# Patient Record
Sex: Female | Born: 1955 | Race: White | Hispanic: No | Marital: Married | Smoking: Former smoker
Health system: Southern US, Community
[De-identification: ages and names within clinical notes are randomized; demographics above are authoritative.]

## PROBLEM LIST (undated history)

## (undated) DIAGNOSIS — C801 Malignant (primary) neoplasm, unspecified: Secondary | ICD-10-CM

## (undated) DIAGNOSIS — Z923 Personal history of irradiation: Secondary | ICD-10-CM

## (undated) DIAGNOSIS — R002 Palpitations: Secondary | ICD-10-CM

## (undated) DIAGNOSIS — R21 Rash and other nonspecific skin eruption: Secondary | ICD-10-CM

## (undated) DIAGNOSIS — Z860100 Personal history of colon polyps, unspecified: Secondary | ICD-10-CM

## (undated) DIAGNOSIS — Z98811 Dental restoration status: Secondary | ICD-10-CM

## (undated) DIAGNOSIS — Z803 Family history of malignant neoplasm of breast: Secondary | ICD-10-CM

## (undated) DIAGNOSIS — E785 Hyperlipidemia, unspecified: Secondary | ICD-10-CM

## (undated) DIAGNOSIS — K7460 Unspecified cirrhosis of liver: Secondary | ICD-10-CM

## (undated) DIAGNOSIS — Z9109 Other allergy status, other than to drugs and biological substances: Secondary | ICD-10-CM

## (undated) DIAGNOSIS — R079 Chest pain, unspecified: Secondary | ICD-10-CM

## (undated) DIAGNOSIS — C50919 Malignant neoplasm of unspecified site of unspecified female breast: Secondary | ICD-10-CM

## (undated) DIAGNOSIS — K219 Gastro-esophageal reflux disease without esophagitis: Secondary | ICD-10-CM

## (undated) DIAGNOSIS — G8929 Other chronic pain: Secondary | ICD-10-CM

## (undated) DIAGNOSIS — K449 Diaphragmatic hernia without obstruction or gangrene: Secondary | ICD-10-CM

## (undated) DIAGNOSIS — R51 Headache: Secondary | ICD-10-CM

## (undated) DIAGNOSIS — M8580 Other specified disorders of bone density and structure, unspecified site: Secondary | ICD-10-CM

## (undated) DIAGNOSIS — M7989 Other specified soft tissue disorders: Secondary | ICD-10-CM

## (undated) DIAGNOSIS — E039 Hypothyroidism, unspecified: Secondary | ICD-10-CM

## (undated) DIAGNOSIS — F32A Depression, unspecified: Secondary | ICD-10-CM

## (undated) DIAGNOSIS — F329 Major depressive disorder, single episode, unspecified: Secondary | ICD-10-CM

## (undated) DIAGNOSIS — D693 Immune thrombocytopenic purpura: Secondary | ICD-10-CM

## (undated) DIAGNOSIS — T7840XA Allergy, unspecified, initial encounter: Secondary | ICD-10-CM

## (undated) DIAGNOSIS — K760 Fatty (change of) liver, not elsewhere classified: Secondary | ICD-10-CM

## (undated) DIAGNOSIS — Z808 Family history of malignant neoplasm of other organs or systems: Secondary | ICD-10-CM

## (undated) DIAGNOSIS — J309 Allergic rhinitis, unspecified: Secondary | ICD-10-CM

## (undated) DIAGNOSIS — Z8669 Personal history of other diseases of the nervous system and sense organs: Secondary | ICD-10-CM

## (undated) DIAGNOSIS — Z8719 Personal history of other diseases of the digestive system: Secondary | ICD-10-CM

## (undated) DIAGNOSIS — F419 Anxiety disorder, unspecified: Secondary | ICD-10-CM

## (undated) DIAGNOSIS — Z91018 Allergy to other foods: Secondary | ICD-10-CM

## (undated) DIAGNOSIS — E079 Disorder of thyroid, unspecified: Secondary | ICD-10-CM

## (undated) DIAGNOSIS — R519 Headache, unspecified: Secondary | ICD-10-CM

## (undated) DIAGNOSIS — R011 Cardiac murmur, unspecified: Secondary | ICD-10-CM

## (undated) DIAGNOSIS — M810 Age-related osteoporosis without current pathological fracture: Secondary | ICD-10-CM

## (undated) DIAGNOSIS — C50911 Malignant neoplasm of unspecified site of right female breast: Secondary | ICD-10-CM

## (undated) HISTORY — DX: Other chronic pain: G89.29

## (undated) HISTORY — DX: Palpitations: R00.2

## (undated) HISTORY — DX: Hypothyroidism, unspecified: E03.9

## (undated) HISTORY — DX: Diaphragmatic hernia without obstruction or gangrene: K44.9

## (undated) HISTORY — DX: Age-related osteoporosis without current pathological fracture: M81.0

## (undated) HISTORY — DX: Gastro-esophageal reflux disease without esophagitis: K21.9

## (undated) HISTORY — DX: Other specified soft tissue disorders: M79.89

## (undated) HISTORY — DX: Major depressive disorder, single episode, unspecified: F32.9

## (undated) HISTORY — PX: ESOPHAGOGASTRODUODENOSCOPY: SHX1529

## (undated) HISTORY — PX: COLONOSCOPY: SHX174

## (undated) HISTORY — DX: Allergy, unspecified, initial encounter: T78.40XA

## (undated) HISTORY — DX: Hyperlipidemia, unspecified: E78.5

## (undated) HISTORY — DX: Depression, unspecified: F32.A

## (undated) HISTORY — DX: Family history of malignant neoplasm of other organs or systems: Z80.8

## (undated) HISTORY — DX: Malignant (primary) neoplasm, unspecified: C80.1

## (undated) HISTORY — DX: Anxiety disorder, unspecified: F41.9

## (undated) HISTORY — DX: Disorder of thyroid, unspecified: E07.9

## (undated) HISTORY — DX: Immune thrombocytopenic purpura: D69.3

## (undated) HISTORY — DX: Headache: R51

## (undated) HISTORY — DX: Chest pain, unspecified: R07.9

## (undated) HISTORY — DX: Family history of malignant neoplasm of breast: Z80.3

## (undated) HISTORY — DX: Personal history of irradiation: Z92.3

## (undated) HISTORY — PX: COLONOSCOPY W/ POLYPECTOMY: SHX1380

## (undated) HISTORY — DX: Unspecified cirrhosis of liver: K74.60

## (undated) HISTORY — DX: Cardiac murmur, unspecified: R01.1

## (undated) HISTORY — DX: Other specified disorders of bone density and structure, unspecified site: M85.80

## (undated) HISTORY — DX: Allergy to other foods: Z91.018

## (undated) HISTORY — DX: Fatty (change of) liver, not elsewhere classified: K76.0

## (undated) HISTORY — DX: Allergic rhinitis, unspecified: J30.9

## (undated) HISTORY — DX: Headache, unspecified: R51.9

---

## 1997-08-29 ENCOUNTER — Other Ambulatory Visit: Admission: RE | Admit: 1997-08-29 | Discharge: 1997-08-29 | Payer: Self-pay | Admitting: Obstetrics and Gynecology

## 1998-12-19 ENCOUNTER — Other Ambulatory Visit: Admission: RE | Admit: 1998-12-19 | Discharge: 1998-12-19 | Payer: Self-pay | Admitting: Nurse Practitioner

## 1998-12-26 ENCOUNTER — Encounter: Admission: RE | Admit: 1998-12-26 | Discharge: 1998-12-26 | Payer: Self-pay | Admitting: Internal Medicine

## 2000-06-09 ENCOUNTER — Other Ambulatory Visit: Admission: RE | Admit: 2000-06-09 | Discharge: 2000-06-09 | Payer: Self-pay | Admitting: Obstetrics and Gynecology

## 2000-07-01 ENCOUNTER — Encounter (INDEPENDENT_AMBULATORY_CARE_PROVIDER_SITE_OTHER): Payer: Self-pay

## 2000-07-01 ENCOUNTER — Other Ambulatory Visit: Admission: RE | Admit: 2000-07-01 | Discharge: 2000-07-01 | Payer: Self-pay | Admitting: *Deleted

## 2000-12-08 ENCOUNTER — Encounter: Payer: Self-pay | Admitting: *Deleted

## 2000-12-08 ENCOUNTER — Encounter: Admission: RE | Admit: 2000-12-08 | Discharge: 2000-12-08 | Payer: Self-pay | Admitting: *Deleted

## 2000-12-23 ENCOUNTER — Encounter: Admission: RE | Admit: 2000-12-23 | Discharge: 2000-12-23 | Payer: Self-pay | Admitting: *Deleted

## 2000-12-23 ENCOUNTER — Encounter: Payer: Self-pay | Admitting: *Deleted

## 2002-07-24 ENCOUNTER — Other Ambulatory Visit: Admission: RE | Admit: 2002-07-24 | Discharge: 2002-07-24 | Payer: Self-pay | Admitting: *Deleted

## 2002-07-26 ENCOUNTER — Encounter: Admission: RE | Admit: 2002-07-26 | Discharge: 2002-07-26 | Payer: Self-pay | Admitting: *Deleted

## 2002-07-26 ENCOUNTER — Encounter: Payer: Self-pay | Admitting: *Deleted

## 2003-10-08 ENCOUNTER — Encounter: Admission: RE | Admit: 2003-10-08 | Discharge: 2003-10-08 | Payer: Self-pay | Admitting: Obstetrics and Gynecology

## 2004-07-29 ENCOUNTER — Ambulatory Visit: Payer: Self-pay | Admitting: Gastroenterology

## 2004-07-29 ENCOUNTER — Encounter: Payer: Self-pay | Admitting: Gastroenterology

## 2004-08-05 ENCOUNTER — Ambulatory Visit: Payer: Self-pay | Admitting: Gastroenterology

## 2004-08-05 ENCOUNTER — Encounter (INDEPENDENT_AMBULATORY_CARE_PROVIDER_SITE_OTHER): Payer: Self-pay | Admitting: Specialist

## 2004-11-19 ENCOUNTER — Encounter: Admission: RE | Admit: 2004-11-19 | Discharge: 2004-11-19 | Payer: Self-pay | Admitting: Obstetrics and Gynecology

## 2005-03-12 ENCOUNTER — Emergency Department (HOSPITAL_COMMUNITY): Admission: EM | Admit: 2005-03-12 | Discharge: 2005-03-12 | Payer: Self-pay | Admitting: Emergency Medicine

## 2006-02-11 ENCOUNTER — Emergency Department (HOSPITAL_COMMUNITY): Admission: EM | Admit: 2006-02-11 | Discharge: 2006-02-11 | Payer: Self-pay | Admitting: Emergency Medicine

## 2006-10-14 ENCOUNTER — Encounter: Admission: RE | Admit: 2006-10-14 | Discharge: 2006-10-14 | Payer: Self-pay | Admitting: Obstetrics and Gynecology

## 2007-01-02 ENCOUNTER — Ambulatory Visit: Payer: Self-pay | Admitting: Oncology

## 2007-04-05 ENCOUNTER — Ambulatory Visit: Payer: Self-pay | Admitting: Oncology

## 2008-01-12 ENCOUNTER — Encounter: Admission: RE | Admit: 2008-01-12 | Discharge: 2008-01-12 | Payer: Self-pay | Admitting: Obstetrics and Gynecology

## 2008-03-21 ENCOUNTER — Encounter: Admission: RE | Admit: 2008-03-21 | Discharge: 2008-03-21 | Payer: Self-pay | Admitting: Occupational Medicine

## 2008-08-27 ENCOUNTER — Telehealth: Payer: Self-pay | Admitting: Gastroenterology

## 2008-08-30 LAB — BASIC METABOLIC PANEL
BUN: 12 mg/dL (ref 4–21)
Glucose: 99 mg/dL
Potassium: 4.1 mmol/L (ref 3.4–5.3)
Sodium: 138 mmol/L (ref 137–147)

## 2008-09-09 ENCOUNTER — Encounter: Admission: RE | Admit: 2008-09-09 | Discharge: 2008-09-09 | Payer: Self-pay | Admitting: Family Medicine

## 2008-10-02 ENCOUNTER — Ambulatory Visit: Payer: Self-pay | Admitting: Gastroenterology

## 2008-10-02 DIAGNOSIS — K219 Gastro-esophageal reflux disease without esophagitis: Secondary | ICD-10-CM | POA: Insufficient documentation

## 2008-10-02 DIAGNOSIS — R1319 Other dysphagia: Secondary | ICD-10-CM | POA: Insufficient documentation

## 2009-02-20 ENCOUNTER — Observation Stay (HOSPITAL_COMMUNITY): Admission: EM | Admit: 2009-02-20 | Discharge: 2009-02-21 | Payer: Self-pay | Admitting: Emergency Medicine

## 2009-03-03 ENCOUNTER — Encounter (INDEPENDENT_AMBULATORY_CARE_PROVIDER_SITE_OTHER): Payer: Self-pay | Admitting: Cardiovascular Disease

## 2009-03-03 ENCOUNTER — Encounter: Admission: RE | Admit: 2009-03-03 | Discharge: 2009-03-03 | Payer: Self-pay | Admitting: Obstetrics and Gynecology

## 2010-04-04 ENCOUNTER — Encounter: Payer: Self-pay | Admitting: Obstetrics and Gynecology

## 2010-04-06 ENCOUNTER — Encounter: Payer: Self-pay | Admitting: Family Medicine

## 2010-05-15 ENCOUNTER — Other Ambulatory Visit: Payer: Self-pay | Admitting: Obstetrics

## 2010-05-15 DIAGNOSIS — Z1231 Encounter for screening mammogram for malignant neoplasm of breast: Secondary | ICD-10-CM

## 2010-05-20 ENCOUNTER — Ambulatory Visit
Admission: RE | Admit: 2010-05-20 | Discharge: 2010-05-20 | Disposition: A | Discharge status: Home / Self Care | Payer: BC Managed Care – PPO | Source: Ambulatory Visit | Attending: Obstetrics | Admitting: Obstetrics

## 2010-05-20 DIAGNOSIS — Z1231 Encounter for screening mammogram for malignant neoplasm of breast: Secondary | ICD-10-CM

## 2010-06-02 ENCOUNTER — Encounter: Payer: Self-pay | Admitting: Family Medicine

## 2010-06-16 LAB — PROTIME-INR: Prothrombin Time: 13.7 seconds (ref 11.6–15.2)

## 2010-06-16 LAB — URINALYSIS, ROUTINE W REFLEX MICROSCOPIC
Hgb urine dipstick: NEGATIVE
Ketones, ur: NEGATIVE mg/dL
Nitrite: NEGATIVE
Specific Gravity, Urine: 1.01 (ref 1.005–1.030)
Urobilinogen, UA: 0.2 mg/dL (ref 0.0–1.0)
pH: 6.5 (ref 5.0–8.0)

## 2010-06-16 LAB — CARDIAC PANEL(CRET KIN+CKTOT+MB+TROPI)
CK, MB: 1.3 ng/mL (ref 0.3–4.0)
Relative Index: INVALID (ref 0.0–2.5)
Relative Index: INVALID (ref 0.0–2.5)
Relative Index: INVALID (ref 0.0–2.5)
Total CK: 46 U/L (ref 7–177)
Total CK: 46 U/L (ref 7–177)
Total CK: 66 U/L (ref 7–177)
Troponin I: 0.01 ng/mL (ref 0.00–0.06)
Troponin I: 0.02 ng/mL (ref 0.00–0.06)

## 2010-06-16 LAB — BASIC METABOLIC PANEL
CO2: 24 mEq/L (ref 19–32)
Calcium: 9 mg/dL (ref 8.4–10.5)
Creatinine, Ser: 0.93 mg/dL (ref 0.4–1.2)
GFR calc Af Amer: >60 mL/min (ref >60)
GFR calc non Af Amer: >60 mL/min (ref >60)
Glucose, Bld: 107 mg/dL — ABNORMAL HIGH (ref 70–99)
Sodium: 139 mEq/L (ref 135–145)

## 2010-06-16 LAB — CBC
Hemoglobin: 13.3 g/dL (ref 12.0–15.0)
Hemoglobin: 14.8 g/dL (ref 12.0–15.0)
MCHC: 34.6 g/dL (ref 30.0–36.0)
MCHC: 34.8 g/dL (ref 30.0–36.0)
MCV: 94.4 fL (ref 78.0–100.0)
MCV: 94.7 fL (ref 78.0–100.0)
RBC: 4.04 MIL/uL (ref 3.87–5.11)
RBC: 4.53 MIL/uL (ref 3.87–5.11)
RDW: 13.8 % (ref 11.5–15.5)
WBC: 13.3 10*3/uL — ABNORMAL HIGH (ref 4.0–10.5)

## 2010-06-16 LAB — COMPREHENSIVE METABOLIC PANEL
ALT: 52 U/L — ABNORMAL HIGH (ref 0–35)
Alkaline Phosphatase: 106 U/L (ref 39–117)
BUN: 7 mg/dL (ref 6–23)
CO2: 23 mEq/L (ref 19–32)
Calcium: 10.1 mg/dL (ref 8.4–10.5)
Chloride: 104 mEq/L (ref 96–112)
GFR calc Af Amer: >60 mL/min (ref >60)
Total Bilirubin: 0.8 mg/dL (ref 0.3–1.2)

## 2010-06-16 LAB — CK TOTAL AND CKMB (NOT AT ARMC)
CK, MB: 2.8 ng/mL (ref 0.3–4.0)
Total CK: 74 U/L (ref 7–177)

## 2010-06-16 LAB — DIFFERENTIAL
Monocytes Absolute: 0.9 10*3/uL (ref 0.1–1.0)
Monocytes Relative: 6 % (ref 3–12)
Neutrophils Relative %: 76 % (ref 43–77)

## 2010-06-16 LAB — LIPID PANEL
Total CHOL/HDL Ratio: 4.5 RATIO
VLDL: 22 mg/dL (ref 0–40)

## 2010-06-16 LAB — TROPONIN I: Troponin I: 0.01 ng/mL (ref 0.00–0.06)

## 2010-07-03 ENCOUNTER — Encounter: Payer: Self-pay | Admitting: Cardiology

## 2010-07-03 ENCOUNTER — Encounter: Payer: Self-pay | Admitting: *Deleted

## 2010-07-06 ENCOUNTER — Ambulatory Visit (INDEPENDENT_AMBULATORY_CARE_PROVIDER_SITE_OTHER): Payer: BC Managed Care – PPO | Admitting: Cardiology

## 2010-07-06 ENCOUNTER — Encounter: Payer: Self-pay | Admitting: Cardiology

## 2010-07-06 ENCOUNTER — Other Ambulatory Visit: Payer: Self-pay | Admitting: Obstetrics

## 2010-07-06 DIAGNOSIS — R002 Palpitations: Secondary | ICD-10-CM | POA: Insufficient documentation

## 2010-07-06 DIAGNOSIS — R079 Chest pain, unspecified: Secondary | ICD-10-CM | POA: Insufficient documentation

## 2010-07-06 NOTE — Assessment & Plan Note (Signed)
The symptoms are not particularly bothersome. We will consider a CardioNet in the future if her symptoms worsen.

## 2010-07-06 NOTE — Assessment & Plan Note (Signed)
Symptoms atypical. They may be secondary to reflux. Electrocardiogram normal. Stress Myoview in December of 2010 normal. I do not think further ischemia evaluation is necessary at this point. If they persist we could consider a stress echocardiogram in the future.

## 2010-07-06 NOTE — Progress Notes (Signed)
HPI: 55 year old female with evaluation of chest pain. Previous Myoview in December of 2010 showed ejection fraction of 65% and no ischemia. Patient states that approximately one month ago she was having episodes of chest pain. It was described as a heaviness. It did not radiate. There were no associated symptoms. It was not clinic, positional but there was a question of whether it was related to food. It lasts intermittently for a week at a time. It improved with activities. She has had no symptoms in the past 3-4 weeks. She also feels an occasional "skip" but no sustained palpitations and she has not had syncope. She has mild dyspnea on exertion but no PND. Occasional mild pedal edema. Because of the above we were asked to further evaluate.  Current Outpatient Prescriptions  Medication Sig Dispense Refill  . Calcium-Vitamin D (CALTRATE 600 PLUS-VIT D PO) Take by mouth.        . Cholecalciferol (VITAMIN D-3 PO) Take 5,000 Units by mouth.        . pantoprazole (PROTONIX) 40 MG tablet Take 40 mg by mouth 2 (two) times daily.        Marland Kitchen DISCONTD: glucosamine-chondroitin 500-400 MG tablet Take 1 tablet by mouth daily.        Marland Kitchen DISCONTD: Multiple Vitamin (MULTIVITAMIN) capsule Take 1 capsule by mouth daily.        Marland Kitchen DISCONTD: Omega-3 Fatty Acids (FISH OIL CONCENTRATE PO) 1 tab po qd         Allergies  Allergen Reactions  . Penicillins   . Tramadol     Past Medical History  Diagnosis Date  . GERD (gastroesophageal reflux disease)   . Depression   . Rhinitis, allergic   . Esophageal stricture   . Hiatal hernia     No past surgical history on file.  History   Social History  . Marital Status: Married    Spouse Name: N/A    Number of Children: 4  . Years of Education: N/A   Occupational History  .      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 Smoking status: Former Games developer  . Smokeless tobacco: Not on file   Comment: quit 2004  . Alcohol Use: No  . Drug Use:  No  . Sexually Active: Not on file   Other Topics Concern  . Not on file   Social History Narrative  . No narrative on file    Family History  Problem Relation Age of Onset  . Breast cancer    . Liver disease      ROS: no fevers or chills, productive cough, hemoptysis, dysphasia, odynophagia, melena, hematochezia, dysuria, hematuria, rash, seizure activity, orthopnea, PND, pedal edema, claudication. Remaining systems are negative.  Physical Exam: General:  Well developed/obese in NAD Skin warm/dry Patient not depressed No peripheral clubbing Back-normal HEENT-normal/normal eyelids Neck supple/normal carotid upstroke bilaterally; no bruits; no JVD; no thyromegaly chest - CTA/ normal expansion CV - RRR/normal S1 and S2; no murmurs, rubs or gallops;  PMI nondisplaced Abdomen -NT/ND, no HSM, no mass, + bowel sounds, no bruit 2+ femoral pulses, no bruits Ext-no edema, chords, 2+ DP Neuro-grossly nonfocal  ECG 05/29/10 NSR with no ST changes

## 2010-07-09 ENCOUNTER — Other Ambulatory Visit: Payer: Self-pay | Admitting: Obstetrics

## 2010-07-09 DIAGNOSIS — N951 Menopausal and female climacteric states: Secondary | ICD-10-CM

## 2010-07-30 ENCOUNTER — Encounter: Payer: BC Managed Care – PPO | Attending: Obstetrics | Admitting: *Deleted

## 2010-07-30 DIAGNOSIS — E669 Obesity, unspecified: Secondary | ICD-10-CM | POA: Insufficient documentation

## 2010-07-30 DIAGNOSIS — Z713 Dietary counseling and surveillance: Secondary | ICD-10-CM | POA: Insufficient documentation

## 2010-08-17 ENCOUNTER — Ambulatory Visit
Admission: RE | Admit: 2010-08-17 | Discharge: 2010-08-17 | Disposition: A | Discharge status: Home / Self Care | Payer: BC Managed Care – PPO | Source: Ambulatory Visit | Attending: Obstetrics | Admitting: Obstetrics

## 2010-08-17 DIAGNOSIS — N951 Menopausal and female climacteric states: Secondary | ICD-10-CM

## 2010-08-24 ENCOUNTER — Encounter: Payer: Self-pay | Admitting: Cardiology

## 2011-03-16 DIAGNOSIS — Z923 Personal history of irradiation: Secondary | ICD-10-CM

## 2011-03-16 HISTORY — PX: MASTECTOMY: SHX3

## 2011-03-16 HISTORY — PX: BREAST LUMPECTOMY: SHX2

## 2011-03-16 HISTORY — DX: Personal history of irradiation: Z92.3

## 2011-08-31 ENCOUNTER — Other Ambulatory Visit: Payer: Self-pay | Admitting: Family Medicine

## 2011-08-31 DIAGNOSIS — R7989 Other specified abnormal findings of blood chemistry: Secondary | ICD-10-CM

## 2011-09-03 ENCOUNTER — Other Ambulatory Visit: Payer: BC Managed Care – PPO

## 2011-09-03 ENCOUNTER — Ambulatory Visit
Admission: RE | Admit: 2011-09-03 | Discharge: 2011-09-03 | Disposition: A | Discharge status: Home / Self Care | Payer: BC Managed Care – PPO | Source: Ambulatory Visit | Attending: Family Medicine | Admitting: Family Medicine

## 2011-09-03 DIAGNOSIS — R7989 Other specified abnormal findings of blood chemistry: Secondary | ICD-10-CM

## 2011-09-23 ENCOUNTER — Encounter: Payer: Self-pay | Admitting: Gastroenterology

## 2011-10-22 ENCOUNTER — Ambulatory Visit (INDEPENDENT_AMBULATORY_CARE_PROVIDER_SITE_OTHER): Payer: BC Managed Care – PPO | Admitting: Gastroenterology

## 2011-10-22 ENCOUNTER — Encounter: Payer: Self-pay | Admitting: Gastroenterology

## 2011-10-22 VITALS — BP 112/78 | HR 76 | Ht 64.5 in | Wt 231.0 lb

## 2011-10-22 DIAGNOSIS — Z1211 Encounter for screening for malignant neoplasm of colon: Secondary | ICD-10-CM

## 2011-10-22 DIAGNOSIS — K219 Gastro-esophageal reflux disease without esophagitis: Secondary | ICD-10-CM

## 2011-10-22 MED ORDER — MOVIPREP 100 G PO SOLR: 1.0000 | Freq: Once | ORAL | Status: DC

## 2011-10-22 NOTE — Patient Instructions (Addendum)
You have been scheduled for an endoscopy and colonoscopy with propofol. Please follow the written instructions given to you at your visit today. Please pick up your prep at the pharmacy within the next 1-3 days. If you use inhalers (even only as needed), please bring them with you on the day of your procedure. cc: Lynnea Ferrier, MD

## 2011-10-22 NOTE — Progress Notes (Signed)
History of Present Illness: This is a 56 year old female who has chronic GERD. Her symptoms are generally well controlled on pantoprazole 40 mg twice daily. When tries to decrease to pantoprazole once daily her symptoms flare. She previously underwent endoscopy in 2006 with an esophageal stricture dilated and hiatal hernia noted. Her father was diagnosed with esophageal cancer and Barrett's esophagus and passed away earlier this year. She is very concerned about Barrett's esophagus and adequate management of GERD. Denies weight loss, abdominal pain, constipation, diarrhea, change in stool caliber, melena, hematochezia, nausea, vomiting, dysphagia, chest pain.  Review of Systems: Pertinent positive and negative review of systems were noted in the above HPI section. All other review of systems were otherwise negative.  Current Medications, Allergies, Past Medical History, Past Surgical History, Family History and Social History were reviewed in [facility] electronic medical record.  Physical Exam: General: Well developed , well nourished, no acute distress Head: Normocephalic and atraumatic Eyes:  sclerae anicteric, EOMI Ears: Normal auditory acuity Mouth: No deformity or lesions Neck: Supple, no masses or thyromegaly Lungs: Clear throughout to auscultation Heart: Regular rate and rhythm; no murmurs, rubs or bruits Abdomen: Soft, non tender and non distended. No masses, hepatosplenomegaly or hernias noted. Normal Bowel sounds Rectal: Deferred to colonoscopy Musculoskeletal: Symmetrical with no gross deformities  Skin: No lesions on visible extremities Pulses:  Normal pulses noted Extremities: No clubbing, cyanosis, edema or deformities noted Neurological: Alert oriented x 4, grossly nonfocal Cervical Nodes:  No significant cervical adenopathy Inguinal Nodes: No significant inguinal adenopathy Psychological:  Alert and cooperative. Anxious  Assessment and Recommendations:  1. Chronic  GERD with a history of a peptic stricture. Continue standard antireflux measures and pantoprazole 40 mg twice daily. We discussed potential long-term risks of PPI therapy including possible osteopenia or osteoporosis and she will maintain followup with bone screenings her primary physician. Continue pantoprazole 40 mg twice daily. She would like to proceed with another endoscopy to evaluate for any acid-related damage or signs of Barrett's. The risks, benefits, and alternatives to endoscopy with possible biopsy and possible dilation were discussed with the patient and they consent to proceed.   2. Colorectal cancer screening, average risk. The risks, benefits, and alternatives to colonoscopy with possible biopsy and possible polypectomy were discussed with the patient and they consent to proceed.

## 2011-10-25 ENCOUNTER — Encounter: Payer: Self-pay | Admitting: Gastroenterology

## 2011-11-18 ENCOUNTER — Other Ambulatory Visit: Payer: Self-pay | Admitting: Obstetrics

## 2011-11-18 DIAGNOSIS — Z1231 Encounter for screening mammogram for malignant neoplasm of breast: Secondary | ICD-10-CM

## 2011-12-02 ENCOUNTER — Ambulatory Visit
Admission: RE | Admit: 2011-12-02 | Discharge: 2011-12-02 | Disposition: A | Discharge status: Home / Self Care | Payer: BC Managed Care – PPO | Source: Ambulatory Visit | Attending: Obstetrics | Admitting: Obstetrics

## 2011-12-02 DIAGNOSIS — Z1231 Encounter for screening mammogram for malignant neoplasm of breast: Secondary | ICD-10-CM

## 2011-12-06 ENCOUNTER — Other Ambulatory Visit: Payer: Self-pay | Admitting: Obstetrics

## 2011-12-06 DIAGNOSIS — R928 Other abnormal and inconclusive findings on diagnostic imaging of breast: Secondary | ICD-10-CM

## 2011-12-10 ENCOUNTER — Ambulatory Visit
Admission: RE | Admit: 2011-12-10 | Discharge: 2011-12-10 | Disposition: A | Discharge status: Home / Self Care | Payer: BC Managed Care – PPO | Source: Ambulatory Visit | Attending: Obstetrics | Admitting: Obstetrics

## 2011-12-10 ENCOUNTER — Other Ambulatory Visit: Payer: Self-pay | Admitting: Obstetrics

## 2011-12-10 DIAGNOSIS — R928 Other abnormal and inconclusive findings on diagnostic imaging of breast: Secondary | ICD-10-CM

## 2011-12-14 ENCOUNTER — Encounter: Payer: Self-pay | Admitting: Gastroenterology

## 2011-12-14 ENCOUNTER — Ambulatory Visit (AMBULATORY_SURGERY_CENTER): Payer: BC Managed Care – PPO | Admitting: Gastroenterology

## 2011-12-14 VITALS — HR 83 | Temp 97.5°F | Resp 16 | Ht 64.5 in | Wt 231.0 lb

## 2011-12-14 DIAGNOSIS — D126 Benign neoplasm of colon, unspecified: Secondary | ICD-10-CM

## 2011-12-14 DIAGNOSIS — Z1211 Encounter for screening for malignant neoplasm of colon: Secondary | ICD-10-CM

## 2011-12-14 DIAGNOSIS — K219 Gastro-esophageal reflux disease without esophagitis: Secondary | ICD-10-CM

## 2011-12-14 MED ORDER — SODIUM CHLORIDE 0.9 % IV SOLN: 500.0000 mL | INTRAVENOUS | Status: DC

## 2011-12-14 NOTE — Progress Notes (Signed)
Patient did not have preoperative order for IV antibiotic SSI prophylaxis. ([id number])   

## 2011-12-14 NOTE — Op Note (Signed)
[facility] [address] [place], [address]   COLONOSCOPY PROCEDURE REPORT  PATIENT: Amanda Hoover, Amanda Hoover  MR#: [id number] BIRTHDATE: 09-22-1955 , 55  yrs. old GENDER: Female ENDOSCOPIST: Meryl Dare, MD, [facility]  PROCEDURE DATE:  12/14/2011 PROCEDURE:   Colonoscopy with snare polypectomy and Colonoscopy with biopsy ASA CLASS:   Class II INDICATIONS:average risk screening. MEDICATIONS: MAC sedation, administered by CRNA and propofol (Diprivan) 250mg  IV DESCRIPTION OF PROCEDURE:   After the risks benefits and alternatives of the procedure were thoroughly explained, informed consent was obtained.  A digital rectal exam revealed no abnormalities of the rectum.   The LB CF-H180AL [id number]  endoscope was introduced through the anus and advanced to the cecum, which was identified by both the appendix and ileocecal valve. No adverse events experienced.   The quality of the prep was excellent, using MoviPrep  The instrument was then slowly withdrawn as the colon was fully examined.   COLON FINDINGS: Two sessile polyps measuring 6 mm in size were found in the transverse colon.  A polypectomy was performed with a cold snare.  The resection was complete and the polyp tissue was partially retrieved.   A sessile polyp measuring 4 mm in size was found in the descending colon.  A polypectomy was performed with cold forceps.  The resection was complete and the polyp tissue was completely retrieved.   The colon was otherwise normal.  There was no diverticulosis, inflammation, polyps or cancers unless previously stated.  Retroflexed views revealed no abnormalities. The time to cecum=2 minutes 31 seconds.  Withdrawal time=9 minutes 12 seconds.  The scope was withdrawn and the procedure completed. COMPLICATIONS: There were no complications.  ENDOSCOPIC IMPRESSION: 1.   Two sessile polyps in the transverse colon; polypectomy was performed 2.   Sessile polyp in the descending  colon; polypectomy was performed  RECOMMENDATIONS: 1.  await pathology results 2.  Repeat colonoscopy in 5 years if polyp adenomatous; otherwise 10 years   eSigned:  Meryl Dare, MD, [facility] 12/14/2011 9:19 AM   cc: Lynnea Ferrier, MD

## 2011-12-14 NOTE — Op Note (Signed)
[facility] [address] [place], [address]   ENDOSCOPY PROCEDURE REPORT  PATIENT: Amanda Hoover, Amanda Hoover  MR#: [id number] BIRTHDATE: [birth year] , 55  yrs. old GENDER: Female ENDOSCOPIST: Meryl Dare, MD, [facility]  PROCEDURE DATE:  12/14/2011 PROCEDURE:  EGD w/ biopsy ASA CLASS:     Class II INDICATIONS:  history of esophageal reflux. MEDICATIONS: MAC sedation, administered by CRNA, propofol (Diprivan) 200mg  IV, and there was residual sedation effect present from prior procedure. TOPICAL ANESTHETIC: none DESCRIPTION OF PROCEDURE: After the risks benefits and alternatives of the procedure were thoroughly explained, informed consent was obtained.  The [facility] GIF-H180 [id number] endoscope was introduced through the mouth and advanced to the second portion of the duodenum. Without limitations.  The instrument was slowly withdrawn as the mucosa was fully examined.   ESOPHAGUS: An irregular Z-line was observed.  Multiple biopsies were performed.   The esophagus was otherwise normal. STOMACH: The mucosa of the stomach appeared normal.  The gastric folds appeared normal. DUODENUM: The duodenal mucosa showed no abnormalities in the bulb and second portion of the duodenum.  Retroflexed views revealed a small hiatal hernia.     The scope was then withdrawn from the patient and the procedure completed.  COMPLICATIONS: There were no complications.  ENDOSCOPIC IMPRESSION: 1.   Irregular Z-line was observed; multiple biopsies 2.   Small hiatal hernia   RECOMMENDATIONS: 1.  await pathology results 2.  anti-reflux regimen 3.  continue PPI    eSigned:  Meryl Dare, MD, [facility] 12/14/2011 9:30 AM   XB:JYNWGN Tanya Nones, MD

## 2011-12-14 NOTE — Patient Instructions (Addendum)

## 2011-12-15 ENCOUNTER — Telehealth: Payer: Self-pay | Admitting: *Deleted

## 2011-12-15 NOTE — Telephone Encounter (Signed)
No answer, message left for the patient. 

## 2011-12-17 ENCOUNTER — Other Ambulatory Visit: Payer: Self-pay | Admitting: Obstetrics

## 2011-12-17 ENCOUNTER — Ambulatory Visit
Admission: RE | Admit: 2011-12-17 | Discharge: 2011-12-17 | Disposition: A | Discharge status: Home / Self Care | Payer: BC Managed Care – PPO | Source: Ambulatory Visit | Attending: Obstetrics | Admitting: Obstetrics

## 2011-12-17 DIAGNOSIS — R928 Other abnormal and inconclusive findings on diagnostic imaging of breast: Secondary | ICD-10-CM

## 2011-12-17 DIAGNOSIS — C50919 Malignant neoplasm of unspecified site of unspecified female breast: Secondary | ICD-10-CM

## 2011-12-17 HISTORY — DX: Malignant neoplasm of unspecified site of unspecified female breast: C50.919

## 2011-12-20 ENCOUNTER — Other Ambulatory Visit: Payer: Self-pay | Admitting: Obstetrics

## 2011-12-20 ENCOUNTER — Ambulatory Visit
Admission: RE | Admit: 2011-12-20 | Discharge: 2011-12-20 | Disposition: A | Discharge status: Home / Self Care | Payer: BC Managed Care – PPO | Source: Ambulatory Visit | Attending: Obstetrics | Admitting: Obstetrics

## 2011-12-20 ENCOUNTER — Encounter: Payer: Self-pay | Admitting: Gastroenterology

## 2011-12-20 DIAGNOSIS — C50911 Malignant neoplasm of unspecified site of right female breast: Secondary | ICD-10-CM

## 2011-12-20 DIAGNOSIS — R928 Other abnormal and inconclusive findings on diagnostic imaging of breast: Secondary | ICD-10-CM

## 2011-12-23 ENCOUNTER — Telehealth: Payer: Self-pay | Admitting: *Deleted

## 2011-12-23 DIAGNOSIS — C50311 Malignant neoplasm of lower-inner quadrant of right female breast: Secondary | ICD-10-CM | POA: Insufficient documentation

## 2011-12-23 DIAGNOSIS — C50319 Malignant neoplasm of lower-inner quadrant of unspecified female breast: Secondary | ICD-10-CM

## 2011-12-23 NOTE — Telephone Encounter (Signed)
Confirmed [facility] for 12/29/11 at 1200 .  Instructions and contact information given.  

## 2011-12-24 ENCOUNTER — Ambulatory Visit
Admission: RE | Admit: 2011-12-24 | Discharge: 2011-12-24 | Disposition: A | Discharge status: Home / Self Care | Payer: BC Managed Care – PPO | Source: Ambulatory Visit | Attending: Obstetrics | Admitting: Obstetrics

## 2011-12-24 DIAGNOSIS — C50911 Malignant neoplasm of unspecified site of right female breast: Secondary | ICD-10-CM

## 2011-12-24 MED ORDER — GADOBENATE DIMEGLUMINE 529 MG/ML IV SOLN
20.0000 mL | Freq: Once | INTRAVENOUS | Status: AC | PRN
  Administered 2011-12-24: 20 mL via INTRAVENOUS

## 2011-12-29 ENCOUNTER — Encounter: Payer: Self-pay | Admitting: Radiation Oncology

## 2011-12-29 ENCOUNTER — Ambulatory Visit
Admission: RE | Admit: 2011-12-29 | Discharge: 2011-12-29 | Disposition: A | Discharge status: Home / Self Care | Payer: BC Managed Care – PPO | Source: Ambulatory Visit | Attending: Radiation Oncology | Admitting: Radiation Oncology

## 2011-12-29 ENCOUNTER — Encounter: Payer: Self-pay | Admitting: Oncology

## 2011-12-29 ENCOUNTER — Ambulatory Visit: Payer: BC Managed Care – PPO

## 2011-12-29 ENCOUNTER — Ambulatory Visit: Payer: BC Managed Care – PPO | Attending: General Surgery | Admitting: Physical Therapy

## 2011-12-29 ENCOUNTER — Other Ambulatory Visit (HOSPITAL_BASED_OUTPATIENT_CLINIC_OR_DEPARTMENT_OTHER): Payer: BC Managed Care – PPO

## 2011-12-29 ENCOUNTER — Ambulatory Visit (HOSPITAL_BASED_OUTPATIENT_CLINIC_OR_DEPARTMENT_OTHER): Payer: BC Managed Care – PPO | Admitting: General Surgery

## 2011-12-29 ENCOUNTER — Ambulatory Visit (HOSPITAL_BASED_OUTPATIENT_CLINIC_OR_DEPARTMENT_OTHER): Payer: BC Managed Care – PPO | Admitting: Oncology

## 2011-12-29 ENCOUNTER — Encounter (INDEPENDENT_AMBULATORY_CARE_PROVIDER_SITE_OTHER): Payer: Self-pay | Admitting: General Surgery

## 2011-12-29 ENCOUNTER — Telehealth: Payer: Self-pay | Admitting: Oncology

## 2011-12-29 VITALS — BP 128/84 | HR 67 | Temp 98.3°F | Resp 20 | Ht 64.5 in | Wt 230.0 lb

## 2011-12-29 DIAGNOSIS — M25619 Stiffness of unspecified shoulder, not elsewhere classified: Secondary | ICD-10-CM | POA: Insufficient documentation

## 2011-12-29 DIAGNOSIS — C50919 Malignant neoplasm of unspecified site of unspecified female breast: Secondary | ICD-10-CM | POA: Insufficient documentation

## 2011-12-29 DIAGNOSIS — C50319 Malignant neoplasm of lower-inner quadrant of unspecified female breast: Secondary | ICD-10-CM

## 2011-12-29 DIAGNOSIS — D0590 Unspecified type of carcinoma in situ of unspecified breast: Secondary | ICD-10-CM

## 2011-12-29 DIAGNOSIS — R293 Abnormal posture: Secondary | ICD-10-CM | POA: Insufficient documentation

## 2011-12-29 DIAGNOSIS — IMO0001 Reserved for inherently not codable concepts without codable children: Secondary | ICD-10-CM | POA: Insufficient documentation

## 2011-12-29 LAB — COMPREHENSIVE METABOLIC PANEL (CC13)
ALT: 46 U/L (ref 0–55)
AST: 41 U/L — ABNORMAL HIGH (ref 5–34)
BUN: 11 mg/dL (ref 7.0–26.0)
Creatinine: 0.9 mg/dL (ref 0.6–1.1)
Total Bilirubin: 0.7 mg/dL (ref 0.20–1.20)

## 2011-12-29 LAB — CBC WITH DIFFERENTIAL/PLATELET
BASO%: 0.7 % (ref 0.0–2.0)
Basophils Absolute: 0 10*3/uL (ref 0.0–0.1)
EOS%: 2.8 % (ref 0.0–7.0)
HCT: 41.5 % (ref 34.8–46.6)
LYMPH%: 34.6 % (ref 14.0–49.7)
MCH: 32.2 pg (ref 25.1–34.0)
MCHC: 34.1 g/dL (ref 31.5–36.0)
MCV: 94.5 fL (ref 79.5–101.0)
NEUT%: 53.1 % (ref 38.4–76.8)
Platelets: 154 10*3/uL (ref 145–400)

## 2011-12-29 NOTE — Telephone Encounter (Signed)
gve the pt her dec 2013 appt calendar °

## 2011-12-29 NOTE — Progress Notes (Signed)
Radiation Oncology         [phone] ________________________________  Initial outpatient Consultation  Name: Amanda Hoover MRN: [id number]  Date: 12/29/2011  DOB: 08/03/1955  HY:QMVHQIO,NGEXBM TOM, MD  Ernestene Mention, MD   REFERRING PHYSICIAN: Ernestene Mention, MD  DIAGNOSIS: The encounter diagnosis was Cancer of lower-inner quadrant of female breast.  HISTORY OF PRESENT ILLNESS::Amanda Hoover is a 56 y.o. female who is seen as part of the multidisciplinary breast clinic. Earlier this year on routine screening mammography the patient was noted to have suspicious changes in the lower inner aspect of the right breast.  There was an area of pleomorphic microcalcifications in the lower inner quadrant which was suspicious for possible DCIS. This measured roughly 2 cm in diameter. 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  An MRI was performed which revealed a clumped nodular enhancement extending over 3.7 x 2.5 x 2.4 cm.   PREVIOUS RADIATION THERAPY: No  PAST MEDICAL HISTORY:  has a past medical history of GERD (gastroesophageal reflux disease); Depression; Rhinitis, allergic; Esophageal stricture; Hiatal hernia; Chronic headaches; and Osteopenia.    PAST SURGICAL HISTORY: Past Surgical History  Procedure Date  . Upper gastrointestinal endoscopy     FAMILY HISTORY: family history includes Breast cancer in her paternal aunt, paternal grandmother, and unspecified family member; Cancer in her father, paternal aunt, and paternal grandmother; Crohn's disease in her mother; Diabetes in her maternal grandmother and paternal grandmother; Esophageal cancer in her father; Irritable bowel syndrome in her mother; and Liver disease in an unspecified family member.  SOCIAL HISTORY:  reports that she quit smoking about 11 years ago. She has never used smokeless tobacco. She reports that she does  not drink alcohol or use illicit drugs.  ALLERGIES: Penicillins and Tramadol  MEDICATIONS:  Current Outpatient Prescriptions  Medication Sig Dispense Refill  . Calcium-Vitamin D (CALTRATE 600 PLUS-VIT D PO) Take by mouth.        . Cholecalciferol (VITAMIN D-3 PO) Take 5,000 Units by mouth.        . cyclobenzaprine (FLEXERIL) 10 MG tablet Take 10 mg by mouth as needed.      . DiphenhydrAMINE HCl (BENADRYL PO) Take by mouth as needed.      . pantoprazole (PROTONIX) 40 MG tablet Take 40 mg by mouth 2 (two) times daily.          REVIEW OF SYSTEMS:  A 15 point review of systems is documented in the electronic medical record. This was obtained by the nursing staff. However, I reviewed this with the patient to discuss relevant findings and make appropriate changes.  She denied any pain in the breast area,  nipple discharge or bleeding prior to diagnosis.   PHYSICAL EXAM: In general this is a very pleasant 56 year old female in no acute distress.  She is accompanied by her husband on evaluation today. There is no palpable cervical supraclavicular or axillary adenopathy. The lungs are clear to auscultation. The heart has a regular rhythm and rate. The abdomen is soft and nontender with normal bowel sounds.  Examination left breast reveals no mass or nipple discharge. Examination of the right breast area reveals some bruising in the lower aspect of the breast area. There is no dominant mass appreciated in the breast,  nipple discharge or bleeding.   LABORATORY DATA:  Lab Results  Component Value Date   WBC 5.3 12/29/2011   HGB 14.2 12/29/2011  HCT 41.5 12/29/2011   MCV 94.5 12/29/2011   PLT 154 12/29/2011   Lab Results  Component Value Date   NA 139 02/21/2009   K 3.7 02/21/2009   CL 109 02/21/2009   CO2 24 02/21/2009   Lab Results  Component Value Date   ALT 52* 02/20/2009   AST 39* 02/20/2009   ALKPHOS 106 02/20/2009   BILITOT 0.8 02/20/2009     RADIOGRAPHY: Mr Breast Bilateral W Wo  Contrast  12/24/2011  *RADIOLOGY REPORT*  Clinical Data: Newly-diagnosed right lower inner quadrant DCIS with foci of possible invasion manifesting as mammographically detected calcifications.  BILATERAL BREAST MRI WITH AND WITHOUT CONTRAST  Technique: Multiplanar, multisequence MR images of both breasts were obtained prior to and following the intravenous administration of 20ml of Multihance.  Three dimensional images were evaluated at the independent DynaCad workstation.  Comparison:  Prior mammograms  Findings: Right lower inner quadrant post biopsy changes are noted with clip artifact.  No other abnormal T2-weighted hyperintensity is identified in either breast.  No lymphadenopathy.  Background enhancement pattern is minimal.  In the right lower inner quadrant, surrounding post biopsy change and clip artifact, is an area of segmental clumped nodular enhancement demonstrating predominately plateau type kinetics measuring overall 3.7 x 2.5 x 2.4 cm.  This corresponds to the area of biopsy-proven DCIS.  No other area of abnormal enhancement is seen in either breast.  IMPRESSION: Segmental clumped nodular enhancement measuring 3.7 cm in maximal anteroposterior dimension in the right breast lower inner quadrant at the site of biopsy-proven DCIS.  No other area of abnormal enhancement in either breast to suggest malignancy elsewhere.  RECOMMENDATION: Treatment plan  THREE-DIMENSIONAL MR IMAGE RENDERING ON INDEPENDENT WORKSTATION:  Three-dimensional MR images were rendered by post-processing of the original MR data on an independent workstation.  The three- dimensional MR images were interpreted, and findings were reported in the accompanying complete MRI report for this study.  BI-RADS CATEGORY 6:  Known biopsy-proven malignancy - appropriate action should be taken.   Original Report Authenticated By: Harrel Lemon, M.D.    Mm Breast Stereo Biopsy Right  12/17/2011  *RADIOLOGY REPORT*  Clinical Data:   Suspicious coarse heterogeneous right lower inner quadrant calcifications  STEREOTACTIC-GUIDED VACUUM ASSISTED BIOPSY OF THE RIGHT BREAST AND SPECIMEN RADIOGRAPH  Comparison: Previous exams.  I met with the patient and we discussed the procedure of stereotactic-guided biopsy, including benefits and alternatives. We discussed the high likelihood of a successful procedure. We discussed the risks of the procedure, including infection, bleeding, tissue injury, clip migration, and inadequate sampling. Informed, written consent was given.  Using sterile technique, 2% lidocaine, stereotactic guidance, and a 9 gauge vacuum assisted device, biopsy was performed of right lower inner quadrant calcifications using a inferior to superior approach.  Specimen radiograph was performed, showing calcifications in the biopsy samples.  Specimens with calcifications are identified for pathology.  At the conclusion of the procedure, a T shaped tissue marker clip was deployed into the biopsy cavity.  Follow-up 2-view mammogram confirmed clip placement at the biopsy site.  IMPRESSION:  Stereotactic-guided biopsy of right lower inner quadrant calcifications, with clip placement.  Pathology is pending.  No apparent complications.   Original Report Authenticated By: Harrel Lemon, M.D.    Mm Breast Surgical Specimen  12/17/2011  *RADIOLOGY REPORT*  Clinical Data:  Suspicious coarse heterogeneous right lower inner quadrant calcifications  STEREOTACTIC-GUIDED VACUUM ASSISTED BIOPSY OF THE RIGHT BREAST AND SPECIMEN RADIOGRAPH  Comparison: Previous exams.  I met  with the patient and we discussed the procedure of stereotactic-guided biopsy, including benefits and alternatives. We discussed the high likelihood of a successful procedure. We discussed the risks of the procedure, including infection, bleeding, tissue injury, clip migration, and inadequate sampling. Informed, written consent was given.  Using sterile technique, 2% lidocaine,  stereotactic guidance, and a 9 gauge vacuum assisted device, biopsy was performed of right lower inner quadrant calcifications using a inferior to superior approach.  Specimen radiograph was performed, showing calcifications in the biopsy samples.  Specimens with calcifications are identified for pathology.  At the conclusion of the procedure, a T shaped tissue marker clip was deployed into the biopsy cavity.  Follow-up 2-view mammogram confirmed clip placement at the biopsy site.  IMPRESSION:  Stereotactic-guided biopsy of right lower inner quadrant calcifications, with clip placement.  Pathology is pending.  No apparent complications.   Original Report Authenticated By: Harrel Lemon, M.D.    Mm Digital Diag Ltd R  12/10/2011  *RADIOLOGY REPORT*  Clinical Data:  Recall from screening 3-D mammography.  DIGITAL DIAGNOSTIC RIGHT BREAST MAMMOGRAM  Comparison:  12/02/2011, 05/21/2010, 03/03/2009, 01/12/2008, 10/14/2006.  Findings:  There is a group of pleomorphic microcalcifications located within the lower inner quadrant of the right breast which are suspicious for possible DCIS.  These measure maximally 2.0 cm in diameter.  Tissue sampling is recommended.  I have discussed stereotactic core biopsy of the calcifications with the patient. This will be scheduled per patient preference.  IMPRESSION: A group of suspicious calcifications located within the lower inner quadrant of the right breast as discussed above.  Tissue sampling is recommended and stereotactic core biopsy will be scheduled.  RECOMMENDATION: Right breast stereotactic core biopsy.  BI-RADS CATEGORY 4:  Suspicious abnormality - biopsy should be considered.   Original Report Authenticated By: Rolla Plate, M.D.    Mm Digital Screening  12/03/2011  *RADIOLOGY REPORT*  Clinical Data: Screening.  DIGITAL BILATERAL SCREENING MAMMOGRAM WITH CAD  DIGITAL BREAST TOMOSYNTHESIS  Digital breast tomosynthesis images are acquired in two projections.   These images are reviewed in combination with the digital mammogram, confirming the findings below.  Comparison:  Previous exams.  Findings: Two views of each breast demonstrate heterogeneously dense tissue.  In the right breast, calcifications warrant further evaluation with magnified views.  In the left breast, no mass or malignant type calcifications are identified.  Images were processed with CAD.  IMPRESSION: Further evaluation is suggested for calcifications in the right breast.  RECOMMENDATION: Diagnostic mammogram of the right breast. (Code:FI-R-15M)  BI-RADS CATEGORY 0:  Incomplete.  Need additional imaging evaluation and/or prior mammograms for comparison.   Original Report Authenticated By: Otilio Carpen, M.D.    Mm Radiologist Eval And Mgmt  12/20/2011  *RADIOLOGY REPORT*  ESTABLISHED PATIENT OFFICE VISIT - LEVEL II [phone])  Chief Complaint:   The patient returns for right breast stereotactic biopsy results and biopsy wound check.  History:  56 year old female with suspicious calcification of the right breast.  Stereotactic biopsy performed on 12/17/2011.  Exam:  Ecchymosis overlying the biopsy site is noted.  The biopsy site is soft and dry.  No signs of infection noted.  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 Histology correlates with imaging findings. This finding was discussed with the patient and her questions answered.  She was given further post biopsy care instructions.  Assessment and Plan:  Recommend surgery/oncology consultation.  An appointment at the Multidisciplinary Clinic has been scheduled for 12/29/2011 and the patient  informed. Recommend bilateral breast MRI, which has been scheduled for 12/24/2011.   Original Report Authenticated By: Rosendo Gros, M.D.       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 There is strong suspicion for invasion. Patient would appear to be a good candidate for breast conservation therapy with  lumpectomy and radiation therapy.  Patient does wish to proceed with breast conservation therapy.  PLAN: She will he scheduled for partial mastectomy and sentinel node procedure under the direction of Dr. Derrell Lolling in the near future. Final management details will depend on results of her surgery.     ------------------------------------------------   Billie Lade, PhD, MD

## 2011-12-29 NOTE — Patient Instructions (Signed)
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  We have discussed different options for surgical intervention. You have expressed a desire for breast conservation, if possible.  You'll be scheduled for a left partial mastectomy with needle localization, and left axillary sentinel lymph node biopsy.  Dr. Jacinto Halim office will call you tomorrow to schedule the surgery.     Lumpectomy, Breast Conserving Surgery A lumpectomy is breast surgery that removes only part of the breast. Another name used may be partial mastectomy. The amount removed varies. Make sure you understand how much of your breast will be removed. Reasons for a lumpectomy:  Any solid breast mass.  Grouped significant nodularity that may be confused with a solitary breast mass. Lumpectomy is the most common form of breast cancer surgery today. The surgeon removes the portion of your breast which contains the tumor (cancer). This is the lump. Some normal tissue around the lump is also removed to be sure that all the tumor has been removed.  If cancer cells are found in the margins where the breast tissue was removed, your surgeon will do more surgery to remove the remaining cancer tissue. This is called re-excision surgery. Radiation and/or chemotherapy treatments are often given following a lumpectomy to kill any cancer cells that could possibly remain.  REASONS YOU MAY NOT BE ABLE TO HAVE BREAST CONSERVING SURGERY:  The tumor is located in more than one place.  Your breast is small and the tumor is large so the breast would be disfigured.  The entire tumor removal is not successful with a lumpectomy.  You cannot commit to a full course of chemotherapy, radiation therapy or are pregnant and cannot have radiation.  You have previously had radiation to the breast to treat cancer. HOW A LUMPECTOMY IS PERFORMED If overnight nursing is not required  following a biopsy, a lumpectomy can be performed as a same-day surgery. This can be done in a hospital, clinic, or surgical center. The anesthesia used will depend on your surgeon. They will discuss this with you. A general anesthetic keeps you sleeping through the procedure. LET YOUR CAREGIVERS KNOW ABOUT THE FOLLOWING:  Allergies  Medications taken including herbs, eye drops, over the counter medications, and creams.  Use of steroids (by mouth or creams)  Previous problems with anesthetics or Novocaine.  Possibility of pregnancy, if this applies  History of blood clots (thrombophlebitis)  History of bleeding or blood problems.  Previous surgery  Other health problems BEFORE THE PROCEDURE You should be present one hour prior to your procedure unless directed otherwise.  AFTER THE PROCEDURE  After surgery, you will be taken to the recovery area where a nurse will watch and check your progress. Once you're awake, stable, and taking fluids well, barring other problems you will be allowed to go home.  Ice packs applied to your operative site may help with discomfort and keep the swelling down.  A small rubber drain may be placed in the breast for a couple of days to prevent a hematoma from developing in the breast.  A pressure dressing may be applied for 24 to 48 hours to prevent bleeding.  Keep the wound dry.  You may resume a normal diet and activities as directed. Avoid strenuous activities affecting the arm on the side of the biopsy site such as tennis, swimming, heavy lifting (more than 10 pounds) or pulling.  Bruising in the breast is normal following this procedure.  Wearing a bra - even to bed - may be more comfortable and also help keep the dressing on.  Change dressings as directed.  Only take over-the-counter or prescription medicines for pain, discomfort, or fever as directed by your caregiver. Call for your results as instructed by your surgeon. Remember it is  your responsibility to get the results of your lumpectomy if your surgeon asked you to follow-up. Do not assume everything is fine if you have not heard from your caregiver. SEEK MEDICAL CARE IF:   There is increased bleeding (more than a small spot) from the wound.  You notice redness, swelling, or increasing pain in the wound.  Pus is coming from wound.  An unexplained oral temperature above 102 F (38.9 C) develops.  You notice a foul smell coming from the wound or dressing. SEEK IMMEDIATE MEDICAL CARE IF:   You develop a rash.  You have difficulty breathing.  You have any allergic problems. Document Released: 04/12/2006 Document Revised: 05/24/2011 Document Reviewed: 07/14/2006 [facility] Patient Information 2013 [place], [place].

## 2011-12-29 NOTE — Progress Notes (Signed)
Patient ID: MAYERLY NUNAMAKER, female   DOB: April 14, 1955, 56 y.o.   MRN: [id number]  No chief complaint on file.   HPI RYLIEE MEHALL is a 56 y.o. female.  She was referred by Dr. 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 Her primary care physician is Dr. Lynnea Ferrier.  The patient has not had any prior breast problems. Recent screening mammograms show a 2 cm diameter area of calcifications in the right breast, lower inner quadrant. Image guided biopsy shows ductal carcinoma in situ with suspicion for microinvasion. Receptors are negative. Clinical stage Tis, N0.  Subsequent MRI shows a slightly larger area, 3.7 cm of inhancement in the right breast, lower inner quadrant. Otherwise negative. A solitary finding.  The patient is interested in breast conservation if possible.  Family history is positive for breast cancer in a paternal aunt and a paternal grandmother died age 41 of breast cancer. Father had esophageal cancer.  The patient is here with her husband today. She is being seen in the [facility]  by me, Dr. Darnelle Catalan, and Dr. Antony Blackbird.  Comorbidities include mild obesity, depression, hiatal hernia with stricture requiring dilatation and GERD. She is married, works for [employer], has 4 children. HPI    Past Medical History  Diagnosis Date  . GERD (gastroesophageal reflux disease)   . Depression   . Rhinitis, allergic   . Esophageal stricture   . Hiatal hernia   . Chronic headaches   . Osteopenia     Past Surgical History  Procedure Date  . Upper gastrointestinal endoscopy     Family History  Problem Relation Age of Onset  . Breast cancer    . Liver disease    . Crohn's disease Mother   . Irritable bowel syndrome Mother   . Esophageal cancer Father   . Cancer Father     esophagus cancer  . Breast cancer Paternal Aunt   . Cancer Paternal Aunt      breast cancer  . Diabetes Maternal Grandmother   . Breast cancer Paternal Grandmother   . Diabetes Paternal Grandmother   . Cancer Paternal Grandmother     breast     Social History History  Substance Use Topics  . Smoking status: Former Smoker    Quit date: 03/15/2000  . Smokeless tobacco: Never Used   Comment: quit 2004  . Alcohol Use: No    Allergies  Allergen Reactions  . Penicillins   . Tramadol     Current Outpatient Prescriptions  Medication Sig Dispense Refill  . Calcium-Vitamin D (CALTRATE 600 PLUS-VIT D PO) Take by mouth.        . Cholecalciferol (VITAMIN D-3 PO) Take 5,000 Units by mouth.        . cyclobenzaprine (FLEXERIL) 10 MG tablet Take 10 mg by mouth as needed.      . DiphenhydrAMINE HCl (BENADRYL PO) Take by mouth as needed.      . pantoprazole (PROTONIX) 40 MG tablet Take 40 mg by mouth 2 (two) times daily.          Review of Systems Review of Systems  Constitutional: Negative for fever, chills and unexpected weight change.  HENT: Negative for hearing loss, congestion, sore throat, trouble swallowing and voice change.   Eyes: Negative for visual disturbance.  Respiratory: Negative for cough and wheezing.   Cardiovascular: Negative for chest pain, palpitations and leg swelling.  Gastrointestinal: Negative for nausea, vomiting, abdominal pain, diarrhea, constipation, blood in stool, abdominal distention and anal bleeding.  Genitourinary: Negative for hematuria, vaginal bleeding and difficulty urinating.  Musculoskeletal: Negative for arthralgias.  Skin: Negative for rash and wound.  Neurological: Negative for seizures, syncope and headaches.  Hematological: Negative for adenopathy. Does not bruise/bleed easily.  Psychiatric/Behavioral: Negative for confusion.    There were no vitals taken for this visit.  Physical Exam Physical Exam  Constitutional: She is oriented to person, place, and time. She appears well-developed and well-nourished. No  distress.  HENT:  Head: Normocephalic and atraumatic.  Nose: Nose normal.  Mouth/Throat: No oropharyngeal exudate.  Eyes: Conjunctivae normal and EOM are normal. Pupils are equal, round, and reactive to light. Left eye exhibits no discharge. No scleral icterus.  Neck: Neck supple. No JVD present. No tracheal deviation present. No thyromegaly present.  Cardiovascular: Normal rate, regular rhythm, normal heart sounds and intact distal pulses.   No murmur heard. Pulmonary/Chest: Effort normal and breath sounds normal. No respiratory distress. She has no wheezes. She has no rales. She exhibits no tenderness.       There is a bruise at the biopsy site in the right breast, lower inner quadrant. There is no palpable mass in either breast. No other skin changes. No axillary adenopathy.  Abdominal: Soft. Bowel sounds are normal. She exhibits no distension and no mass. There is no tenderness. There is no rebound and no guarding.  Musculoskeletal: She exhibits no edema and no tenderness.  Lymphadenopathy:    She has no cervical adenopathy.  Neurological: She is alert and oriented to person, place, and time. She exhibits normal muscle tone. Coordination normal.  Skin: Skin is warm. No rash noted. She is not diaphoretic. No erythema. No pallor.  Psychiatric: She has a normal mood and affect. Her behavior is normal. Judgment and thought content normal.    Data Reviewed I have reviewed her imaging studies, pathology report, pathology slides, breast diagnostic protocol. I have discussed her case in breast conference this morning. I have coordinated her care plan with Dr. Darnelle Catalan and Dr. Antony Blackbird.  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 Receptor negative. This is somewhere between 2 and 3.5 cm in diameter radiographically but is nonpalpable.  Depression  Hiatal hernia with esophageal stricture    Plan    We talked for  a long time about options for surgical intervention. We talked about lumpectomy and mastectomy, sentinel node biopsy and axillary lymph node dissection and radiation therapy, and about the uncertainty as to whether this was invasive disease or not.  She expressed a desire for breast conservation surgery if possible.  She will be scheduled for right partial mastectomy with needle localization and right axillary sentinel lymph node biopsy. This is superficial and I may need to take a skin ellipse to get a negative anterior margin.  We talked about the indications, details, techniques, and numerous risks of the surgery. She knows she may need a second operation if she has positive margins or positive nodes. We talked about cosmetic issues. She understands these issues. Her questions were answered. She agrees with this plan.  She will followup with Dr. Darnelle Catalan and Dr. Antony Blackbird after her surgery.       Angelia Mould. Derrell Lolling, M.D., [facility] General and Minimally invasive Surgery Breast and Colorectal Surgery Office:   [phone] Pager:   [phone]  12/29/2011, 4:48 PM

## 2011-12-30 ENCOUNTER — Other Ambulatory Visit (INDEPENDENT_AMBULATORY_CARE_PROVIDER_SITE_OTHER): Payer: Self-pay | Admitting: General Surgery

## 2011-12-30 ENCOUNTER — Encounter: Payer: Self-pay | Admitting: Oncology

## 2011-12-30 ENCOUNTER — Encounter: Payer: Self-pay | Admitting: Specialist

## 2011-12-30 DIAGNOSIS — C50319 Malignant neoplasm of lower-inner quadrant of unspecified female breast: Secondary | ICD-10-CM

## 2011-12-30 NOTE — Progress Notes (Signed)
ID: Amanda Hoover   DOB: 11-Aug-1955  MR#: [id number]  WUJ#:[id number]  PCP: [name], MD GYN: Melbourne Abts MD SU: Claud Kelp MD OTHER MD: Antony Blackbird, Claudette Head   HISTORY OF PRESENT ILLNESS: Amanda Hoover had routine bilateral screening mammography 12/02/2011 showing heterogeneously dense breasts. New calcifications were noted in the right breast, and were further evaluated with right diagnostic mammography 12/10/2011. This confirmed a group of pleomorphic microcalcifications in the lower inner quadrant of the right breast. 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 On 12/24/2011 the patient underwent bilateral breast MRI. This showed post biopsy changes in the right lower inner quadrant, associated with an area of clumped nodular enhancement measuring up to 3.7 cm.  The patient's subsequent history is as detailed below.  INTERVAL HISTORY: Amanda Hoover was seen together with her husband Amanda Hoover at the multidisciplinary breast cancer clinic 12/29/2011.  REVIEW OF SYSTEMS: She had no new symptoms leading to her mammogram, and even after the procedure was not able to palpate a mass in her right breast. She has some chronic sinus problems, and sleeps on several pillows at night. She feels 8T at times, and has not been able to exercise regularly because of low back pain issues. She wonders if she has psoriasis. She has occasional headaches, and she admits to anxiety and depression problems. Otherwise a detailed review of systems today was noncontributory.  PAST MEDICAL HISTORY: Past Medical History  Diagnosis Date  . GERD (gastroesophageal reflux disease)   . Depression   . Rhinitis, allergic   . Esophageal stricture   . Hiatal hernia   . Chronic headaches   . Osteopenia   . Osteoarthritis   . Palpitations      PAST SURGICAL HISTORY: Past Surgical History  Procedure Date  . Colonoscopy w/ polypectomy     FAMILY HISTORY Family History  Problem Relation Age of Onset  . Breast cancer    . Liver disease    . Crohn's disease Mother   . Irritable bowel syndrome Mother   . Esophageal cancer Father   . Cancer Father     esophagus cancer  . Breast cancer Paternal Aunt   . Cancer Paternal Aunt     breast cancer  . Diabetes Maternal Grandmother   . Breast cancer Paternal Grandmother   . Diabetes Paternal Grandmother   . Cancer Paternal Grandmother     breast    the patient's father died of cancer of the esophagus at age 58. He had been diagnosed with this shortly before. [name] is currently 54 years old (as of October 2013). [name] was diagnosed with breast cancer at the age of 46. [name] was also diagnosed with breast cancer, at the age of 62. [name] (her paternal grandmothers only sister) was diagnosed with breast cancer in her 37s. The patient had one brother who died from a viral illness at the age of 56. There is no history of ovarian cancer in the family.  GYNECOLOGIC HISTORY: Menarche age 56, last menstrual period at age 56. The patient took hormone replacement for approximately one year. She is GX P4, first live birth age 56  SOCIAL HISTORY: (updated October 2013) Amanda Hoover works as a Teacher, adult education. Her husband Amanda Hoover works in the [employer]. Son Amanda Hoover is a former and [employer] Amanda Hoover he is an Economist. Son Amanda Hoover lives in [place] where he works as a Land. Daughter Amanda Hoover [employer] as a Naval architect in [employer]. The patient has 3 grandchildren and one on the way. She attends a [employer].   ADVANCED DIRECTIVES: in place  HEALTH MAINTENANCE: History  Substance Use Topics  . Smoking status: Former Smoker    Quit  date: 03/15/2000  . Smokeless tobacco: Never Used   Comment: quit 2004  . Alcohol Use: No     Colonoscopy: 2013  PAP: 2012  Bone density: 08/17/2010/ T - 2.1  Lipid panel:  Allergies  Allergen Reactions  . Penicillins   . Tramadol     Current Outpatient Prescriptions  Medication Sig Dispense Refill  . Calcium-Vitamin D (CALTRATE 600 PLUS-VIT D PO) Take by mouth.        . Cholecalciferol (VITAMIN D-3 PO) Take 5,000 Units by mouth.        . pantoprazole (PROTONIX) 40 MG tablet Take 40 mg by mouth 2 (two) times daily.        . cyclobenzaprine (FLEXERIL) 10 MG tablet Take 10 mg by mouth as needed.      . DiphenhydrAMINE HCl (BENADRYL PO) Take by mouth as needed.        OBJECTIVE: Middle-aged white woman who appears well Filed Vitals:   12/29/11 1321  BP: 128/84  Pulse: 67  Temp: 98.3 F (36.8 C)  Resp: 20     Body mass index is 38.87 kg/(m^2).    ECOG FS: 0 Sclerae unicteric Oropharynx clear No cervical or supraclavicular adenopathy Lungs no rales or rhonchi Heart regular rate and rhythm Abd benign MSK no focal spinal tenderness, no peripheral edema Neuro: nonfocal Breasts: The right breast is status post recent biopsy. Aside from ecchymoses, there are no suspicious skin findings, no nipple retraction, and the right axilla is benign. The left breast is unremarkable.   LAB RESULTS: Lab Results  Component Value Date   WBC 5.3 12/29/2011   NEUTROABS 2.8 12/29/2011   HGB 14.2 12/29/2011   HCT 41.5 12/29/2011   MCV 94.5 12/29/2011   PLT 154 12/29/2011      Chemistry      Component Value Date/Time   NA 139 12/29/2011 1304   NA 139 02/21/2009 0605   NA 138 08/30/2008   K 3.5 12/29/2011 1304   K 3.7 02/21/2009 0605   CL 106 12/29/2011 1304   CL 109 02/21/2009 0605   CO2 23 12/29/2011 1304   CO2 24 02/21/2009 0605   BUN 11.0 12/29/2011 1304   BUN 8 02/21/2009 0605   BUN 12 08/30/2008   CREATININE 0.9 12/29/2011 1304   CREATININE 0.93 02/21/2009 0605    CREATININE 1.0 08/30/2008   GLU 99 08/30/2008      Component Value Date/Time   CALCIUM 9.9 12/29/2011 1304   CALCIUM 9.0 02/21/2009 0605   ALKPHOS 125 12/29/2011 1304   ALKPHOS 106 02/20/2009 1210   AST 41* 12/29/2011 1304   AST 39* 02/20/2009 1210   ALT 46 12/29/2011 1304   ALT 52* 02/20/2009 1210   BILITOT 0.70 12/29/2011 1304   BILITOT 0.8 02/20/2009 1210       No results found for this basename: LABCA2    No components found with this basename: LABCA125    No results found for this basename: INR:1;PROTIME:1 in the last 168 hours  Urinalysis    Component Value Date/Time  COLORURINE YELLOW 02/20/2009 1131   APPEARANCEUR CLEAR 02/20/2009 1131   LABSPEC 1.010 02/20/2009 1131   PHURINE 6.5 02/20/2009 1131   GLUCOSEU NEGATIVE 02/20/2009 1131   HGBUR NEGATIVE 02/20/2009 1131   BILIRUBINUR NEGATIVE 02/20/2009 1131   KETONESUR NEGATIVE 02/20/2009 1131   PROTEINUR NEGATIVE 02/20/2009 1131   UROBILINOGEN 0.2 02/20/2009 1131   NITRITE NEGATIVE 02/20/2009 1131   LEUKOCYTESUR NEGATIVE MICROSCOPIC NOT DONE ON URINES WITH NEGATIVE PROTEIN, BLOOD, LEUKOCYTES, NITRITE, OR GLUCOSE <1000 mg/dL. 02/20/2009 1131    STUDIES: Mr Breast Bilateral W Wo Contrast  12/24/2011  *RADIOLOGY REPORT*  Clinical Data: Newly-diagnosed right lower inner quadrant DCIS with foci of possible invasion manifesting as mammographically detected calcifications.  BILATERAL BREAST MRI WITH AND WITHOUT CONTRAST  Technique: Multiplanar, multisequence MR images of both breasts were obtained prior to and following the intravenous administration of 20ml of Multihance.  Three dimensional images were evaluated at the independent DynaCad workstation.  Comparison:  Prior mammograms  Findings: Right lower inner quadrant post biopsy changes are noted with clip artifact.  No other abnormal T2-weighted hyperintensity is identified in either breast.  No lymphadenopathy.  Background enhancement pattern is minimal.  In the right lower inner  quadrant, surrounding post biopsy change and clip artifact, is an area of segmental clumped nodular enhancement demonstrating predominately plateau type kinetics measuring overall 3.7 x 2.5 x 2.4 cm.  This corresponds to the area of biopsy-proven DCIS.  No other area of abnormal enhancement is seen in either breast.  IMPRESSION: Segmental clumped nodular enhancement measuring 3.7 cm in maximal anteroposterior dimension in the right breast lower inner quadrant at the site of biopsy-proven DCIS.  No other area of abnormal enhancement in either breast to suggest malignancy elsewhere.  RECOMMENDATION: Treatment plan  THREE-DIMENSIONAL MR IMAGE RENDERING ON INDEPENDENT WORKSTATION:  Three-dimensional MR images were rendered by post-processing of the original MR data on an independent workstation.  The three- dimensional MR images were interpreted, and findings were reported in the accompanying complete MRI report for this study.  BI-RADS CATEGORY 6:  Known biopsy-proven malignancy - appropriate action should be taken.   Original Report Authenticated By: Harrel Lemon, M.D.    Mm Breast Stereo Biopsy Right  12/17/2011  *RADIOLOGY REPORT*  Clinical Data:  Suspicious coarse heterogeneous right lower inner quadrant calcifications  STEREOTACTIC-GUIDED VACUUM ASSISTED BIOPSY OF THE RIGHT BREAST AND SPECIMEN RADIOGRAPH  Comparison: Previous exams.  I met with the patient and we discussed the procedure of stereotactic-guided biopsy, including benefits and alternatives. We discussed the high likelihood of a successful procedure. We discussed the risks of the procedure, including infection, bleeding, tissue injury, clip migration, and inadequate sampling. Informed, written consent was given.  Using sterile technique, 2% lidocaine, stereotactic guidance, and a 9 gauge vacuum assisted device, biopsy was performed of right lower inner quadrant calcifications using a inferior to superior approach.  Specimen radiograph was  performed, showing calcifications in the biopsy samples.  Specimens with calcifications are identified for pathology.  At the conclusion of the procedure, a T shaped tissue marker clip was deployed into the biopsy cavity.  Follow-up 2-view mammogram confirmed clip placement at the biopsy site.  IMPRESSION:  Stereotactic-guided biopsy of right lower inner quadrant calcifications, with clip placement.  Pathology is pending.  No apparent complications.   Original Report Authenticated By: Harrel Lemon, M.D.    Mm Breast Surgical Specimen  12/17/2011  *RADIOLOGY REPORT*  Clinical Data:  Suspicious coarse heterogeneous right lower inner quadrant calcifications  STEREOTACTIC-GUIDED VACUUM ASSISTED  BIOPSY OF THE RIGHT BREAST AND SPECIMEN RADIOGRAPH  Comparison: Previous exams.  I met with the patient and we discussed the procedure of stereotactic-guided biopsy, including benefits and alternatives. We discussed the high likelihood of a successful procedure. We discussed the risks of the procedure, including infection, bleeding, tissue injury, clip migration, and inadequate sampling. Informed, written consent was given.  Using sterile technique, 2% lidocaine, stereotactic guidance, and a 9 gauge vacuum assisted device, biopsy was performed of right lower inner quadrant calcifications using a inferior to superior approach.  Specimen radiograph was performed, showing calcifications in the biopsy samples.  Specimens with calcifications are identified for pathology.  At the conclusion of the procedure, a T shaped tissue marker clip was deployed into the biopsy cavity.  Follow-up 2-view mammogram confirmed clip placement at the biopsy site.  IMPRESSION:  Stereotactic-guided biopsy of right lower inner quadrant calcifications, with clip placement.  Pathology is pending.  No apparent complications.   Original Report Authenticated By: Harrel Lemon, M.D.    Mm Digital Diag Ltd R  12/10/2011  *RADIOLOGY REPORT*   Clinical Data:  Recall from screening 3-D mammography.  DIGITAL DIAGNOSTIC RIGHT BREAST MAMMOGRAM  Comparison:  12/02/2011, 05/21/2010, 03/03/2009, 01/12/2008, 10/14/2006.  Findings:  There is a group of pleomorphic microcalcifications located within the lower inner quadrant of the right breast which are suspicious for possible DCIS.  These measure maximally 2.0 cm in diameter.  Tissue sampling is recommended.  I have discussed stereotactic core biopsy of the calcifications with the patient. This will be scheduled per patient preference.  IMPRESSION: A group of suspicious calcifications located within the lower inner quadrant of the right breast as discussed above.  Tissue sampling is recommended and stereotactic core biopsy will be scheduled.  RECOMMENDATION: Right breast stereotactic core biopsy.  BI-RADS CATEGORY 4:  Suspicious abnormality - biopsy should be considered.   Original Report Authenticated By: Rolla Plate, M.D.    Mm Digital Screening  12/03/2011  *RADIOLOGY REPORT*  Clinical Data: Screening.  DIGITAL BILATERAL SCREENING MAMMOGRAM WITH CAD  DIGITAL BREAST TOMOSYNTHESIS  Digital breast tomosynthesis images are acquired in two projections.  These images are reviewed in combination with the digital mammogram, confirming the findings below.  Comparison:  Previous exams.  Findings: Two views of each breast demonstrate heterogeneously dense tissue.  In the right breast, calcifications warrant further evaluation with magnified views.  In the left breast, no mass or malignant type calcifications are identified.  Images were processed with CAD.  IMPRESSION: Further evaluation is suggested for calcifications in the right breast.  RECOMMENDATION: Diagnostic mammogram of the right breast. (Code:[id number]-31M)  BI-RADS CATEGORY 0:  Incomplete.  Need additional imaging evaluation and/or prior mammograms for comparison.   Original Report Authenticated By: Otilio Carpen, M.D.    Mm Radiologist Eval And  Mgmt  12/20/2011  *RADIOLOGY REPORT*  ESTABLISHED PATIENT OFFICE VISIT - LEVEL II [phone])  Chief Complaint:   The patient returns for right breast stereotactic biopsy results and biopsy wound check.  History:  56 year old female with suspicious calcification of the right breast.  Stereotactic biopsy performed on 12/17/2011.  Exam:  Ecchymosis overlying the biopsy site is noted.  The biopsy site is soft and dry.  No signs of infection noted.  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 Histology correlates with imaging findings. This finding was discussed with the patient and her questions answered.  She was given further post biopsy care instructions.  Assessment and Plan:  Recommend surgery/oncology consultation.  An appointment at the Multidisciplinary Clinic has been scheduled for 12/29/2011 and the patient informed. Recommend bilateral breast MRI, which has been scheduled for 12/24/2011.   Original Report Authenticated By: Rosendo Gros, M.D.     ASSESSMENT: 56 y.o. [place] woman status post right breast biopsy 12/17/2011 showing high-grade ductal carcinoma in situ, estrogen and progesterone receptor positive, with some areas suspicious for microinvasion.  PLAN: Berenize is a good candidate for breast conservation. She understands that in noninvasive breast cancer is not immediately life-threatening. Because her tumor is estrogen and progesterone receptor negative she is unlikely to derive any benefit from antiestrogen such other than prophylaxis against the development of another breast cancer in either breast. However she may have invasive disease in her somewhat large area of abnormality on the MRI, and for that reason she will have a sentinel lymph node at the time of her surgery. She understands she will need postoperative radiation therapy. She is going to see me again on 02/22/2012. By then we should have all the necessary information to make a definitive decision  regarding adjuvant systemic treatment, if any. She knows to call for any problems that may develop before the next visit.   Godwin Tedesco C    12/30/2011

## 2011-12-30 NOTE — Progress Notes (Signed)
I met with the patient and her husband at the clinic on Wednesday and reviewed the distress screening with her.  I also gave her information about support services and [facility].

## 2011-12-31 ENCOUNTER — Encounter (INDEPENDENT_AMBULATORY_CARE_PROVIDER_SITE_OTHER): Payer: Self-pay | Admitting: General Surgery

## 2011-12-31 NOTE — Progress Notes (Signed)
Faxed signed physician plan check list by Dr. Ingram to the attention of Marti C. Smith, PT at [facility] fax # [phone]. Confirmation received. Sent to medical records to be scanned.  

## 2012-01-05 ENCOUNTER — Telehealth (INDEPENDENT_AMBULATORY_CARE_PROVIDER_SITE_OTHER): Payer: Self-pay | Admitting: General Surgery

## 2012-01-05 NOTE — Telephone Encounter (Signed)
Patient calling with multiple questions about surgery next week. She has an ongoing issue where she breaks out around both of her elbows. She has had this area looked at by her PCP who did not know what this was. She was concerned that since she is having lymph nodes removed she may develop an infection if she has this ongoing issue. I made her aware since she is only having a couple lymph nodes removed this should not be an issue. I did encourage her to see her dermatologist about the breakouts on her elbows. She had multiple questions about surgery, pain after biopsy, and recovery that were answered. She will call back with any additional questions.

## 2012-01-06 ENCOUNTER — Encounter (HOSPITAL_BASED_OUTPATIENT_CLINIC_OR_DEPARTMENT_OTHER): Payer: Self-pay | Admitting: *Deleted

## 2012-01-06 ENCOUNTER — Ambulatory Visit
Admission: RE | Admit: 2012-01-06 | Discharge: 2012-01-06 | Disposition: A | Discharge status: Home / Self Care | Payer: BC Managed Care – PPO | Source: Ambulatory Visit | Attending: General Surgery | Admitting: General Surgery

## 2012-01-06 DIAGNOSIS — R21 Rash and other nonspecific skin eruption: Secondary | ICD-10-CM

## 2012-01-06 HISTORY — DX: Rash and other nonspecific skin eruption: R21

## 2012-01-06 NOTE — Pre-Procedure Instructions (Signed)
To go to [facility] for CXR

## 2012-01-10 ENCOUNTER — Telehealth: Payer: Self-pay | Admitting: *Deleted

## 2012-01-10 NOTE — Telephone Encounter (Signed)
Spoke to pt concerning [facility]C from 12/29/11.  Pt denies questions or concerns regarding dx or treatment care plan.  Encourage pt to call with needs.  Received verbal understanding.  Discussed SLN bx and possible need of ALND if invasive cells found.  Received verbal understanding.  Contact information given.

## 2012-01-11 NOTE — H&P (Signed)
Amanda Hoover    MRN: [id number]   Description: 56 year old female  Provider: Ernestene Mention, MD  Department: [facility]       History and Physical     Ernestene Mention, MD   Patient ID: Amanda Hoover, female   DOB: Jun 17, 1955, 56 y.o.   MRN: [id number]           HPI Amanda Hoover is a 56 y.o. female.  She was referred by Dr. 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 Her primary care physician is Dr. Lynnea Ferrier.   The patient has not had any prior breast problems. Recent screening mammograms show a 2 cm diameter area of calcifications in the right breast, lower inner quadrant. Image guided biopsy shows ductal carcinoma in situ with suspicion for microinvasion. Receptors are negative. Clinical stage Tis, N0.   Subsequent MRI shows a slightly larger area, 3.7 cm of inhancement in the right breast, lower inner quadrant. Otherwise negative. A solitary finding.   The patient is interested in breast conservation if possible.   Family history is positive for breast cancer in a paternal aunt and a paternal grandmother died age 58 of breast cancer. Father had esophageal cancer.   The patient is here with her husband today. She is being seen in the [facility]  by me, Dr. Darnelle Catalan, and Dr. Antony Blackbird.   Comorbidities include mild obesity, depression, hiatal hernia with stricture requiring dilatation and GERD. She is married, works for [employer], has 4 children.         Past Medical History   Diagnosis  Date   .  GERD (gastroesophageal reflux disease)     .  Depression     .  Rhinitis, allergic     .  Esophageal stricture     .  Hiatal hernia     .  Chronic headaches     .  Osteopenia         Past Surgical History   Procedure  Date   .  Upper gastrointestinal endoscopy         Family History   Problem  Relation  Age  of Onset   .  Breast cancer       .  Liver disease       .  Crohn's disease  Mother     .  Irritable bowel syndrome  Mother     .  Esophageal cancer  Father     .  Cancer  Father         esophagus cancer   .  Breast cancer  Paternal Aunt     .  Cancer  Paternal Aunt         breast cancer   .  Diabetes  Maternal Grandmother     .  Breast cancer  Paternal Grandmother     .  Diabetes  Paternal Grandmother     .  Cancer  Paternal Grandmother         breast       Social History History   Substance Use Topics   .  Smoking status:  Former Smoker       Quit date:  03/15/2000   .  Smokeless tobacco:  Never Used     Comment: quit 2004   .  Alcohol Use:  No  Allergies   Allergen  Reactions   .  Penicillins     .  Tramadol         Current Outpatient Prescriptions   Medication  Sig  Dispense  Refill   .  Calcium-Vitamin D (CALTRATE 600 PLUS-VIT D PO)  Take by mouth.           .  Cholecalciferol (VITAMIN D-3 PO)  Take 5,000 Units by mouth.           .  cyclobenzaprine (FLEXERIL) 10 MG tablet  Take 10 mg by mouth as needed.         .  DiphenhydrAMINE HCl (BENADRYL PO)  Take by mouth as needed.         .  pantoprazole (PROTONIX) 40 MG tablet  Take 40 mg by mouth 2 (two) times daily.              Review of Systems   Constitutional: Negative for fever, chills and unexpected weight change.  HENT: Negative for hearing loss, congestion, sore throat, trouble swallowing and voice change.   Eyes: Negative for visual disturbance.  Respiratory: Negative for cough and wheezing.   Cardiovascular: Negative for chest pain, palpitations and leg swelling.  Gastrointestinal: Negative for nausea, vomiting, abdominal pain, diarrhea, constipation, blood in stool, abdominal distention and anal bleeding.  Genitourinary: Negative for hematuria, vaginal bleeding and difficulty urinating.  Musculoskeletal: Negative for arthralgias.  Skin: Negative for rash and wound.  Neurological: Negative for  seizures, syncope and headaches.  Hematological: Negative for adenopathy. Does not bruise/bleed easily.  Psychiatric/Behavioral: Negative for confusion.    There were no vitals taken for this visit.   Physical Exam   Constitutional: She is oriented to person, place, and time. She appears well-developed and well-nourished. No distress.  HENT:   Head: Normocephalic and atraumatic.   Nose: Nose normal.   Mouth/Throat: No oropharyngeal exudate.  Eyes: Conjunctivae normal and EOM are normal. Pupils are equal, round, and reactive to light. Left eye exhibits no discharge. No scleral icterus.  Neck: Neck supple. No JVD present. No tracheal deviation present. No thyromegaly present.  Cardiovascular: Normal rate, regular rhythm, normal heart sounds and intact distal pulses.    No murmur heard. Pulmonary/Chest: Effort normal and breath sounds normal. No respiratory distress. She has no wheezes. She has no rales. She exhibits no tenderness.       There is a bruise at the biopsy site in the right breast, lower inner quadrant. There is no palpable mass in either breast. No other skin changes. No axillary adenopathy.  Abdominal: Soft. Bowel sounds are normal. She exhibits no distension and no mass. There is no tenderness. There is no rebound and no guarding.  Musculoskeletal: She exhibits no edema and no tenderness.  Lymphadenopathy:    She has no cervical adenopathy.  Neurological: She is alert and oriented to person, place, and time. She exhibits normal muscle tone. Coordination normal.  Skin: Skin is warm. No rash noted. She is not diaphoretic. No erythema. No pallor.  Psychiatric: She has a normal mood and affect. Her behavior is normal. Judgment and thought content normal.    Data Reviewed I have reviewed her imaging studies, pathology report, pathology slides, breast diagnostic protocol. I have discussed her case in breast conference this morning. I have coordinated her care plan with Dr.  Darnelle Catalan and Dr. Antony Blackbird.   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 Receptor negative. This is  somewhere between 2 and 3.5 cm in diameter radiographically but is nonpalpable.   Depression   Hiatal hernia with esophageal stricture   Plan We talked for a long time about options for surgical intervention. We talked about lumpectomy and mastectomy, sentinel node biopsy and axillary lymph node dissection and radiation therapy, and about the uncertainty as to whether this was invasive disease or not.   She expressed a desire for breast conservation surgery if possible.   She will be scheduled for right partial mastectomy with needle localization and right axillary sentinel lymph node biopsy. This is superficial and I may need to take a skin ellipse to get a negative anterior margin.   We talked about the indications, details, techniques, and numerous risks of the surgery. She knows she may need a second operation if she has positive margins or positive nodes. We talked about cosmetic issues. She understands these issues. Her questions were answered. She agrees with this plan.   She will followup with Dr. Darnelle Catalan and Dr. Antony Blackbird after her surgery.       Angelia Mould. Derrell Lolling, M.D., [facility] General and Minimally invasive Surgery Breast and Colorectal Surgery Office:   [phone] Pager:   [phone]

## 2012-01-12 ENCOUNTER — Encounter (HOSPITAL_BASED_OUTPATIENT_CLINIC_OR_DEPARTMENT_OTHER): Payer: Self-pay | Admitting: *Deleted

## 2012-01-12 ENCOUNTER — Encounter (HOSPITAL_COMMUNITY)
Admission: RE | Admit: 2012-01-12 | Discharge: 2012-01-12 | Disposition: A | Discharge status: Home / Self Care | Payer: BC Managed Care – PPO | Source: Ambulatory Visit | Attending: General Surgery | Admitting: General Surgery

## 2012-01-12 ENCOUNTER — Encounter (HOSPITAL_BASED_OUTPATIENT_CLINIC_OR_DEPARTMENT_OTHER): Payer: Self-pay | Admitting: Anesthesiology

## 2012-01-12 ENCOUNTER — Ambulatory Visit (HOSPITAL_BASED_OUTPATIENT_CLINIC_OR_DEPARTMENT_OTHER): Payer: BC Managed Care – PPO | Admitting: Anesthesiology

## 2012-01-12 ENCOUNTER — Ambulatory Visit (HOSPITAL_BASED_OUTPATIENT_CLINIC_OR_DEPARTMENT_OTHER)
Admission: RE | Admit: 2012-01-12 | Discharge: 2012-01-12 | Disposition: A | Discharge status: Home / Self Care | Payer: BC Managed Care – PPO | Source: Ambulatory Visit | Attending: General Surgery | Admitting: General Surgery

## 2012-01-12 ENCOUNTER — Ambulatory Visit
Admission: RE | Admit: 2012-01-12 | Discharge: 2012-01-12 | Disposition: A | Discharge status: Home / Self Care | Payer: BC Managed Care – PPO | Source: Ambulatory Visit | Attending: General Surgery | Admitting: General Surgery

## 2012-01-12 ENCOUNTER — Encounter (HOSPITAL_BASED_OUTPATIENT_CLINIC_OR_DEPARTMENT_OTHER)
Admission: RE | Disposition: A | Discharge status: Home / Self Care | Payer: Self-pay | Source: Ambulatory Visit | Attending: General Surgery

## 2012-01-12 DIAGNOSIS — C50319 Malignant neoplasm of lower-inner quadrant of unspecified female breast: Secondary | ICD-10-CM

## 2012-01-12 DIAGNOSIS — C50311 Malignant neoplasm of lower-inner quadrant of right female breast: Secondary | ICD-10-CM | POA: Diagnosis present

## 2012-01-12 DIAGNOSIS — D0590 Unspecified type of carcinoma in situ of unspecified breast: Secondary | ICD-10-CM

## 2012-01-12 DIAGNOSIS — C50919 Malignant neoplasm of unspecified site of unspecified female breast: Secondary | ICD-10-CM | POA: Insufficient documentation

## 2012-01-12 HISTORY — DX: Personal history of colon polyps, unspecified: Z86.0100

## 2012-01-12 HISTORY — DX: Dental restoration status: Z98.811

## 2012-01-12 HISTORY — DX: Personal history of other diseases of the digestive system: Z87.19

## 2012-01-12 HISTORY — DX: Other allergy status, other than to drugs and biological substances: Z91.09

## 2012-01-12 HISTORY — DX: Personal history of colonic polyps: Z86.010

## 2012-01-12 HISTORY — DX: Rash and other nonspecific skin eruption: R21

## 2012-01-12 HISTORY — DX: Personal history of other diseases of the nervous system and sense organs: Z86.69

## 2012-01-12 HISTORY — DX: Malignant neoplasm of unspecified site of unspecified female breast: C50.919

## 2012-01-12 HISTORY — PX: PARTIAL MASTECTOMY WITH NEEDLE LOCALIZATION AND AXILLARY SENTINEL LYMPH NODE BX: SHX6009

## 2012-01-12 HISTORY — DX: Headache: R51

## 2012-01-12 SURGERY — PARTIAL MASTECTOMY WITH NEEDLE LOCALIZATION AND AXILLARY SENTINEL LYMPH NODE BX
Anesthesia: General | Site: Breast | Laterality: Right | Wound class: Clean

## 2012-01-12 MED ORDER — VANCOMYCIN HCL IN DEXTROSE 1-5 GM/200ML-% IV SOLN
1000.0000 mg | Freq: Once | INTRAVENOUS | Status: AC
  Administered 2012-01-12 (×2): 1000 mg via INTRAVENOUS

## 2012-01-12 MED ORDER — SUCCINYLCHOLINE CHLORIDE 20 MG/ML IJ SOLN
INTRAMUSCULAR | Status: DC | PRN
  Administered 2012-01-12: 100 mg via INTRAVENOUS

## 2012-01-12 MED ORDER — SODIUM CHLORIDE 0.9 % IJ SOLN: 3.0000 mL | INTRAMUSCULAR | Status: DC | PRN

## 2012-01-12 MED ORDER — OXYCODONE HCL 5 MG PO TABS
5.0000 mg | ORAL_TABLET | Freq: Once | ORAL | Status: AC | PRN
  Administered 2012-01-12: 5 mg via ORAL

## 2012-01-12 MED ORDER — SODIUM CHLORIDE 0.9 % IJ SOLN: 3.0000 mL | Freq: Two times a day (BID) | INTRAMUSCULAR | Status: DC

## 2012-01-12 MED ORDER — LIDOCAINE HCL (CARDIAC) 20 MG/ML IV SOLN
INTRAVENOUS | Status: DC | PRN
  Administered 2012-01-12: 75 mg via INTRAVENOUS

## 2012-01-12 MED ORDER — BUPIVACAINE-EPINEPHRINE 0.5% -1:200000 IJ SOLN
INTRAMUSCULAR | Status: DC | PRN
  Administered 2012-01-12: 30 mL

## 2012-01-12 MED ORDER — HYDROCODONE-ACETAMINOPHEN 5-325 MG PO TABS: 1.0000 | ORAL_TABLET | ORAL | Status: DC | PRN

## 2012-01-12 MED ORDER — PROPOFOL 10 MG/ML IV BOLUS
INTRAVENOUS | Status: DC | PRN
  Administered 2012-01-12: 150 mg via INTRAVENOUS

## 2012-01-12 MED ORDER — SODIUM CHLORIDE 0.9 % IV SOLN: 250.0000 mL | INTRAVENOUS | Status: DC | PRN

## 2012-01-12 MED ORDER — ACETAMINOPHEN 650 MG RE SUPP: 650.0000 mg | RECTAL | Status: DC | PRN

## 2012-01-12 MED ORDER — TECHNETIUM TC 99M SULFUR COLLOID FILTERED
1.0000 | Freq: Once | INTRAVENOUS | Status: AC | PRN
  Administered 2012-01-12: 1 via INTRADERMAL

## 2012-01-12 MED ORDER — ACETAMINOPHEN 325 MG PO TABS: 650.0000 mg | ORAL_TABLET | ORAL | Status: DC | PRN

## 2012-01-12 MED ORDER — SCOPOLAMINE 1 MG/3DAYS TD PT72
1.0000 | MEDICATED_PATCH | TRANSDERMAL | Status: DC
  Administered 2012-01-12: 1.5 mg via TRANSDERMAL

## 2012-01-12 MED ORDER — ACETAMINOPHEN 10 MG/ML IV SOLN
1000.0000 mg | Freq: Once | INTRAVENOUS | Status: AC
  Administered 2012-01-12: 1000 mg via INTRAVENOUS

## 2012-01-12 MED ORDER — PROMETHAZINE HCL 25 MG/ML IJ SOLN
6.2500 mg | Freq: Once | INTRAMUSCULAR | Status: AC
  Administered 2012-01-12: 6.25 mg via INTRAVENOUS

## 2012-01-12 MED ORDER — MIDAZOLAM HCL 2 MG/2ML IJ SOLN
0.5000 mg | INTRAMUSCULAR | Status: DC | PRN
  Administered 2012-01-12: 2 mg via INTRAVENOUS

## 2012-01-12 MED ORDER — HYDROMORPHONE HCL PF 1 MG/ML IJ SOLN
0.2500 mg | INTRAMUSCULAR | Status: DC | PRN
  Administered 2012-01-12 (×4): 0.5 mg via INTRAVENOUS

## 2012-01-12 MED ORDER — MORPHINE SULFATE 2 MG/ML IJ SOLN: 2.0000 mg | INTRAMUSCULAR | Status: DC | PRN

## 2012-01-12 MED ORDER — OXYCODONE HCL 5 MG/5ML PO SOLN: 5.0000 mg | Freq: Once | ORAL | Status: AC | PRN

## 2012-01-12 MED ORDER — SODIUM CHLORIDE 0.9 % IJ SOLN
INTRAMUSCULAR | Status: DC | PRN
  Administered 2012-01-12: 12:00:00 via INTRAMUSCULAR

## 2012-01-12 MED ORDER — ONDANSETRON HCL 4 MG/2ML IJ SOLN: 4.0000 mg | Freq: Four times a day (QID) | INTRAMUSCULAR | Status: DC | PRN

## 2012-01-12 MED ORDER — FENTANYL CITRATE 0.05 MG/ML IJ SOLN
50.0000 ug | INTRAMUSCULAR | Status: DC | PRN
  Administered 2012-01-12: 100 ug via INTRAVENOUS

## 2012-01-12 MED ORDER — ONDANSETRON HCL 4 MG/2ML IJ SOLN
INTRAMUSCULAR | Status: DC | PRN
  Administered 2012-01-12: 4 mg via INTRAVENOUS

## 2012-01-12 MED ORDER — DEXAMETHASONE SODIUM PHOSPHATE 4 MG/ML IJ SOLN
INTRAMUSCULAR | Status: DC | PRN
  Administered 2012-01-12: 10 mg via INTRAVENOUS

## 2012-01-12 MED ORDER — CHLORHEXIDINE GLUCONATE 4 % EX LIQD: 1.0000 "application " | Freq: Once | CUTANEOUS | Status: DC

## 2012-01-12 MED ORDER — SODIUM CHLORIDE 0.9 % IV SOLN: INTRAVENOUS | Status: DC

## 2012-01-12 MED ORDER — HEPARIN SODIUM (PORCINE) 5000 UNIT/ML IJ SOLN
5000.0000 [IU] | Freq: Once | INTRAMUSCULAR | Status: AC
  Administered 2012-01-12: 5000 [IU] via SUBCUTANEOUS

## 2012-01-12 MED ORDER — MIDAZOLAM HCL 5 MG/5ML IJ SOLN
INTRAMUSCULAR | Status: DC | PRN
  Administered 2012-01-12: 2 mg via INTRAVENOUS

## 2012-01-12 MED ORDER — FENTANYL CITRATE 0.05 MG/ML IJ SOLN
INTRAMUSCULAR | Status: DC | PRN
  Administered 2012-01-12: 100 ug via INTRAVENOUS

## 2012-01-12 MED ORDER — CEFAZOLIN SODIUM-DEXTROSE 2-3 GM-% IV SOLR: 2.0000 g | INTRAVENOUS | Status: DC

## 2012-01-12 MED ORDER — LACTATED RINGERS IV SOLN
INTRAVENOUS | Status: DC
  Administered 2012-01-12 (×2): via INTRAVENOUS

## 2012-01-12 MED ORDER — OXYCODONE HCL 5 MG PO TABS: 5.0000 mg | ORAL_TABLET | ORAL | Status: DC | PRN

## 2012-01-12 SURGICAL SUPPLY — 62 items
APPLIER CLIP 11 MED OPEN (CLIP) ×2
BANDAGE ELASTIC 6 VELCRO ST LF (GAUZE/BANDAGES/DRESSINGS) IMPLANT
BENZOIN TINCTURE PRP APPL 2/3 (GAUZE/BANDAGES/DRESSINGS) IMPLANT
BLADE HEX COATED 2.75 (ELECTRODE) ×2 IMPLANT
BLADE SURG 10 STRL SS (BLADE) IMPLANT
BLADE SURG 15 STRL LF DISP TIS (BLADE) ×2 IMPLANT
BLADE SURG 15 STRL SS (BLADE) ×2
CANISTER SUCTION 1200CC (MISCELLANEOUS) ×2 IMPLANT
CHLORAPREP W/TINT 26ML (MISCELLANEOUS) ×2 IMPLANT
CLIP APPLIE 11 MED OPEN (CLIP) ×1 IMPLANT
CLOTH BEACON ORANGE TIMEOUT ST (SAFETY) ×2 IMPLANT
COVER MAYO STAND STRL (DRAPES) ×2 IMPLANT
COVER PROBE W GEL 5X96 (DRAPES) ×2 IMPLANT
COVER TABLE BACK 60X90 (DRAPES) ×2 IMPLANT
DECANTER SPIKE VIAL GLASS SM (MISCELLANEOUS) IMPLANT
DERMABOND ADVANCED (GAUZE/BANDAGES/DRESSINGS) ×1
DERMABOND ADVANCED .7 DNX12 (GAUZE/BANDAGES/DRESSINGS) ×1 IMPLANT
DEVICE DUBIN W/COMP PLATE 8390 (MISCELLANEOUS) ×2 IMPLANT
DRAIN CHANNEL 19F RND (DRAIN) IMPLANT
DRAIN HEMOVAC 1/8 X 5 (WOUND CARE) IMPLANT
DRAPE LAPAROSCOPIC ABDOMINAL (DRAPES) ×2 IMPLANT
DRAPE UTILITY XL STRL (DRAPES) ×2 IMPLANT
DRSG PAD ABDOMINAL 8X10 ST (GAUZE/BANDAGES/DRESSINGS) IMPLANT
ELECT REM PT RETURN 9FT ADLT (ELECTROSURGICAL) ×2
ELECTRODE REM PT RTRN 9FT ADLT (ELECTROSURGICAL) ×1 IMPLANT
EVACUATOR SILICONE 100CC (DRAIN) IMPLANT
GAUZE SPONGE 4X4 12PLY STRL LF (GAUZE/BANDAGES/DRESSINGS) IMPLANT
GAUZE SPONGE 4X4 16PLY XRAY LF (GAUZE/BANDAGES/DRESSINGS) IMPLANT
GLOVE BIO SURGEON STRL SZ 6.5 (GLOVE) ×2 IMPLANT
GLOVE BIOGEL PI IND STRL 6.5 (GLOVE) ×1 IMPLANT
GLOVE BIOGEL PI INDICATOR 6.5 (GLOVE) ×1
GLOVE EUDERMIC 7 POWDERFREE (GLOVE) ×2 IMPLANT
GOWN PREVENTION PLUS XLARGE (GOWN DISPOSABLE) ×2 IMPLANT
GOWN PREVENTION PLUS XXLARGE (GOWN DISPOSABLE) ×2 IMPLANT
KIT MARKER MARGIN INK (KITS) ×2 IMPLANT
NDL SAFETY ECLIPSE 18X1.5 (NEEDLE) ×1 IMPLANT
NEEDLE HYPO 18GX1.5 SHARP (NEEDLE) ×1
NEEDLE HYPO 25X1 1.5 SAFETY (NEEDLE) ×4 IMPLANT
NS IRRIG 1000ML POUR BTL (IV SOLUTION) ×2 IMPLANT
PACK BASIN DAY SURGERY FS (CUSTOM PROCEDURE TRAY) ×2 IMPLANT
PAD ALCOHOL SWAB (MISCELLANEOUS) ×2 IMPLANT
PENCIL BUTTON HOLSTER BLD 10FT (ELECTRODE) ×2 IMPLANT
PIN SAFETY STERILE (MISCELLANEOUS) IMPLANT
SLEEVE SCD COMPRESS KNEE MED (MISCELLANEOUS) IMPLANT
SPONGE GAUZE 4X4 12PLY (GAUZE/BANDAGES/DRESSINGS) ×2 IMPLANT
SPONGE LAP 18X18 X RAY DECT (DISPOSABLE) IMPLANT
SPONGE LAP 4X18 X RAY DECT (DISPOSABLE) ×2 IMPLANT
STRIP CLOSURE SKIN 1/2X4 (GAUZE/BANDAGES/DRESSINGS) IMPLANT
SUT ETHILON 3 0 FSL (SUTURE) IMPLANT
SUT MNCRL AB 4-0 PS2 18 (SUTURE) ×4 IMPLANT
SUT SILK 2 0 SH (SUTURE) ×2 IMPLANT
SUT VIC AB 2-0 CT1 27 (SUTURE)
SUT VIC AB 2-0 CT1 TAPERPNT 27 (SUTURE) IMPLANT
SUT VIC AB 3-0 SH 27 (SUTURE)
SUT VIC AB 3-0 SH 27X BRD (SUTURE) IMPLANT
SUT VICRYL 3-0 CR8 SH (SUTURE) ×4 IMPLANT
SYR CONTROL 10ML LL (SYRINGE) ×4 IMPLANT
TOWEL OR 17X24 6PK STRL BLUE (TOWEL DISPOSABLE) ×4 IMPLANT
TOWEL OR NON WOVEN STRL DISP B (DISPOSABLE) ×2 IMPLANT
TUBE CONNECTING 20X1/4 (TUBING) ×2 IMPLANT
WATER STERILE IRR 1000ML POUR (IV SOLUTION) ×2 IMPLANT
YANKAUER SUCT BULB TIP NO VENT (SUCTIONS) ×2 IMPLANT

## 2012-01-12 NOTE — Progress Notes (Signed)
Dr. Sampson Goon at bedside and notified of patients status.

## 2012-01-12 NOTE — Anesthesia Preprocedure Evaluation (Signed)
Anesthesia Evaluation  Patient identified by MRN, date of birth, ID band Patient awake    Reviewed: Allergy & Precautions, H&P , NPO status , Patient's Chart, lab work & pertinent test results  Airway Mallampati: II TM Distance: >3 FB Neck ROM: Full    Dental No notable dental hx. (+) Teeth Intact and Dental Advisory Given   Pulmonary neg pulmonary ROS,  breath sounds clear to auscultation  Pulmonary exam normal       Cardiovascular negative cardio ROS  Rhythm:Regular Rate:Normal     Neuro/Psych  Headaches, PSYCHIATRIC DISORDERS  Neuromuscular disease    GI/Hepatic Neg liver ROS, hiatal hernia, GERD-  Medicated and Poorly Controlled,  Endo/Other  Morbid obesity  Renal/GU negative Renal ROS     Musculoskeletal   Abdominal   Peds  Hematology negative hematology ROS (+)   Anesthesia Other Findings   Reproductive/Obstetrics negative OB ROS                           Anesthesia Physical Anesthesia Plan  ASA: III  Anesthesia Plan: General   Post-op Pain Management:    Induction: Intravenous  Airway Management Planned: Oral ETT  Additional Equipment:   Intra-op Plan:   Post-operative Plan: Extubation in OR  Informed Consent: I have reviewed the patients History and Physical, chart, labs and discussed the procedure including the risks, benefits and alternatives for the proposed anesthesia with the patient or authorized representative who has indicated his/her understanding and acceptance.   Dental advisory given  Plan Discussed with: CRNA  Anesthesia Plan Comments:         Anesthesia Quick Evaluation

## 2012-01-12 NOTE — Transfer of Care (Signed)
Immediate Anesthesia Transfer of Care Note  Patient: Amanda Hoover  Procedure(s) Performed: Procedure(s) (LRB) with comments: PARTIAL MASTECTOMY WITH NEEDLE LOCALIZATION AND AXILLARY SENTINEL LYMPH NODE BX (Right)  Patient Location: PACU  Anesthesia Type:General  Level of Consciousness: awake  Airway & Oxygen Therapy: Patient Spontanous Breathing and Patient connected to face mask oxygen  Post-op Assessment: Report given to PACU RN and Post -op Vital signs reviewed and stable  Post vital signs: Reviewed and stable  Complications: No apparent anesthesia complications

## 2012-01-12 NOTE — Interval H&P Note (Signed)
History and Physical Interval Note:  01/12/2012 10:46 AM  Amanda Hoover  has presented today for surgery, with the diagnosis of right breast cancer  The goals and the various methods of treatment have been discussed with the patient and family. After consideration of risks, benefits and other options for treatment, the patient has consented to  Procedure(s) (LRB) with comments: PARTIAL MASTECTOMY WITH NEEDLE LOCALIZATION AND AXILLARY SENTINEL LYMPH NODE BX (Right) as a surgical intervention .  The patient's history has been reviewed, patient examined today , no change in status, stable for surgery.  I have reviewed the patient's chart and labs.  Questions were answered to the patient's satisfaction.     Ernestene Mention

## 2012-01-12 NOTE — Op Note (Signed)
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 Derrell Lolling, M.D., FACS  Assistant:                      none   Operative Indications:   Amanda Hoover is a 56 y.o. female. She was referred by Dr. 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 Her primary care physician is Dr. Lynnea Ferrier.  The patient has not had any prior breast problems. Recent screening mammograms show a 2 cm diameter area of calcifications in the right breast, lower inner quadrant. Image guided biopsy shows ductal carcinoma in situ with suspicion for microinvasion. Receptors are negative. Clinical stage Tis, N0.  Subsequent MRI shows a slightly larger area, 3.7 cm of inhancement in the right breast, lower inner quadrant. Otherwise negative. A solitary finding.  The patient is interested in breast conservation if possible.  Family history is positive for breast cancer in a paternal aunt and a paternal grandmother died age 43 of breast cancer. Father had esophageal cancer.  She was  seen in the [facility] by me, Dr. Darnelle Catalan, and Dr. Antony Blackbird.  She is brought to the operating room electively.  Operative Findings:       The patient has a small cancer which was well localized by the marker clip and the wire. She has scattered benign appearing calcifications in a larger area. The specimen mammogram demonstrated the  marker clip and the wire within the specimen. I found 2 sentinel lymph nodes  Procedure in Detail:          The patient underwent  wire localization by Dr. Anselmo Pickler at the breast center [facility] and the wire placement was good. The wire entered medially and was directed posterior laterally. She was brought to the holding area [facility] where she underwent injection of radionuclide by the nuclear medicine technician. She was taken to the operating room. General endotracheal anesthesia was induced. Intravenous antibiotics were given. Surgical time out was performed. Following alcohol prep I injected 5 cc of blue dye in the right breast, subareolar area. 2 cc of methylene blue mixed with 3 cc of saline. The breast was massaged for 5 minutes.  The right breast and axilla were then prepped and draped in a sterile fashion. 0.5% Marcaine with epinephrine was used for local infiltration anesthetic. I made a transverse elliptical skin incision around the wire at about the 3:30 position encompassing the wire insertion site. Dissection was carried down widely into the breast tissue and deeply and also medially. The specimen was removed and marked with a 6 color ink kit to orient the pathologist. Specimen mammogram showed the wire and the  marker clip within the specimen and the margins looked reasonable.  Because she has  benign appearing calcifications nearby in the area is hard to know if we removed all the calcifications or not. Hemostasis was excellent and achieved electrocautery. After irrigating the wound I marked the lumpectomy cavity with metal clips and closed the breast tissue in layers with interrupted 3-0 Vicryl sutures and the skin with a running subcuticular suture of 4-0 Monocryl and Dermabond.  I then made a transverse incision in the right axilla at the hairline. Dissection was carried down through the subcutaneous tissue, incising the clavipectoral fascia. Using the neoprobe I isolated 2 sentinel lymph nodes. Both had high radioactivity and blue dye. Once I removed these 2 sentinel lymph nodes there was  minimal radioactivity in the axilla. There was no bleeding. The wound was irrigated and the deeper tissues closed with 3-0 Vicryl sutures and the skin closed with a running subcuticular suture of 4-0 Monocryl and Dermabond. The patient was taken to recovery stable condition. Counts correct. EBL 20 cc. Complications none.     Angelia Mould. Derrell Lolling, M.D., FACS General and Minimally Invasive Surgery Breast and Colorectal Surgery  01/12/2012 12:49 PM

## 2012-01-12 NOTE — Anesthesia Postprocedure Evaluation (Signed)
  Anesthesia Post-op Note  Patient: Amanda Hoover  Procedure(s) Performed: Procedure(s) (LRB) with comments: PARTIAL MASTECTOMY WITH NEEDLE LOCALIZATION AND AXILLARY SENTINEL LYMPH NODE BX (Right)  Patient Location: PACU  Anesthesia Type:General  Level of Consciousness: awake  Airway and Oxygen Therapy: Patient Spontanous Breathing and Patient connected to face mask oxygen  Post-op Pain: moderate  Post-op Assessment: Post-op Vital signs reviewed, Patient's Cardiovascular Status Stable, Respiratory Function Stable, Patent Airway and No signs of Nausea or vomiting  Post-op Vital Signs: Reviewed and stable  Complications: No apparent anesthesia complications

## 2012-01-12 NOTE — Anesthesia Procedure Notes (Signed)
Procedure Name: Intubation Date/Time: 01/12/2012 11:39 AM Performed by: Zenia Resides D Pre-anesthesia Checklist: Patient identified, Emergency Drugs available, Suction available, Patient being monitored and Timeout performed Patient Re-evaluated:Patient Re-evaluated prior to inductionOxygen Delivery Method: Circle System Utilized Preoxygenation: Pre-oxygenation with 100% oxygen Intubation Type: IV induction, Rapid sequence and Cricoid Pressure applied Ventilation: Mask ventilation without difficulty Laryngoscope Size: Mac and 3 Grade View: Grade II Tube type: Oral Tube size: 7.0 mm Number of attempts: 1 Airway Equipment and Method: stylet and oral airway Placement Confirmation: ETT inserted through vocal cords under direct vision,  positive ETCO2 and breath sounds checked- equal and bilateral Secured at: 22 cm Tube secured with: Tape Dental Injury: Teeth and Oropharynx as per pre-operative assessment

## 2012-01-13 ENCOUNTER — Encounter (HOSPITAL_BASED_OUTPATIENT_CLINIC_OR_DEPARTMENT_OTHER): Payer: Self-pay | Admitting: General Surgery

## 2012-01-14 ENCOUNTER — Telehealth (INDEPENDENT_AMBULATORY_CARE_PROVIDER_SITE_OTHER): Payer: Self-pay | Admitting: General Surgery

## 2012-01-14 NOTE — Telephone Encounter (Signed)
Pt called to report allergic reaction to Tramadol---began with flushing, then began itching and lips became swollen.  She took Benadryl and has not taken any more.  Advised pt to continue taking Benadryl 25-50 mg Q6H until all symptoms are resolved.  Paged and updated Dr. Derrell Lolling.  Ordered Percocet 5/325 mg, # 30 , 1-2 po Q 4-6 H prn pain , no refill written and signed by Dr. Donell Beers (who is in the office today.)  Pt understands to take the Benadryl and will sent daughter to pick up the Rx at the front desk.  Chart marked for the new allergy.

## 2012-01-14 NOTE — Telephone Encounter (Signed)
Error in  Previous note. The pt called regarding allergic reaction to VICODIN----not Tramadol as noted.

## 2012-01-17 ENCOUNTER — Telehealth: Payer: Self-pay | Admitting: Oncology

## 2012-01-17 ENCOUNTER — Telehealth (INDEPENDENT_AMBULATORY_CARE_PROVIDER_SITE_OTHER): Payer: Self-pay | Admitting: General Surgery

## 2012-01-17 ENCOUNTER — Encounter: Payer: Self-pay | Admitting: *Deleted

## 2012-01-17 NOTE — Telephone Encounter (Signed)
The patient called after speaking with Dr. Derrell Lolling.   He has scheduled a follow up with her on  Thursday to discuss her focally close margin post op lumpectomy.   She is feeling very anxious about this.  I reviewed her pathology report and sent her an email to refer her to [email or web address] that has great explanations and pictures to help explain her diagnosis, after I verbally explained what this means.   Dr. Derrell Lolling will communicate with Dr. Roselind Messier and they will decide if she needs a re-excision.   I explained to the patient that several years ago, doctors were more conservative and required a large margin however the surgeries were more disfiguring.   Today, the risks and benefits are considered and options explained to the patient.    She will review the website and expressed feeling much better about the situation.  She has my number should she have further questions.

## 2012-01-17 NOTE — Progress Notes (Signed)
Mailed after appt letter to pt. 

## 2012-01-17 NOTE — Telephone Encounter (Signed)
Pathology report of her right breast partial mastectomy shows microinvasive ductal carcinoma and ductal carcinoma in situ, high-grade. The DCIS is focally less than 0.1 cm from the posterior margin. 2 sentinel lymph nodes were evaluated and they are both negative.  I called the patient to discuss things with her. She's having some swelling and may have a little bit of fluid in the lumpectomy site but is otherwise doing okay. I reviewed the pathology report with her. I told her we might need to re excise that posterior margin.  She is going to see me in the office this Thursday and I'll let Dr. Roselind Messier know and  ask him to review the pathology report as well to get his opinion about re-excision. She has an appointment to see Dr. Roselind Messier  on November 11.   Angelia Mould. Derrell Lolling, M.D., [facility] General and Minimally invasive Surgery Breast and Colorectal Surgery Office:   [phone] Pager:   [phone]

## 2012-01-17 NOTE — Telephone Encounter (Signed)
Pt called to ask for postop appt and for path results.

## 2012-01-19 ENCOUNTER — Telehealth: Payer: Self-pay | Admitting: Oncology

## 2012-01-19 NOTE — Telephone Encounter (Signed)
I called to follow up with the patient to see if she had any more questions about her margin.   She is going to review the website online today and will call me with questions.  She is looking forward to her appt with Dr. Derrell Lolling in the am.

## 2012-01-20 ENCOUNTER — Ambulatory Visit (INDEPENDENT_AMBULATORY_CARE_PROVIDER_SITE_OTHER): Payer: BC Managed Care – PPO | Admitting: General Surgery

## 2012-01-20 ENCOUNTER — Encounter (INDEPENDENT_AMBULATORY_CARE_PROVIDER_SITE_OTHER): Payer: Self-pay | Admitting: General Surgery

## 2012-01-20 VITALS — BP 128/70 | HR 72 | Temp 97.6°F | Resp 16 | Ht 64.5 in | Wt 227.4 lb

## 2012-01-20 DIAGNOSIS — C50319 Malignant neoplasm of lower-inner quadrant of unspecified female breast: Secondary | ICD-10-CM

## 2012-01-20 NOTE — Patient Instructions (Signed)
Your pathology report from your recent right lumpectomy and sentinel node biopsy shows microinvasive cancer and noninvasive cancer, high-grade, receptor negative, nodes are negative.   The noninvasive cancer is focally at less than 1 mm close to the posterior margin, but the margin is not positive.  You expressed a strong desire to go ahead and have this posterior margin reexcised and so we will schedule that surgery.  Keep yourappt. With Dr. Roselind Messier on November 11. Call me if anything changes.  We will plan to get a baseline mammogram after your reexcision and before you radiation therapy.

## 2012-01-20 NOTE — Progress Notes (Signed)
Patient ID: Amanda Hoover, female   DOB: 19-Jul-1955, 56 y.o.   MRN: [id number] History: This patient returns for discussion of management of her right breast cancer. She recently underwent a right partial mastectomy with needle localization and right axillary sentinel node biopsy. Final pathology report shows microinvasive ductal carcinoma and ductal carcinoma in situ, high-grade, receptor negative. The in situ cancer is focally less than 1 mm to the posterior margin. The nodes are negative. Pathologic stage TImic., N0. I have discussed this with the patient and have sent a message to Dr. Roselind Messier as to whether we need to re excise this area or simply radiate it.  She has an appointment with Dr. Roselind Messier on November 11. The patient states that she is strongly motivated to have this area reexcised because of the breast cancer in her grandmother and aunt. She's had some swelling of the breast but is otherwise doing okay.   Exam: Patient is in no distress. A friend is with her throughout the encounter. Right breast reveals incision in the right axilla and in the medial right breast to be healing without complication. There is some resolving ecchymoses but no fluid collections, no hematoma, and no sign of infection. Symmetry and nipple projection all are good. Lungs: Clear to auscultation bilaterally Heart: Regular rate and rhythm. No ectopy. No murmur.   Assessment: Microinvasive ductal carcinoma and noninvasive cancer right breast, medial aspect, high-grade, receptor negative, node-negative. Stage TI mic., N0. Focally close margin posteriorly, no ink on  tumor Positive family history for breast cancer in second line relatives. Patient strongly desires reexcision of posterior margin   Plan: We talked for a long time about her pathology, and options for management. She is aware that Dr. Roselind Messier might be willing to simply radiate this area without further reexcision, but that is not acceptable to her, she  wants this area reexcised. She does not seem interested in a mastectomy, however.  She will be tentatively scheduled for right partial mastectomy with reexcision of posterior margin in the near future.  We will plan baseline mammogram after the reexcision but before radiation therapy Because of scattered benign calcifications noted previously.  She will see Dr. Roselind Messier November 11. She will call me if her treatment plan changes.    Angelia Mould. Derrell Lolling, M.D., [facility] General and Minimally invasive Surgery Breast and Colorectal Surgery Office:   [phone] Pager:   [phone]

## 2012-01-21 ENCOUNTER — Encounter: Payer: Self-pay | Admitting: *Deleted

## 2012-01-21 DIAGNOSIS — G8929 Other chronic pain: Secondary | ICD-10-CM | POA: Insufficient documentation

## 2012-01-24 ENCOUNTER — Ambulatory Visit
Admission: RE | Admit: 2012-01-24 | Discharge: 2012-01-24 | Disposition: A | Discharge status: Home / Self Care | Payer: BC Managed Care – PPO | Source: Ambulatory Visit | Attending: Radiation Oncology | Admitting: Radiation Oncology

## 2012-01-24 ENCOUNTER — Encounter: Payer: Self-pay | Admitting: Radiation Oncology

## 2012-01-24 VITALS — BP 114/59 | HR 63 | Temp 97.9°F | Wt 231.8 lb

## 2012-01-24 DIAGNOSIS — Z9010 Acquired absence of unspecified breast and nipple: Secondary | ICD-10-CM | POA: Insufficient documentation

## 2012-01-24 DIAGNOSIS — Z91012 Allergy to eggs: Secondary | ICD-10-CM | POA: Insufficient documentation

## 2012-01-24 DIAGNOSIS — Z880 Allergy status to penicillin: Secondary | ICD-10-CM | POA: Insufficient documentation

## 2012-01-24 DIAGNOSIS — Z91010 Allergy to peanuts: Secondary | ICD-10-CM | POA: Insufficient documentation

## 2012-01-24 DIAGNOSIS — C50319 Malignant neoplasm of lower-inner quadrant of unspecified female breast: Secondary | ICD-10-CM | POA: Insufficient documentation

## 2012-01-24 HISTORY — DX: Malignant neoplasm of unspecified site of right female breast: C50.911

## 2012-01-24 NOTE — Progress Notes (Signed)
Please see the Nurse Progress Note in the MD Initial Consult Encounter for this patient. 

## 2012-01-24 NOTE — Progress Notes (Signed)
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   she was initially seen in the multidisciplinary breast clinic along with Dr. Derrell Lolling and Dr. Darnelle Catalan.   Since that time the patient has undergone her definitive surgery she underwent a partial mastectomy and sentinel node procedure on 01/12/2012. The patient was found to have microinvasive ductal carcinoma within the right breast specimen. In addition ductal carcinoma in situ with calcifications, high-grade was noted within the lumpectomy specimen. The ductal carcinoma in situ was focally less than 0.1 cm from the posterior margin. I did review the pathology with the doctor Colonel Bald and the 1 mm of focal margin: He was felt to be accurate with no tumor cut thru.  2 sentinel lymph nodes were recovered both of which were benign.  She is been recovering well from her surgery                         ALLERGIES:  is allergic to macadamia nut oil; other; peanut-containing drug products; tomato; adhesive; flour; penicillins; vicodin; eggs or egg-derived products; soap; and tramadol.  Meds: Current Outpatient Prescriptions  Medication Sig Dispense Refill  . Calcium-Vitamin D (CALTRATE 600 PLUS-VIT D PO) Take by mouth.        . Cholecalciferol (VITAMIN D-3 PO) Take 5,000 Units by mouth.        . cyclobenzaprine (FLEXERIL) 10 MG tablet Take 10 mg by mouth as needed.      . Multiple Vitamin (MULTIVITAMIN) tablet Take 1 tablet by mouth daily.      . pantoprazole (PROTONIX) 40 MG tablet Take 40 mg by mouth 2 (two) times daily.       . DiphenhydrAMINE HCl (BENADRYL PO) Take by mouth as needed.        Physical Findings: The patient is in no acute distress.  Patient is alert and oriented.  weight is 231 lb 12.8 oz (105.144 kg). Her temperature is 97.9 F (36.6 C). Her blood pressure is 114/59 and her pulse is 63. Marland Kitchen  No palpable cervical supraclavicular or axillary adenopathy. The lungs are clear to auscultation. The heart has a regular rhythm and rate. Examination of the left breast reveals no mass or nipple discharge. Examination of the right breast reveals a scar in the lower inner quadrant which is healing well.  This is located in the proximal the 3:30 position of the right breast.  She also has a separate scar in the axillary region from her sentinel procedure.  This is also healing well without signs of drainage or infection.  Lab Findings: Lab Results  Component Value Date   WBC 5.3 12/29/2011   HGB 13.3 01/12/2012   HCT 41.5 12/29/2011   MCV 94.5 12/29/2011   PLT 154 12/29/2011    @LASTCHEM @  Radiographic Findings: Chest 2 View  01/06/2012  *RADIOLOGY REPORT*  Clinical Data: Preop for right breast surgery  CHEST - 2 VIEW  Comparison: Chest x-ray of 02/20/2009 and CT angio chest of 02/21/2009  Findings: No active infiltrate or effusion is seen.  Mediastinal contours are normal.  The heart is within normal limits in size. No bony abnormality is seen.  IMPRESSION: No active  lung disease.   Original Report Authenticated By: Juline Patch, M.D.    Nm Sentinel Node Inj-no Rpt (breast)  01/12/2012  CLINICAL DATA: Cancer right breast   Sulfur colloid was injected intradermally by the nuclear medicine  technologist for breast cancer sentinel node localization.     Mm Breast Surgical Specimen  01/12/2012  *RADIOLOGY REPORT*  Clinical Data:  Preoperative needle localization right breast carcinoma.  NEEDLE LOCALIZATION WITH MAMMOGRAPHIC GUIDANCE AND SPECIMEN RADIOGRAPH  Comparison:  Previous exams.  Patient presents for needle localization prior to surgical excision of the right breast carcinoma.  I met with the patient and we discussed the  procedure of needle localization including benefits and alternatives. We discussed the high likelihood of a successful procedure. We discussed the risks of the procedure, including infection, bleeding, tissue injury, and further surgery. Informed, written consent was given.  Using mammographic guidance, sterile technique, 2% lidocaine and a 7 cm modified Kopans needle, the clip with residual calcifications was localized using a medial approach.  The films are marked for Dr. Derrell Lolling.  Specimen radiograph was performed at surgery, and confirms the clip, intact wire, and residual calcifications to be present in the tissue sample.  The specimen is marked for pathology.  IMPRESSION: Needle localization of the right breast.  No apparent complications.   Original Report Authenticated By: Rolla Plate, M.D.    Mm Breast Wire Localization Right  01/12/2012  *RADIOLOGY REPORT*  Clinical Data:  Preoperative needle localization right breast carcinoma.  NEEDLE LOCALIZATION WITH MAMMOGRAPHIC GUIDANCE AND SPECIMEN RADIOGRAPH  Comparison:  Previous exams.  Patient presents for needle localization prior to surgical excision of the right breast carcinoma.  I met with the patient and we discussed the procedure of needle localization including benefits and alternatives. We discussed the high likelihood of a successful procedure. We discussed the risks of the procedure, including infection, bleeding, tissue injury, and further surgery. Informed, written consent was given.  Using mammographic guidance, sterile technique, 2% lidocaine and a 7 cm modified Kopans needle, the clip with residual calcifications was localized using a medial approach.  The films are marked for Dr. Derrell Lolling.  Specimen radiograph was performed at surgery, and confirms the clip, intact wire, and residual calcifications to be present in the tissue sample.  The specimen is marked for pathology.  IMPRESSION: Needle localization of the right breast.  No apparent  complications.   Original Report Authenticated By: Rolla Plate, M.D.     Impression:  Microinvasive ductal carcinoma the right breast. Patient will be excellent candidate for breast conserving therapy with radiation therapy directed at the right breast area.  I discussed 2 Gen. options for management of her close posterior margin. We discussed reexcision. We also discussed  giving an additional dose of radiation therapy to this region as a boost.  Given the patient's strong family history she would rather proceed with reexcision which is already scheduled through Dr. Jacinto Halim office.  Patient will proceed with this surgery in early December.  She will also undergo a post surgical mammogram to service as a  baseline for future imaging studies.  She will begin her radiation therapy approximate 5-6 weeks postop.    _____________________________________    Billie Lade, PhD, MD

## 2012-01-24 NOTE — Progress Notes (Signed)
Patient, son and friend here for follow up new consult of right breast cancer post right breast lumpectomy high grade dcis receptor negative.Awaiting decision on whether to have reexcision after pathology reviewed by Dr.Kinard.

## 2012-01-26 ENCOUNTER — Telehealth (INDEPENDENT_AMBULATORY_CARE_PROVIDER_SITE_OTHER): Payer: Self-pay | Admitting: General Surgery

## 2012-01-26 ENCOUNTER — Inpatient Hospital Stay: Admission: RE | Admit: 2012-01-26 | Payer: BC Managed Care – PPO | Source: Ambulatory Visit

## 2012-01-26 ENCOUNTER — Other Ambulatory Visit (INDEPENDENT_AMBULATORY_CARE_PROVIDER_SITE_OTHER): Payer: Self-pay | Admitting: General Surgery

## 2012-01-26 DIAGNOSIS — C50319 Malignant neoplasm of lower-inner quadrant of unspecified female breast: Secondary | ICD-10-CM

## 2012-01-26 NOTE — Telephone Encounter (Signed)
Called and left message for patient to return call. Patient set up for digital screening per Dr. Jacinto Halim request. The breast center has a spot open today 01/26/12 for patient to arrive at 4:30 for a 4:45 appointment. Asked patient to please call back as soon as message has been received.

## 2012-01-27 ENCOUNTER — Telehealth (INDEPENDENT_AMBULATORY_CARE_PROVIDER_SITE_OTHER): Payer: Self-pay | Admitting: General Surgery

## 2012-01-27 NOTE — Telephone Encounter (Signed)
Patient returned call based on message left yesterday regarding mammogram needed prior to surgery on 02/15/12. Patient stated she has been taking care of her mother who has been ill and did not get the message about the test until today. Patient given the number to the breast center to call directly to schedule the appointment prior to surgery based on her schedule since she is the only one taking care of her mother at this time.   Patient stated her father passed away earlier this year from cancer, and she does not have any other family to help her at this time. I suggested she contact social services or department of health to see if help can be provided based on her mother's income and help take some the stress off of her. Patient agreed to check into this option.  Patient advised this test was needed in order for Dr. Derrell Lolling to have the information he needed for the surgery. Patient understood.

## 2012-01-31 ENCOUNTER — Telehealth (INDEPENDENT_AMBULATORY_CARE_PROVIDER_SITE_OTHER): Payer: Self-pay

## 2012-01-31 NOTE — Addendum Note (Signed)
Encounter addended by: Gerlene Burdock on: 01/31/2012  2:16 PM<BR>     Documentation filed: Charges VN

## 2012-01-31 NOTE — Telephone Encounter (Signed)
The pt called reporting she has a sore throat and post nasal drainage.  Her surgery is 12/3.  She wanted to know if she can take a decongestant and an antihistamine.  I told her she can but make sure to avoid Aspirin and any Antiinflammatories in what she takes.  I told her if it gets worse or if she develops fever or cough she needs to get checked by her medical md.  She will call them if it does and get a throat culture.  She had a bad head ache yesterday and her dr had prescribed a muscle relaxer to use as needed.  She asked if that's ok.  I told her it should be fine but not to use one close to surgery.  She will call back if it worsens.

## 2012-02-01 ENCOUNTER — Encounter: Payer: Self-pay | Admitting: Dietician

## 2012-02-01 NOTE — Progress Notes (Signed)
Breast Cancer Nutrition Class Attendance Note  Date: 02/01/2012  Pt attended  [facility]'s Breast Cancer Nutrition Class, "Food For Your Fight". Pt was educated on basic cancer nutrition principles, including plant based diet and principles from  ([employer]) about latest nutrition findings and recommendations. Questions answered. Handouts and recipes provided.   Lillyonna Armstead A. Kayan, RD, LDN 

## 2012-02-03 ENCOUNTER — Ambulatory Visit
Admission: RE | Admit: 2012-02-03 | Discharge: 2012-02-03 | Disposition: A | Discharge status: Home / Self Care | Payer: BC Managed Care – PPO | Source: Ambulatory Visit | Attending: General Surgery | Admitting: General Surgery

## 2012-02-03 ENCOUNTER — Other Ambulatory Visit (INDEPENDENT_AMBULATORY_CARE_PROVIDER_SITE_OTHER): Payer: Self-pay | Admitting: General Surgery

## 2012-02-03 DIAGNOSIS — C50319 Malignant neoplasm of lower-inner quadrant of unspecified female breast: Secondary | ICD-10-CM

## 2012-02-08 NOTE — Progress Notes (Signed)
Pt had lumpectomy 01/13/12-need re-exc

## 2012-02-11 NOTE — H&P (Signed)
Amanda Hoover  Description:  56 year old female  01/20/2012 8:00 AM Office Visit Provider:  Ernestene Mention, MD  MRN: [id number] Department:  [facility]            Diagnoses  Reason for Visit    Cancer of lower-inner quadrant of female breast - Primary  Routine Post Op   174.3  Partial mastectomy 01/12/12           Vitals - Last Recorded       BP  Pulse  Temp  Resp  Ht  Wt    128/70  72  97.6 F (36.4 C) (Temporal)  16  5' 4.5" (1.638 m)  227 lb 6 oz (103.137 kg)          BMI               38.43 kg/m2                          History and Physical     Ernestene Mention, MD   Patient ID: Amanda Hoover, female DOB: 03-06-1956, 56 y.o. MRN: [id number]  History: This patient returns for discussion of management of her right breast cancer.  She recently underwent a right partial mastectomy with needle localization and right axillary sentinel node biopsy. Final pathology report shows microinvasive ductal carcinoma and ductal carcinoma in situ, high-grade, receptor negative. The in situ cancer is focally less than 1 mm to the posterior margin. The nodes are negative. Pathologic stage TImic., N0.  I have discussed this with the patient and have sent a message to Dr. Roselind Messier as to whether we need to re excise this area or simply radiate it. She has an appointment with Dr. Roselind Messier on November 11. The patient states that she is strongly motivated to have this area reexcised because of the breast cancer in her grandmother and aunt.  She's had some swelling of the breast but is otherwise doing okay.  Exam: Patient is in no distress. A friend is with her throughout the encounter.  Right breast reveals incision in the right axilla and in the medial right breast to be healing without complication. There is some resolving ecchymoses but no fluid collections, no hematoma, and no sign of infection. Symmetry and nipple projection all are good.  Lungs: Clear to  auscultation bilaterally  Heart: Regular rate and rhythm. No ectopy. No murmur.  Assessment: Microinvasive ductal carcinoma and noninvasive cancer right breast, medial aspect, high-grade, receptor negative, node-negative. Stage TI mic., N0.  Focally close margin posteriorly, no ink on tumor  Positive family history for breast cancer in second line relatives.  Patient strongly desires reexcision of posterior margin  Plan: We talked for a long time about her pathology, and options for management. She is aware that Dr. Roselind Messier might be willing to simply radiate this area without further reexcision, but that is not acceptable to her, she wants this area reexcised. She does not seem interested in a mastectomy, however.  She will be tentatively scheduled for right partial mastectomy with reexcision of posterior margin in the near future.  We will plan baseline mammogram after the reexcision but before radiation therapy Because of scattered benign calcifications noted previously.  She will see Dr. Roselind Messier November 11. She will call me if her treatment plan changes    Yumi Insalaco M. Derrell Lolling, M.D., [facility]  General  and Minimally invasive Surgery  Breast and Colorectal Surgery  Office: [phone]  Pager: [phone]

## 2012-02-15 ENCOUNTER — Ambulatory Visit (HOSPITAL_BASED_OUTPATIENT_CLINIC_OR_DEPARTMENT_OTHER): Payer: BC Managed Care – PPO | Admitting: Anesthesiology

## 2012-02-15 ENCOUNTER — Encounter (HOSPITAL_BASED_OUTPATIENT_CLINIC_OR_DEPARTMENT_OTHER): Payer: Self-pay | Admitting: Anesthesiology

## 2012-02-15 ENCOUNTER — Ambulatory Visit (HOSPITAL_BASED_OUTPATIENT_CLINIC_OR_DEPARTMENT_OTHER)
Admission: RE | Admit: 2012-02-15 | Discharge: 2012-02-15 | Disposition: A | Discharge status: Home / Self Care | Payer: BC Managed Care – PPO | Source: Ambulatory Visit | Attending: General Surgery | Admitting: General Surgery

## 2012-02-15 ENCOUNTER — Encounter (HOSPITAL_BASED_OUTPATIENT_CLINIC_OR_DEPARTMENT_OTHER)
Admission: RE | Disposition: A | Discharge status: Home / Self Care | Payer: Self-pay | Source: Ambulatory Visit | Attending: General Surgery

## 2012-02-15 ENCOUNTER — Encounter (HOSPITAL_BASED_OUTPATIENT_CLINIC_OR_DEPARTMENT_OTHER): Payer: Self-pay | Admitting: *Deleted

## 2012-02-15 DIAGNOSIS — C50311 Malignant neoplasm of lower-inner quadrant of right female breast: Secondary | ICD-10-CM | POA: Diagnosis present

## 2012-02-15 DIAGNOSIS — C50919 Malignant neoplasm of unspecified site of unspecified female breast: Secondary | ICD-10-CM

## 2012-02-15 DIAGNOSIS — C50319 Malignant neoplasm of lower-inner quadrant of unspecified female breast: Secondary | ICD-10-CM | POA: Insufficient documentation

## 2012-02-15 HISTORY — PX: RE-EXCISION OF BREAST CANCER,SUPERIOR MARGINS: SHX6047

## 2012-02-15 SURGERY — RE-EXCISION OF BREAST CANCER,SUPERIOR MARGINS
Anesthesia: General | Site: Breast | Laterality: Right | Wound class: Clean

## 2012-02-15 MED ORDER — DEXAMETHASONE SODIUM PHOSPHATE 4 MG/ML IJ SOLN
INTRAMUSCULAR | Status: DC | PRN
  Administered 2012-02-15: 8 mg via INTRAVENOUS

## 2012-02-15 MED ORDER — PROMETHAZINE HCL 25 MG/ML IJ SOLN
6.2500 mg | INTRAMUSCULAR | Status: DC | PRN
  Administered 2012-02-15: 6.25 mg via INTRAVENOUS

## 2012-02-15 MED ORDER — ONDANSETRON HCL 4 MG/2ML IJ SOLN
INTRAMUSCULAR | Status: DC | PRN
  Administered 2012-02-15: 4 mg via INTRAVENOUS

## 2012-02-15 MED ORDER — OXYCODONE HCL 5 MG PO TABS: 5.0000 mg | ORAL_TABLET | Freq: Once | ORAL | Status: DC | PRN

## 2012-02-15 MED ORDER — CHLORHEXIDINE GLUCONATE 4 % EX LIQD: 1.0000 "application " | Freq: Once | CUTANEOUS | Status: DC

## 2012-02-15 MED ORDER — LACTATED RINGERS IV SOLN
INTRAVENOUS | Status: DC
  Administered 2012-02-15 (×3): via INTRAVENOUS
  Administered 2012-02-15: 20 mL/h via INTRAVENOUS

## 2012-02-15 MED ORDER — HEPARIN SODIUM (PORCINE) 5000 UNIT/ML IJ SOLN
5000.0000 [IU] | Freq: Once | INTRAMUSCULAR | Status: AC
  Administered 2012-02-15: 5000 [IU] via SUBCUTANEOUS

## 2012-02-15 MED ORDER — BUPIVACAINE-EPINEPHRINE 0.5% -1:200000 IJ SOLN
INTRAMUSCULAR | Status: DC | PRN
  Administered 2012-02-15: 14 mL

## 2012-02-15 MED ORDER — EPHEDRINE SULFATE 50 MG/ML IJ SOLN
INTRAMUSCULAR | Status: DC | PRN
  Administered 2012-02-15: 10 mg via INTRAVENOUS
  Administered 2012-02-15: 15 mg via INTRAVENOUS

## 2012-02-15 MED ORDER — VANCOMYCIN HCL 500 MG IV SOLR: 500.0000 mg | Freq: Once | INTRAVENOUS | Status: DC

## 2012-02-15 MED ORDER — FENTANYL CITRATE 0.05 MG/ML IJ SOLN
INTRAMUSCULAR | Status: DC | PRN
  Administered 2012-02-15: 100 ug via INTRAVENOUS
  Administered 2012-02-15 (×4): 50 ug via INTRAVENOUS

## 2012-02-15 MED ORDER — PROPOFOL 10 MG/ML IV BOLUS
INTRAVENOUS | Status: DC | PRN
  Administered 2012-02-15 (×2): 40 mg via INTRAVENOUS
  Administered 2012-02-15: 200 mg via INTRAVENOUS

## 2012-02-15 MED ORDER — VANCOMYCIN HCL 1000 MG IV SOLR
1000.0000 mg | INTRAVENOUS | Status: DC | PRN
  Administered 2012-02-15: 1500 mg via INTRAVENOUS

## 2012-02-15 MED ORDER — MEPERIDINE HCL 25 MG/ML IJ SOLN: 6.2500 mg | INTRAMUSCULAR | Status: DC | PRN

## 2012-02-15 MED ORDER — OXYCODONE-ACETAMINOPHEN 5-325 MG PO TABS: 1.0000 | ORAL_TABLET | ORAL | Status: DC | PRN

## 2012-02-15 MED ORDER — MIDAZOLAM HCL 2 MG/ML PO SYRP: 0.5000 mg/kg | ORAL_SOLUTION | Freq: Once | ORAL | Status: DC | PRN

## 2012-02-15 MED ORDER — MIDAZOLAM HCL 5 MG/5ML IJ SOLN
INTRAMUSCULAR | Status: DC | PRN
  Administered 2012-02-15 (×2): 1 mg via INTRAVENOUS

## 2012-02-15 MED ORDER — DEXTROSE 5 % IV SOLN: 3.0000 g | INTRAVENOUS | Status: DC

## 2012-02-15 MED ORDER — FENTANYL CITRATE 0.05 MG/ML IJ SOLN: 50.0000 ug | INTRAMUSCULAR | Status: DC | PRN

## 2012-02-15 MED ORDER — OXYCODONE HCL 5 MG/5ML PO SOLN: 5.0000 mg | Freq: Once | ORAL | Status: DC | PRN

## 2012-02-15 MED ORDER — LIDOCAINE HCL (CARDIAC) 20 MG/ML IV SOLN
INTRAVENOUS | Status: DC | PRN
  Administered 2012-02-15: 75 mg via INTRAVENOUS

## 2012-02-15 MED ORDER — MIDAZOLAM HCL 2 MG/2ML IJ SOLN: 1.0000 mg | INTRAMUSCULAR | Status: DC | PRN

## 2012-02-15 MED ORDER — HYDROMORPHONE HCL PF 1 MG/ML IJ SOLN
0.2500 mg | INTRAMUSCULAR | Status: DC | PRN
  Administered 2012-02-15 (×4): 0.5 mg via INTRAVENOUS

## 2012-02-15 MED ORDER — MIDAZOLAM HCL 2 MG/2ML IJ SOLN: 0.5000 mg | Freq: Once | INTRAMUSCULAR | Status: DC | PRN

## 2012-02-15 SURGICAL SUPPLY — 53 items
APPLIER CLIP 9.375 MED OPEN (MISCELLANEOUS) ×2
BANDAGE ELASTIC 6 VELCRO ST LF (GAUZE/BANDAGES/DRESSINGS) IMPLANT
BENZOIN TINCTURE PRP APPL 2/3 (GAUZE/BANDAGES/DRESSINGS) IMPLANT
BLADE HEX COATED 2.75 (ELECTRODE) ×2 IMPLANT
BLADE SURG 15 STRL LF DISP TIS (BLADE) ×1 IMPLANT
BLADE SURG 15 STRL SS (BLADE) ×1
CANISTER SUCTION 1200CC (MISCELLANEOUS) ×2 IMPLANT
CHLORAPREP W/TINT 26ML (MISCELLANEOUS) ×2 IMPLANT
CLIP APPLIE 9.375 MED OPEN (MISCELLANEOUS) ×1 IMPLANT
CLOTH BEACON ORANGE TIMEOUT ST (SAFETY) ×2 IMPLANT
COVER MAYO STAND STRL (DRAPES) ×2 IMPLANT
COVER TABLE BACK 60X90 (DRAPES) ×2 IMPLANT
DECANTER SPIKE VIAL GLASS SM (MISCELLANEOUS) ×2 IMPLANT
DERMABOND ADVANCED (GAUZE/BANDAGES/DRESSINGS) ×1
DERMABOND ADVANCED .7 DNX12 (GAUZE/BANDAGES/DRESSINGS) ×1 IMPLANT
DRAPE LAPAROSCOPIC ABDOMINAL (DRAPES) IMPLANT
DRAPE LAPAROTOMY TRNSV 102X78 (DRAPE) IMPLANT
DRAPE PED LAPAROTOMY (DRAPES) ×2 IMPLANT
DRAPE UTILITY XL STRL (DRAPES) ×2 IMPLANT
ELECT REM PT RETURN 9FT ADLT (ELECTROSURGICAL) ×2
ELECTRODE REM PT RTRN 9FT ADLT (ELECTROSURGICAL) ×1 IMPLANT
GAUZE SPONGE 4X4 12PLY STRL LF (GAUZE/BANDAGES/DRESSINGS) IMPLANT
GAUZE SPONGE 4X4 16PLY XRAY LF (GAUZE/BANDAGES/DRESSINGS) IMPLANT
GLOVE EUDERMIC 7 POWDERFREE (GLOVE) ×2 IMPLANT
GOWN PREVENTION PLUS XLARGE (GOWN DISPOSABLE) ×2 IMPLANT
GOWN PREVENTION PLUS XXLARGE (GOWN DISPOSABLE) ×2 IMPLANT
KIT MARKER MARGIN INK (KITS) ×2 IMPLANT
NEEDLE HYPO 22GX1.5 SAFETY (NEEDLE) IMPLANT
NEEDLE HYPO 25X1 1.5 SAFETY (NEEDLE) ×2 IMPLANT
NS IRRIG 1000ML POUR BTL (IV SOLUTION) ×2 IMPLANT
PACK BASIN DAY SURGERY FS (CUSTOM PROCEDURE TRAY) ×2 IMPLANT
PENCIL BUTTON HOLSTER BLD 10FT (ELECTRODE) ×2 IMPLANT
SLEEVE SCD COMPRESS KNEE MED (MISCELLANEOUS) ×2 IMPLANT
SPONGE LAP 4X18 X RAY DECT (DISPOSABLE) ×4 IMPLANT
STAPLER VISISTAT 35W (STAPLE) IMPLANT
STRIP CLOSURE SKIN 1/2X4 (GAUZE/BANDAGES/DRESSINGS) IMPLANT
SUT ETHILON 4 0 PS 2 18 (SUTURE) IMPLANT
SUT MON AB 4-0 PC3 18 (SUTURE) ×2 IMPLANT
SUT SILK 2 0 SH (SUTURE) ×2 IMPLANT
SUT VIC AB 2-0 SH 27 (SUTURE)
SUT VIC AB 2-0 SH 27XBRD (SUTURE) IMPLANT
SUT VIC AB 3-0 FS2 27 (SUTURE) IMPLANT
SUT VIC AB 4-0 P-3 18XBRD (SUTURE) IMPLANT
SUT VIC AB 4-0 P3 18 (SUTURE)
SUT VICRYL 3-0 CR8 SH (SUTURE) ×2 IMPLANT
SUT VICRYL 4-0 PS2 18IN ABS (SUTURE) IMPLANT
SYR BULB 3OZ (MISCELLANEOUS) ×2 IMPLANT
SYR CONTROL 10ML LL (SYRINGE) ×2 IMPLANT
TAPE HYPAFIX 4 X10 (GAUZE/BANDAGES/DRESSINGS) IMPLANT
TOWEL OR NON WOVEN STRL DISP B (DISPOSABLE) ×2 IMPLANT
TUBE CONNECTING 20X1/4 (TUBING) ×2 IMPLANT
WATER STERILE IRR 1000ML POUR (IV SOLUTION) IMPLANT
YANKAUER SUCT BULB TIP NO VENT (SUCTIONS) ×2 IMPLANT

## 2012-02-15 NOTE — Anesthesia Postprocedure Evaluation (Signed)
  Anesthesia Post-op Note  Patient: Amanda Hoover  Procedure(s) Performed: Procedure(s) (LRB) with comments: RE-EXCISION OF BREAST CANCER,SUPERIOR MARGINS (Right) - Re-excision of right lumpectomy, close posterior margin.  Patient Location: PACU  Anesthesia Type:General  Level of Consciousness: awake, alert , oriented and patient cooperative  Airway and Oxygen Therapy: Patient Spontanous Breathing  Post-op Pain: none  Post-op Assessment: Post-op Vital signs reviewed, Patient's Cardiovascular Status Stable, Respiratory Function Stable, Patent Airway, No signs of Nausea or vomiting and Pain level controlled  Post-op Vital Signs: Reviewed and stable  Complications: No apparent anesthesia complications

## 2012-02-15 NOTE — Transfer of Care (Signed)
Immediate Anesthesia Transfer of Care Note  Patient: Amanda Hoover  Procedure(s) Performed: Procedure(s) (LRB): RE-EXCISION OF BREAST CANCER,SUPERIOR MARGINS (Right)  Patient Location: Patient transported to PACU with oxygen via face mask at 4 Liters / Min  Anesthesia Type: General  Level of Consciousness: awake and alert   Airway & Oxygen Therapy: Patient Spontanous Breathing and Patient connected to face mask oxygen  Post-op Assessment: Report given to PACU RN and Post -op Vital signs reviewed and stable  Post vital signs: Reviewed and stable  Dentition: Teeth and oropharynx remain in pre-op condition  Complications: No apparent anesthesia complications

## 2012-02-15 NOTE — Anesthesia Preprocedure Evaluation (Addendum)
Anesthesia Evaluation  Patient identified by MRN, date of birth, ID band Patient awake    Reviewed: Allergy & Precautions, H&P , NPO status , Patient's Chart, lab work & pertinent test results  History of Anesthesia Complications Negative for: history of anesthetic complications  Airway Mallampati: II TM Distance: >3 FB Neck ROM: Full    Dental No notable dental hx. (+) Teeth Intact and Dental Advisory Given   Pulmonary former smoker,  breath sounds clear to auscultation  Pulmonary exam normal       Cardiovascular negative cardio ROS  Rhythm:Regular Rate:Normal  '10 stress test: no ischemia, EF 65%   Neuro/Psych  Headaches, negative psych ROS   GI/Hepatic Neg liver ROS, hiatal hernia, GERD-  Medicated and Poorly Controlled,  Endo/Other  Morbid obesity  Renal/GU negative Renal ROS     Musculoskeletal   Abdominal (+) + obese,   Peds  Hematology   Anesthesia Other Findings Breast cancer  Reproductive/Obstetrics                          Anesthesia Physical Anesthesia Plan  ASA: II  Anesthesia Plan: General   Post-op Pain Management:    Induction: Intravenous  Airway Management Planned: Oral ETT  Additional Equipment:   Intra-op Plan:   Post-operative Plan: Extubation in OR  Informed Consent: I have reviewed the patients History and Physical, chart, labs and discussed the procedure including the risks, benefits and alternatives for the proposed anesthesia with the patient or authorized representative who has indicated his/her understanding and acceptance.   Dental advisory given  Plan Discussed with: CRNA and Surgeon  Anesthesia Plan Comments: (Plan routine monitors, GETA Patient c/o reflux last night)        Anesthesia Quick Evaluation

## 2012-02-15 NOTE — Interval H&P Note (Signed)
History and Physical Interval Note:  02/15/2012 11:47 AM  Amanda Hoover  has presented today for surgery, with the diagnosis of Breast Cancer  The goals and the various methods of treatment have been discussed with the patient and family. After consideration of risks, benefits and other options for treatment, the patient has consented to  Procedure(s) (LRB) with comments: RE-EXCISION OF BREAST CANCER,SUPERIOR MARGINS (Right) - right partial mastectomy re-excision posterior margin as a surgical intervention .  The patient's history has been reviewed, patient examined today , no change in status, stable for surgery.  I have reviewed the patient's chart and labs.  Questions were answered to the patient's satisfaction.     Ernestene Mention

## 2012-02-15 NOTE — Anesthesia Procedure Notes (Signed)
Procedure Name: Intubation Date/Time: 02/15/2012 12:06 PM Performed by: Fran Lowes Pre-anesthesia Checklist: Patient identified, Emergency Drugs available, Suction available and Patient being monitored Patient Re-evaluated:Patient Re-evaluated prior to inductionOxygen Delivery Method: Circle System Utilized Preoxygenation: Pre-oxygenation with 100% oxygen Intubation Type: IV induction Ventilation: Mask ventilation without difficulty Laryngoscope Size: Mac and 3 Grade View: Grade I Tube type: Oral Tube size: 7.0 mm Number of attempts: 2 Airway Equipment and Method: stylet and oral airway Placement Confirmation: ETT inserted through vocal cords under direct vision,  positive ETCO2 and breath sounds checked- equal and bilateral Secured at: 22 cm Tube secured with: Tape Dental Injury: Teeth and Oropharynx as per pre-operative assessment  Comments: 1st attempt cords closed, 2nd by dr. Jean Rosenthal successful x1.

## 2012-02-15 NOTE — Op Note (Signed)
Patient Name:           Amanda Hoover   Date of Surgery:        02/15/2012  Pre op Diagnosis:      Microinvasive ductal carcinoma and noninvasive ductal carcinoma right breast, medial aspect, high-grade, receptor negative, node-negative. Pathologic stage T55mic, N0. Focally close posterior margin  Post op Diagnosis:    Same  Procedure:                 Right partial mastectomy with reexcision of posterior margin  Surgeon:                     Angelia Mould. Derrell Lolling, M.D., FACS  Assistant:                      None  Operative Indications: She recently underwent a right partial mastectomy with needle localization and right axillary sentinel node biopsy. Final pathology report shows microinvasive ductal carcinoma and ductal carcinoma in situ, high-grade, receptor negative. The in situ cancer is focally less than 1 mm to the posterior margin. The nodes are negative. Pathologic stage TImic., N0.  I have discussed this with the patient and have discussed with Dr. Roselind Messier as to whether we need to re excise this area or simply radiate it. She had an appointment with Dr. Roselind Messier on November 11. The patient states that she is strongly motivated to have this area reexcised because of the breast cancer in her grandmother and aunt.     Operative Findings:       There was no gross evidence of cancer. The lumpectomy cavity was easily identified. I resected a 1 cm margin posterior to the lumpectomy cavity and marked the lumpectomy cavity with metal clips.  Procedure in Detail:          Following the induction of general endotracheal anesthesia the patient's right breast was prepped and draped in a sterile fashion. Intravenous antibiotics were given. Surgical time out was performed. 0.5% Marcaine with epinephrine was used as local infiltration anesthetic. A transverse elliptical incision was made, excising the old scar. Dissection was carried down into the breast tissue conservatively superiorly inferiorly medially and  laterally around the lumpectomy cavity. As I get more posterior I made a wider margin with electrocautery. The specimen was removed. The anterior margin was marked by the skin. Posterior margin, medial margin, and lateral margin were marked with ink and sent for routine histology. Hemostasis was excellent and achieved with electrocautery. The wound was irrigated with saline. The cavity was marked with a metal marking clips in the usual fashion. The deeper breast tissues were closed with interrupted sutures of 3-0 Vicryl. The superficial breast tissues were closed with interrupted sutures of 3-0 Vicryl. The skin was closed with a running subcuticular suture of 4-0 Monocryl and Dermabond. The patient tolerated the procedure well and was taken to recovery in stable condition. EBL 15 CCP counts correct. Complications none.     Angelia Mould. Derrell Lolling, M.D., FACS General and Minimally Invasive Surgery Breast and Colorectal Surgery  02/15/2012 12:49 PM

## 2012-02-16 ENCOUNTER — Encounter (HOSPITAL_BASED_OUTPATIENT_CLINIC_OR_DEPARTMENT_OTHER): Payer: Self-pay | Admitting: General Surgery

## 2012-02-16 ENCOUNTER — Telehealth: Payer: Self-pay

## 2012-02-16 NOTE — Telephone Encounter (Signed)
Patient called and informed of appointment with Dr.Kinard on 02/28/12.To arrive at 9:30 am for nurse evaluation and doctor afterwards.

## 2012-02-17 ENCOUNTER — Telehealth (INDEPENDENT_AMBULATORY_CARE_PROVIDER_SITE_OTHER): Payer: Self-pay | Admitting: General Surgery

## 2012-02-17 NOTE — Telephone Encounter (Signed)
Called patient back to advise her message was received regarding post op appointment to be scheduled. I advised the patient that as soon as I obtain reply from Dr. Derrell Lolling regarding confirmation of date, I will contact her to advise. Patient stated she was told in the hospital 4 weeks post op and she wanted to confirm.

## 2012-02-18 ENCOUNTER — Telehealth (INDEPENDENT_AMBULATORY_CARE_PROVIDER_SITE_OTHER): Payer: Self-pay | Admitting: General Surgery

## 2012-02-18 NOTE — Progress Notes (Signed)
Quick Note:  Inform patient of Pathology report,. Tell her there was no residual cancer, which is excellent news. Tell her she will not need any further surgery. In terms of followup with me it does not have to be 2 weeks from now, it could be 3 or 4 weeks from now. ______

## 2012-02-18 NOTE — Telephone Encounter (Signed)
Patient called in regard to post op appointment to be scheduled (which was already discussed with the patient yesterday). Advised patient she will see Dr. Derrell Lolling on 03/13/12 at 8:15. Advised patient of pathology report as well per Dr. Jacinto Halim request "tell her there was no residual cancer, which is excellent news. Tell her she will not need any further surgery".

## 2012-02-22 ENCOUNTER — Ambulatory Visit (HOSPITAL_BASED_OUTPATIENT_CLINIC_OR_DEPARTMENT_OTHER): Payer: BC Managed Care – PPO | Admitting: Oncology

## 2012-02-22 ENCOUNTER — Telehealth: Payer: Self-pay | Admitting: *Deleted

## 2012-02-22 VITALS — BP 123/83 | HR 70 | Temp 97.6°F | Resp 20 | Ht 64.0 in | Wt 236.3 lb

## 2012-02-22 DIAGNOSIS — C50319 Malignant neoplasm of lower-inner quadrant of unspecified female breast: Secondary | ICD-10-CM

## 2012-02-22 NOTE — Progress Notes (Signed)
ID: [name]   DOB: 1955/10/30  MR#: [id number]  WUJ#:[id number]  PCP: [name], MD GYN: Melbourne Abts MD SU: Claud Kelp MD OTHER MD: Antony Blackbird, Claudette Head   HISTORY OF PRESENT ILLNESS: Amanda Hoover had routine bilateral screening mammography 12/02/2011 showing heterogeneously dense breasts. New calcifications were noted in the right breast, and were further evaluated with right diagnostic mammography 12/10/2011. This confirmed a group of pleomorphic microcalcifications in the lower inner quadrant of the right breast. 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 On 12/24/2011 the patient underwent bilateral breast MRI. This showed post biopsy changes in the right lower inner quadrant, associated with an area of clumped nodular enhancement measuring up to 3.7 cm.  The patient's subsequent history is as detailed below.  INTERVAL HISTORY: Amanda Hoover returns today with her son Amanda Hoover for followup of her breast cancer. Since her last visit here she has had her definitive surgery. She had some pain of from it, with a significant reaction to both hydrocodone and oxycodone, (facial flushing and mouth blistering). That has resolved.  REVIEW OF SYSTEMS: She had no unusual bleeding or fever from the additional surgery earlier this month. She has had some shooting pains in her right breast, which are very brief, and not associated with erythema or swelling. A detailed review of systems today was otherwise entirely negative.  PAST MEDICAL HISTORY: Past Medical History  Diagnosis Date  . GERD (gastroesophageal reflux disease)   . Hiatal hernia   . Osteopenia   . History of migraine   . Headache     stress, sinus headaches  . Multiple environmental allergies   . History of colon polyps   . History of esophageal  stricture     has had esophageal dilation x 1  . Palpitations     occasional; worse with ingestion of caffeine; no cardiologist  . Depression     no current meds.  . Dental crowns present   . Rash 01/06/2012    right elbow  . Rhinitis, allergic     rhinitis  . Hiatal hernia   . Chronic headaches   . Osteopenia   . 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  . Breast cancer, right     ER 0 PR0 LN neg.    PAST SURGICAL HISTORY: Past Surgical History  Procedure Date  . Colonoscopy w/ polypectomy   . Partial mastectomy with needle localization and axillary sentinel lymph node bx 01/12/2012    Procedure: PARTIAL MASTECTOMY WITH NEEDLE LOCALIZATION AND AXILLARY SENTINEL LYMPH NODE BX;  Surgeon: Ernestene Mention, MD;  Location: [facility];  Service: General;  Laterality: Right;  . Esophagogastroduodenoscopy   . Re-excision of breast cancer,superior margins 02/15/2012    Procedure: RE-EXCISION OF BREAST CANCER,SUPERIOR MARGINS;  Surgeon: Ernestene Mention, MD;  Location: [facility];  Service: General;  Laterality: Right;  Re-excision of right lumpectomy, close posterior margin.    FAMILY HISTORY Family History  Problem Relation Age of Onset  . Breast cancer    . Liver disease    . Crohn's disease Mother   . Irritable bowel syndrome Mother   . Esophageal cancer Father   . Cancer Father     esophageal  . Breast cancer Paternal Aunt   . Cancer Paternal Aunt     breast cancer  .  Diabetes Maternal Grandmother   . Breast cancer Paternal Grandmother   . Diabetes Paternal Grandmother   . Cancer Paternal Grandmother     breast    the patient's father died of cancer of the esophagus at age 52. He had been diagnosed with this shortly before. [name] is currently 59 years old (as of October 2013). [name] was diagnosed with breast cancer at the age of 69. [name] was  also diagnosed with breast cancer, at the age of 35. [name] (her paternal grandmothers only sister) was diagnosed with breast cancer in her 47s. The patient had one brother who died from a viral illness at the age of 63. There is no history of ovarian cancer in the family.  GYNECOLOGIC HISTORY: Menarche age 65, last menstrual period at age 9. The patient took hormone replacement for approximately one year. She is GX P4, first live birth age 61  SOCIAL HISTORY: (updated October 2013) Sole works as a Teacher, adult education. Her husband Amanda Hoover works in the [employer]. Son Amanda Hoover is a former and [employer] rated [employer] he is an Economist. Son Amanda Hoover lives in [place] where he works as a Land. Daughter Amanda Hoover [employer] as a Naval architect in [employer]. The patient has 3 grandchildren and one on the way. She attends a [employer].   ADVANCED DIRECTIVES: in place  HEALTH MAINTENANCE: History  Substance Use Topics  . Smoking status: Former Smoker -- 0.5 packs/day for 5 years    Types: Cigarettes    Quit date: 03/15/2000  . Smokeless tobacco: Never Used  . Alcohol Use: No     Colonoscopy: 2013  PAP: 2012  Bone density: 08/17/2010/ T - 2.1  Lipid panel:  Allergies  Allergen Reactions  . Macadamia Nut Oil Anaphylaxis  . Other Swelling    POPCORN:  SWELLING THROAT  . Peanut-Containing Drug Products Swelling and Other (See Comments)    THROAT TIGHTNESS; ALSO WALNUTS  . Tomato Swelling    SWELLING UVULA  . Adhesive (Tape) Other (See Comments)    BLISTERS  . Flour Other (See Comments)    SNEEZING  . Penicillins Hives  . Hydrocodone Hives  . Vicodin (Hydrocodone-Acetaminophen) Swelling    Pt had flushing, then itching and swelling of lips  . Eggs Or Egg-Derived Products Other (See Comments)    POSITIVE ON ALLERGY TEST, BUT EATS EGGS WITHOUT PROBLEM  . Soap Other (See Comments)    FRAGRANCES  (SOAP/LOTION/PERFUME):  HEADACHE, RUNNY NOSE  . Tramadol Nausea And Vomiting    Current Outpatient Prescriptions  Medication Sig Dispense Refill  . Calcium-Vitamin D (CALTRATE 600 PLUS-VIT D PO) Take by mouth.        . Cholecalciferol (VITAMIN D-3 PO) Take 5,000 Units by mouth.        . cyclobenzaprine (FLEXERIL) 10 MG tablet Take 10 mg by mouth as needed.      . DiphenhydrAMINE HCl (BENADRYL PO) Take by mouth as needed.      . Multiple Vitamin (MULTIVITAMIN) tablet Take 1 tablet by mouth daily.      Marland Kitchen oxyCODONE-acetaminophen (ROXICET) 5-325 MG per tablet Take 1 tablet by mouth every 4 (four) hours as needed for pain.  30 tablet  0  . pantoprazole (PROTONIX) 40 MG tablet Take 40 mg by mouth 2 (two) times daily.         OBJECTIVE: Middle-aged white woman who  appears well Filed Vitals:   02/22/12 1510  BP: 123/83  Pulse: 70  Temp: 97.6 F (36.4 C)  Resp: 20     Body mass index is 40.56 kg/(m^2).    ECOG FS: 0 Sclerae unicteric Oropharynx clear No cervical or supraclavicular adenopathy Lungs no rales or rhonchi Heart regular rate and rhythm Abd benign MSK no focal spinal tenderness, no peripheral edema Neuro: nonfocal Breasts: The right breast is status post lumpectomy. There is some distortion to the shape of the breast anteriorly, but no dehiscence, erythema, or unusual swelling. The right axilla is benign. The left breast is unremarkable.   LAB RESULTS: Lab Results  Component Value Date   WBC 5.3 12/29/2011   NEUTROABS 2.8 12/29/2011   HGB 13.3 02/15/2012   HCT 41.5 12/29/2011   MCV 94.5 12/29/2011   PLT 154 12/29/2011      Chemistry      Component Value Date/Time   NA 139 12/29/2011 1304   NA 139 02/21/2009 0605   NA 138 08/30/2008   K 3.5 12/29/2011 1304   K 3.7 02/21/2009 0605   CL 106 12/29/2011 1304   CL 109 02/21/2009 0605   CO2 23 12/29/2011 1304   CO2 24 02/21/2009 0605   BUN 11.0 12/29/2011 1304   BUN 8 02/21/2009 0605   BUN 12 08/30/2008    CREATININE 0.9 12/29/2011 1304   CREATININE 0.93 02/21/2009 0605   CREATININE 1.0 08/30/2008   GLU 99 08/30/2008      Component Value Date/Time   CALCIUM 9.9 12/29/2011 1304   CALCIUM 9.0 02/21/2009 0605   ALKPHOS 125 12/29/2011 1304   ALKPHOS 106 02/20/2009 1210   AST 41* 12/29/2011 1304   AST 39* 02/20/2009 1210   ALT 46 12/29/2011 1304   ALT 52* 02/20/2009 1210   BILITOT 0.70 12/29/2011 1304   BILITOT 0.8 02/20/2009 1210       No results found for this basename: LABCA2    No components found with this basename: LABCA125    No results found for this basename: INR:1;PROTIME:1 in the last 168 hours  Urinalysis    Component Value Date/Time   COLORURINE YELLOW 02/20/2009 1131   APPEARANCEUR CLEAR 02/20/2009 1131   LABSPEC 1.010 02/20/2009 1131   PHURINE 6.5 02/20/2009 1131   GLUCOSEU NEGATIVE 02/20/2009 1131   HGBUR NEGATIVE 02/20/2009 1131   BILIRUBINUR NEGATIVE 02/20/2009 1131   KETONESUR NEGATIVE 02/20/2009 1131   PROTEINUR NEGATIVE 02/20/2009 1131   UROBILINOGEN 0.2 02/20/2009 1131   NITRITE NEGATIVE 02/20/2009 1131   LEUKOCYTESUR NEGATIVE MICROSCOPIC NOT DONE ON URINES WITH NEGATIVE PROTEIN, BLOOD, LEUKOCYTES, NITRITE, OR GLUCOSE <1000 mg/dL. 02/20/2009 1131    STUDIES: Mr Breast Bilateral W Wo Contrast  12/24/2011  *RADIOLOGY REPORT*  Clinical Data: Newly-diagnosed right lower inner quadrant DCIS with foci of possible invasion manifesting as mammographically detected calcifications.  BILATERAL BREAST MRI WITH AND WITHOUT CONTRAST  Technique: Multiplanar, multisequence MR images of both breasts were obtained prior to and following the intravenous administration of 20ml of Multihance.  Three dimensional images were evaluated at the independent DynaCad workstation.  Comparison:  Prior mammograms  Findings: Right lower inner quadrant post biopsy changes are noted with clip artifact.  No other abnormal T2-weighted hyperintensity is identified in either breast.  No lymphadenopathy.   Background enhancement pattern is minimal.  In the right lower inner quadrant, surrounding post biopsy change and clip artifact, is an area of segmental clumped nodular enhancement demonstrating predominately plateau type kinetics measuring overall 3.7 x  2.5 x 2.4 cm.  This corresponds to the area of biopsy-proven DCIS.  No other area of abnormal enhancement is seen in either breast.  IMPRESSION: Segmental clumped nodular enhancement measuring 3.7 cm in maximal anteroposterior dimension in the right breast lower inner quadrant at the site of biopsy-proven DCIS.  No other area of abnormal enhancement in either breast to suggest malignancy elsewhere.  RECOMMENDATION: Treatment plan  THREE-DIMENSIONAL MR IMAGE RENDERING ON INDEPENDENT WORKSTATION:  Three-dimensional MR images were rendered by post-processing of the original MR data on an independent workstation.  The three- dimensional MR images were interpreted, and findings were reported in the accompanying complete MRI report for this study.  BI-RADS CATEGORY 6:  Known biopsy-proven malignancy - appropriate action should be taken.   Original Report Authenticated By: Harrel Lemon, M.D.    Mm Breast Stereo Biopsy Right  12/17/2011  *RADIOLOGY REPORT*  Clinical Data:  Suspicious coarse heterogeneous right lower inner quadrant calcifications  STEREOTACTIC-GUIDED VACUUM ASSISTED BIOPSY OF THE RIGHT BREAST AND SPECIMEN RADIOGRAPH  Comparison: Previous exams.  I met with the patient and we discussed the procedure of stereotactic-guided biopsy, including benefits and alternatives. We discussed the high likelihood of a successful procedure. We discussed the risks of the procedure, including infection, bleeding, tissue injury, clip migration, and inadequate sampling. Informed, written consent was given.  Using sterile technique, 2% lidocaine, stereotactic guidance, and a 9 gauge vacuum assisted device, biopsy was performed of right lower inner quadrant calcifications  using a inferior to superior approach.  Specimen radiograph was performed, showing calcifications in the biopsy samples.  Specimens with calcifications are identified for pathology.  At the conclusion of the procedure, a T shaped tissue marker clip was deployed into the biopsy cavity.  Follow-up 2-view mammogram confirmed clip placement at the biopsy site.  IMPRESSION:  Stereotactic-guided biopsy of right lower inner quadrant calcifications, with clip placement.  Pathology is pending.  No apparent complications.   Original Report Authenticated By: Harrel Lemon, M.D.    Mm Breast Surgical Specimen  12/17/2011  *RADIOLOGY REPORT*  Clinical Data:  Suspicious coarse heterogeneous right lower inner quadrant calcifications  STEREOTACTIC-GUIDED VACUUM ASSISTED BIOPSY OF THE RIGHT BREAST AND SPECIMEN RADIOGRAPH  Comparison: Previous exams.  I met with the patient and we discussed the procedure of stereotactic-guided biopsy, including benefits and alternatives. We discussed the high likelihood of a successful procedure. We discussed the risks of the procedure, including infection, bleeding, tissue injury, clip migration, and inadequate sampling. Informed, written consent was given.  Using sterile technique, 2% lidocaine, stereotactic guidance, and a 9 gauge vacuum assisted device, biopsy was performed of right lower inner quadrant calcifications using a inferior to superior approach.  Specimen radiograph was performed, showing calcifications in the biopsy samples.  Specimens with calcifications are identified for pathology.  At the conclusion of the procedure, a T shaped tissue marker clip was deployed into the biopsy cavity.  Follow-up 2-view mammogram confirmed clip placement at the biopsy site.  IMPRESSION:  Stereotactic-guided biopsy of right lower inner quadrant calcifications, with clip placement.  Pathology is pending.  No apparent complications.   Original Report Authenticated By: Harrel Lemon, M.D.     Mm Digital Diag Ltd R  12/10/2011  *RADIOLOGY REPORT*  Clinical Data:  Recall from screening 3-D mammography.  DIGITAL DIAGNOSTIC RIGHT BREAST MAMMOGRAM  Comparison:  12/02/2011, 05/21/2010, 03/03/2009, 01/12/2008, 10/14/2006.  Findings:  There is a group of pleomorphic microcalcifications located within the lower inner quadrant of the right breast which are  suspicious for possible DCIS.  These measure maximally 2.0 cm in diameter.  Tissue sampling is recommended.  I have discussed stereotactic core biopsy of the calcifications with the patient. This will be scheduled per patient preference.  IMPRESSION: A group of suspicious calcifications located within the lower inner quadrant of the right breast as discussed above.  Tissue sampling is recommended and stereotactic core biopsy will be scheduled.  RECOMMENDATION: Right breast stereotactic core biopsy.  BI-RADS CATEGORY 4:  Suspicious abnormality - biopsy should be considered.   Original Report Authenticated By: Rolla Plate, M.D.    Mm Digital Screening  12/03/2011  *RADIOLOGY REPORT*  Clinical Data: Screening.  DIGITAL BILATERAL SCREENING MAMMOGRAM WITH CAD  DIGITAL BREAST TOMOSYNTHESIS  Digital breast tomosynthesis images are acquired in two projections.  These images are reviewed in combination with the digital mammogram, confirming the findings below.  Comparison:  Previous exams.  Findings: Two views of each breast demonstrate heterogeneously dense tissue.  In the right breast, calcifications warrant further evaluation with magnified views.  In the left breast, no mass or malignant type calcifications are identified.  Images were processed with CAD.  IMPRESSION: Further evaluation is suggested for calcifications in the right breast.  RECOMMENDATION: Diagnostic mammogram of the right breast. (Code:FI-R-23M)  BI-RADS CATEGORY 0:  Incomplete.  Need additional imaging evaluation and/or prior mammograms for comparison.   Original Report Authenticated  By: Otilio Carpen, M.D.    Mm Radiologist Eval And Mgmt  12/20/2011  *RADIOLOGY REPORT*  ESTABLISHED PATIENT OFFICE VISIT - LEVEL II [phone])  Chief Complaint:   The patient returns for right breast stereotactic biopsy results and biopsy wound check.  History:  56 year old female with suspicious calcification of the right breast.  Stereotactic biopsy performed on 12/17/2011.  Exam:  Ecchymosis overlying the biopsy site is noted.  The biopsy site is soft and dry.  No signs of infection noted.  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 Histology correlates with imaging findings. This finding was discussed with the patient and her questions answered.  She was given further post biopsy care instructions.  Assessment and Plan:  Recommend surgery/oncology consultation.  An appointment at the [facility] has been scheduled for 12/29/2011 and the patient informed. Recommend bilateral breast MRI, which has been scheduled for 12/24/2011.   Original Report Authenticated By: Rosendo Gros, M.D.     ASSESSMENT: 56 y.o. 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 As far as the noninvasive breast cancer is concerned, since it was estrogen and progesterone receptor negative, no benefit could be expected. (However we have requested a repeat prognostic panel from the definitive surgery; results should be out later this week).  As far as the microscopic invasive disease, there was not sufficient tissue for estrogen and  progesterone determination. Again, therefore, we do not have evidence that she would benefit from antiestrogen.  On the other hand since she did develop 1 breast cancer, she is at risk of developing another, and that risk is in the range of 1% PE her. Taking antiestrogen for 5 years would reduce  that risk by half. We then discussed the possible toxicities side effects and complications of tamoxifen as compared to the aromatase inhibitors. She was given this information in writing.  Charles review all this and proceed to radiation as her next stat. She will see me again late February 2014. At that time we will make a decision whether or not she wants antiestrogen therapy for breast cancer prevention, and if so which age and she would prefer. She knows to call for any problems that may develop before the next visit.  Makana Rostad C    02/22/2012

## 2012-02-22 NOTE — Telephone Encounter (Signed)
Gave patient appointment for 04-2012

## 2012-02-28 ENCOUNTER — Ambulatory Visit
Admission: RE | Admit: 2012-02-28 | Discharge: 2012-02-28 | Disposition: A | Discharge status: Home / Self Care | Payer: BC Managed Care – PPO | Source: Ambulatory Visit | Attending: Radiation Oncology | Admitting: Radiation Oncology

## 2012-02-28 ENCOUNTER — Encounter: Payer: Self-pay | Admitting: Radiation Oncology

## 2012-02-28 VITALS — BP 115/55 | HR 70 | Temp 97.8°F | Wt 240.6 lb

## 2012-02-28 DIAGNOSIS — Z09 Encounter for follow-up examination after completed treatment for conditions other than malignant neoplasm: Secondary | ICD-10-CM | POA: Insufficient documentation

## 2012-02-28 DIAGNOSIS — C50319 Malignant neoplasm of lower-inner quadrant of unspecified female breast: Secondary | ICD-10-CM

## 2012-02-28 NOTE — Progress Notes (Signed)
Please see the Nurse Progress Note in the MD Initial Consult Encounter for this patient. 

## 2012-02-28 NOTE — Progress Notes (Signed)
Patient here follow up post re-excision of right breast which reveals no cancer residual.Final pathology shows microinvasive ductal carcinoma and ductal carcinoma in situ.

## 2012-02-28 NOTE — Progress Notes (Signed)
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  On 02/15/2012 the patient underwent reexcision in light of her close posterior margin. In the reexcision specimen there were fibrocystic changes noted and the biopsy site noted but no evidence of residual tumor.  She is doing well since her reexcision and is here to begin scheduling her radiation therapy as part of breast conserving treatment. The patient did meet with Dr. Darnelle Catalan who has recommended patient consider  adjuvant hormonal therapy after she completes her radiation therapy.                              ALLERGIES:  is allergic to macadamia nut oil; other; peanut-containing drug products; tomato; adhesive; flour; penicillins; hydrocodone; oxycodone; vicodin; eggs or egg-derived products; soap; and tramadol.  Meds: Current Outpatient Prescriptions  Medication Sig Dispense Refill  . Calcium-Vitamin D (CALTRATE 600 PLUS-VIT D PO) Take by mouth.        . Cholecalciferol (VITAMIN D-3 PO) Take 5,000 Units by mouth.        . DiphenhydrAMINE HCl (BENADRYL PO) Take by mouth as needed.      . Multiple Vitamin (MULTIVITAMIN) tablet Take 1 tablet by mouth daily.      . naproxen sodium (ANAPROX) 220 MG tablet Take 220 mg by mouth 2 (two) times daily with a meal.      . pantoprazole (PROTONIX) 40 MG tablet Take 40 mg by mouth 2 (two) times daily.         Physical Findings: The patient is in no acute distress. Patient is alert and oriented.  weight is 240 lb 9.6 oz (109.135 kg). Her temperature is 97.8 F (36.6 C). Her blood pressure is 115/55 and her pulse is 70. .  No no palpable supraclavicular or axillary adenopathy. The lungs are clear to  auscultation. The heart has a regular rhythm and rate.  The right breast area shows a scar in the 3:00 position which has surgical glue in place.  She does have some bruising in the operative site.  Lab Findings: Lab Results  Component Value Date   WBC 5.3 12/29/2011   HGB 13.3 02/15/2012   HCT 41.5 12/29/2011   MCV 94.5 12/29/2011   PLT 154 12/29/2011    @LASTCHEM @  Radiographic Findings: Mm Digital Diagnostic Unilat R  02/03/2012  **ADDENDUM** CREATED: 02/03/2012 15:38:32  Recommendation:  Bilateral diagnostic mammography is recommended September 2014  Addended by:  Harrel Lemon, M.D. on 02/03/2012 15:38:32.  **END ADDENDUM** SIGNED BY: Harrel Lemon, M.D.   02/03/2012  *RADIOLOGY REPORT*  Clinical Data:  Post lumpectomy, pre repeat excision and pre radiation mammogram for previously seen calcifications.  History of biopsy-proven right breast cancer 2013.  DIGITAL DIAGNOSTIC RIGHT MAMMOGRAM WITH CAD  Mammographic images were processed with CAD.  Comparison:  Prior exams  Findings:  Right lower inner quadrant lumpectomy changes are noted. Allowing for post lumpectomy change, no suspicious calcification is identified at the lumpectomy site.  In the central breast and right upper outer quadrant, a few scattered round calcifications are stable, allowing for differences in technique, compared to multiple prior studies dating back to at least  2009.  The right breast parenchyma demonstrates scattered fibroglandular densities.  No new suspicious finding is identified.  IMPRESSION: Findings after right lumpectomy.  Stable central and right upper outer quadrant calcifications over multiple previous years. Treatment plan is advised.  The patient will be due for bilateral mammography September 2014 to maintain her yearly screening schedule.  BI-RADS CATEGORY 2:  Benign finding(s).   Original Report Authenticated By: Christiana Pellant, M.D.     Impression:  Microinvasive ductal carcinoma of the right  breast.  The patient would be a good candidate for breast conserving therapy with radiation treatments directed at the right breast area.  Plan:  Simulation and planning on 03/20/2012 at 8 AM  _____________________________________    Billie Lade, PhD, MD

## 2012-03-03 NOTE — Addendum Note (Signed)
Encounter addended by: Delynn Flavin, RN on: 03/03/2012  6:21 PM<BR>     Documentation filed: Charges VN

## 2012-03-13 ENCOUNTER — Ambulatory Visit (INDEPENDENT_AMBULATORY_CARE_PROVIDER_SITE_OTHER): Payer: BC Managed Care – PPO | Admitting: General Surgery

## 2012-03-13 ENCOUNTER — Encounter (INDEPENDENT_AMBULATORY_CARE_PROVIDER_SITE_OTHER): Payer: Self-pay | Admitting: General Surgery

## 2012-03-13 VITALS — BP 122/70 | HR 72 | Temp 97.6°F | Resp 18 | Ht 64.5 in | Wt 239.0 lb

## 2012-03-13 DIAGNOSIS — C50319 Malignant neoplasm of lower-inner quadrant of unspecified female breast: Secondary | ICD-10-CM

## 2012-03-13 NOTE — Progress Notes (Signed)
Patient ID: Amanda Hoover, female   DOB: 1955-09-27, 56 y.o.   MRN: [id number] History: This patient underwent right partial mastectomy and sentinel node biopsy in November, 2013. She had microinvasive DCIS, receptor negative, high-grade, pathologic sore T70mic, N0. The posterior margin was very close. Options were discussed with me and with Dr. Roselind Messier. She strongly desired reexcision because of her family history. On 02/15/2012 she underwent right partial mastectomy and reexcision of the posterior margin. There was no residual malignancy. Fibrocystic changes only. She is recovering without complaints. She is going for simulation this week and to begin radiation therapy soon. She is also followed by Dr. Darnelle Catalan.  Exam: Patient looks well. No distress Right breast and right axilla incisions are well-healed. No hematoma no seroma no signs of infection no skin necrosis. There is a little volume loss medially due to the extent of resection. Range of motion right arm is 100%. No arm swelling or sensory deficit.  Assessment microinvasive,  high-grade DCIS right breast, pathologic stage T62mic, N0, receptor negative. Recovering uneventfully following right partial mastectomy, sentinel node biopsy, and subsequent reexcision posterior margin  Plan: Proceed with radiation therapy Return to see me in 6 months I told her that if there was much of a defect medially at 6-12 months, then consideration could be given to reshaping the breast with a plastic surgical consultation.   Angelia Mould. Derrell Lolling, M.D., [facility] General and Minimally invasive Surgery Breast and Colorectal Surgery Office:   [phone] Pager:   [phone]

## 2012-03-13 NOTE — Patient Instructions (Signed)
The incisions in your right breast and right axilla are healing uneventfully without any signs of any surgical complications. There was no residual cancer on the second excision.  I agree that it is okay to go ahead with radiation therapy.  Return to see Dr. Derrell Lolling in 6 months. We will see how the incision is healed and how the shape of the breast is at that time. If there is much of a deformity we can consider plastic surgical referral if desired.

## 2012-03-20 ENCOUNTER — Ambulatory Visit
Admission: RE | Admit: 2012-03-20 | Discharge: 2012-03-20 | Disposition: A | Discharge status: Home / Self Care | Payer: BC Managed Care – PPO | Source: Ambulatory Visit | Attending: Radiation Oncology | Admitting: Radiation Oncology

## 2012-03-20 DIAGNOSIS — C50319 Malignant neoplasm of lower-inner quadrant of unspecified female breast: Secondary | ICD-10-CM | POA: Insufficient documentation

## 2012-03-20 DIAGNOSIS — Z510 Encounter for antineoplastic radiation therapy: Secondary | ICD-10-CM | POA: Insufficient documentation

## 2012-03-20 DIAGNOSIS — Z79899 Other long term (current) drug therapy: Secondary | ICD-10-CM | POA: Insufficient documentation

## 2012-03-20 NOTE — Progress Notes (Signed)
  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  Identity was confirmed.  All relevant records and images related to the planned course of therapy were reviewed.  The patient freely provided informed written consent to proceed with treatment after reviewing the details related to the planned course of therapy. The consent form was witnessed and verified by the simulation staff.  Then, the patient was set-up in a stable reproducible  supine position for radiation therapy.  CT images were obtained.  Surface markings were placed.  The CT images were loaded into the planning software.  Then the target and avoidance structures were contoured.  Treatment planning then occurred.  The radiation prescription was entered and confirmed.  A total of 3 complex treatment devices were fabricated. I have requested : Isodose Plan.  I have ordered: dose calc.  PLAN:  The patient will receive 60.4 Gy in 33 fractions.  She will receive 50.4 gray directed at the right breast. She will then receive a boost to the site of presentation in the lower inner aspect of the right breast to a cumulative dose of 60.4 Gy.  ________________________________   Billie Lade, PhD, MD

## 2012-03-27 ENCOUNTER — Ambulatory Visit
Admission: RE | Admit: 2012-03-27 | Discharge: 2012-03-27 | Disposition: A | Discharge status: Home / Self Care | Payer: BC Managed Care – PPO | Source: Ambulatory Visit | Attending: Radiation Oncology | Admitting: Radiation Oncology

## 2012-03-27 DIAGNOSIS — C50319 Malignant neoplasm of lower-inner quadrant of unspecified female breast: Secondary | ICD-10-CM

## 2012-03-28 ENCOUNTER — Ambulatory Visit
Admission: RE | Admit: 2012-03-28 | Discharge: 2012-03-28 | Disposition: A | Discharge status: Home / Self Care | Payer: BC Managed Care – PPO | Source: Ambulatory Visit | Attending: Radiation Oncology | Admitting: Radiation Oncology

## 2012-03-28 ENCOUNTER — Encounter: Payer: Self-pay | Admitting: Radiation Oncology

## 2012-03-28 VITALS — BP 122/64 | HR 75 | Resp 16 | Wt 244.8 lb

## 2012-03-28 DIAGNOSIS — C50319 Malignant neoplasm of lower-inner quadrant of unspecified female breast: Secondary | ICD-10-CM

## 2012-03-28 MED ORDER — RADIAPLEXRX EX GEL
Freq: Once | CUTANEOUS | Status: AC
  Administered 2012-03-28: 11:00:00 via TOPICAL

## 2012-03-28 MED ORDER — ALRA NON-METALLIC DEODORANT (RAD-ONC)
1.0000 "application " | Freq: Once | TOPICAL | Status: AC
  Administered 2012-03-28: 1 via TOPICAL

## 2012-03-28 NOTE — Progress Notes (Signed)
Patient presented to the clinic today unaccompanied for PUT with Dr. Roselind Messier and post sim education with Sam, RN. Patient alert and oriented to person, place, and time. No distress noted. Steady gait noted. Pleasant affect noted. Patient denies pain at this time. Patient denies nausea, vomiting, headache, dizziness, or diarrhea. Patient reports difficulty getting over sinus infections. Patient denies skin changes to right/treated breast. Patient denies fatigue. Oriented patient to staff and routine of the clinic. Provide patient with radiaplex and alra then, directed upon use. Educated patient on potential side effects such as fatigue and skin changes then, educated on management. Provided patient with RADIATION THERAPY TO THE BREAST handout then, reviewed pertinent information. All questions answered. Patient verbalized understanding. Reported all findings to Dr. Roselind Messier.

## 2012-03-28 NOTE — Progress Notes (Signed)
[facility]    Radiation Oncology [address] [place]     [name], M.D. [place], [place] [address]               Billie Lade, M.D., Ph.D. Phone: [phone]      Molli Hazard A. Kathrynn Running, M.D. Fax: [phone]      Radene Gunning, M.D., Ph.D.         Lurline Hare, M.D.         Grayland Jack, M.D Weekly Treatment Management Note  Name: Amanda Hoover     MRN: [id number]        CSN: [id number] Date: 03/28/2012      DOB: 13-Dec-1955  CC: [name], MD         [place]    Status: Outpatient  Diagnosis: The encounter diagnosis was Cancer of lower-inner quadrant of female breast.  Current Dose: 1.8 Gy  Current Fraction: 1  Planned Dose: 60.4 Gy  Narrative: Amanda Hoover was seen today for weekly treatment management. The chart was checked and port films  were reviewed. She is tolerating treatment well at this time without any side effects.  Macadamia nut oil; Other; Peanut-containing drug products; Tomato; Adhesive; Flour; Penicillins; Hydrocodone; Oxycodone; Vicodin; Eggs or egg-derived products; Soap; and Tramadol  Current Outpatient Prescriptions  Medication Sig Dispense Refill  . Calcium-Vitamin D (CALTRATE 600 PLUS-VIT D PO) Take by mouth.        . Cholecalciferol (VITAMIN D-3 PO) Take 5,000 Units by mouth.        . DiphenhydrAMINE HCl (BENADRYL PO) Take by mouth as needed.      . Multiple Vitamin (MULTIVITAMIN) tablet Take 1 tablet by mouth daily.      . naproxen sodium (ANAPROX) 220 MG tablet Take 220 mg by mouth 2 (two) times daily with a meal.      . non-metallic deodorant (ALRA) MISC Apply 1 application topically daily as needed.      . pantoprazole (PROTONIX) 40 MG tablet Take 40 mg by mouth 2 (two) times daily.       . Wound Cleansers (RADIAPLEX EX) Apply topically.       Labs:  Lab Results  Component Value Date   WBC 5.3 12/29/2011   HGB 13.3 02/15/2012   HCT 41.5 12/29/2011   MCV 94.5 12/29/2011   PLT 154 12/29/2011   Lab  Results  Component Value Date   CREATININE 0.9 12/29/2011   BUN 11.0 12/29/2011   NA 139 12/29/2011   K 3.5 12/29/2011   CL 106 12/29/2011   CO2 23 12/29/2011   Lab Results  Component Value Date   ALT 46 12/29/2011   AST 41* 12/29/2011   BILITOT 0.70 12/29/2011    Physical Examination:  weight is 244 lb 12.8 oz (111.041 kg). Her blood pressure is 122/64 and her pulse is 75. Her respiration is 16.    Wt Readings from Last 3 Encounters:  03/28/12 244 lb 12.8 oz (111.041 kg)  03/13/12 239 lb (108.41 kg)  02/28/12 240 lb 9.6 oz (109.135 kg)     Lungs - Normal respiratory effort, chest expands symmetrically. Lungs are clear to auscultation, no crackles or wheezes.  Heart has regular rhythm and rate  Abdomen is soft and non tender with normal bowel sounds  Assessment:  Patient tolerating treatments well  Plan: Continue treatment per original radiation prescription

## 2012-03-28 NOTE — Progress Notes (Signed)
  Radiation Oncology         [phone] ________________________________  Name: MINYON BILLITER MRN: [id number]  Date: 03/27/2012  DOB: June 25, 1955  Simulation Verification Note  Status: outpatient  NARRATIVE: The patient was brought to the treatment unit and placed in the planned treatment position. The clinical setup was verified. Then port films were obtained and uploaded to the radiation oncology medical record software.  The treatment beams were carefully compared against the planned radiation fields. The position location and shape of the radiation fields was reviewed. They targeted volume of tissue appears to be appropriately covered by the radiation beams. Organs at risk appear to be excluded as planned.  Based on my personal review, I approved the simulation verification. The patient's treatment will proceed as planned.  -----------------------------------  Billie Lade, PhD, MD

## 2012-03-29 ENCOUNTER — Ambulatory Visit
Admission: RE | Admit: 2012-03-29 | Discharge: 2012-03-29 | Disposition: A | Discharge status: Home / Self Care | Payer: BC Managed Care – PPO | Source: Ambulatory Visit | Attending: Radiation Oncology | Admitting: Radiation Oncology

## 2012-03-30 ENCOUNTER — Ambulatory Visit
Admission: RE | Admit: 2012-03-30 | Discharge: 2012-03-30 | Disposition: A | Discharge status: Home / Self Care | Payer: BC Managed Care – PPO | Source: Ambulatory Visit | Attending: Radiation Oncology | Admitting: Radiation Oncology

## 2012-03-31 ENCOUNTER — Ambulatory Visit
Admission: RE | Admit: 2012-03-31 | Discharge: 2012-03-31 | Disposition: A | Discharge status: Home / Self Care | Payer: BC Managed Care – PPO | Source: Ambulatory Visit | Attending: Radiation Oncology | Admitting: Radiation Oncology

## 2012-04-03 ENCOUNTER — Ambulatory Visit
Admission: RE | Admit: 2012-04-03 | Discharge: 2012-04-03 | Disposition: A | Discharge status: Home / Self Care | Payer: BC Managed Care – PPO | Source: Ambulatory Visit | Attending: Radiation Oncology | Admitting: Radiation Oncology

## 2012-04-04 ENCOUNTER — Encounter: Payer: Self-pay | Admitting: Radiation Oncology

## 2012-04-04 ENCOUNTER — Ambulatory Visit
Admission: RE | Admit: 2012-04-04 | Discharge: 2012-04-04 | Disposition: A | Discharge status: Home / Self Care | Payer: BC Managed Care – PPO | Source: Ambulatory Visit | Attending: Radiation Oncology | Admitting: Radiation Oncology

## 2012-04-04 VITALS — BP 108/85 | HR 74 | Resp 18 | Wt 242.9 lb

## 2012-04-04 DIAGNOSIS — C50319 Malignant neoplasm of lower-inner quadrant of unspecified female breast: Secondary | ICD-10-CM

## 2012-04-04 NOTE — Progress Notes (Signed)
[facility]    Radiation Oncology [address] [place]     [name], M.D. [place], [place] [address]               Billie Lade, M.D., Ph.D. Phone: [phone]      Molli Hazard A. Kathrynn Running, M.D. Fax: [phone]      Radene Gunning, M.D., Ph.D.         Lurline Hare, M.D.         Grayland Jack, M.D Weekly Treatment Management Note  Name: Amanda Hoover     MRN: [id number]        CSN: [id number] Date: 04/04/2012      DOB: 05-27-1955  CC: [name], MD         [place]    Status: Outpatient  Diagnosis: The encounter diagnosis was Cancer of lower-inner quadrant of female breast.  Current Dose: 10.8 Gy  Current Fraction: 6  Planned Dose: 60.4 Gy  Narrative: Amanda Hoover was seen today for weekly treatment management. The chart was checked and port films  were reviewed. She continues to tolerate her treatments well. She occasionally will notice a sharp shooting pain within the breast area but nothing on consistent basis.  Macadamia nut oil; Other; Peanut-containing drug products; Tomato; Adhesive; Flour; Penicillins; Hydrocodone; Oxycodone; Vicodin; Eggs or egg-derived products; Soap; and Tramadol  Current Outpatient Prescriptions  Medication Sig Dispense Refill  . Calcium-Vitamin D (CALTRATE 600 PLUS-VIT D PO) Take by mouth.        . Cholecalciferol (VITAMIN D-3 PO) Take 5,000 Units by mouth.        . DiphenhydrAMINE HCl (BENADRYL PO) Take by mouth as needed.      . Multiple Vitamin (MULTIVITAMIN) tablet Take 1 tablet by mouth daily.      . naproxen sodium (ANAPROX) 220 MG tablet Take 220 mg by mouth 2 (two) times daily with a meal.      . non-metallic deodorant (ALRA) MISC Apply 1 application topically daily as needed.      . pantoprazole (PROTONIX) 40 MG tablet Take 40 mg by mouth 2 (two) times daily.       . Wound Cleansers (RADIAPLEX EX) Apply topically.       Physical Examination:  weight is 242 lb 14.4 oz (110.179 kg). Her blood pressure is  108/85 and her pulse is 74. Her respiration is 18.    Wt Readings from Last 3 Encounters:  04/04/12 242 lb 14.4 oz (110.179 kg)  03/28/12 244 lb 12.8 oz (111.041 kg)  03/13/12 239 lb (108.41 kg)    The right breast area shows some erythema particularly in the upper aspect of the breast. Lungs - Normal respiratory effort, chest expands symmetrically. Lungs are clear to auscultation, no crackles or wheezes.  Heart has regular rhythm and rate  Abdomen is soft and non tender with normal bowel sounds  Assessment:  Patient tolerating treatments well  Plan: Continue treatment per original radiation prescription

## 2012-04-04 NOTE — Progress Notes (Signed)
Patient presents to the clinic today unaccompanied for PUT with Dr. Roselind Messier. Patient alert and oriented to person, place, and time. No distress noted. Steady gait noted. Pleasant affect noted. Patient denies pain at this time. Patient reports using Radiaplex bid as directed. Patient denies skin changes at this time. Patient denies a decline of energy level at this time. Patient has no complaints at this time. Reported all findings to Dr. Roselind Messier.

## 2012-04-05 ENCOUNTER — Ambulatory Visit: Payer: BC Managed Care – PPO

## 2012-04-06 ENCOUNTER — Ambulatory Visit
Admission: RE | Admit: 2012-04-06 | Discharge: 2012-04-06 | Disposition: A | Discharge status: Home / Self Care | Payer: BC Managed Care – PPO | Source: Ambulatory Visit | Attending: Radiation Oncology | Admitting: Radiation Oncology

## 2012-04-07 ENCOUNTER — Ambulatory Visit
Admission: RE | Admit: 2012-04-07 | Discharge: 2012-04-07 | Disposition: A | Discharge status: Home / Self Care | Payer: BC Managed Care – PPO | Source: Ambulatory Visit | Attending: Radiation Oncology | Admitting: Radiation Oncology

## 2012-04-07 ENCOUNTER — Telehealth: Payer: Self-pay | Admitting: Radiation Oncology

## 2012-04-07 NOTE — Telephone Encounter (Signed)
Pt called to advise Rosann Auerbach was faxing over her Short-Term Disability forms and she does not want them completed/filled out, until she speaks with Dr. Roselind Messier on Tuesday, Jan 28.   Blank forms given to Dr. Roselind Messier today for completion and return.

## 2012-04-10 ENCOUNTER — Ambulatory Visit
Admission: RE | Admit: 2012-04-10 | Discharge: 2012-04-10 | Disposition: A | Discharge status: Home / Self Care | Payer: BC Managed Care – PPO | Source: Ambulatory Visit | Attending: Radiation Oncology | Admitting: Radiation Oncology

## 2012-04-11 ENCOUNTER — Ambulatory Visit
Admission: RE | Admit: 2012-04-11 | Discharge: 2012-04-11 | Disposition: A | Discharge status: Home / Self Care | Payer: BC Managed Care – PPO | Source: Ambulatory Visit | Attending: Radiation Oncology | Admitting: Radiation Oncology

## 2012-04-11 ENCOUNTER — Encounter: Payer: Self-pay | Admitting: Radiation Oncology

## 2012-04-11 VITALS — BP 116/74 | HR 74 | Resp 18 | Wt 245.0 lb

## 2012-04-11 DIAGNOSIS — C50319 Malignant neoplasm of lower-inner quadrant of unspecified female breast: Secondary | ICD-10-CM

## 2012-04-11 NOTE — Progress Notes (Signed)
[facility]    Radiation Oncology [address] [place]     [name], M.D. [place], [place] [address]               Billie Lade, M.D., Ph.D. Phone: [phone]      Molli Hazard A. Kathrynn Running, M.D. Fax: [phone]      Radene Gunning, M.D., Ph.D.         Lurline Hare, M.D.         Grayland Jack, M.D Weekly Treatment Management Note  Name: Amanda Hoover     MRN: [id number]        CSN: [id number] Date: 04/11/2012      DOB: 05-Dec-1955  CC: [name], MD         [place]    Status: Outpatient  Diagnosis: The encounter diagnosis was Cancer of lower-inner quadrant of female breast.  Current Dose: 16.2 Gy  Current Fraction: 9  Planned Dose: 60.4 Gy  Narrative: Amanda Hoover was seen today for weekly treatment management. The chart was checked and port films  were reviewed. The patient was seen prior to her 10th treatment in light of treatment machine issues. She is tolerating the treatment well at this time. She has noticed some itching and discomfort along the right lateral breast area.  Macadamia nut oil; Other; Peanut-containing drug products; Tomato; Adhesive; Flour; Penicillins; Hydrocodone; Oxycodone; Vicodin; Eggs or egg-derived products; Soap; and Tramadol  Current Outpatient Prescriptions  Medication Sig Dispense Refill  . Calcium-Vitamin D (CALTRATE 600 PLUS-VIT D PO) Take by mouth.        . Cholecalciferol (VITAMIN D-3 PO) Take 5,000 Units by mouth.        . DiphenhydrAMINE HCl (BENADRYL PO) Take by mouth as needed.      . Multiple Vitamin (MULTIVITAMIN) tablet Take 1 tablet by mouth daily.      . naproxen sodium (ANAPROX) 220 MG tablet Take 220 mg by mouth 2 (two) times daily with a meal.      . non-metallic deodorant (ALRA) MISC Apply 1 application topically daily as needed.      . pantoprazole (PROTONIX) 40 MG tablet Take 40 mg by mouth 2 (two) times daily.       . Wound Cleansers (RADIAPLEX EX) Apply topically.       Labs:  Lab  Results  Component Value Date   WBC 5.3 12/29/2011   HGB 13.3 02/15/2012   HCT 41.5 12/29/2011   MCV 94.5 12/29/2011   PLT 154 12/29/2011   Lab Results  Component Value Date   CREATININE 0.9 12/29/2011   BUN 11.0 12/29/2011   NA 139 12/29/2011   K 3.5 12/29/2011   CL 106 12/29/2011   CO2 23 12/29/2011   Lab Results  Component Value Date   ALT 46 12/29/2011   AST 41* 12/29/2011   BILITOT 0.70 12/29/2011    Physical Examination:  weight is 245 lb (111.131 kg). Her blood pressure is 116/74 and her pulse is 74. Her respiration is 18.    Wt Readings from Last 3 Encounters:  04/11/12 245 lb (111.131 kg)  04/04/12 242 lb 14.4 oz (110.179 kg)  03/28/12 244 lb 12.8 oz (111.041 kg)    The right breast area shows some erythema and mild hyperpigmentation changes. There is no skin breakdown appreciated Lungs - Normal respiratory effort, chest expands symmetrically. Lungs are clear to auscultation, no crackles or wheezes.  Heart has regular rhythm and rate  Abdomen is soft  and non tender with normal bowel sounds  Assessment:  Patient tolerating treatments well except for issues as above.  Plan: Continue treatment per original radiation prescription.  I have recommended patient remain on short-term disability until April 1 with anticipated side effects later during her treatment and fatigue. Her work schedule will not allow her to receive  radiation therapy and radiation therapy is very important to ensuring no recurrence.

## 2012-04-11 NOTE — Progress Notes (Signed)
Patient presents to the clinic today unaccompanied for PUT with Dr. Roselind Messier. Patient alert and oriented to person, place, and time. No distress noted. Steady gait noted. Pleasant affect noted. Patient denies pain at this time. However, patient reports soreness felt in right breast. Faint hyperpigmentation of right breast noted without desquamation. Mild radiation dermatitis noted toward the center of the chest on the upper portion of the right breast. Patient reports using radiaplex bid as directed. Encouraged patient to apply hydrocortisone 1% to itchy dermatitis. Patient verbalized understanding. Patient denies decline in energy level. Reported all findings to Dr. Roselind Messier.

## 2012-04-12 ENCOUNTER — Ambulatory Visit: Payer: BC Managed Care – PPO

## 2012-04-13 ENCOUNTER — Ambulatory Visit
Admission: RE | Admit: 2012-04-13 | Discharge: 2012-04-13 | Disposition: A | Discharge status: Home / Self Care | Payer: BC Managed Care – PPO | Source: Ambulatory Visit | Attending: Radiation Oncology | Admitting: Radiation Oncology

## 2012-04-14 ENCOUNTER — Ambulatory Visit
Admission: RE | Admit: 2012-04-14 | Discharge: 2012-04-14 | Disposition: A | Discharge status: Home / Self Care | Payer: BC Managed Care – PPO | Source: Ambulatory Visit | Attending: Radiation Oncology | Admitting: Radiation Oncology

## 2012-04-17 ENCOUNTER — Ambulatory Visit
Admission: RE | Admit: 2012-04-17 | Discharge: 2012-04-17 | Disposition: A | Discharge status: Home / Self Care | Payer: BC Managed Care – PPO | Source: Ambulatory Visit | Attending: Radiation Oncology | Admitting: Radiation Oncology

## 2012-04-18 ENCOUNTER — Ambulatory Visit
Admission: RE | Admit: 2012-04-18 | Discharge: 2012-04-18 | Disposition: A | Discharge status: Home / Self Care | Payer: BC Managed Care – PPO | Source: Ambulatory Visit | Attending: Radiation Oncology | Admitting: Radiation Oncology

## 2012-04-18 ENCOUNTER — Encounter: Payer: Self-pay | Admitting: Radiation Oncology

## 2012-04-18 VITALS — BP 112/63 | HR 71 | Temp 98.7°F | Resp 20 | Wt 248.0 lb

## 2012-04-18 DIAGNOSIS — C50319 Malignant neoplasm of lower-inner quadrant of unspecified female breast: Secondary | ICD-10-CM

## 2012-04-18 MED ORDER — BIAFINE EX EMUL
CUTANEOUS | Status: DC | PRN
  Administered 2012-04-18: 11:00:00 via TOPICAL

## 2012-04-18 NOTE — Progress Notes (Signed)
[facility]    Radiation Oncology [address] [place]     [name], M.D. [place], [place] [address]               Billie Lade, M.D., Ph.D. Phone: [phone]      Molli Hazard A. Kathrynn Running, M.D. Fax: [phone]      Radene Gunning, M.D., Ph.D.         Lurline Hare, M.D.         Grayland Jack, M.D Weekly Treatment Management Note  Name: Amanda Hoover     MRN: [id number]        CSN: [id number] Date: 04/18/2012      DOB: 06/18/1955  CC: [name], MD         [place]    Status: Outpatient  Diagnosis: The encounter diagnosis was Cancer of lower-inner quadrant of female breast.  Current Dose: 23.4 Gy  Current Fraction: 13  Planned Dose: 60.4 Gy  Narrative: Amanda Hoover was seen today for weekly treatment management. The chart was checked and port films  were reviewed. She is having some itching in the breast area. In light of this we have switched her to Biafine.  Macadamia nut oil; Other; Peanut-containing drug products; Tomato; Adhesive; Flour; Penicillins; Hydrocodone; Oxycodone; Vicodin; Eggs or egg-derived products; Soap; and Tramadol  Current Outpatient Prescriptions  Medication Sig Dispense Refill  . Calcium-Vitamin D (CALTRATE 600 PLUS-VIT D PO) Take by mouth.        . Cholecalciferol (VITAMIN D-3 PO) Take 5,000 Units by mouth.        . DiphenhydrAMINE HCl (BENADRYL PO) Take by mouth as needed.      Marland Kitchen emollient (BIAFINE) cream Apply 1 application topically 2 (two) times daily.      . Multiple Vitamin (MULTIVITAMIN) tablet Take 1 tablet by mouth daily.      . naproxen sodium (ANAPROX) 220 MG tablet Take 220 mg by mouth 2 (two) times daily with a meal.      . non-metallic deodorant (ALRA) MISC Apply 1 application topically daily as needed.      . pantoprazole (PROTONIX) 40 MG tablet Take 40 mg by mouth 2 (two) times daily.       . Wound Cleansers (RADIAPLEX EX) Apply topically.       Current Facility-Administered Medications  Medication  Dose Route Frequency Provider Last Rate Last Dose  . topical emolient (BIAFINE) emulsion   Topical PRN Billie Lade, MD       Labs:  Lab Results  Component Value Date   WBC 5.3 12/29/2011   HGB 13.3 02/15/2012   HCT 41.5 12/29/2011   MCV 94.5 12/29/2011   PLT 154 12/29/2011   Lab Results  Component Value Date   CREATININE 0.9 12/29/2011   BUN 11.0 12/29/2011   NA 139 12/29/2011   K 3.5 12/29/2011   CL 106 12/29/2011   CO2 23 12/29/2011   Lab Results  Component Value Date   ALT 46 12/29/2011   AST 41* 12/29/2011   BILITOT 0.70 12/29/2011    Physical Examination:  weight is 248 lb (112.492 kg). Her oral temperature is 98.7 F (37.1 C). Her blood pressure is 112/63 and her pulse is 71. Her respiration is 20.    Wt Readings from Last 3 Encounters:  04/18/12 248 lb (112.492 kg)  04/11/12 245 lb (111.131 kg)  04/04/12 242 lb 14.4 oz (110.179 kg)    The right breast area shows erythema particularly  in the upper inner aspect. There is no skin breakdown appreciated. There is mild hyperpigmentation changes throughout the breast. Lungs - Normal respiratory effort, chest expands symmetrically. Lungs are clear to auscultation, no crackles or wheezes.  Heart has regular rhythm and rate  Abdomen is soft and non tender with normal bowel sounds  Assessment:  Patient tolerating treatments well  Plan: Continue treatment per original radiation prescription

## 2012-04-18 NOTE — Progress Notes (Signed)
Patient here  Rad tx right breast 13 so far completed, skin dermatitis on breast near sternum,c/o increased itching, cortisone cream,prn, switched to biafine cream,. Erythema rest of skin area, skin intact, occasional soreness and pain inside breast at night 10:52 AM

## 2012-04-19 ENCOUNTER — Ambulatory Visit
Admission: RE | Admit: 2012-04-19 | Discharge: 2012-04-19 | Disposition: A | Discharge status: Home / Self Care | Payer: BC Managed Care – PPO | Source: Ambulatory Visit | Attending: Radiation Oncology | Admitting: Radiation Oncology

## 2012-04-20 ENCOUNTER — Ambulatory Visit
Admission: RE | Admit: 2012-04-20 | Discharge: 2012-04-20 | Disposition: A | Discharge status: Home / Self Care | Payer: BC Managed Care – PPO | Source: Ambulatory Visit | Attending: Radiation Oncology | Admitting: Radiation Oncology

## 2012-04-21 ENCOUNTER — Ambulatory Visit
Admission: RE | Admit: 2012-04-21 | Discharge: 2012-04-21 | Disposition: A | Discharge status: Home / Self Care | Payer: BC Managed Care – PPO | Source: Ambulatory Visit | Attending: Radiation Oncology | Admitting: Radiation Oncology

## 2012-04-24 ENCOUNTER — Ambulatory Visit
Admission: RE | Admit: 2012-04-24 | Discharge: 2012-04-24 | Disposition: A | Discharge status: Home / Self Care | Payer: BC Managed Care – PPO | Source: Ambulatory Visit | Attending: Radiation Oncology | Admitting: Radiation Oncology

## 2012-04-24 DIAGNOSIS — C50319 Malignant neoplasm of lower-inner quadrant of unspecified female breast: Secondary | ICD-10-CM

## 2012-04-24 MED ORDER — BIAFINE EX EMUL
Freq: Every day | CUTANEOUS | Status: DC
  Administered 2012-04-24: 18:00:00 via TOPICAL

## 2012-04-25 ENCOUNTER — Ambulatory Visit
Admission: RE | Admit: 2012-04-25 | Discharge: 2012-04-25 | Disposition: A | Discharge status: Home / Self Care | Payer: BC Managed Care – PPO | Source: Ambulatory Visit | Attending: Radiation Oncology | Admitting: Radiation Oncology

## 2012-04-25 ENCOUNTER — Encounter: Payer: Self-pay | Admitting: Radiation Oncology

## 2012-04-25 VITALS — BP 128/53 | HR 75 | Resp 18 | Wt 249.3 lb

## 2012-04-25 DIAGNOSIS — C50319 Malignant neoplasm of lower-inner quadrant of unspecified female breast: Secondary | ICD-10-CM

## 2012-04-25 NOTE — Progress Notes (Signed)
Patient presents to the clinic today unaccompanied for PUT with Dr. Roselind Messier. Patient alert and oriented to person, place, and time. No distress noted. Steady gait noted. Pleasant affect noted. Patient denies pain at this time. However, patient reports occasional sharp shooting pain in her right breast but, understands this is part of healing. Patient denies nipple discharge. Hyperpigmentation with dermatitis noted to right/treated breast. Patient reports using biafine and cortisone cream as directed. Patient reports that she plans to see her GYN soon because of greenish vaginal discharge. Patient denies burning with urination or diarrhea. Reported all findings to Dr. Roselind Messier.

## 2012-04-25 NOTE — Progress Notes (Signed)
[facility]    Radiation Oncology [address] [place]     [name], M.D. [place], [place] [address]               Billie Lade, M.D., Ph.D. Phone: [phone]      Molli Hazard A. Kathrynn Running, M.D. Fax: [phone]      Radene Gunning, M.D., Ph.D.         Lurline Hare, M.D.         Grayland Jack, M.D Weekly Treatment Management Note  Name: Amanda Hoover     MRN: [id number]        CSN: [id number] Date: 04/25/2012      DOB: 04-Nov-1955  CC: Amanda Grosser, MD         [place]    Status: Outpatient  Diagnosis: The encounter diagnosis was Cancer of lower-inner quadrant of female breast.  Current Dose: 32.4 Gy  Current Fraction: 18  Planned Dose: 60.4 Gy  Narrative: Amanda Hoover was seen today for weekly treatment management. The chart was checked and port films  were reviewed. She is having some itching particularly in the upper inner aspect of the right breast. She is using Biafine and hydrocortisone cream at this time.  Macadamia nut oil; Other; Peanut-containing drug products; Tomato; Adhesive; Flour; Penicillins; Hydrocodone; Oxycodone; Vicodin; Eggs or egg-derived products; Soap; and Tramadol  Current Outpatient Prescriptions  Medication Sig Dispense Refill  . Calcium-Vitamin D (CALTRATE 600 PLUS-VIT D PO) Take by mouth.        . Cholecalciferol (VITAMIN D-3 PO) Take 5,000 Units by mouth.        . DiphenhydrAMINE HCl (BENADRYL PO) Take by mouth as needed.      Marland Kitchen emollient (BIAFINE) cream Apply 1 application topically 2 (two) times daily.      . Multiple Vitamin (MULTIVITAMIN) tablet Take 1 tablet by mouth daily.      . naproxen sodium (ANAPROX) 220 MG tablet Take 220 mg by mouth 2 (two) times daily with a meal.      . non-metallic deodorant (ALRA) MISC Apply 1 application topically daily as needed.      . pantoprazole (PROTONIX) 40 MG tablet Take 40 mg by mouth 2 (two) times daily.       . Wound Cleansers (RADIAPLEX EX) Apply topically.       No  current facility-administered medications for this encounter.   Labs:  Physical Examination:  weight is 249 lb 4.8 oz (113.082 kg). Her blood pressure is 128/53 and her pulse is 75. Her respiration is 18.    Wt Readings from Last 3 Encounters:  04/25/12 249 lb 4.8 oz (113.082 kg)  04/18/12 248 lb (112.492 kg)  04/11/12 245 lb (111.131 kg)    Radiation dermatitis in the upper inner aspect of the right breast. There is erythema throughout the breast and some hyperpigmentation changes. There is no moist desquamation. Lungs - Normal respiratory effort, chest expands symmetrically. Lungs are clear to auscultation, no crackles or wheezes.  Heart has regular rhythm and rate  Abdomen is soft and non tender with normal bowel sounds  Assessment:  Patient tolerating treatments well  Plan: Continue treatment per original radiation prescription

## 2012-04-26 ENCOUNTER — Ambulatory Visit
Admission: RE | Admit: 2012-04-26 | Discharge: 2012-04-26 | Disposition: A | Discharge status: Home / Self Care | Payer: BC Managed Care – PPO | Source: Ambulatory Visit | Attending: Radiation Oncology | Admitting: Radiation Oncology

## 2012-04-27 ENCOUNTER — Ambulatory Visit: Payer: BC Managed Care – PPO

## 2012-04-28 ENCOUNTER — Ambulatory Visit
Admission: RE | Admit: 2012-04-28 | Discharge: 2012-04-28 | Disposition: A | Discharge status: Home / Self Care | Payer: BC Managed Care – PPO | Source: Ambulatory Visit | Attending: Radiation Oncology | Admitting: Radiation Oncology

## 2012-05-01 ENCOUNTER — Ambulatory Visit
Admission: RE | Admit: 2012-05-01 | Discharge: 2012-05-01 | Disposition: A | Discharge status: Home / Self Care | Payer: BC Managed Care – PPO | Source: Ambulatory Visit | Attending: Radiation Oncology | Admitting: Radiation Oncology

## 2012-05-02 ENCOUNTER — Ambulatory Visit
Admission: RE | Admit: 2012-05-02 | Discharge: 2012-05-02 | Disposition: A | Discharge status: Home / Self Care | Payer: BC Managed Care – PPO | Source: Ambulatory Visit | Attending: Radiation Oncology | Admitting: Radiation Oncology

## 2012-05-02 ENCOUNTER — Encounter: Payer: Self-pay | Admitting: Radiation Oncology

## 2012-05-02 VITALS — BP 133/81 | HR 97 | Resp 18

## 2012-05-02 DIAGNOSIS — C50319 Malignant neoplasm of lower-inner quadrant of unspecified female breast: Secondary | ICD-10-CM

## 2012-05-02 NOTE — Progress Notes (Signed)
[facility]    Radiation Oncology [address] [place]     [name], M.D. [place], [place] [address]               Billie Lade, M.D., Ph.D. Phone: [phone]      Molli Hazard A. Kathrynn Running, M.D. Fax: [phone]      Radene Gunning, M.D., Ph.D.         Lurline Hare, M.D.         Grayland Jack, M.D Weekly Treatment Management Note  Name: Amanda Hoover     MRN: [id number]        CSN: [id number] Date: 05/02/2012      DOB: [birth year]  CC: Amanda Grosser, MD         [place]    Status: Outpatient  Diagnosis: The encounter diagnosis was Cancer of lower-inner quadrant of female breast.  Current Dose: 39.6 Gy  Current Fraction: 22  Planned Dose: 60.4 Gy  Narrative: Amanda Hoover was seen today for weekly treatment management. The chart was checked and port films  were reviewed. She is having some fatigue as well as some itching and soreness within the breast area. She has started using a Neosporin ointment for her skin breakdown in the axillary region.  Macadamia nut oil; Other; Peanut-containing drug products; Tomato; Adhesive; Flour; Penicillins; Hydrocodone; Oxycodone; Vicodin; Eggs or egg-derived products; Soap; and Tramadol  Current Outpatient Prescriptions  Medication Sig Dispense Refill  . Calcium-Vitamin D (CALTRATE 600 PLUS-VIT D PO) Take by mouth.        . Cholecalciferol (VITAMIN D-3 PO) Take 5,000 Units by mouth.        . DiphenhydrAMINE HCl (BENADRYL PO) Take by mouth as needed.      Marland Kitchen emollient (BIAFINE) cream Apply 1 application topically 2 (two) times daily.      . Multiple Vitamin (MULTIVITAMIN) tablet Take 1 tablet by mouth daily.      . naproxen sodium (ANAPROX) 220 MG tablet Take 220 mg by mouth 2 (two) times daily with a meal.      . non-metallic deodorant (ALRA) MISC Apply 1 application topically daily as needed.      . pantoprazole (PROTONIX) 40 MG tablet Take 40 mg by mouth 2 (two) times daily.       . Wound Cleansers (RADIAPLEX  EX) Apply topically.       No current facility-administered medications for this encounter.   Physical Examination:  blood pressure is 133/81 and her pulse is 97. Her respiration is 18.    Wt Readings from Last 3 Encounters:  04/25/12 249 lb 4.8 oz (113.082 kg)  04/18/12 248 lb (112.492 kg)  04/11/12 245 lb (111.131 kg)    This has some very mild skin breakdown in the low axillary area. The remainder of the breast shows diffuse erythema. Lungs - Normal respiratory effort, chest expands symmetrically.  Heart has regular rhythm and rate  Abdomen is soft and non tender with normal bowel sounds  Assessment:  Patient tolerating treatments well  Plan: Continue treatment per original radiation prescription

## 2012-05-02 NOTE — Progress Notes (Signed)
Patient presents to the clinic today unaccompanied for PUT with Dr. Roselind Messier. Patient alert and oriented to person, place, and time. No distress noted. Steady gait noted. Pleasant affect noted. Patient denies pain at this time. Hyperpigmentation with dry desquamation noted under right axilla. Patient reports using biafine, radiaplex and cortisone cream as directed. Dermatitis of right chest wall noted. Provided patient with additional tube of radiaplex, triple action antibiotic for desquamation and pillow to prevent friction under the right arm. Patient reports mild fatigue. Patient reports a sore throat and head cold x1 week. Reported all findings to Dr. Roselind Messier.

## 2012-05-03 ENCOUNTER — Ambulatory Visit
Admission: RE | Admit: 2012-05-03 | Discharge: 2012-05-03 | Disposition: A | Discharge status: Home / Self Care | Payer: BC Managed Care – PPO | Source: Ambulatory Visit | Attending: Radiation Oncology | Admitting: Radiation Oncology

## 2012-05-03 DIAGNOSIS — C50319 Malignant neoplasm of lower-inner quadrant of unspecified female breast: Secondary | ICD-10-CM

## 2012-05-03 MED ORDER — RADIAPLEXRX EX GEL
Freq: Once | CUTANEOUS | Status: AC
  Administered 2012-05-03: 10:00:00 via TOPICAL

## 2012-05-03 NOTE — Progress Notes (Signed)
Provided patient with another tube of radiaplex gel, arm pillow, and triple action antibiotic for area of desquamation at axilla. Directed upon use of all. Spoke with Amanda Hoover, RT on linac 3 about 05/10/2012 appointment with Magrinat and treatment appointment being schedule on top of one another. Encouraged her to reschedule treatment appointment for earlier or later that day.

## 2012-05-04 ENCOUNTER — Ambulatory Visit
Admission: RE | Admit: 2012-05-04 | Discharge: 2012-05-04 | Disposition: A | Discharge status: Home / Self Care | Payer: BC Managed Care – PPO | Source: Ambulatory Visit | Attending: Radiation Oncology | Admitting: Radiation Oncology

## 2012-05-05 ENCOUNTER — Ambulatory Visit
Admission: RE | Admit: 2012-05-05 | Discharge: 2012-05-05 | Disposition: A | Discharge status: Home / Self Care | Payer: BC Managed Care – PPO | Source: Ambulatory Visit | Attending: Radiation Oncology | Admitting: Radiation Oncology

## 2012-05-08 ENCOUNTER — Ambulatory Visit
Admission: RE | Admit: 2012-05-08 | Discharge: 2012-05-08 | Disposition: A | Discharge status: Home / Self Care | Payer: BC Managed Care – PPO | Source: Ambulatory Visit | Attending: Radiation Oncology | Admitting: Radiation Oncology

## 2012-05-09 ENCOUNTER — Ambulatory Visit
Admission: RE | Admit: 2012-05-09 | Discharge: 2012-05-09 | Disposition: A | Discharge status: Home / Self Care | Payer: BC Managed Care – PPO | Source: Ambulatory Visit | Attending: Radiation Oncology | Admitting: Radiation Oncology

## 2012-05-09 ENCOUNTER — Encounter: Payer: Self-pay | Admitting: Radiation Oncology

## 2012-05-09 VITALS — BP 125/74 | HR 80 | Temp 98.4°F | Resp 20 | Wt 250.3 lb

## 2012-05-09 DIAGNOSIS — C50311 Malignant neoplasm of lower-inner quadrant of right female breast: Secondary | ICD-10-CM

## 2012-05-09 NOTE — Addendum Note (Signed)
Encounter addended by: Lowella Petties, RN on: 05/09/2012  1:28 PM<BR>     Documentation filed: Inpatient Document Flowsheet

## 2012-05-09 NOTE — Progress Notes (Addendum)
Patient here for right breast  Put 27/33 completed  Bright erythema dermatitis on front of chest , under inframmary fold peeling, using neosporin there and biafine  & radiaplex gel on rest of brerast area 10:37 AM

## 2012-05-09 NOTE — Progress Notes (Signed)
[facility]    Radiation Oncology [address] [place]     [name], M.D. [place], [place] [address]               Billie Lade, M.D., Ph.D. Phone: [phone]      Molli Hazard A. Kathrynn Running, M.D. Fax: [phone]      Radene Gunning, M.D., Ph.D.         Lurline Hare, M.D.         Grayland Jack, M.D Weekly Treatment Management Note  Name: Amanda Hoover     MRN: [id number]        CSN: [id number] Date: 05/09/2012      DOB: [birth year]  CC: [name], MD         [place]    Status: Outpatient  Diagnosis: The encounter diagnosis was Cancer of lower-inner quadrant of female breast, right.  Current Dose: 48.6 Gy  Current Fraction: 27  Planned Dose: 60.4 Gy  Narrative: Amanda Hoover was seen today for weekly treatment management. The chart was checked and port films  were reviewed. She is having some soreness within the right breast area as well fatigue.  Macadamia nut oil; Other; Peanut-containing drug products; Tomato; Adhesive; Flour; Penicillins; Hydrocodone; Oxycodone; Vicodin; Eggs or egg-derived products; Soap; and Tramadol Current Outpatient Prescriptions  Medication Sig Dispense Refill  . Calcium-Vitamin D (CALTRATE 600 PLUS-VIT D PO) Take by mouth.        . Cholecalciferol (VITAMIN D-3 PO) Take 5,000 Units by mouth.        . DiphenhydrAMINE HCl (BENADRYL PO) Take by mouth as needed.      Marland Kitchen emollient (BIAFINE) cream Apply 1 application topically 2 (two) times daily.      . Multiple Vitamin (MULTIVITAMIN) tablet Take 1 tablet by mouth daily.      . naproxen sodium (ANAPROX) 220 MG tablet Take 220 mg by mouth 2 (two) times daily with a meal.      . non-metallic deodorant (ALRA) MISC Apply 1 application topically daily as needed.      . pantoprazole (PROTONIX) 40 MG tablet Take 40 mg by mouth 2 (two) times daily.       . Wound Cleansers (RADIAPLEX EX) Apply topically.       No current facility-administered medications for this encounter.    Labs:   Physical Examination:  weight is 250 lb 4.8 oz (113.535 kg). Her oral temperature is 98.4 F (36.9 C). Her blood pressure is 125/74 and her pulse is 80. Her respiration is 20.    Wt Readings from Last 3 Encounters:  05/09/12 250 lb 4.8 oz (113.535 kg)  04/25/12 249 lb 4.8 oz (113.082 kg)  04/18/12 248 lb (112.492 kg)    Brisk erythema throughout the right breast with healed moist desquamation in the axillary and inframammary fold areas. Lungs - Normal respiratory effort, chest expands symmetrically. Lungs are clear to auscultation, no crackles or wheezes.  Heart has regular rhythm and rate  Abdomen is soft and non tender with normal bowel sounds  Assessment:  Patient tolerating treatments well except for issues as above  Plan: Continue treatment per original radiation prescription

## 2012-05-10 ENCOUNTER — Telehealth: Payer: Self-pay | Admitting: Oncology

## 2012-05-10 ENCOUNTER — Ambulatory Visit
Admission: RE | Admit: 2012-05-10 | Discharge: 2012-05-10 | Disposition: A | Discharge status: Home / Self Care | Payer: BC Managed Care – PPO | Source: Ambulatory Visit | Attending: Radiation Oncology | Admitting: Radiation Oncology

## 2012-05-10 ENCOUNTER — Ambulatory Visit: Payer: BC Managed Care – PPO | Admitting: Oncology

## 2012-05-10 ENCOUNTER — Ambulatory Visit (HOSPITAL_BASED_OUTPATIENT_CLINIC_OR_DEPARTMENT_OTHER): Payer: BC Managed Care – PPO | Admitting: Oncology

## 2012-05-10 VITALS — BP 133/70 | HR 71 | Temp 97.9°F | Resp 20 | Ht 64.5 in | Wt 249.3 lb

## 2012-05-10 DIAGNOSIS — Z803 Family history of malignant neoplasm of breast: Secondary | ICD-10-CM

## 2012-05-10 DIAGNOSIS — Z171 Estrogen receptor negative status [ER-]: Secondary | ICD-10-CM

## 2012-05-10 DIAGNOSIS — D0590 Unspecified type of carcinoma in situ of unspecified breast: Secondary | ICD-10-CM

## 2012-05-10 DIAGNOSIS — C50311 Malignant neoplasm of lower-inner quadrant of right female breast: Secondary | ICD-10-CM

## 2012-05-10 MED ORDER — TAMOXIFEN CITRATE 20 MG PO TABS: 20.0000 mg | ORAL_TABLET | Freq: Every day | ORAL | Status: DC

## 2012-05-10 NOTE — Assessment & Plan Note (Signed)
Right sided.  

## 2012-05-10 NOTE — Progress Notes (Signed)
ID: Amanda Hoover   DOB: November 03, 1955  MR#: [id number]  WUJ#:[id number]  PCP: [name], MD GYN: Melbourne Abts MD SU: Claud Kelp MD OTHER MD: Antony Blackbird, Claudette Head   HISTORY OF PRESENT ILLNESS: Amanda Hoover had routine bilateral screening mammography 12/02/2011 showing heterogeneously dense breasts. New calcifications were noted in the right breast, and were further evaluated with right diagnostic mammography 12/10/2011. This confirmed a group of pleomorphic microcalcifications in the lower inner quadrant of the right breast. 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 On 12/24/2011 the patient underwent bilateral breast MRI. This showed post biopsy changes in the right lower inner quadrant, associated with an area of clumped nodular enhancement measuring up to 3.7 cm.  The patient's subsequent history is as detailed below.  INTERVAL HISTORY: Amanda Hoover for followup of her breast cancer. Since her last visit here she started her radiation treatments, and she is scheduled to finish next week.  REVIEW OF SYSTEMS: She is tolerating the radiation well, with mild fatigue, and some dry desquamation. Unfortunately she had to take some leave because it would give her no leeway in her job (they told her either she did 100% of the job where she couldn't have it) so she is not employed. She is concerned about this. She has some sinus problems, but aside from the rash in the fatigue a detailed review of systems Hoover was entirely stable  PAST MEDICAL HISTORY: Past Medical History  Diagnosis Date  . GERD (gastroesophageal reflux disease)   . Hiatal hernia   . Osteopenia   . History of migraine   . Headache     stress, sinus headaches  . Multiple environmental allergies   . History of colon polyps   .  History of esophageal stricture     has had esophageal dilation x 1  . Palpitations     occasional; worse with ingestion of caffeine; no cardiologist  . Depression     no current meds.  . Dental crowns present   . Rash 01/06/2012    right elbow  . Rhinitis, allergic     rhinitis  . Hiatal hernia   . Chronic headaches   . Osteopenia   . 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  . Breast cancer, right     ER 0 PR0 LN neg.    PAST SURGICAL HISTORY: Past Surgical History  Procedure Laterality Date  . Colonoscopy w/ polypectomy    . Partial mastectomy with needle localization and axillary sentinel lymph node bx  01/12/2012    Procedure: PARTIAL MASTECTOMY WITH NEEDLE LOCALIZATION AND AXILLARY SENTINEL LYMPH NODE BX;  Surgeon: Ernestene Mention, MD;  Location: [facility];  Service: General;  Laterality: Right;  . Esophagogastroduodenoscopy    . Re-excision of breast cancer,superior margins  02/15/2012    Procedure: RE-EXCISION OF BREAST CANCER,SUPERIOR MARGINS;  Surgeon: Ernestene Mention, MD;  Location: [facility];  Service: General;  Laterality: Right;  Re-excision of right lumpectomy, close posterior margin.    FAMILY HISTORY Family History  Problem Relation Age of Onset  . Breast cancer    . Liver disease    . Crohn's disease Mother   . Irritable bowel syndrome Mother   . Esophageal cancer Father   . Cancer Father     esophageal  . Breast  cancer Paternal Aunt   . Cancer Paternal Aunt     breast cancer  . Diabetes Maternal Grandmother   . Breast cancer Paternal Grandmother   . Diabetes Paternal Grandmother   . Cancer Paternal Grandmother     breast    the patient's father died of cancer of the esophagus at age 48. He had been diagnosed with this shortly before. [name] is currently 25 years old (as of October 2013). [name] was diagnosed with breast cancer at the age of  70. [name] was also diagnosed with breast cancer, at the age of 80. [name] (her paternal grandmothers only sister) was diagnosed with breast cancer in her 92s. The patient had one brother who died from a viral illness at the age of 66. There is no history of ovarian cancer in the family.  GYNECOLOGIC HISTORY: Menarche age 32, last menstrual period at age 40. The patient took hormone replacement for approximately one year. She is GX P4, first live birth age 4  SOCIAL HISTORY: (updated October 2013) Amanda Hoover worked as a Teacher, adult education. She lost her job as a results of her cancer treatments. Her husband Amanda Hoover works in the [employer]. Son Amanda Hoover is an Personnel officer and [name]. Son Amanda Hoover lives in [place] where he works as a Land. Daughter Amanda Hoover works as a Naval architect in [employer]. The patient has 4 grandchildren. She attends a [employer].   ADVANCED DIRECTIVES: in place  HEALTH MAINTENANCE: History  Substance Use Topics  . Smoking status: Former Smoker -- 0.50 packs/day for 5 years    Types: Cigarettes    Quit date: 03/15/2000  . Smokeless tobacco: Never Used  . Alcohol Use: No     Colonoscopy: 2013  PAP: 2012  Bone density: 08/17/2010/ T - 2.1  Lipid panel:  Allergies  Allergen Reactions  . Macadamia Nut Oil Anaphylaxis  . Other Swelling    POPCORN:  SWELLING THROAT  . Peanut-Containing Drug Products Swelling and Other (See Comments)    THROAT TIGHTNESS; ALSO WALNUTS  . Tomato Swelling    SWELLING UVULA  . Adhesive (Tape) Other (See Comments)    BLISTERS  . Flour Other (See Comments)    SNEEZING  . Penicillins Hives  . Hydrocodone Hives  . Oxycodone   . Vicodin (Hydrocodone-Acetaminophen) Swelling    Pt had flushing, then itching and swelling of lips  . Eggs Or Egg-Derived Products Other (See Comments)    POSITIVE ON ALLERGY TEST, BUT EATS EGGS WITHOUT PROBLEM  . Soap  Other (See Comments)    FRAGRANCES (SOAP/LOTION/PERFUME):  HEADACHE, RUNNY NOSE  . Tramadol Nausea And Vomiting    Current Outpatient Prescriptions  Medication Sig Dispense Refill  . Calcium-Vitamin D (CALTRATE 600 PLUS-VIT D PO) Take by mouth.        . Cholecalciferol (VITAMIN D-3 PO) Take 5,000 Units by mouth.        . DiphenhydrAMINE HCl (BENADRYL PO) Take by mouth as needed.      Marland Kitchen emollient (BIAFINE) cream Apply 1 application topically 2 (two) times daily.      . Multiple Vitamin (MULTIVITAMIN) tablet Take 1 tablet by mouth daily.      . naproxen sodium (ANAPROX) 220 MG tablet Take 220 mg by mouth 2 (two) times daily with a meal.      . non-metallic deodorant (ALRA) MISC Apply 1 application topically daily as needed.      Marland Kitchen  pantoprazole (PROTONIX) 40 MG tablet Take 40 mg by mouth 2 (two) times daily.       . Wound Cleansers (RADIAPLEX EX) Apply topically.       No current facility-administered medications for this visit.    OBJECTIVE: Middle-aged white woman in no acute distress Filed Vitals:   05/10/12 1005  BP: 133/70  Pulse: 71  Temp: 97.9 F (36.6 C)  Resp: 20     Body mass index is 42.15 kg/(m^2).    ECOG FS: 0 Sclerae unicteric Oropharynx clear No cervical or supraclavicular adenopathy Lungs no rales or rhonchi Heart regular rate and rhythm Abd benign MSK no focal spinal tenderness, no peripheral edema Neuro: nonfocal Breasts: The right breast is status post lumpectomy and is receiving radiation. There is erythema over the port, and dry desquamation in the inframammary fold. There is no evidence of disease recurrence. The right axilla is benign. The left breast is unremarkable.   LAB RESULTS: Lab Results  Component Value Date   WBC 5.3 12/29/2011   NEUTROABS 2.8 12/29/2011   HGB 13.3 02/15/2012   HCT 41.5 12/29/2011   MCV 94.5 12/29/2011   PLT 154 12/29/2011      Chemistry      Component Value Date/Time   NA 139 12/29/2011 1304   NA 139 02/21/2009 0605    NA 138 08/30/2008   K 3.5 12/29/2011 1304   K 3.7 02/21/2009 0605   CL 106 12/29/2011 1304   CL 109 02/21/2009 0605   CO2 23 12/29/2011 1304   CO2 24 02/21/2009 0605   BUN 11.0 12/29/2011 1304   BUN 8 02/21/2009 0605   BUN 12 08/30/2008   CREATININE 0.9 12/29/2011 1304   CREATININE 0.93 02/21/2009 0605   CREATININE 1.0 08/30/2008   GLU 99 08/30/2008      Component Value Date/Time   CALCIUM 9.9 12/29/2011 1304   CALCIUM 9.0 02/21/2009 0605   ALKPHOS 125 12/29/2011 1304   ALKPHOS 106 02/20/2009 1210   AST 41* 12/29/2011 1304   AST 39* 02/20/2009 1210   ALT 46 12/29/2011 1304   ALT 52* 02/20/2009 1210   BILITOT 0.70 12/29/2011 1304   BILITOT 0.8 02/20/2009 1210       No results found for this basename: LABCA2    No components found with this basename: LABCA125    No results found for this basename: INR,  in the last 168 hours  Urinalysis    Component Value Date/Time   COLORURINE YELLOW 02/20/2009 1131   APPEARANCEUR CLEAR 02/20/2009 1131   LABSPEC 1.010 02/20/2009 1131   PHURINE 6.5 02/20/2009 1131   GLUCOSEU NEGATIVE 02/20/2009 1131   HGBUR NEGATIVE 02/20/2009 1131   BILIRUBINUR NEGATIVE 02/20/2009 1131   KETONESUR NEGATIVE 02/20/2009 1131   PROTEINUR NEGATIVE 02/20/2009 1131   UROBILINOGEN 0.2 02/20/2009 1131   NITRITE NEGATIVE 02/20/2009 1131   LEUKOCYTESUR NEGATIVE MICROSCOPIC NOT DONE ON URINES WITH NEGATIVE PROTEIN, BLOOD, LEUKOCYTES, NITRITE, OR GLUCOSE <1000 mg/dL. 02/20/2009 1131    STUDIES: No results found.  ASSESSMENT: 57 y.o. 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  Antiestrogen is would not be a treatment for cancers, but they would be preventative in terms of developing a new breast cancer and the risk of that developing is around 1% per year, dropping to a half percent per year if she does take anti-estrogens.  Between anastrozole and tamoxifen she would prefer tamoxifen. However she has not decided to take anything. She is very worried about hot flashes, depression, and uterine cancer.  We left it that I would write a prescription, which I did, and if she decides to take it as she will start. If she gets significant hot flashes other problems she will stop, no taper as needed. In any case she will see [place] again in November after her next mammography. We would see her on a once a year basis thereafter until she completes 5 years of followup. If she does start the tamoxifen she will call us if she has any complications side effects or toxicities. MAGRINAT,GUSTAV C    05/10/2012

## 2012-05-10 NOTE — Telephone Encounter (Signed)
gv pt appt schedule for November.

## 2012-05-11 ENCOUNTER — Ambulatory Visit
Admission: RE | Admit: 2012-05-11 | Discharge: 2012-05-11 | Disposition: A | Discharge status: Home / Self Care | Payer: BC Managed Care – PPO | Source: Ambulatory Visit | Attending: Radiation Oncology | Admitting: Radiation Oncology

## 2012-05-11 ENCOUNTER — Encounter: Payer: Self-pay | Admitting: Radiation Oncology

## 2012-05-12 ENCOUNTER — Ambulatory Visit
Admission: RE | Admit: 2012-05-12 | Discharge: 2012-05-12 | Disposition: A | Discharge status: Home / Self Care | Payer: BC Managed Care – PPO | Source: Ambulatory Visit | Attending: Radiation Oncology | Admitting: Radiation Oncology

## 2012-05-15 ENCOUNTER — Ambulatory Visit
Admission: RE | Admit: 2012-05-15 | Discharge: 2012-05-15 | Disposition: A | Discharge status: Home / Self Care | Payer: BC Managed Care – PPO | Source: Ambulatory Visit | Attending: Radiation Oncology | Admitting: Radiation Oncology

## 2012-05-16 ENCOUNTER — Ambulatory Visit
Admission: RE | Admit: 2012-05-16 | Discharge: 2012-05-16 | Disposition: A | Discharge status: Home / Self Care | Payer: BC Managed Care – PPO | Source: Ambulatory Visit | Attending: Radiation Oncology | Admitting: Radiation Oncology

## 2012-05-16 ENCOUNTER — Encounter: Payer: Self-pay | Admitting: Radiation Oncology

## 2012-05-16 ENCOUNTER — Ambulatory Visit: Payer: BC Managed Care – PPO

## 2012-05-16 VITALS — BP 102/69 | HR 69 | Resp 18 | Wt 250.0 lb

## 2012-05-16 DIAGNOSIS — C50311 Malignant neoplasm of lower-inner quadrant of right female breast: Secondary | ICD-10-CM

## 2012-05-16 NOTE — Progress Notes (Signed)
  Radiation Oncology         [phone] ________________________________  Name: Amanda Hoover MRN: [id number]  Date: 05/16/2012  DOB: 07-27-1955  Weekly Radiation Therapy Management  Current Dose: 58.4 Gy     Planned Dose:  60.4 Gy  Narrative . . . . . . . . The patient presents for routine under treatment assessment.                                                     The patient is without complaint except for occasional sharp shooting pains within the breast area.                                 Set-up films were reviewed.                                 The chart was checked. Physical Findings. . .  weight is 250 lb (113.399 kg). Her blood pressure is 102/69 and her pulse is 69. Her respiration is 18. . Weight essentially stable.  Brisk erythema and hyper-pigmentation changes throughout the right breast. There is no moist desquamation. Impression . . . . . . . The patient is  tolerating radiation. Plan . . . . . . . . . . . . Continue treatment as planned.  ________________________________  -----------------------------------  Billie Lade, PhD, MD

## 2012-05-16 NOTE — Progress Notes (Signed)
Patient presents to the clinic today unaccompanied for PUT with Dr. Roselind Messier. Patient is alert and oriented to person, place, and time. No distress noted. Steady gait noted. Pleasant affect noted. Patient denies pain at this time. Hyperpigmentation of right/treated breast noted. Dermatitis of right chest wall noted. Patient reports using biafine as directed. Encouraged patient to continue using biafine bid for next two weeks. Educated patient on [facility] and [facility]. Provided patient with appointment card to return in one month for follow up. Encouraged patient to call with future needs. Patient verbalized understanding of all reviewed. Patient was let go from her job at [employer] after 19 years. Reported all findings to Dr. Roselind Messier.

## 2012-05-17 ENCOUNTER — Ambulatory Visit
Admission: RE | Admit: 2012-05-17 | Discharge: 2012-05-17 | Disposition: A | Discharge status: Home / Self Care | Payer: BC Managed Care – PPO | Source: Ambulatory Visit | Attending: Radiation Oncology | Admitting: Radiation Oncology

## 2012-05-17 NOTE — Progress Notes (Signed)
Patient completed radiation treatment today. Patient seen yesterday by Dr. Roselind Messier. Received patient in the clinic following treatment. Patient has no complaints or questions. Escorted patient to [facility] to schedule one month follow up appointment with Dr. Roselind Messier. Encouraged patient to contact staff with future needs and she verbalized understanding.

## 2012-05-18 NOTE — Progress Notes (Signed)
  Radiation Oncology         [phone] ________________________________  Name: Amanda Hoover MRN: [id number]  Date: 05/11/2012  DOB: 01/28/1956  Simulation Verification Note  Status: outpatient  NARRATIVE: The patient was brought to the treatment unit and placed in the planned treatment position. The clinical setup was verified. Then port films were obtained and uploaded to the radiation oncology medical record software.  The treatment beams were carefully compared against the planned radiation fields. The position location and shape of the radiation fields was reviewed. The targeted volume of tissue appears appropriately covered by the radiation beams. Organs at risk appear to be excluded as planned.  Based on my personal review, I approved the simulation verification. The patient's treatment will proceed as planned.  ------------------------------------------------  Lurline Hare, MD

## 2012-05-27 ENCOUNTER — Encounter: Payer: Self-pay | Admitting: Radiation Oncology

## 2012-05-27 NOTE — Progress Notes (Signed)
  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   She did experience some discomfort and soreness within the breast area.  she also developed some mild moist desquamation in the inframammary fold area which responded well to a triple antibiotic ointment.  Plan: The patient has completed radiation treatment. The patient will return to radiation oncology clinic for routine followup in one month. I advised them to call or return sooner if they have any questions or concerns related to their recovery or treatment.  -----------------------------------  Billie Lade, PhD, MD

## 2012-05-27 NOTE — Progress Notes (Signed)
   Department of Radiation Oncology  Phone:  [phone] Fax:        [phone]  Simulation note  On 05/10/2012 the patient underwent additional planning for radiation therapy directed at the right breast area. The patient's treatment planning CT scan was reviewed and she had set up of a boost field directed at the site of presentation in the lower inner quadrant of the right breast. The patient had set up of a 3 field photon technique.  A computerized isodose plan will be generated for treatment.  -----------------------------------  Billie Lade, PhD, MD

## 2012-06-15 ENCOUNTER — Encounter: Payer: Self-pay | Admitting: Radiation Oncology

## 2012-06-15 ENCOUNTER — Ambulatory Visit
Admission: RE | Admit: 2012-06-15 | Discharge: 2012-06-15 | Disposition: A | Discharge status: Home / Self Care | Payer: BC Managed Care – PPO | Source: Ambulatory Visit | Attending: Radiation Oncology | Admitting: Radiation Oncology

## 2012-06-15 VITALS — Resp 16 | Wt 248.9 lb

## 2012-06-15 DIAGNOSIS — C50311 Malignant neoplasm of lower-inner quadrant of right female breast: Secondary | ICD-10-CM

## 2012-06-15 NOTE — Progress Notes (Signed)
  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  She is doing reasonably well at this point. She continues to have some mild fatigue. She did have some problems with itching in the upper inner aspect the breast after her treatment but this is improving in addition. She is still deciding whether she will proceed with tamoxifen.                              ALLERGIES:  is allergic to macadamia nut oil; other; peanut-containing drug products; tomato; adhesive; flour; penicillins; hydrocodone; oxycodone; vicodin; eggs or egg-derived products; soap; and tramadol.  Meds: Current Outpatient Prescriptions  Medication Sig Dispense Refill  . Calcium-Vitamin D (CALTRATE 600 PLUS-VIT D PO) Take by mouth.        . Cholecalciferol (VITAMIN D-3 PO) Take 5,000 Units by mouth.        . DiphenhydrAMINE HCl (BENADRYL PO) Take by mouth as needed.      . Multiple Vitamin (MULTIVITAMIN) tablet Take 1 tablet by mouth daily.      . naproxen sodium (ANAPROX) 220 MG tablet Take 220 mg by mouth 2 (two) times daily with a meal.      . pantoprazole (PROTONIX) 40 MG tablet Take 40 mg by mouth 2 (two) times daily.       Marland Kitchen emollient (BIAFINE) cream Apply 1 application topically 2 (two) times daily.      . non-metallic deodorant Thornton Papas) MISC Apply 1 application topically daily as needed.      . tamoxifen (NOLVADEX) 20 MG tablet Take 1 tablet (20 mg total) by mouth daily.  90 tablet  12  . Wound Cleansers (RADIAPLEX EX) Apply topically.       No current facility-administered medications for this encounter.    Physical Findings: The patient is in no acute distress. Patient is  alert and oriented.  weight is 248 lb 14.4 oz (112.9 kg). Her respiration is 16. Marland Kitchen  No palpable cervical supraclavicular or axillary adenopathy. The lungs are clear to auscultation. The heart has a regular rhythm and rate. Examination of the left breast reveals no mass or nipple discharge. Examination of the right breast reveals some hyperpigmentation changes. The patient's skin is well healed at this time.  there is some edema in the nipple areolar complex region. There is no dominant mass appreciated in the breast.  Impression:  The patient is recovering from the effects of radiation.  No evidence of recurrence on clinical exam today  Plan:  Routine followup in 6 months  _____________________________________  -----------------------------------  Billie Lade, PhD, MD

## 2012-06-15 NOTE — Progress Notes (Addendum)
Patient presents to the clinic today unaccompanied for follow up appointment with Dr. Roselind Messier. Patient is alert and oriented to person, place, and time. No distress noted. Steady gait noted. Pleasant affect noted. Patient denies breast pain at this time. Five small healed scabs noted right chest wall. Patient reports scratching this area of dermatitis. Encouraged patient to apply biafine cream or hydrocortisone cream to this area to relieve itching instead of scratching. Patient verbalized understanding. Only faint hyperpigmentation of right breast noted. Patient denies nipple discharge. Patient denies edema or decreased ROM of right arm. Patient reports only mild fatigue. She reports that she has been more active with her granddaughter who is 2. Reported all findings to Dr. Roselind Messier.

## 2012-06-16 ENCOUNTER — Telehealth: Payer: Self-pay | Admitting: *Deleted

## 2012-06-16 NOTE — Telephone Encounter (Signed)
On 06-16-12 gave form to Sam and Dr.Kinard.

## 2012-06-22 ENCOUNTER — Telehealth: Payer: Self-pay | Admitting: Radiation Oncology

## 2012-06-22 NOTE — Telephone Encounter (Signed)
Phoned patient at home and on her cell to inquire about [place] paperwork. No answer. Left message on both numbers requesting return call.

## 2012-06-27 ENCOUNTER — Encounter (INDEPENDENT_AMBULATORY_CARE_PROVIDER_SITE_OTHER): Payer: Self-pay | Admitting: General Surgery

## 2012-06-28 ENCOUNTER — Telehealth: Payer: Self-pay | Admitting: Radiation Oncology

## 2012-06-28 NOTE — Telephone Encounter (Signed)
Still attempting to complete [employer] paperwork for patient. Phoned patient at home and on her cell a second time after not hearing back following first message. No answer at home to option to leave message. Phoned cell phone. No answer. Left message requesting return call asap. Awaiting call so that paperwork will be completed properly.

## 2012-06-28 NOTE — Telephone Encounter (Signed)
Patient returned call. Questioned patient about employment status and [employer] paperwork. Patient states, "they did away with my position.Marland Kitcheni am not currently working.Marland Kitcheni have until October to find another position but, I am not sure I will go back with the company." Patient explains that following radiation therapy she had a tooth abscess causing her lots of pain and requiring an extensive surgery. Phoned Candise Bowens at [employer] [phone] to question if completed paperwork is still needed. Candise Bowens unable to release status of claim remain certain paperwork need to be completed and returned no matter the employment status of the patient. Therefore, placed paperwork back on Dr. Trina Ao desk for completion.

## 2012-07-03 ENCOUNTER — Telehealth: Payer: Self-pay | Admitting: *Deleted

## 2012-07-03 NOTE — Telephone Encounter (Signed)
On 07-03-12 fax short term disability form to [place] at [employer] and follow up note from 06-15-12, and medication list

## 2012-09-04 ENCOUNTER — Encounter: Payer: Self-pay | Admitting: Physician Assistant

## 2012-09-04 ENCOUNTER — Ambulatory Visit (INDEPENDENT_AMBULATORY_CARE_PROVIDER_SITE_OTHER): Payer: BC Managed Care – PPO | Admitting: Physician Assistant

## 2012-09-04 VITALS — BP 118/74 | HR 84 | Temp 97.9°F | Resp 18 | Wt 248.0 lb

## 2012-09-04 DIAGNOSIS — B0233 Zoster keratitis: Secondary | ICD-10-CM

## 2012-09-04 NOTE — Progress Notes (Signed)
Patient ID: Amanda Hoover MRN: [id number], DOB: 06/01/1955, 57 y.o. Date of Encounter: 09/04/2012, 4:38 PM    Chief Complaint:  Chief Complaint  Patient presents with  . c/o left eye infection x 1 week     HPI: 57 y.o. year old white female is here for evaluation of her left eye. Says for 8-9 days her left eye has hurt. Says "it sounds funny but my eyeball itself felt like it hurt". Then just yesterday, left upper eyelid became swollen and sore. She has noticed no rash/ skin lesions. No other area of pain.  Eyelids have not been crusted together in mornings. No drainage from eyes.   Home Meds: See attached medication section for any medications that were entered at today's visit. The computer does not put those onto this list.The following list is a list of meds entered prior to today's visit.   Current Outpatient Prescriptions on File Prior to Visit  Medication Sig Dispense Refill  . Calcium-Vitamin D (CALTRATE 600 PLUS-VIT D PO) Take by mouth.        . Cholecalciferol (VITAMIN D-3 PO) Take 5,000 Units by mouth.        . DiphenhydrAMINE HCl (BENADRYL PO) Take by mouth as needed.      Marland Kitchen emollient (BIAFINE) cream Apply 1 application topically 2 (two) times daily.      . Multiple Vitamin (MULTIVITAMIN) tablet Take 1 tablet by mouth daily.      . non-metallic deodorant Thornton Papas) MISC Apply 1 application topically daily as needed.      . pantoprazole (PROTONIX) 40 MG tablet Take 40 mg by mouth 2 (two) times daily.       . naproxen sodium (ANAPROX) 220 MG tablet Take 220 mg by mouth 2 (two) times daily with a meal.      . tamoxifen (NOLVADEX) 20 MG tablet Take 1 tablet (20 mg total) by mouth daily.  90 tablet  12  . Wound Cleansers (RADIAPLEX EX) Apply topically.       No current facility-administered medications on file prior to visit.    Allergies:  Allergies  Allergen Reactions  . Macadamia Nut Oil Anaphylaxis  . Other Swelling    POPCORN:  SWELLING THROAT  . Peanut-Containing  Drug Products Swelling and Other (See Comments)    THROAT TIGHTNESS; ALSO WALNUTS  . Tomato Swelling    SWELLING UVULA  . Adhesive (Tape) Other (See Comments)    BLISTERS  . Flour Other (See Comments)    SNEEZING  . Penicillins Hives  . Hydrocodone Hives  . Oxycodone   . Vicodin (Hydrocodone-Acetaminophen) Swelling    Pt had flushing, then itching and swelling of lips  . Eggs Or Egg-Derived Products Other (See Comments)    POSITIVE ON ALLERGY TEST, BUT EATS EGGS WITHOUT PROBLEM  . Soap Other (See Comments)    FRAGRANCES (SOAP/LOTION/PERFUME):  HEADACHE, RUNNY NOSE  . Tramadol Nausea And Vomiting      Review of Systems: See HPI for pertinent ROS. All other ROS negative.    Physical Exam: Blood pressure 118/74, pulse 84, temperature 97.9 F (36.6 C), temperature source Oral, resp. rate 18, weight 248 lb (112.492 kg)., Body mass index is 41.93 kg/(m^2). General: WNWD WF.  Appears in no acute distress. HEENT:Right Eye: Normal on inspection  Left Eye: Upper Eye Lid: slightly "puffy" edema. Minimal pink erythema. At lateral corner of eye there are two small papules/vessicles. (Each is approx 4mm diameter) No lesions on nose or any other area.  Neck: Supple. No thyromegaly. No lymphadenopathy. Lungs: Clear bilaterally to auscultation without wheezes, rales, or rhonchi. Breathing is unlabored. Heart: Regular rhythm. No murmurs, rubs, or gallops. Msk:  Strength and tone normal for age. Extremities/Skin: Warm and dry. No clubbing or cyanosis. No edema. No rashes or suspicious lesions. Neuro: Alert and oriented X 3. Moves all extremities spontaneously. Gait is normal. CNII-XII grossly in tact. Psych:  Responds to questions appropriately with a normal affect.     ASSESSMENT AND PLAN:  57 y.o. year old female with  1. Herpes zoster keratoconjunctivitis I am concerned this is herpes zoster. Staff has called Ophthalmology, sho said for pt to come to their office now. She will leave our  office and go straight to ophthalmology office. (Dr. Gwen Pounds) - Ambulatory referral to Ophthalmology   Signed, [facility], [place], [facility] 09/04/2012 4:38 PM

## 2012-09-05 ENCOUNTER — Encounter (INDEPENDENT_AMBULATORY_CARE_PROVIDER_SITE_OTHER): Payer: Self-pay | Admitting: General Surgery

## 2012-09-05 ENCOUNTER — Ambulatory Visit (INDEPENDENT_AMBULATORY_CARE_PROVIDER_SITE_OTHER): Payer: BC Managed Care – PPO | Admitting: General Surgery

## 2012-09-05 VITALS — BP 128/76 | HR 76 | Temp 97.8°F | Resp 16 | Ht 64.5 in | Wt 247.2 lb

## 2012-09-05 DIAGNOSIS — C50311 Malignant neoplasm of lower-inner quadrant of right female breast: Secondary | ICD-10-CM

## 2012-09-05 DIAGNOSIS — C50319 Malignant neoplasm of lower-inner quadrant of unspecified female breast: Secondary | ICD-10-CM

## 2012-09-05 NOTE — Progress Notes (Signed)
Patient ID: Amanda Hoover, female   DOB: 1956/02/14, 57 y.o.   MRN: [id number] History: This patient returns for long-term followup regarding her right breast cancer. She underwent right partial mastectomy and sentinel node biopsy in November 2013 for microinvasion DCIS, receptor negative, high-grade, pathologic stage TI A., N0. Posterior margin very close. I  re excised this area and there was no residual malignancy. She underwent radiation therapy and did well with that. She is followed by Dr. Darnelle Catalan. He  discussed chemoprevention with tamoxifen but she has not decided about that yet. She does have a little tightness in her right chest wall and right shoulder after radiation therapy. We talked about symmetry issues since her right breast is a little bit smaller. She says she wants to lose weight before she sees a Engineer, petroleum. We talked a long time. I gave her the names of plastic surgeons to call she wished for consultation.  Exam: Patient looks well. No distress Right breast and right axillary incision is well healed. Skin is healthy. A little volume loss medially due to the extent of resection. Range of motion right arm is about 90%. No arm swelling or sensory deficit  Assessment: Microinvasive, high-grade DCIS right breast, stage TI A., N0, receptor negative Uneventful recovery following right partial mastectomy, sentinel node biopsy, margin reexcision, and subsequent radiation therapy  Plan: She'll be referred to physical therapy to try to stretch out her right chest wall and shoulder. I think she will do well with that. followup with Dr. Darnelle Catalan  to discuss chemo-prevention She was given referral [facility] [employer]  [employer] to see about different bras and different inserts She was given the names of  plastic surgeons to call. She says she will think about this Bilateral mammograms November 2014 Return to see me December 2014.   Angelia Mould. Derrell Lolling, M.D., [facility] General and Minimally invasive Surgery Breast and Colorectal Surgery Office:   [phone] Pager:   [phone]

## 2012-09-05 NOTE — Patient Instructions (Signed)
Your examination today is normal. There is no evidence of cancer.  Keep your appointment with Dr. Darnelle Catalan in the future to discuss whether or not chemoprevention with tamoxifen would be a good idea.  You will be referred to physical therapy to stretch out the tight muscles in your chest wall and shoulder.  Be sure to get bilateral mammograms in November 2014.  If you decide that you wish to be referred to a plastic surgeon, let us know and we will help you with that.  Return to see Dr. Derrell Lolling in December, after you get your mammograms.

## 2012-09-06 ENCOUNTER — Other Ambulatory Visit (INDEPENDENT_AMBULATORY_CARE_PROVIDER_SITE_OTHER): Payer: Self-pay | Admitting: General Surgery

## 2012-09-06 DIAGNOSIS — C50911 Malignant neoplasm of unspecified site of right female breast: Secondary | ICD-10-CM

## 2012-09-06 NOTE — Addendum Note (Signed)
Addended byLiliana Cline on: 09/06/2012 01:57 PM   Modules accepted: Orders

## 2012-09-18 ENCOUNTER — Ambulatory Visit: Payer: BC Managed Care – PPO | Attending: General Surgery | Admitting: Physical Therapy

## 2012-09-18 DIAGNOSIS — IMO0001 Reserved for inherently not codable concepts without codable children: Secondary | ICD-10-CM | POA: Insufficient documentation

## 2012-09-18 DIAGNOSIS — Z923 Personal history of irradiation: Secondary | ICD-10-CM | POA: Insufficient documentation

## 2012-09-18 DIAGNOSIS — M25519 Pain in unspecified shoulder: Secondary | ICD-10-CM | POA: Insufficient documentation

## 2012-09-18 DIAGNOSIS — M24519 Contracture, unspecified shoulder: Secondary | ICD-10-CM | POA: Insufficient documentation

## 2012-09-18 DIAGNOSIS — C50919 Malignant neoplasm of unspecified site of unspecified female breast: Secondary | ICD-10-CM | POA: Insufficient documentation

## 2012-09-20 ENCOUNTER — Ambulatory Visit: Payer: BC Managed Care – PPO

## 2012-09-25 ENCOUNTER — Ambulatory Visit: Payer: BC Managed Care – PPO | Admitting: Physical Therapy

## 2012-09-27 ENCOUNTER — Ambulatory Visit: Payer: BC Managed Care – PPO | Admitting: Physical Therapy

## 2012-10-02 ENCOUNTER — Ambulatory Visit: Payer: BC Managed Care – PPO | Admitting: Physical Therapy

## 2012-10-04 ENCOUNTER — Ambulatory Visit: Payer: BC Managed Care – PPO

## 2012-10-10 ENCOUNTER — Ambulatory Visit: Payer: BC Managed Care – PPO

## 2012-10-16 ENCOUNTER — Ambulatory Visit: Payer: BC Managed Care – PPO | Attending: General Surgery

## 2012-10-16 DIAGNOSIS — C50919 Malignant neoplasm of unspecified site of unspecified female breast: Secondary | ICD-10-CM | POA: Insufficient documentation

## 2012-10-16 DIAGNOSIS — M25519 Pain in unspecified shoulder: Secondary | ICD-10-CM | POA: Insufficient documentation

## 2012-10-16 DIAGNOSIS — M24519 Contracture, unspecified shoulder: Secondary | ICD-10-CM | POA: Insufficient documentation

## 2012-10-16 DIAGNOSIS — Z923 Personal history of irradiation: Secondary | ICD-10-CM | POA: Insufficient documentation

## 2012-10-16 DIAGNOSIS — IMO0001 Reserved for inherently not codable concepts without codable children: Secondary | ICD-10-CM | POA: Insufficient documentation

## 2012-10-31 ENCOUNTER — Other Ambulatory Visit (INDEPENDENT_AMBULATORY_CARE_PROVIDER_SITE_OTHER): Payer: Self-pay | Admitting: General Surgery

## 2012-10-31 DIAGNOSIS — Z853 Personal history of malignant neoplasm of breast: Secondary | ICD-10-CM

## 2012-11-10 ENCOUNTER — Ambulatory Visit (INDEPENDENT_AMBULATORY_CARE_PROVIDER_SITE_OTHER): Payer: BC Managed Care – PPO | Admitting: Family Medicine

## 2012-11-10 ENCOUNTER — Encounter: Payer: Self-pay | Admitting: Family Medicine

## 2012-11-10 VITALS — BP 110/72 | HR 64 | Temp 98.1°F | Resp 18 | Ht 65.0 in | Wt 247.0 lb

## 2012-11-10 DIAGNOSIS — Z13220 Encounter for screening for lipoid disorders: Secondary | ICD-10-CM

## 2012-11-10 DIAGNOSIS — M25562 Pain in left knee: Secondary | ICD-10-CM

## 2012-11-10 DIAGNOSIS — M25569 Pain in unspecified knee: Secondary | ICD-10-CM

## 2012-11-10 LAB — COMPLETE METABOLIC PANEL WITH GFR
AST: 52 U/L — ABNORMAL HIGH (ref 0–37)
Alkaline Phosphatase: 109 U/L (ref 39–117)
BUN: 11 mg/dL (ref 6–23)
Creat: 0.9 mg/dL (ref 0.50–1.10)
Potassium: 4.5 mEq/L (ref 3.5–5.3)
Total Bilirubin: 0.7 mg/dL (ref 0.3–1.2)

## 2012-11-10 LAB — CBC WITH DIFFERENTIAL/PLATELET
Eosinophils Absolute: 0.2 10*3/uL (ref 0.0–0.7)
Hemoglobin: 14.3 g/dL (ref 12.0–15.0)
Lymphs Abs: 1.1 10*3/uL (ref 0.7–4.0)
MCH: 32.4 pg (ref 26.0–34.0)
Monocytes Relative: 11 % (ref 3–12)
Neutrophils Relative %: 61 % (ref 43–77)
RBC: 4.41 MIL/uL (ref 3.87–5.11)

## 2012-11-10 LAB — LIPID PANEL
Cholesterol: 253 mg/dL — ABNORMAL HIGH (ref 0–200)
Total CHOL/HDL Ratio: 6.2 Ratio
Triglycerides: 233 mg/dL — ABNORMAL HIGH (ref <150)
VLDL: 47 mg/dL — ABNORMAL HIGH (ref 0–40)

## 2012-11-10 MED ORDER — MELOXICAM 15 MG PO TABS: 15.0000 mg | ORAL_TABLET | Freq: Every day | ORAL | Status: DC

## 2012-11-10 NOTE — Progress Notes (Signed)
Subjective:    Patient ID: Amanda Hoover, female    DOB: 01/27/1956, 57 y.o.   MRN: [id number]  HPI Patient reports worsening left and right knee pain for more than a year. The pain in the left knee is the worst. She also reports episodic knee effusions which are primarily located on the lateral aspect of the left knee/patella.  The bone is actually tender to touch. She has a difficult time bending her knees. She has a difficult time rising from a squat or kneeling.  She reports crepitus.  She denies laxity or locking and knee joints.  She also needs her cholesterol checked for her insurance and work. She denies any chest pain shortness of breath or dyspnea on exertion. Her blood pressure is excellent. Past Medical History  Diagnosis Date  . GERD (gastroesophageal reflux disease)   . Hiatal hernia   . Osteopenia   . History of migraine   . Headache(784.0)     stress, sinus headaches  . Multiple environmental allergies   . History of colon polyps   . History of esophageal stricture     has had esophageal dilation x 1  . Palpitations     occasional; worse with ingestion of caffeine; no cardiologist  . Depression     no current meds.  . Dental crowns present   . Rash 01/06/2012    right elbow  . Rhinitis, allergic     rhinitis  . Hiatal hernia   . Chronic headaches   . Osteopenia   . 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  . Breast cancer, right     ER 0 PR0 LN neg.   Past Surgical History  Procedure Laterality Date  . Colonoscopy w/ polypectomy    . Partial mastectomy with needle localization and axillary sentinel lymph node bx  01/12/2012    Procedure: PARTIAL MASTECTOMY WITH NEEDLE LOCALIZATION AND AXILLARY SENTINEL LYMPH NODE BX;  Surgeon: Ernestene Mention, MD;  Location: [facility];  Service: General;  Laterality: Right;  . Esophagogastroduodenoscopy    . Re-excision of breast cancer,superior margins  02/15/2012   Procedure: RE-EXCISION OF BREAST CANCER,SUPERIOR MARGINS;  Surgeon: Ernestene Mention, MD;  Location: [facility];  Service: General;  Laterality: Right;  Re-excision of right lumpectomy, close posterior margin.   Current Outpatient Prescriptions on File Prior to Visit  Medication Sig Dispense Refill  . Calcium-Vitamin D (CALTRATE 600 PLUS-VIT D PO) Take by mouth.        . Cholecalciferol (VITAMIN D-3 PO) Take 5,000 Units by mouth.        . DiphenhydrAMINE HCl (BENADRYL PO) Take by mouth as needed.      . Multiple Vitamin (MULTIVITAMIN) tablet Take 1 tablet by mouth daily.      . non-metallic deodorant Thornton Papas) MISC Apply 1 application topically daily as needed.      . pantoprazole (PROTONIX) 40 MG tablet Take 40 mg by mouth 2 (two) times daily.       Marland Kitchen emollient (BIAFINE) cream Apply 1 application topically 2 (two) times daily.      . naproxen sodium (ANAPROX) 220 MG tablet Take 220 mg by mouth 2 (two) times daily with a meal.      . tamoxifen (NOLVADEX) 20 MG tablet Take 1 tablet (20 mg total) by mouth daily.  90 tablet  12  . Wound Cleansers (RADIAPLEX EX) Apply topically.       No current facility-administered medications  on file prior to visit.   Allergies  Allergen Reactions  . Macadamia Nut Oil Anaphylaxis  . Other Swelling    POPCORN:  SWELLING THROAT  . Peanut-Containing Drug Products Swelling and Other (See Comments)    THROAT TIGHTNESS; ALSO WALNUTS  . Tomato Swelling    SWELLING UVULA  . Adhesive [Tape] Other (See Comments)    BLISTERS  . Flour Other (See Comments)    SNEEZING  . Penicillins Hives  . Hydrocodone Hives  . Oxycodone   . Vicodin [Hydrocodone-Acetaminophen] Swelling    Pt had flushing, then itching and swelling of lips  . Eggs Or Egg-Derived Products Other (See Comments)    POSITIVE ON ALLERGY TEST, BUT EATS EGGS WITHOUT PROBLEM  . Soap Other (See Comments)    FRAGRANCES (SOAP/LOTION/PERFUME):  HEADACHE, RUNNY NOSE  . Tramadol Nausea And  Vomiting   History   Social History  . Marital Status: Married    Spouse Name: N/A    Number of Children: 4  . Years of Education: N/A   Occupational History  .      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 Smoking status: Former Smoker -- 0.50 packs/day for 5 years    Types: Cigarettes    Quit date: 03/15/2000  . Smokeless tobacco: Never Used  . Alcohol Use: No  . Drug Use: No     Comment: quit smoking 11 years ago  . Sexual Activity: Not on file   Other Topics Concern  . Not on file   Social History Narrative  . No narrative on file      Review of Systems  All other systems reviewed and are negative.       Objective:   Physical Exam  Vitals reviewed. Cardiovascular: Normal rate and regular rhythm.   No murmur heard. Pulmonary/Chest: Effort normal and breath sounds normal. No respiratory distress. She has no wheezes. She has no rales.  Abdominal: Soft. Bowel sounds are normal. She exhibits no distension. There is no tenderness. There is no rebound.  Musculoskeletal:       Right knee: She exhibits decreased range of motion and swelling. She exhibits no effusion, no erythema, normal alignment, no LCL laxity, normal patellar mobility, normal meniscus and no MCL laxity. Tenderness found. Lateral joint line tenderness noted. No medial joint line, no MCL and no LCL tenderness noted.       Left knee: She exhibits swelling and effusion. She exhibits normal range of motion, normal alignment, no LCL laxity, normal patellar mobility, normal meniscus and no MCL laxity. Tenderness found. Medial joint line and lateral joint line tenderness noted. No MCL and no LCL tenderness noted.          Assessment & Plan:  1. Screening cholesterol level - Lipid panel - COMPLETE METABOLIC PANEL WITH GFR  2. Knee pain, acute, left I suspect osteoarthritis. Obtain an x-ray of the left knee to evaluate further. Meanwhile begin meloxicam 15 mg by mouth daily for  arthritis. Recommended ice to the knees in the evenings for swelling and pain. Proceed with cortisone injection if x-ray confirms arthritis in the knee pain is not improved.  I also recommended diet exercise and weight loss to help prevent the arthritis from worsening. - DG Knee Complete 4 Views Left; Future - CBC with Differential - meloxicam (MOBIC) 15 MG tablet; Take 1 tablet (15 mg total) by mouth daily.  Dispense: 30 tablet; Refill: 0

## 2012-11-15 ENCOUNTER — Other Ambulatory Visit: Payer: Self-pay | Admitting: Family Medicine

## 2012-11-15 MED ORDER — ATORVASTATIN CALCIUM 20 MG PO TABS: 20.0000 mg | ORAL_TABLET | Freq: Every day | ORAL | Status: DC

## 2012-11-15 NOTE — Telephone Encounter (Signed)
Lipitor escribed.

## 2012-11-17 ENCOUNTER — Ambulatory Visit
Admission: RE | Admit: 2012-11-17 | Discharge: 2012-11-17 | Disposition: A | Discharge status: Home / Self Care | Payer: BC Managed Care – PPO | Source: Ambulatory Visit | Attending: Family Medicine | Admitting: Family Medicine

## 2012-11-17 DIAGNOSIS — M25562 Pain in left knee: Secondary | ICD-10-CM

## 2012-11-29 ENCOUNTER — Telehealth: Payer: Self-pay | Admitting: Genetic Counselor

## 2012-11-29 NOTE — Telephone Encounter (Signed)
CALLED TO SCHEDULE GENETIC APPT NOT ABLE TO LEAVE MESSAGE.

## 2012-11-30 ENCOUNTER — Telehealth: Payer: Self-pay | Admitting: Family Medicine

## 2012-11-30 MED ORDER — PANTOPRAZOLE SODIUM 40 MG PO TBEC: 40.0000 mg | DELAYED_RELEASE_TABLET | Freq: Two times a day (BID) | ORAL | Status: DC

## 2012-11-30 NOTE — Telephone Encounter (Signed)
Protonix 40 mg tab 1 BID #180

## 2012-11-30 NOTE — Telephone Encounter (Signed)
Rx Refilled  

## 2012-12-04 ENCOUNTER — Telehealth: Payer: Self-pay | Admitting: Family Medicine

## 2012-12-04 ENCOUNTER — Ambulatory Visit (HOSPITAL_BASED_OUTPATIENT_CLINIC_OR_DEPARTMENT_OTHER): Payer: BC Managed Care – PPO | Admitting: Genetic Counselor

## 2012-12-04 ENCOUNTER — Ambulatory Visit
Admission: RE | Admit: 2012-12-04 | Discharge: 2012-12-04 | Disposition: A | Discharge status: Home / Self Care | Payer: BC Managed Care – PPO | Source: Ambulatory Visit | Attending: General Surgery | Admitting: General Surgery

## 2012-12-04 ENCOUNTER — Other Ambulatory Visit: Payer: Self-pay | Admitting: Obstetrics

## 2012-12-04 ENCOUNTER — Encounter: Payer: Self-pay | Admitting: Genetic Counselor

## 2012-12-04 ENCOUNTER — Other Ambulatory Visit: Payer: BC Managed Care – PPO | Admitting: Lab

## 2012-12-04 DIAGNOSIS — M899 Disorder of bone, unspecified: Secondary | ICD-10-CM

## 2012-12-04 DIAGNOSIS — Z853 Personal history of malignant neoplasm of breast: Secondary | ICD-10-CM

## 2012-12-04 DIAGNOSIS — C50311 Malignant neoplasm of lower-inner quadrant of right female breast: Secondary | ICD-10-CM

## 2012-12-04 DIAGNOSIS — IMO0002 Reserved for concepts with insufficient information to code with codable children: Secondary | ICD-10-CM

## 2012-12-04 DIAGNOSIS — C50319 Malignant neoplasm of lower-inner quadrant of unspecified female breast: Secondary | ICD-10-CM

## 2012-12-04 MED ORDER — PANTOPRAZOLE SODIUM 40 MG PO TBEC: 40.0000 mg | DELAYED_RELEASE_TABLET | Freq: Two times a day (BID) | ORAL | Status: DC

## 2012-12-04 NOTE — Telephone Encounter (Signed)
Rx Refilled  

## 2012-12-04 NOTE — Telephone Encounter (Signed)
Protonix 40 mg tab 1 BID #180 °

## 2012-12-04 NOTE — Progress Notes (Signed)
Dr.  Viviann Spare requested a consultation for genetic counseling and risk assessment for Amanda Hoover, a 57 y.o. female, for discussion of her personal history of breast cancer and family history of breast cancer.  She presents to clinic today to discuss the possibility of a genetic predisposition to cancer, and to further clarify her risks, as well as her family members' risks for cancer.   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 This was treated with partial mastectomy and radiation. The tumor was ER-/PR-.  She has also had skin cancer, but is unsure what form it was.    Past Medical History  Diagnosis Date  . GERD (gastroesophageal reflux disease)   . Hiatal hernia   . Osteopenia   . History of migraine   . Headache(784.0)     stress, sinus headaches  . Multiple environmental allergies   . History of colon polyps   . History of esophageal stricture     has had esophageal dilation x 1  . Palpitations     occasional; worse with ingestion of caffeine; no cardiologist  . Depression     no current meds.  . Dental crowns present   . Rash 01/06/2012    right elbow  . Rhinitis, allergic     rhinitis  . Hiatal hernia   . Chronic headaches   . Osteopenia   . 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  . Breast cancer, right     ER 0 PR0 LN neg.    Past Surgical History  Procedure Laterality Date  . Colonoscopy w/ polypectomy    . Partial mastectomy with needle localization and axillary sentinel lymph node bx  01/12/2012    Procedure: PARTIAL MASTECTOMY WITH NEEDLE LOCALIZATION AND AXILLARY SENTINEL LYMPH NODE BX;  Surgeon: Ernestene Mention, MD;  Location: [facility];  Service: General;  Laterality: Right;  . Esophagogastroduodenoscopy    . Re-excision of breast cancer,superior margins  02/15/2012    Procedure: RE-EXCISION OF BREAST CANCER,SUPERIOR  MARGINS;  Surgeon: Ernestene Mention, MD;  Location: [facility];  Service: General;  Laterality: Right;  Re-excision of right lumpectomy, close posterior margin.    History   Social History  . Marital Status: Married    Spouse Name: N/A    Number of Children: 4  . Years of Education: N/A   Occupational History  .      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 Smoking status: Former Smoker -- 0.50 packs/day for 5 years    Types: Cigarettes    Quit date: 03/15/2000  . Smokeless tobacco: Never Used  . Alcohol Use: No  . Drug Use: No     Comment: quit smoking 11 years ago  . Sexual Activity: Not on file   Other Topics Concern  . Not on file   Social History Narrative  . No narrative on file    REPRODUCTIVE HISTORY AND PERSONAL RISK ASSESSMENT FACTORS: Menarche was at age 38.   postmenopausal Uterus Intact: yes Ovaries Intact: yes [id number], first live birth at age 84  She has not previously undergone treatment for infertility.   Oral Contraceptive use: 4 years   She has used HRT in the past.    FAMILY HISTORY:  We obtained a detailed, 4-generation family history.  Significant diagnoses are listed below: Family  History  Problem Relation Age of Onset  . Breast cancer    . Liver disease    . Crohn's disease Mother   . Irritable bowel syndrome Mother   . Esophageal cancer Father 79  . Breast cancer Paternal Aunt     dx in her early 13s  . Diabetes Maternal Grandmother   . Breast cancer Paternal Grandmother 60  . Diabetes Paternal Grandmother   . Cancer Maternal Uncle     unknown form of cancer  . Melanoma Cousin     maternal cousin dx in her 53s    Patient's maternal ancestors are of [place] descent, and paternal ancestors are of unknown descent. There is no reported Ashkenazi Jewish ancestry. There is no known consanguinity.  GENETIC COUNSELING ASSESSMENT: Amanda Hoover is a 57 y.o. female with a personal history of breast  cancer and family history of breast cancer which somewhat suggestive of a hereditary cancer syndrome and predisposition to cancer. We, therefore, discussed and recommended the following at today's visit.   DISCUSSION: We reviewed the characteristics, features and inheritance patterns of hereditary cancer syndromes. We also discussed genetic testing, including the appropriate family members to test, the process of testing, insurance coverage and turn-around-time for results.   PLAN: After considering the risks, benefits, and limitations, Amanda Hoover provided informed consent to pursue genetic testing and the blood sample will be sent to [facility] for analysis of the Breast/ovarian cancer panel. We discussed the implications of a positive, negative and/ or variant of uncertain significance genetic test result. Results should be available within approximately 3-4 weeks' time, at which point they will be disclosed by telephone to [facility], as will any additional recommendations warranted by these results. Amanda Hoover will receive a summary of her genetic counseling visit and a copy of her results once available. This information will also be available in Epic. We encouraged Amanda Hoover to remain in contact with cancer genetics annually so that we can continuously update the family history and inform her of any changes in cancer genetics and testing that may be of benefit for her family. Amanda Hoover's questions were answered to her satisfaction today. Our contact information was provided should additional questions or concerns arise.  The patient was seen for a total of 60 minutes, greater than 50% of which was spent face-to-face counseling.  This plan is being carried out per Dr. Feliz Beam recommendations.  This note will also be sent to the referring provider via the electronic medical record. The patient will be supplied with a summary of this genetic counseling discussion  as well as educational information on the discussed hereditary cancer syndromes following the conclusion of their visit.   Patient was discussed with Dr. Drue Second.   _______________________________________________________________________ For Office Staff:  Number of people involved in session: 1 Was an Intern/ student involved with case: yes

## 2012-12-07 ENCOUNTER — Telehealth: Payer: Self-pay | Admitting: Family Medicine

## 2012-12-07 NOTE — Telephone Encounter (Signed)
Pt feels very sick. She said she has chills and feels like she has the flu. She wants to talk to a nurse because she said she does not even feel like she can come in for an appt. Please call.

## 2012-12-07 NOTE — Telephone Encounter (Signed)
Called pt back and she feels too bad to come in and wanted to know what she could take and if there was something going around. I told her to treat symptoms of congestion and chills with tylenol and motrin and otc decongestants and the nausea with dramamine and ginger ale. If fever goes above 101 or no better by Monday to call and we will be glad to see her. Pt agreed.

## 2012-12-14 ENCOUNTER — Encounter: Payer: Self-pay | Admitting: Genetic Counselor

## 2012-12-14 ENCOUNTER — Telehealth: Payer: Self-pay | Admitting: Genetic Counselor

## 2012-12-14 NOTE — Telephone Encounter (Signed)
Revealed negative test results 

## 2012-12-25 ENCOUNTER — Telehealth: Payer: Self-pay | Admitting: Radiation Oncology

## 2012-12-25 NOTE — Telephone Encounter (Signed)
Returned patient's call. Patient reports that Dr. Jacinto Halim office will be faxing over today a form that needs to be completed so she can return to work immediately. She is requesting that the form be completed and fax to the number at the bottom of the form. Also, she is requested to be contacted at [phone] so she can pick up a hard copy of the form to submitted to her employer.

## 2012-12-27 ENCOUNTER — Telehealth: Payer: Self-pay | Admitting: Oncology

## 2012-12-27 NOTE — Telephone Encounter (Signed)
Called Amanda Hoover to let her know that her return to work paperwork had been faxed.  Told her the original was available for her to pick up at the Radiation Oncology waiting room desk.

## 2013-01-10 ENCOUNTER — Encounter: Payer: Self-pay | Admitting: Oncology

## 2013-01-11 ENCOUNTER — Ambulatory Visit
Admission: RE | Admit: 2013-01-11 | Discharge: 2013-01-11 | Disposition: A | Discharge status: Home / Self Care | Payer: BC Managed Care – PPO | Source: Ambulatory Visit | Attending: Radiation Oncology | Admitting: Radiation Oncology

## 2013-01-11 ENCOUNTER — Encounter: Payer: Self-pay | Admitting: Radiation Oncology

## 2013-01-11 VITALS — BP 132/64 | HR 74 | Temp 98.0°F | Resp 16 | Wt 246.8 lb

## 2013-01-11 DIAGNOSIS — C50311 Malignant neoplasm of lower-inner quadrant of right female breast: Secondary | ICD-10-CM

## 2013-01-11 NOTE — Progress Notes (Signed)
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  She is doing well this time. She occasionally will notice some mild discomfort within the breast but no consistent pain. She denies any nipple discharge or bleeding from the breast. Patient has reconsidered and will start taking tamoxifen. She did have a recent mammogram which showed what appeared to be benign calcifications within the lumpectomy cavity. The patient will undergo a repeat mammogram in 6 months.                              ALLERGIES:  is allergic to macadamia nut oil; other; peanut-containing drug products; tomato; adhesive; flour; penicillins; hydrocodone; oxycodone; vicodin; eggs or egg-derived products; soap; and tramadol.  Meds: Current Outpatient Prescriptions  Medication Sig Dispense Refill  . atorvastatin (LIPITOR) 20 MG tablet Take 1 tablet (20 mg total) by mouth at bedtime.  30 tablet  3  . Calcium-Vitamin D (CALTRATE 600 PLUS-VIT D PO) Take by mouth.        . Cholecalciferol (VITAMIN D-3 PO) Take 5,000 Units by mouth.        . meloxicam (MOBIC) 15 MG tablet Take 1 tablet (15 mg total) by mouth daily.  30 tablet  0  . Multiple Vitamin (MULTIVITAMIN) tablet Take 1 tablet by mouth daily.      . pantoprazole (PROTONIX) 40 MG tablet Take 1 tablet (40 mg total) by mouth 2 (two) times daily.  180 tablet  4  . DiphenhydrAMINE HCl (BENADRYL PO) Take by mouth as needed.      Marland Kitchen emollient (BIAFINE) cream Apply 1 application topically 2 (two) times daily.      . naproxen sodium (ANAPROX) 220 MG tablet Take 220 mg by mouth 2 (two) times daily with a meal.      . non-metallic deodorant  (ALRA) MISC Apply 1 application topically daily as needed.      . tamoxifen (NOLVADEX) 20 MG tablet Take 1 tablet (20 mg total) by mouth daily.  90 tablet  12  . Wound Cleansers (RADIAPLEX EX) Apply topically.       No current facility-administered medications for this encounter.    Physical Findings: The patient is in no acute distress. Patient is alert and oriented.  weight is 246 lb 12.8 oz (111.948 kg). Her oral temperature is 98 F (36.7 C). Her blood pressure is 132/64 and her pulse is 74. Her respiration is 16 and oxygen saturation is 99%. .  No palpable supraclavicular or axillary adenopathy. The lungs are clear to auscultation. The heart has a regular rhythm and rate.  examination of the left breast reveals no mass or nipple discharge. Examination right breast reveals a lumpectomy scar in the lower inner quadrant. There continues to be some mild edema in the breast and nipple area. There's mild induration at the lumpectomy site but no dominant masses appreciated. No nipple discharge or bleeding.  Lab Findings: Lab Results  Component Value Date   WBC 4.9 11/10/2012   HGB 14.3 11/10/2012   HCT 41.4 11/10/2012   MCV 93.9 11/10/2012   PLT 151 11/10/2012  Impression:  The patient is recovering from the effects of radiation.  No evidence of recurrence on clinical exam today  Plan:  When necessary followup in radiation oncology. The patient will continue close followup in medical oncology and surgery.  _____________________________________  -----------------------------------  Billie Lade, PhD, MD

## 2013-01-11 NOTE — Progress Notes (Signed)
Returned to work this week. Reports she isn't taking her Tamoxifen but, has decided she would like to start. Denies nipple discharge. Concerned about calcification evident on last mammogram and nervous to wait another six month for a repeat scan. Macules noted right upper chest; patient concerned they are cancerous. Occasional sharp shooting pain in right breast but, not as intense as it was following surgery. Skin of right breast has returned to normal color.

## 2013-01-18 ENCOUNTER — Ambulatory Visit
Admission: RE | Admit: 2013-01-18 | Discharge: 2013-01-18 | Disposition: A | Discharge status: Home / Self Care | Payer: BC Managed Care – PPO | Source: Ambulatory Visit | Attending: Obstetrics | Admitting: Obstetrics

## 2013-01-18 DIAGNOSIS — M899 Disorder of bone, unspecified: Secondary | ICD-10-CM

## 2013-01-23 ENCOUNTER — Encounter: Payer: Self-pay | Admitting: Family Medicine

## 2013-01-29 ENCOUNTER — Other Ambulatory Visit (HOSPITAL_BASED_OUTPATIENT_CLINIC_OR_DEPARTMENT_OTHER): Payer: BC Managed Care – PPO | Admitting: Lab

## 2013-01-29 ENCOUNTER — Telehealth: Payer: Self-pay | Admitting: *Deleted

## 2013-01-29 ENCOUNTER — Encounter: Payer: Self-pay | Admitting: Physician Assistant

## 2013-01-29 ENCOUNTER — Ambulatory Visit (HOSPITAL_BASED_OUTPATIENT_CLINIC_OR_DEPARTMENT_OTHER): Payer: BC Managed Care – PPO | Admitting: Physician Assistant

## 2013-01-29 VITALS — BP 112/75 | HR 80 | Temp 98.2°F | Resp 18 | Ht 65.0 in | Wt 242.6 lb

## 2013-01-29 DIAGNOSIS — D0590 Unspecified type of carcinoma in situ of unspecified breast: Secondary | ICD-10-CM

## 2013-01-29 DIAGNOSIS — D696 Thrombocytopenia, unspecified: Secondary | ICD-10-CM

## 2013-01-29 DIAGNOSIS — C50311 Malignant neoplasm of lower-inner quadrant of right female breast: Secondary | ICD-10-CM

## 2013-01-29 DIAGNOSIS — R921 Mammographic calcification found on diagnostic imaging of breast: Secondary | ICD-10-CM

## 2013-01-29 DIAGNOSIS — Z853 Personal history of malignant neoplasm of breast: Secondary | ICD-10-CM

## 2013-01-29 DIAGNOSIS — M899 Disorder of bone, unspecified: Secondary | ICD-10-CM

## 2013-01-29 DIAGNOSIS — Z171 Estrogen receptor negative status [ER-]: Secondary | ICD-10-CM

## 2013-01-29 LAB — COMPREHENSIVE METABOLIC PANEL (CC13)
Albumin: 3.9 g/dL (ref 3.5–5.0)
Anion Gap: 8 mEq/L (ref 3–11)
BUN: 11 mg/dL (ref 7.0–26.0)
CO2: 25 mEq/L (ref 22–29)
Glucose: 101 mg/dl (ref 70–140)
Potassium: 4.4 mEq/L (ref 3.5–5.1)
Sodium: 139 mEq/L (ref 136–145)
Total Bilirubin: 0.58 mg/dL (ref 0.20–1.20)
Total Protein: 7.7 g/dL (ref 6.4–8.3)

## 2013-01-29 LAB — CBC WITH DIFFERENTIAL/PLATELET
Basophils Absolute: 0 10*3/uL (ref 0.0–0.1)
Eosinophils Absolute: 0.1 10*3/uL (ref 0.0–0.5)
HGB: 14.3 g/dL (ref 11.6–15.9)
LYMPH%: 26.9 % (ref 14.0–49.7)
MCV: 95.7 fL (ref 79.5–101.0)
MONO#: 0.5 10*3/uL (ref 0.1–0.9)
MONO%: 12.5 % (ref 0.0–14.0)
NEUT#: 2.4 10*3/uL (ref 1.5–6.5)
Platelets: 134 10*3/uL — ABNORMAL LOW (ref 145–400)
RBC: 4.49 10*6/uL (ref 3.70–5.45)
WBC: 4.3 10*3/uL (ref 3.9–10.3)

## 2013-01-29 MED ORDER — TAMOXIFEN CITRATE 20 MG PO TABS: 20.0000 mg | ORAL_TABLET | Freq: Every day | ORAL | Status: DC

## 2013-01-29 NOTE — Progress Notes (Signed)
ID: Amanda Hoover   DOB: 1955-05-17  MR#: [id number]  WUJ#:[id number]  PCP: [name], MD GYN: De Hollingshead MD SU: Claud Kelp MD OTHER MD: Antony Blackbird, MD;  Claudette Head. MD;  Osborn Coho, MD    CHIEF COMPLAINT:  Right Breast Cancer   HISTORY OF PRESENT ILLNESS: Amanda Hoover had routine bilateral screening mammography 12/02/2011 showing heterogeneously dense breasts. New calcifications were noted in the right breast, and were further evaluated with right diagnostic mammography 12/10/2011. This confirmed a group of pleomorphic microcalcifications in the lower inner quadrant of the right breast. 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 On 12/24/2011 the patient underwent bilateral breast MRI. This showed post biopsy changes in the right lower inner quadrant, associated with an area of clumped nodular enhancement measuring up to 3.7 cm.  The patient's subsequent history is as detailed below.  INTERVAL HISTORY: Amanda Hoover returns alone today for followup of her right breast cancer. Since her appointment here in February 2014, she completed radiation therapy in March. She had been given a prescription for tamoxifen, but tells me she simply never had it filled and has not yet started on that medication. There was some question of whether she would take the tamoxifen or be followed with observation alone, and she tells me that, after quite a bit of thought, she has decided she would like to take the tamoxifen. She does need a new prescription today.   Interval history is also notable for genetic testing which found Eliabeth to be BRCA 1 and 2 negative. She also had a bilateral mammogram in September which showed right calcifications at the lumpectomy site, and short term followup was recommended. These results are  detailed below.   REVIEW OF SYSTEMS: Physically, Amanda Hoover had few complaints today. She does have some postsurgical pain in the right breast which is intermittent. 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 She recently had some abnormal moles removed which were "early skin cancers". She has some chronic sinus congestion and is currently taking Claritin, Flonase, and a nasal saline spray daily. She has headaches associated with the sinus congestion. She has some mild depression for which she is on Wellbutrin affectively.   Amanda Hoover has had no recent fevers, chills, night sweats, or hot flashes. She's had no abnormal bleeding, and specifically denies any vaginal bleeding. Her energy level is good. She is back to work full-time at [employer]. She's eating and drinking well and denies any nausea or change in bowel or bladder habits. She's had no increased cough, shortness of breath, or chest pain. She denies any unusual myalgias, arthralgias, or bony pain, and has had no peripheral swelling.  A detailed review systems is otherwise stable and noncontributory.    PAST MEDICAL HISTORY: Past Medical History  Diagnosis Date  . GERD (gastroesophageal reflux disease)   . Hiatal hernia   . Osteopenia   . History of migraine   . Headache(784.0)     stress, sinus headaches  . Multiple environmental allergies   . History of colon polyps   . History of esophageal stricture     has had esophageal dilation x 1  . Palpitations     occasional; worse with ingestion of caffeine; no cardiologist  . Depression     no current meds.  . Dental crowns  present   . Rash 01/06/2012    right elbow  . Rhinitis, allergic     rhinitis  . Hiatal hernia   . Chronic headaches   . Osteopenia   . 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  . Breast cancer, right     ER 0 PR0 LN neg.  . History of radiation therapy  03/28/2012-05/17/2012    60.4 gray to right breast  . Cancer     PAST SURGICAL HISTORY: Past Surgical History  Procedure Laterality Date  . Colonoscopy w/ polypectomy    . Partial mastectomy with needle localization and axillary sentinel lymph node bx  01/12/2012    Procedure: PARTIAL MASTECTOMY WITH NEEDLE LOCALIZATION AND AXILLARY SENTINEL LYMPH NODE BX;  Surgeon: Ernestene Mention, MD;  Location: [facility];  Service: General;  Laterality: Right;  . Esophagogastroduodenoscopy    . Re-excision of breast cancer,superior margins  02/15/2012    Procedure: RE-EXCISION OF BREAST CANCER,SUPERIOR MARGINS;  Surgeon: Ernestene Mention, MD;  Location: [facility];  Service: General;  Laterality: Right;  Re-excision of right lumpectomy, close posterior margin.    FAMILY HISTORY Family History  Problem Relation Age of Onset  . Breast cancer    . Liver disease    . Crohn's disease Mother   . Irritable bowel syndrome Mother   . Esophageal cancer Father 44  . Breast cancer Paternal Aunt     dx in her early 38s  . Diabetes Maternal Grandmother   . Breast cancer Paternal Grandmother 34  . Diabetes Paternal Grandmother   . Cancer Maternal Uncle     unknown form of cancer  . Melanoma Cousin     maternal cousin dx in her 59s   the patient's father died of cancer of the esophagus at age 58. He had been diagnosed with this shortly before. [name] is currently 48 years old (as of October 2013). [name] was diagnosed with breast cancer at the age of 64. [name] was also diagnosed with breast cancer, at the age of 34. [name] (her paternal grandmothers only sister) was diagnosed with breast cancer in her 60s. The patient had one brother who died from a viral illness at the age of 48. There is no history of ovarian cancer in the family.  GYNECOLOGIC HISTORY: Menarche age 35, last menstrual period at age 64.  The patient took hormone replacement for approximately one year. 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 Her husband [name] works in the [employer]. Son [name] is an Personnel officer and [employer]. Son [name] lives in [place] where he works as a Land. Daughter [name] works as a Naval architect in [employer]. The patient has 4 grandchildren. She attends a [employer].   ADVANCED DIRECTIVES: in place  HEALTH MAINTENANCE: (Updated November 2014) History  Substance Use Topics  . Smoking status: Former Smoker -- 0.50 packs/day for 5 years    Types: Cigarettes    Quit date: 03/15/2000  . Smokeless tobacco: Never Used  . Alcohol Use: No     Colonoscopy: 2013/Dr. Russella Dar  PAP: 2012/ Dr. Ernestina Penna  Bone density: 01/23/2013, osteopenia, T score is -1.7  Lipid panel: August 2014, elevated/ Dr. Tanya Nones    Allergies  Allergen Reactions  . Macadamia Nut Oil Anaphylaxis  .  Other Swelling    POPCORN:  SWELLING THROAT  . Peanut-Containing Drug Products Swelling and Other (See Comments)    THROAT TIGHTNESS; ALSO WALNUTS  . Tomato Swelling    SWELLING UVULA  . Adhesive [Tape] Other (See Comments)    BLISTERS  . Flour Other (See Comments)    SNEEZING  . Penicillins Hives  . Hydrocodone Hives  . Oxycodone   . Vicodin [Hydrocodone-Acetaminophen] Swelling    Pt had flushing, then itching and swelling of lips  . Eggs Or Egg-Derived Products Other (See Comments)    POSITIVE ON ALLERGY TEST, BUT EATS EGGS WITHOUT PROBLEM  . Soap Other (See Comments)    FRAGRANCES (SOAP/LOTION/PERFUME):  HEADACHE, RUNNY NOSE  . Tramadol Nausea And Vomiting    Current Outpatient Prescriptions  Medication Sig Dispense Refill  . atorvastatin (LIPITOR) 20 MG tablet Take 1 tablet (20 mg total) by mouth at bedtime.  30 tablet  3  . buPROPion (WELLBUTRIN XL) 150 MG 24 hr tablet Take  150 mg by mouth daily.      . Calcium-Vitamin D (CALTRATE 600 PLUS-VIT D PO) Take by mouth.        . Cholecalciferol (VITAMIN D-3 PO) Take 5,000 Units by mouth.        . DiphenhydrAMINE HCl (BENADRYL PO) Take by mouth as needed.      . meloxicam (MOBIC) 15 MG tablet Take 1 tablet (15 mg total) by mouth daily.  30 tablet  0  . Multiple Vitamin (MULTIVITAMIN) tablet Take 1 tablet by mouth daily.      . pantoprazole (PROTONIX) 40 MG tablet Take 1 tablet (40 mg total) by mouth 2 (two) times daily.  180 tablet  4  . tamoxifen (NOLVADEX) 20 MG tablet Take 1 tablet (20 mg total) by mouth daily.  90 tablet  1  . naproxen sodium (ANAPROX) 220 MG tablet Take 220 mg by mouth 2 (two) times daily with a meal.      . non-metallic deodorant (ALRA) MISC Apply 1 application topically daily as needed.       No current facility-administered medications for this visit.    OBJECTIVE: Middle-aged white woman who appears comfortable and is in no acute distress Filed Vitals:   01/29/13 1339  BP: 112/75  Pulse: 80  Temp: 98.2 F (36.8 C)  Resp: 18     Body mass index is 40.37 kg/(m^2).    ECOG FS: 0 Filed Weights   01/29/13 1339  Weight: 242 lb 9.6 oz (110.043 kg)   Physical Exam: HEENT:  Sclerae anicteric.  Oropharynx clear. Buccal mucosa is pink and moist NODES:  No cervical or supraclavicular lymphadenopathy palpated.  BREAST EXAM:  Right breast is status post lumpectomy. No suspicious nodularity or skin changes, no evidence of local recurrence. Left breast is unremarkable. Axillae are benign bilaterally, no palpable lymphadenopathy. LUNGS:  Clear to auscultation bilaterally.  No wheezes or rhonchi HEART:  Regular rate and rhythm. No murmur appreciated. ABDOMEN:  Soft, obese, nontender.  Positive bowel sounds.  MSK:  No focal spinal tenderness to palpation. Full range of motion in the upper extremities. No joint swelling. EXTREMITIES:  No peripheral edema.   NEURO:  Nonfocal. Well oriented.  Positive  affect.     LAB RESULTS: Lab Results  Component Value Date   WBC 4.3 01/29/2013   NEUTROABS 2.4 01/29/2013   HGB 14.3 01/29/2013   HCT 43.0 01/29/2013   MCV 95.7 01/29/2013   PLT 134* 01/29/2013      Chemistry  Component Value Date/Time   NA 139 01/29/2013 1301   NA 141 11/10/2012 0829   NA 138 08/30/2008   K 4.4 01/29/2013 1301   K 4.5 11/10/2012 0829   CL 105 11/10/2012 0829   CL 106 12/29/2011 1304   CO2 25 01/29/2013 1301   CO2 24 11/10/2012 0829   BUN 11.0 01/29/2013 1301   BUN 11 11/10/2012 0829   BUN 12 08/30/2008   CREATININE 0.9 01/29/2013 1301   CREATININE 0.90 11/10/2012 0829   CREATININE 0.93 02/21/2009 0605   CREATININE 1.0 08/30/2008   GLU 99 08/30/2008      Component Value Date/Time   CALCIUM 10.2 01/29/2013 1301   CALCIUM 9.8 11/10/2012 0829   ALKPHOS 143 01/29/2013 1301   ALKPHOS 109 11/10/2012 0829   AST 52* 01/29/2013 1301   AST 52* 11/10/2012 0829   ALT 53 01/29/2013 1301   ALT 57* 11/10/2012 0829   BILITOT 0.58 01/29/2013 1301   BILITOT 0.7 11/10/2012 0829      STUDIES:  Dg Bone Density  01/22/2013   CLINICAL DATA:  Postmenopausal.  EXAM: DUAL X-RAY ABSORPTIOMETRY (DXA) FOR BONE MINERAL DENSITY  COMPARISON:  The bone density of the lumbar spine as increased by 16.3% and the bone mineral density of the left hip has increased by 8.7% when compared to the baseline study dated 08/17/2010.  FINDINGS: AP LUMBAR SPINE (L3-L4)  Bone Mineral Density (BMD):  1.008 g/cm2  Young Adult T Score:  -0.8  Z Score:  0.6  Left FEMUR neck  Bone Mineral Density (BMD):  0.659 g/cm2  Young Adult T Score: -1.7  Z Score:  -0.5  ASSESSMENT: Patient's diagnostic category is low bone mass by WHO Criteria.  FRACTURE RISK: Moderate  FRAX:  Based on the World Health Organization FRAX model, the 10 year probability of a major osteoporotic fracture is 7.3%. The 10 year probability of a hip fracture is 0.7%.  RECOMMENDATIONS: Effective therapies are available in the form of  bisphosphonates, selective estrogen receptor modulators, biologic agents, and hormone replacement therapy (for women). All patients should ensure an adequate intake of dietary calcium (1200mg  daily) and vitamin D (800 IU daily) unless contraindicated.  All treatment decisions require clinical judgement and consideration of individual patient factors, including patient preferences, co-morbidities, previous drug use, risk factors not captured in the FRAX model (e.g., frailty, falls, vitamin D deficiency, increased bone turnover, interval significant decline in bone density) and possible under-or over-estimation of fracture risk by FRAX.  The National Osteoporosis Foundation recommends that FDA-approved medical  therapies be considered in postmenopausal women and mean age 8 or older with a:  Effective therapies are available in the form of bisphosphonates, selective estrogen receptor modulators, biologic agents, and hormone replacement therapy (for women). All patients should ensure an adequate intake of dietary calcium (1200mg  daily) and vitamin D (800 IU daily) unless contraindicated.  All treatment decisions require clinical judgement and consideration of individual patient factors, including patient preferences, co-morbidities, previous drug use, risk factors not captured in the FRAX model (e.g., frailty, falls, vitamin D deficiency, increased bone turnover, interval significant decline in bone density) and possible under-or over-estimation of fracture risk by FRAX.  The National Osteoporosis Foundation recommends that FDA-approved medical  therapies be considered in postmenopausal women and mean age 45 or older with a:  1. Hip or vertebral (clinical or morphometric) fracture.  2. T-score of -2.5 or lower at the spine or hip.  3. Ten-year fracture probability by FRAX of 3% or greater for  hip fracture or 20% or greater for major osteoporotic fracture.  FOLLOW UP:  People with diagnosed cases of osteoporosis or at  high risk for fracture should have regular bone mineral density tests. For patients eligible for Medicare, routine testing is allowed once every 2 years. The testing frequency can be increased to one year for patients who have rapidly progressing disease, those who are receiving or discontinuing medical therapy to restore bone mass, or have additional risk factors.  World Science writer [facility]) Criteria:  Normal: T scores from +1.0 to -1.0  Low Bone Mass (Osteopenia): T scores between -1.0 and -2.5  Osteoporosis: T scores -2.5 and below  Comparison to Reference Population:  T score is the key measure used in the diagnosis of osteoporosis and relative risk determination for fracture. It provides a value for bone mass relative to the mean bone mass of a young adult reference population expressed in terms of standard deviation (SD).  Z score is the age-matched score showing the patient's values compared to a population matched for age, sex, and race. This is also expressed in terms of standard deviation. The patient may have values that compare favorably to the age-matched values and still be at increased risk for fracture.   Electronically Signed   By: Baird Lyons M.D.   On: 01/22/2013 11:22    MM Digital Diagnostic Bilateral Mammogram with CAD  12/04/2012  CLINICAL DATA: Right lumpectomy in October 2013.  EXAM:  DIGITAL DIAGNOSTIC BILATERAL MAMMOGRAM with CAD.  DIGITAL BREAST TOMOSYNTHESIS  Digital breast tomosynthesis images are acquired in two projections.  These images are reviewed in combination with the digital mammogram,  confirming the findings below.  COMPARISON: Multiple priors  ACR Breast Density Category b: There is a scattered fibroglandular  pattern.  FINDINGS:  Standard CC and MLO views of both breasts were obtained in addition  to a spot magnification view on the right. Tomosynthesis images were  also performed. Architectural distortion and skin thickening of the  right breast are  identified, consistent with post treatment changes.  New calcifications are identified in the lumpectomy site on the spot  magnification view, likely representing fat necrosis. The left  breast is negative.  Insert CAD  IMPRESSION:  Right breast calcifications in the lumpectomy site, which are  probably benign.  RECOMMENDATION:  Six-month followup diagnostic right mammogram.  I have discussed the findings and recommendations with the patient.  Results were also provided in writing at the conclusion of the  visit. If applicable, a reminder letter will be sent to the patient  regarding the next appointment.  BI-RADS CATEGORY 3: Probably benign finding(s) - short interval  follow-up suggested.  Electronically Signed  By: Jerene Dilling M.D.  On: 12/04/2012 11:00   ASSESSMENT: 57 y.o.  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  We again reviewed possible side effects and toxicities associated with the tamoxifen.  She has decided she would like to go ahead and try taking the medication, with the understanding that if she has side effects she may discontinue the medication and be followed with observation alone.   We will plan on repeating her right diagnostic mammogram in 6 months as recommended,  March of 2015. We had  planned on seeing her back in one year, but since she is having a  repeat mammogram, and since she is just now getting ready to start her tamoxifen, we will plan on seeing her back in 6 months, late March or early April 2015.    She is asymptomatic of her mild thrombocytopenia, but we will repeat a CBC in 4-6 weeks. I think this is likely just a normal variation. In the meanwhile, she'll continue to followup with her other physicians, including Dr. Tanya Nones who she is scheduled to see next week. She will see Dr. Derrell Lolling in December for routine followup.  She voices understanding and agreement with this plan, and will call if she has any changes or problems.  Morgen Linebaugh PA-C     01/29/2013

## 2013-01-29 NOTE — Telephone Encounter (Signed)
appts made and printed...td 

## 2013-01-31 ENCOUNTER — Telehealth: Payer: Self-pay | Admitting: *Deleted

## 2013-01-31 DIAGNOSIS — C50311 Malignant neoplasm of lower-inner quadrant of right female breast: Secondary | ICD-10-CM

## 2013-01-31 MED ORDER — TAMOXIFEN CITRATE 20 MG PO TABS: 20.0000 mg | ORAL_TABLET | Freq: Every day | ORAL | Status: DC

## 2013-01-31 NOTE — Telephone Encounter (Signed)
Re-ordered at local pharmacy

## 2013-02-06 ENCOUNTER — Ambulatory Visit: Payer: BC Managed Care – PPO | Admitting: Family Medicine

## 2013-02-15 ENCOUNTER — Ambulatory Visit (INDEPENDENT_AMBULATORY_CARE_PROVIDER_SITE_OTHER): Payer: BC Managed Care – PPO | Admitting: General Surgery

## 2013-02-15 ENCOUNTER — Encounter (INDEPENDENT_AMBULATORY_CARE_PROVIDER_SITE_OTHER): Payer: Self-pay | Admitting: General Surgery

## 2013-02-15 VITALS — BP 108/74 | HR 64 | Temp 97.6°F | Resp 14 | Ht 64.5 in | Wt 242.6 lb

## 2013-02-15 DIAGNOSIS — C50311 Malignant neoplasm of lower-inner quadrant of right female breast: Secondary | ICD-10-CM

## 2013-02-15 DIAGNOSIS — C50319 Malignant neoplasm of lower-inner quadrant of unspecified female breast: Secondary | ICD-10-CM

## 2013-02-15 NOTE — Patient Instructions (Signed)
Examination of your right breast and lymph node areas is normal. There is no evidence of cancer by physical exam.  Your recent mammogram suggested what looks like benign calcifications, but because they are in the lumpectomy site I agree that we should repeat this mammogram in March.  Return to see Dr. Derrell Lolling in April.  Keeep appt. with Dr. Darnelle Catalan in January.  You were given a prescription for postlumpectomy bra and  prosthesis today.  Let  Dr. Derrell Lolling know if you change your mind about referral to a plastic surgeon.

## 2013-02-15 NOTE — Progress Notes (Signed)
Patient ID: Amanda Hoover, female   DOB: [birth year], 57 y.o.   MRN: [id number]  History:  This patient returns for long-term followup regarding her right breast cancer.  She underwent right partial mastectomy and sentinel node biopsy in November 2013 for microinvasion DCIS, receptor negative, high-grade, pathologic stage TI A., N0. Posterior margin very close. I re excised this area and there was no residual malignancy. She underwent radiation therapy and did well with that. She is followed by Dr. Darnelle Catalan. She is now on tamoxifen..  She does have a little tightness in her right chest wall and right shoulder after radiation therapy.  We talked about symmetry issues since her right breast is a little bit smaller. She still says she wants to lose weight before she sees a Engineer, petroleum. We talked a long time. I gave her the names of plastic surgeons to call she wished for consultation.   She had bilateral mammograms on 12/04/2012. There is some new calcifications the lumpectomy site Spot magnification view, but represented fat necrosis. Six-month followup was arranged. BI-RADS category 3.  Exam:  Patient looks well. No distress  Right breast and right axillary incision is well healed. Skin is healthy. A little volume loss medially due to the extent of resection. Range of motion right arm is about 100%. No arm swelling or sensory deficit  Lungs clear to auscultation bilaterally Heart regular rate and rhythm. No murmur. No ectopy. Neck no adenopathy or mass  Assessment: icroinvasive, high-grade DCIS right breast, stage TI A., N0, receptor negative  Uneventful recovery following right partial mastectomy, sentinel node biopsy, margin reexcision, and subsequent radiation therapy \ New calcifications right breast, lumpectomy site, but to be benign fat necrosis  Plan:  Keep appointment with Dr. Darnelle Catalan in January Prescription written for postlumpectomy bra and inserts. She declines referral to  plastic surgery at this time Right mammogram in March, 2015  Return to see me April 2015    Somaya Grassi M. Derrell Lolling, M.D., [facility]  General and Minimally invasive Surgery  Breast and Colorectal Surgery  Office: [phone]  Pager: [phone]

## 2013-03-05 ENCOUNTER — Telehealth: Payer: Self-pay | Admitting: *Deleted

## 2013-03-05 ENCOUNTER — Other Ambulatory Visit: Payer: Self-pay | Admitting: Physician Assistant

## 2013-03-05 ENCOUNTER — Other Ambulatory Visit (HOSPITAL_BASED_OUTPATIENT_CLINIC_OR_DEPARTMENT_OTHER): Payer: BC Managed Care – PPO

## 2013-03-05 DIAGNOSIS — C50311 Malignant neoplasm of lower-inner quadrant of right female breast: Secondary | ICD-10-CM

## 2013-03-05 DIAGNOSIS — C50319 Malignant neoplasm of lower-inner quadrant of unspecified female breast: Secondary | ICD-10-CM

## 2013-03-05 DIAGNOSIS — D696 Thrombocytopenia, unspecified: Secondary | ICD-10-CM

## 2013-03-05 DIAGNOSIS — D0590 Unspecified type of carcinoma in situ of unspecified breast: Secondary | ICD-10-CM

## 2013-03-05 LAB — CBC WITH DIFFERENTIAL/PLATELET
BASO%: 0.5 % (ref 0.0–2.0)
Basophils Absolute: 0 10*3/uL (ref 0.0–0.1)
EOS%: 3.5 % (ref 0.0–7.0)
HGB: 13.7 g/dL (ref 11.6–15.9)
LYMPH%: 27.3 % (ref 14.0–49.7)
MCH: 32 pg (ref 25.1–34.0)
MCHC: 33.7 g/dL (ref 31.5–36.0)
MCV: 95.1 fL (ref 79.5–101.0)
MONO%: 11 % (ref 0.0–14.0)
Platelets: 123 10*3/uL — ABNORMAL LOW (ref 145–400)
RBC: 4.28 10*6/uL (ref 3.70–5.45)
RDW: 13.7 % (ref 11.2–14.5)

## 2013-03-05 NOTE — Telephone Encounter (Signed)
Per PA-C request. Called pt to inform her of latest lab results. No answer. Left a message that overall labs are stable. We will repeat CBC again in late January 2015. If pt has any questions I told her to give me a call. Message to be forwarded to [employer], PA-C.

## 2013-03-06 ENCOUNTER — Telehealth: Payer: Self-pay | Admitting: Oncology

## 2013-03-06 ENCOUNTER — Other Ambulatory Visit: Payer: BC Managed Care – PPO

## 2013-03-06 DIAGNOSIS — Z79899 Other long term (current) drug therapy: Secondary | ICD-10-CM

## 2013-03-06 DIAGNOSIS — E785 Hyperlipidemia, unspecified: Secondary | ICD-10-CM

## 2013-03-06 LAB — LIPID PANEL
Cholesterol: 112 mg/dL (ref 0–200)
LDL Cholesterol: 47 mg/dL (ref 0–99)
VLDL: 13 mg/dL (ref 0–40)

## 2013-03-06 LAB — COMPLETE METABOLIC PANEL WITH GFR
ALT: 37 U/L — ABNORMAL HIGH (ref 0–35)
AST: 38 U/L — ABNORMAL HIGH (ref 0–37)
Albumin: 4.1 g/dL (ref 3.5–5.2)
BUN: 12 mg/dL (ref 6–23)
CO2: 25 mEq/L (ref 19–32)
Calcium: 9.2 mg/dL (ref 8.4–10.5)
Chloride: 106 mEq/L (ref 96–112)
Potassium: 4.2 mEq/L (ref 3.5–5.3)

## 2013-03-13 ENCOUNTER — Ambulatory Visit (INDEPENDENT_AMBULATORY_CARE_PROVIDER_SITE_OTHER): Payer: BC Managed Care – PPO | Admitting: Family Medicine

## 2013-03-13 ENCOUNTER — Encounter: Payer: Self-pay | Admitting: Family Medicine

## 2013-03-13 VITALS — BP 118/72 | HR 76 | Temp 98.0°F | Resp 18 | Wt 239.0 lb

## 2013-03-13 DIAGNOSIS — M899 Disorder of bone, unspecified: Secondary | ICD-10-CM

## 2013-03-13 DIAGNOSIS — M8580 Other specified disorders of bone density and structure, unspecified site: Secondary | ICD-10-CM

## 2013-03-13 DIAGNOSIS — Z23 Encounter for immunization: Secondary | ICD-10-CM

## 2013-03-13 DIAGNOSIS — E785 Hyperlipidemia, unspecified: Secondary | ICD-10-CM

## 2013-03-13 NOTE — Progress Notes (Signed)
Subjective:    Patient ID: Amanda Hoover, female    DOB: May 30, 1955, 57 y.o.   MRN: [id number]  HPI Patient was seen earlier this year. She was found have an LDL cholesterol above 160, triglycerides 200, and elevated liver function test consistent with fatty liver disease. At that time we started the patient on Lipitor 20 mg by mouth daily. She is here today for recheck of her lab work. Her most recent labwork as listed below: Lab on 03/06/2013  Component Date Value Range Status  . Cholesterol 03/06/2013 112  0 - 200 mg/dL Final   Comment: ATP III Classification:                                < 200        mg/dL        Desirable                               200 - 239     mg/dL        Borderline High                               >= 240        mg/dL        High                             . Triglycerides 03/06/2013 63  <150 mg/dL Final  . HDL 40/98/1191 52  >39 mg/dL Final  . Total CHOL/HDL Ratio 03/06/2013 2.2   Final  . VLDL 03/06/2013 13  0 - 40 mg/dL Final  . LDL Cholesterol 03/06/2013 47  0 - 99 mg/dL Final   Comment:                            Total Cholesterol/HDL Ratio:CHD Risk                                                 Coronary Heart Disease Risk Table                                                                 Men       Women                                   1/2 Average Risk              3.4        3.3                                       Average Risk  5.0        4.4                                    2X Average Risk              9.6        7.1                                    3X Average Risk             23.4       11.0                          Use the calculated Patient Ratio above and the CHD Risk table                           to determine the patient's CHD Risk.                          ATP III Classification (LDL):                                < 100        mg/dL         Optimal                               100 - 129     mg/dL          Near or Above Optimal                               130 - 159     mg/dL         Borderline High                               160 - 189     mg/dL         High                                > 190        mg/dL         Very High                             . Sodium 03/06/2013 140  135 - 145 mEq/L Final  . Potassium 03/06/2013 4.2  3.5 - 5.3 mEq/L Final  . Chloride 03/06/2013 106  96 - 112 mEq/L Final  . CO2 03/06/2013 25  19 - 32 mEq/L Final  . Glucose, Bld 03/06/2013 102* 70 - 99 mg/dL Final  . BUN 84/13/2440 12  6 - 23 mg/dL Final  . Creat 01/09/2535 0.87  0.50 - 1.10 mg/dL Final  . Total Bilirubin 03/06/2013 1.0  0.3 - 1.2 mg/dL Final  . Alkaline Phosphatase 03/06/2013 104  39 - 117 U/L Final  .  AST 03/06/2013 38* 0 - 37 U/L Final  . ALT 03/06/2013 37* 0 - 35 U/L Final  . Total Protein 03/06/2013 6.9  6.0 - 8.3 g/dL Final  . Albumin 96/06/5407 4.1  3.5 - 5.2 g/dL Final  . Calcium 81/19/1478 9.2  8.4 - 10.5 mg/dL Final  . GFR, Est African American 03/06/2013 84   Final  . GFR, Est Non African American 03/06/2013 73   Final   Comment:                            The estimated GFR is a calculation valid for adults (>=27 years old)                          that uses the CKD-EPI algorithm to adjust for age and sex. It is                            not to be used for children, pregnant women, hospitalized patients,                             patients on dialysis, or with rapidly changing kidney function.                          According to the NKDEP, eGFR >89 is normal, 60-89 shows mild                          impairment, 30-59 shows moderate impairment, 15-29 shows severe                          impairment and <15 is ESRD.                             Appointment on 03/05/2013  Component Date Value Range Status  . WBC 03/05/2013 4.0  3.9 - 10.3 10e3/uL Final  . NEUT# 03/05/2013 2.3  1.5 - 6.5 10e3/uL Final  . HGB 03/05/2013 13.7  11.6 - 15.9 g/dL Final  . HCT 29/56/2130 40.7  34.8 -  46.6 % Final  . Platelets 03/05/2013 123* 145 - 400 10e3/uL Final  . MCV 03/05/2013 95.1  79.5 - 101.0 fL Final  . MCH 03/05/2013 32.0  25.1 - 34.0 pg Final  . MCHC 03/05/2013 33.7  31.5 - 36.0 g/dL Final  . RBC 86/57/8469 4.28  3.70 - 5.45 10e6/uL Final  . RDW 03/05/2013 13.7  11.2 - 14.5 % Final  . lymph# 03/05/2013 1.1  0.9 - 3.3 10e3/uL Final  . MONO# 03/05/2013 0.4  0.1 - 0.9 10e3/uL Final  . Eosinophils Absolute 03/05/2013 0.1  0.0 - 0.5 10e3/uL Final  . Basophils Absolute 03/05/2013 0.0  0.0 - 0.1 10e3/uL Final  . NEUT% 03/05/2013 57.7  38.4 - 76.8 % Final  . LYMPH% 03/05/2013 27.3  14.0 - 49.7 % Final  . MONO% 03/05/2013 11.0  0.0 - 14.0 % Final  . EOS% 03/05/2013 3.5  0.0 - 7.0 % Final  . BASO% 03/05/2013 0.5  0.0 - 2.0 % Final  . nRBC 03/05/2013 0  0 - 0 % Final   Also the patient had a bone density test earlier this year  that revealed a T score of -1.7 at the left femur consistent with osteopenia.  She denies any myalgias or right upper quadrant pain. She is currently taking calcium and vitamin D for osteopenia. Past Medical History  Diagnosis Date  . GERD (gastroesophageal reflux disease)   . Hiatal hernia   . Osteopenia   . History of migraine   . Headache(784.0)     stress, sinus headaches  . Multiple environmental allergies   . History of colon polyps   . History of esophageal stricture     has had esophageal dilation x 1  . Palpitations     occasional; worse with ingestion of caffeine; no cardiologist  . Depression     no current meds.  . Dental crowns present   . Rash 01/06/2012    right elbow  . Rhinitis, allergic     rhinitis  . Hiatal hernia   . Chronic headaches   . Osteopenia   . 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  . Breast cancer, right     ER 0 PR0 LN neg.  . History of radiation therapy 03/28/2012-05/17/2012    60.4 gray to right breast  . Cancer   . Hyperlipidemia    Past Surgical History    Procedure Laterality Date  . Colonoscopy w/ polypectomy    . Partial mastectomy with needle localization and axillary sentinel lymph node bx  01/12/2012    Procedure: PARTIAL MASTECTOMY WITH NEEDLE LOCALIZATION AND AXILLARY SENTINEL LYMPH NODE BX;  Surgeon: Ernestene Mention, MD;  Location: [facility];  Service: General;  Laterality: Right;  . Esophagogastroduodenoscopy    . Re-excision of breast cancer,superior margins  02/15/2012    Procedure: RE-EXCISION OF BREAST CANCER,SUPERIOR MARGINS;  Surgeon: Ernestene Mention, MD;  Location: [facility];  Service: General;  Laterality: Right;  Re-excision of right lumpectomy, close posterior margin.   Current Outpatient Prescriptions on File Prior to Visit  Medication Sig Dispense Refill  . atorvastatin (LIPITOR) 20 MG tablet Take 1 tablet (20 mg total) by mouth at bedtime.  30 tablet  3  . buPROPion (WELLBUTRIN XL) 150 MG 24 hr tablet Take 150 mg by mouth daily.      . Calcium-Vitamin D (CALTRATE 600 PLUS-VIT D PO) Take by mouth.        . Cholecalciferol (VITAMIN D-3 PO) Take 5,000 Units by mouth.        . DiphenhydrAMINE HCl (BENADRYL PO) Take by mouth as needed.      . Multiple Vitamin (MULTIVITAMIN) tablet Take 1 tablet by mouth daily.      . Omega-3 Fatty Acids (FISH OIL PO) Take by mouth.      . pantoprazole (PROTONIX) 40 MG tablet Take 1 tablet (40 mg total) by mouth 2 (two) times daily.  180 tablet  4  . tamoxifen (NOLVADEX) 20 MG tablet Take 1 tablet (20 mg total) by mouth daily.  90 tablet  1  . naproxen sodium (ANAPROX) 220 MG tablet Take 220 mg by mouth 2 (two) times daily with a meal.       No current facility-administered medications on file prior to visit.   Allergies  Allergen Reactions  . Macadamia Nut Oil Anaphylaxis  . Other Swelling    POPCORN:  SWELLING THROAT  . Peanut-Containing Drug Products Swelling and Other (See Comments)    THROAT TIGHTNESS; ALSO WALNUTS  . Tomato Swelling    SWELLING  UVULA  . Adhesive [  Tape] Other (See Comments)    BLISTERS  . Flour Other (See Comments)    SNEEZING  . Penicillins Hives  . Hydrocodone Hives  . Oxycodone   . Vicodin [Hydrocodone-Acetaminophen] Swelling    Pt had flushing, then itching and swelling of lips  . Eggs Or Egg-Derived Products Other (See Comments)    POSITIVE ON ALLERGY TEST, BUT EATS EGGS WITHOUT PROBLEM  . Soap Other (See Comments)    FRAGRANCES (SOAP/LOTION/PERFUME):  HEADACHE, RUNNY NOSE  . Tramadol Nausea And Vomiting   History   Social History  . Marital Status: Married    Spouse Name: N/A    Number of Children: 4  . Years of Education: N/A   Occupational History  .      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 Smoking status: Former Smoker -- 0.50 packs/day for 5 years    Types: Cigarettes    Quit date: 03/15/2000  . Smokeless tobacco: Never Used  . Alcohol Use: No  . Drug Use: No     Comment: quit smoking 11 years ago  . Sexual Activity: Not on file   Other Topics Concern  . Not on file   Social History Narrative  . No narrative on file      Review of Systems  All other systems reviewed and are negative.       Objective:   Physical Exam  Vitals reviewed. Cardiovascular: Normal rate and regular rhythm.   Pulmonary/Chest: Effort normal and breath sounds normal.  Abdominal: Soft. Bowel sounds are normal.          Assessment & Plan:  1. HLD (hyperlipidemia) Liver function tests have improved as her cholesterol has improved. Cholesterol is now outstanding and well below the including her triglycerides. I continue to encourage therapeutic lifestyle changes including diet exercise and weight loss. I would recheck fasting lipid panel and CMP in 6 months.  2. Osteopenia Continue calcium and vitamin D. I will repeat a bone density test in 2-3 years. If her T score falls below -2 begin bisphosphonate.

## 2013-03-13 NOTE — Addendum Note (Signed)
Addended by: Donne Anon on: 03/13/2013 08:37 AM   Modules accepted: Orders

## 2013-04-03 ENCOUNTER — Other Ambulatory Visit: Payer: Self-pay | Admitting: Family Medicine

## 2013-04-03 MED ORDER — DOXYCYCLINE HYCLATE 50 MG PO CAPS: 50.0000 mg | ORAL_CAPSULE | Freq: Two times a day (BID) | ORAL | Status: DC

## 2013-04-03 MED ORDER — BUPROPION HCL ER (XL) 150 MG PO TB24: 150.0000 mg | ORAL_TABLET | Freq: Every day | ORAL | Status: DC

## 2013-04-03 MED ORDER — ATORVASTATIN CALCIUM 20 MG PO TABS: 20.0000 mg | ORAL_TABLET | Freq: Every day | ORAL | Status: DC

## 2013-04-03 NOTE — Telephone Encounter (Signed)
Rx Refilled  

## 2013-04-04 ENCOUNTER — Telehealth: Payer: Self-pay | Admitting: Oncology

## 2013-04-04 NOTE — Telephone Encounter (Signed)
, °

## 2013-04-05 ENCOUNTER — Other Ambulatory Visit: Payer: BC Managed Care – PPO

## 2013-04-12 ENCOUNTER — Other Ambulatory Visit (INDEPENDENT_AMBULATORY_CARE_PROVIDER_SITE_OTHER): Payer: Self-pay | Admitting: *Deleted

## 2013-04-12 MED ORDER — UNABLE TO FIND: Status: DC

## 2013-04-16 ENCOUNTER — Telehealth: Payer: Self-pay | Admitting: *Deleted

## 2013-04-16 NOTE — Telephone Encounter (Signed)
Pt called to rs her [facility] labs appt for 04/05/13. gv appt for 04/19/13@ 8:15am. Per pt request...td

## 2013-04-19 ENCOUNTER — Other Ambulatory Visit (HOSPITAL_BASED_OUTPATIENT_CLINIC_OR_DEPARTMENT_OTHER): Payer: BC Managed Care – PPO

## 2013-04-19 DIAGNOSIS — C50319 Malignant neoplasm of lower-inner quadrant of unspecified female breast: Secondary | ICD-10-CM

## 2013-04-19 DIAGNOSIS — D696 Thrombocytopenia, unspecified: Secondary | ICD-10-CM

## 2013-04-19 DIAGNOSIS — D0590 Unspecified type of carcinoma in situ of unspecified breast: Secondary | ICD-10-CM

## 2013-04-19 LAB — CBC WITH DIFFERENTIAL/PLATELET
BASO%: 0.2 % (ref 0.0–2.0)
Basophils Absolute: 0 10*3/uL (ref 0.0–0.1)
EOS%: 0.1 % (ref 0.0–7.0)
Eosinophils Absolute: 0 10*3/uL (ref 0.0–0.5)
HEMATOCRIT: 41 % (ref 34.8–46.6)
HGB: 14 g/dL (ref 11.6–15.9)
LYMPH#: 0.7 10*3/uL — AB (ref 0.9–3.3)
LYMPH%: 11.8 % — AB (ref 14.0–49.7)
MCH: 32.9 pg (ref 25.1–34.0)
MCHC: 34.2 g/dL (ref 31.5–36.0)
MCV: 96 fL (ref 79.5–101.0)
MONO#: 0.4 10*3/uL (ref 0.1–0.9)
MONO%: 5.9 % (ref 0.0–14.0)
NEUT#: 5.1 10*3/uL (ref 1.5–6.5)
NEUT%: 82 % — ABNORMAL HIGH (ref 38.4–76.8)
Platelets: 123 10*3/uL — ABNORMAL LOW (ref 145–400)
RBC: 4.27 10*6/uL (ref 3.70–5.45)
RDW: 14.2 % (ref 11.2–14.5)
WBC: 6.2 10*3/uL (ref 3.9–10.3)

## 2013-06-04 ENCOUNTER — Other Ambulatory Visit: Payer: Self-pay | Admitting: Physician Assistant

## 2013-06-04 ENCOUNTER — Ambulatory Visit
Admission: RE | Admit: 2013-06-04 | Discharge: 2013-06-04 | Disposition: A | Discharge status: Home / Self Care | Payer: BC Managed Care – PPO | Source: Ambulatory Visit | Attending: Physician Assistant | Admitting: Physician Assistant

## 2013-06-04 DIAGNOSIS — R921 Mammographic calcification found on diagnostic imaging of breast: Secondary | ICD-10-CM

## 2013-06-04 DIAGNOSIS — Z853 Personal history of malignant neoplasm of breast: Secondary | ICD-10-CM

## 2013-06-11 ENCOUNTER — Other Ambulatory Visit (HOSPITAL_BASED_OUTPATIENT_CLINIC_OR_DEPARTMENT_OTHER): Payer: BC Managed Care – PPO

## 2013-06-11 DIAGNOSIS — D0590 Unspecified type of carcinoma in situ of unspecified breast: Secondary | ICD-10-CM

## 2013-06-11 DIAGNOSIS — C50311 Malignant neoplasm of lower-inner quadrant of right female breast: Secondary | ICD-10-CM

## 2013-06-11 DIAGNOSIS — D696 Thrombocytopenia, unspecified: Secondary | ICD-10-CM

## 2013-06-11 LAB — CBC WITH DIFFERENTIAL/PLATELET
BASO%: 0.7 % (ref 0.0–2.0)
Basophils Absolute: 0 10*3/uL (ref 0.0–0.1)
EOS ABS: 0.1 10*3/uL (ref 0.0–0.5)
EOS%: 2.5 % (ref 0.0–7.0)
HCT: 37.9 % (ref 34.8–46.6)
HGB: 13 g/dL (ref 11.6–15.9)
LYMPH%: 27.5 % (ref 14.0–49.7)
MCH: 32.7 pg (ref 25.1–34.0)
MCHC: 34.3 g/dL (ref 31.5–36.0)
MCV: 95.5 fL (ref 79.5–101.0)
MONO#: 0.4 10*3/uL (ref 0.1–0.9)
MONO%: 9.7 % (ref 0.0–14.0)
NEUT%: 59.6 % (ref 38.4–76.8)
NEUTROS ABS: 2.6 10*3/uL (ref 1.5–6.5)
NRBC: 0 % (ref 0–0)
Platelets: 122 10*3/uL — ABNORMAL LOW (ref 145–400)
RBC: 3.97 10*6/uL (ref 3.70–5.45)
RDW: 13.9 % (ref 11.2–14.5)
WBC: 4.3 10*3/uL (ref 3.9–10.3)
lymph#: 1.2 10*3/uL (ref 0.9–3.3)

## 2013-06-11 LAB — COMPREHENSIVE METABOLIC PANEL (CC13)
ALT: 58 U/L — AB (ref 0–55)
ANION GAP: 13 meq/L — AB (ref 3–11)
AST: 55 U/L — ABNORMAL HIGH (ref 5–34)
Albumin: 3.7 g/dL (ref 3.5–5.0)
Alkaline Phosphatase: 116 U/L (ref 40–150)
BILIRUBIN TOTAL: 0.78 mg/dL (ref 0.20–1.20)
BUN: 13.2 mg/dL (ref 7.0–26.0)
CHLORIDE: 107 meq/L (ref 98–109)
CO2: 22 meq/L (ref 22–29)
Calcium: 9.7 mg/dL (ref 8.4–10.4)
Creatinine: 0.9 mg/dL (ref 0.6–1.1)
Glucose: 109 mg/dl (ref 70–140)
Potassium: 4.4 mEq/L (ref 3.5–5.1)
Sodium: 142 mEq/L (ref 136–145)
TOTAL PROTEIN: 7 g/dL (ref 6.4–8.3)

## 2013-06-18 ENCOUNTER — Encounter: Payer: Self-pay | Admitting: Physician Assistant

## 2013-06-18 ENCOUNTER — Ambulatory Visit: Payer: BC Managed Care – PPO | Admitting: Physician Assistant

## 2013-06-18 NOTE — Progress Notes (Signed)
[facility] today.  Letter mailed to patient.   Micah Flesher, PA-C 06/18/2013

## 2013-06-19 ENCOUNTER — Telehealth: Payer: Self-pay | Admitting: Physician Assistant

## 2013-06-19 ENCOUNTER — Telehealth: Payer: Self-pay | Admitting: Oncology

## 2013-06-19 ENCOUNTER — Encounter (INDEPENDENT_AMBULATORY_CARE_PROVIDER_SITE_OTHER): Payer: Self-pay | Admitting: General Surgery

## 2013-06-19 NOTE — Telephone Encounter (Signed)
Sent letter to patient from Amy Berry PA. °

## 2013-06-19 NOTE — Telephone Encounter (Signed)
, °

## 2013-07-04 ENCOUNTER — Telehealth: Payer: Self-pay | Admitting: Family Medicine

## 2013-07-04 NOTE — Telephone Encounter (Signed)
Patient is calling to get refill on her twin pack of epi pen if possible it is mail order and we would have to send fax to the [facility] fax number is [phone]

## 2013-07-05 MED ORDER — EPINEPHRINE 0.3 MG/0.3ML IJ SOAJ: 0.3000 mg | Freq: Once | INTRAMUSCULAR | Status: DC

## 2013-07-05 NOTE — Telephone Encounter (Signed)
Med sent to pharm as requested by pt 

## 2013-07-26 ENCOUNTER — Telehealth: Payer: Self-pay | Admitting: Oncology

## 2013-07-26 ENCOUNTER — Ambulatory Visit (HOSPITAL_BASED_OUTPATIENT_CLINIC_OR_DEPARTMENT_OTHER): Payer: BC Managed Care – PPO

## 2013-07-26 ENCOUNTER — Telehealth: Payer: Self-pay | Admitting: *Deleted

## 2013-07-26 ENCOUNTER — Ambulatory Visit (HOSPITAL_BASED_OUTPATIENT_CLINIC_OR_DEPARTMENT_OTHER): Payer: BC Managed Care – PPO | Admitting: Physician Assistant

## 2013-07-26 ENCOUNTER — Encounter: Payer: Self-pay | Admitting: Physician Assistant

## 2013-07-26 ENCOUNTER — Other Ambulatory Visit: Payer: Self-pay | Admitting: Physician Assistant

## 2013-07-26 VITALS — BP 122/57 | HR 71 | Temp 98.0°F | Resp 18 | Ht 64.5 in | Wt 237.1 lb

## 2013-07-26 DIAGNOSIS — Z853 Personal history of malignant neoplasm of breast: Secondary | ICD-10-CM

## 2013-07-26 DIAGNOSIS — R748 Abnormal levels of other serum enzymes: Secondary | ICD-10-CM

## 2013-07-26 DIAGNOSIS — T451X5A Adverse effect of antineoplastic and immunosuppressive drugs, initial encounter: Secondary | ICD-10-CM

## 2013-07-26 DIAGNOSIS — C50319 Malignant neoplasm of lower-inner quadrant of unspecified female breast: Secondary | ICD-10-CM

## 2013-07-26 DIAGNOSIS — M949 Disorder of cartilage, unspecified: Secondary | ICD-10-CM

## 2013-07-26 DIAGNOSIS — M899 Disorder of bone, unspecified: Secondary | ICD-10-CM

## 2013-07-26 DIAGNOSIS — Z171 Estrogen receptor negative status [ER-]: Secondary | ICD-10-CM

## 2013-07-26 DIAGNOSIS — D696 Thrombocytopenia, unspecified: Secondary | ICD-10-CM

## 2013-07-26 DIAGNOSIS — R232 Flushing: Secondary | ICD-10-CM

## 2013-07-26 LAB — CBC WITH DIFFERENTIAL/PLATELET
BASO%: 0.6 % (ref 0.0–2.0)
Basophils Absolute: 0 10*3/uL (ref 0.0–0.1)
EOS%: 2.4 % (ref 0.0–7.0)
Eosinophils Absolute: 0.1 10*3/uL (ref 0.0–0.5)
HEMATOCRIT: 42 % (ref 34.8–46.6)
HGB: 14 g/dL (ref 11.6–15.9)
LYMPH#: 1.4 10*3/uL (ref 0.9–3.3)
LYMPH%: 30.6 % (ref 14.0–49.7)
MCH: 32.7 pg (ref 25.1–34.0)
MCHC: 33.5 g/dL (ref 31.5–36.0)
MCV: 97.8 fL (ref 79.5–101.0)
MONO#: 0.5 10*3/uL (ref 0.1–0.9)
MONO%: 10.8 % (ref 0.0–14.0)
NEUT#: 2.6 10*3/uL (ref 1.5–6.5)
NEUT%: 55.6 % (ref 38.4–76.8)
PLATELETS: 129 10*3/uL — AB (ref 145–400)
RBC: 4.29 10*6/uL (ref 3.70–5.45)
RDW: 13.5 % (ref 11.2–14.5)
WBC: 4.7 10*3/uL (ref 3.9–10.3)

## 2013-07-26 LAB — COMPREHENSIVE METABOLIC PANEL (CC13)
ALK PHOS: 119 U/L (ref 40–150)
ALT: 78 U/L — AB (ref 0–55)
AST: 67 U/L — AB (ref 5–34)
Albumin: 4 g/dL (ref 3.5–5.0)
Anion Gap: 13 mEq/L — ABNORMAL HIGH (ref 3–11)
BILIRUBIN TOTAL: 0.9 mg/dL (ref 0.20–1.20)
BUN: 11.4 mg/dL (ref 7.0–26.0)
CO2: 21 mEq/L — ABNORMAL LOW (ref 22–29)
CREATININE: 0.9 mg/dL (ref 0.6–1.1)
Calcium: 10.3 mg/dL (ref 8.4–10.4)
Chloride: 107 mEq/L (ref 98–109)
Glucose: 96 mg/dl (ref 70–140)
Potassium: 4.3 mEq/L (ref 3.5–5.1)
Sodium: 141 mEq/L (ref 136–145)
Total Protein: 7.6 g/dL (ref 6.4–8.3)

## 2013-07-26 MED ORDER — GABAPENTIN 300 MG PO CAPS: 300.0000 mg | ORAL_CAPSULE | Freq: Every evening | ORAL | Status: DC | PRN

## 2013-07-26 NOTE — Telephone Encounter (Signed)
, °

## 2013-07-26 NOTE — Telephone Encounter (Signed)
Called pt to inform her of latest labs. Explained to pt abt the slight elevation of the liver enzymes and PA-C are wanting to do an U/S of abdomen to make sure there are no changes. Pt agreed. I told her we will get it scheduled and call her. Message to be forwarded to [employer], PA-C.

## 2013-07-26 NOTE — Progress Notes (Signed)
ID: AURORE REDINGER   DOB: [birth year]  MR#: [id number]  OIB#:[id number]  PCP: Odette Fraction, MD GYN: Thomes Dinning MD SU: Fanny Skates MD OTHER MD: Gery Pray, MD;  Lucio Edward. MD;  Jerrell Belfast, MD    CHIEF COMPLAINT:  Right Breast Cancer   HISTORY OF PRESENT ILLNESS: Blaine had routine bilateral screening mammography 12/02/2011 showing heterogeneously dense breasts. New calcifications were noted in the right breast, and were further evaluated with right diagnostic mammography 12/10/2011. This confirmed a group of pleomorphic microcalcifications in the lower inner quadrant of the right breast. 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 On 12/24/2011 the patient underwent bilateral breast MRI. This showed post biopsy changes in the right lower inner quadrant, associated with an area of clumped nodular enhancement measuring up to 3.7 cm.  The patient's subsequent history is as detailed below.  INTERVAL HISTORY: Amanda Hoover returns alone today for followup of her right breast cancer. Ivin Booty was last seen here in November. She had completed radiation in March, had a prescription for tamoxifen, but had not yet started the medication. After our appointment in November, she began on tamoxifen. She tells me Higinio Roger took the medication for approximately 2 months, then discontinued on her own. Her primary complaint was severe hot flashes, both during the day and at night. She had no vaginal changes, specifically no dryness, discharge, or abnormal vaginal bleeding. She's had no abnormal clotting. She denies any change in vision. She did notice that she had dry mouth when taking the medication.   REVIEW OF SYSTEMS: Amanda Hoover  has had no recent illnesses and denies any fevers or chills. The hot flashes did improve when she  discontinued the tamoxifen. Her energy level is fair. She's had no rashes or skin changes and denies any abnormal bruising or bleeding. Her appetite is good. She denies any nausea or emesis and has had no change in bowel or bladder habits. She has some shortness of breath with exertion which is stable. She denies any increased cough and has had no increased peripheral swelling, chest pain, or palpitations. She's had no abnormal headaches or dizziness. She feels somewhat forgetful at times. She has some depression which she feels is well-controlled on Wellbutrin. She denies any suicidal ideation or feelings of hopelessness.  A detailed review of systems is otherwise stable and noncontributory.    PAST MEDICAL HISTORY: Past Medical History  Diagnosis Date  . GERD (gastroesophageal reflux disease)   . Hiatal hernia   . Osteopenia   . History of migraine   . Headache(784.0)     stress, sinus headaches  . Multiple environmental allergies   . History of colon polyps   . History of esophageal stricture     has had esophageal dilation x 1  . Palpitations     occasional; worse with ingestion of caffeine; no cardiologist  . Depression     no current meds.  . Dental crowns present   . Rash 01/06/2012    right elbow  . Rhinitis, allergic     rhinitis  . Hiatal hernia   . Chronic headaches   . Osteopenia   . 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  . Breast cancer, right     ER 0 PR0 LN neg.  . History of radiation therapy 03/28/2012-05/17/2012    58 gray to right  breast  . Cancer   . Hyperlipidemia     PAST SURGICAL HISTORY: Past Surgical History  Procedure Laterality Date  . Colonoscopy w/ polypectomy    . Partial mastectomy with needle localization and axillary sentinel lymph node bx  01/12/2012    Procedure: PARTIAL MASTECTOMY WITH NEEDLE LOCALIZATION AND AXILLARY SENTINEL LYMPH NODE BX;  Surgeon: Adin Hector, MD;  Location: [facility];  Service: General;  Laterality: Right;  . Esophagogastroduodenoscopy    . Re-excision of breast cancer,superior margins  02/15/2012    Procedure: RE-EXCISION OF BREAST CANCER,SUPERIOR MARGINS;  Surgeon: Adin Hector, MD;  Location: [facility];  Service: General;  Laterality: Right;  Re-excision of right lumpectomy, close posterior margin.    FAMILY HISTORY Family History  Problem Relation Age of Onset  . Breast cancer    . Liver disease    . Crohn's disease Mother   . Irritable bowel syndrome Mother   . Esophageal cancer Father 38  . Breast cancer Paternal Aunt     dx in her early 2s  . Diabetes Maternal Grandmother   . Breast cancer Paternal Grandmother 88  . Diabetes Paternal Grandmother   . Cancer Maternal Uncle     unknown form of cancer  . Melanoma Cousin     maternal cousin dx in her 44s  [name] died of cancer of the esophagus at age 73. He had been diagnosed with this shortly before. [name] is currently 55 years old (as of October 2013). [name] was diagnosed with breast cancer at the age of 9. [name] was also diagnosed with breast cancer, at the age of 48. [name] (her paternal grandmothers only sister) was diagnosed with breast cancer in her 41s. The patient had one brother who died from a viral illness at the age of 63. There is no history of ovarian cancer in the family.  GYNECOLOGIC HISTORY:  (Reviewed 07/26/2013)  Menarche age 58, last menstrual period at age [age]. The patient took hormone replacement for approximately one year. She is GX P4, first live birth age 58  SOCIAL HISTORY: (updated  07/26/2013 ) Alexandria is working in [employer]  at [employer] currently, primarily [employer] . Her husband Marguerite Olea works in the [employer]. Son Vivia Budge is an Clinical biochemist and [place]. Son Hartley Barefoot lives in [place] where he works as a  Restaurant manager, fast food. Daughter Arcelia Jew works as a Scientist, clinical (histocompatibility and immunogenetics) in [employer]. The patient has 4 grandchildren. She attends a [employer].   ADVANCED DIRECTIVES: in place  HEALTH MAINTENANCE: (Updated 07/26/2013) History  Substance Use Topics  . Smoking status: Former Smoker -- 0.50 packs/day for 5 years    Types: Cigarettes    Quit date: 03/15/2000  . Smokeless tobacco: Never Used  . Alcohol Use: No     Colonoscopy: 2013/Dr. Fuller Plan  PAP: 2012/ Dr. Pamala Hurry  Bone density: 01/23/2013, osteopenia, T score is -1.7  Lipid panel: December 2014/ Dr. Dennard Schaumann    Allergies  Allergen Reactions  . Macadamia Nut Oil Anaphylaxis  . Other Swelling    POPCORN:  SWELLING THROAT  . Peanut-Containing Drug Products Swelling and Other (See Comments)    THROAT TIGHTNESS; ALSO WALNUTS  . Tomato Swelling    SWELLING UVULA  . Adhesive [Tape] Other (See Comments)    BLISTERS  . Flour Other (See Comments)    SNEEZING  . Penicillins Hives  .  Hydrocodone Hives  . Oxycodone   . Vicodin [Hydrocodone-Acetaminophen] Swelling    Pt had flushing, then itching and swelling of lips  . Eggs Or Egg-Derived Products Other (See Comments)    POSITIVE ON ALLERGY TEST, BUT EATS EGGS WITHOUT PROBLEM  . Soap Other (See Comments)    FRAGRANCES (SOAP/LOTION/PERFUME):  HEADACHE, RUNNY NOSE  . Tramadol Nausea And Vomiting    Current Outpatient Prescriptions  Medication Sig Dispense Refill  . atorvastatin (LIPITOR) 20 MG tablet Take 1 tablet (20 mg total) by mouth at bedtime.  90 tablet  3  . buPROPion (WELLBUTRIN XL) 150 MG 24 hr tablet Take 1 tablet (150 mg total) by mouth daily.  90 tablet  3  . Calcium-Vitamin D (CALTRATE 600 PLUS-VIT D PO) Take by mouth.        . cetirizine (ZYRTEC) 10 MG chewable tablet Chew 10 mg by mouth daily.      . Cholecalciferol (VITAMIN D-3 PO) Take 1,000 Units by mouth.       . Multiple Vitamin (MULTIVITAMIN) tablet Take 1 tablet by mouth daily.      . naproxen sodium  (ANAPROX) 220 MG tablet Take 220 mg by mouth 2 (two) times daily with a meal.      . Omega-3 Fatty Acids (FISH OIL PO) Take by mouth.      . pantoprazole (PROTONIX) 40 MG tablet Take 40 mg by mouth daily.      Marland Kitchen UNABLE TO FIND Rx: E2800- Silicone Brest Prosthesis, right (Quantity: 1) L8020- Mastectomy Form, Right (Quantity: 2) L8000- Mastectomy Bra (Quantity: 6) L4917- Post Mastectomy Garment (Quantity: 2) Dx: 174.9; Right mastectomy  1 each  0  . doxycycline (VIBRAMYCIN) 50 MG capsule Take 1 capsule (50 mg total) by mouth 2 (two) times daily.  180 capsule  3  . EPINEPHrine (EPIPEN) 0.3 mg/0.3 mL SOAJ injection Inject 0.3 mLs (0.3 mg total) into the muscle once.  2 Device  2  . gabapentin (NEURONTIN) 300 MG capsule Take 1 capsule (300 mg total) by mouth at bedtime as needed (hot flashes).  30 capsule  1  . tamoxifen (NOLVADEX) 20 MG tablet Take 1 tablet (20 mg total) by mouth daily.  90 tablet  1   No current facility-administered medications for this visit.    OBJECTIVE: Middle-aged white woman who appears comfortable and is in no acute distress Filed Vitals:   07/26/13 1143  BP: 122/57  Pulse: 71  Temp: 98 F (36.7 C)  Resp: 18     Body mass index is 40.08 kg/(m^2).    ECOG FS: 0 Filed Weights   07/26/13 1143  Weight: 237 lb 1.6 oz (107.548 kg)   Physical Exam: HEENT:  Sclerae anicteric.  Oropharynx clear, pink, and moist.  Neck supple, trachea midline. No thyromegaly palpated.  NODES:  No cervical or supraclavicular lymphadenopathy palpated.  BREAST EXAM:  Right breast is status post lumpectomy. No suspicious nodularity or skin changes.  Mild tenderness to palpation around the incision area.  No evidence of local recurrence. Left breast is unremarkable. Axillae are benign bilaterally, no palpable lymphadenopathy. LUNGS:  Clear to auscultation bilaterally with good excursion .  No wheezes or rhonchi HEART:  Regular rate and rhythm. No murmur appreciated. ABDOMEN:  Soft, obese,  nontender.  Positive bowel sounds.  no organomegaly or masses palpated. No hepatomegaly. MSK:  No focal spinal tenderness to palpation. Full range of motion in the upper extremities.  EXTREMITIES:  No peripheral edema.   No lymphedema in the  right upper extremity. NEURO:  Nonfocal. Well oriented.   Appropriate  affect.     LAB RESULTS: Lab Results  Component Value Date   WBC 4.3 06/11/2013   NEUTROABS 2.6 06/11/2013   HGB 13.0 06/11/2013   HCT 37.9 06/11/2013   MCV 95.5 06/11/2013   PLT 122* 06/11/2013      Chemistry      Component Value Date/Time   NA 142 06/11/2013 0838   NA 140 03/06/2013 0900   NA 138 08/30/2008   K 4.4 06/11/2013 0838   K 4.2 03/06/2013 0900   CL 106 03/06/2013 0900   CL 106 12/29/2011 1304   CO2 22 06/11/2013 0838   CO2 25 03/06/2013 0900   BUN 13.2 06/11/2013 0838   BUN 12 03/06/2013 0900   BUN 12 08/30/2008   CREATININE 0.9 06/11/2013 0838   CREATININE 0.87 03/06/2013 0900   CREATININE 0.93 02/21/2009 0605   CREATININE 1.0 08/30/2008   GLU 99 08/30/2008      Component Value Date/Time   CALCIUM 9.7 06/11/2013 0838   CALCIUM 9.2 03/06/2013 0900   ALKPHOS 116 06/11/2013 0838   ALKPHOS 104 03/06/2013 0900   AST 55* 06/11/2013 0838   AST 38* 03/06/2013 0900   ALT 58* 06/11/2013 0838   ALT 37* 03/06/2013 0900   BILITOT 0.78 06/11/2013 0838   BILITOT 1.0 03/06/2013 0900      STUDIES:  Dg Bone Density 01/22/2013   CLINICAL DATA:  Postmenopausal.  EXAM: DUAL X-RAY ABSORPTIOMETRY (DXA) FOR BONE MINERAL DENSITY  COMPARISON:  The bone density of the lumbar spine as increased by 16.3% and the bone mineral density of the left hip has increased by 8.7% when compared to the baseline study dated 08/17/2010.  FINDINGS: AP LUMBAR SPINE (L3-L4)  Bone Mineral Density (BMD):  1.008 g/cm2  Young Adult T Score:  -0.8  Z Score:  0.6  Left FEMUR neck  Bone Mineral Density (BMD):  0.659 g/cm2  Young Adult T Score: -1.7  Z Score:  -0.5  ASSESSMENT: Patient's diagnostic category is  low bone mass by WHO Criteria.  FRACTURE RISK: Moderate  FRAX:  Based on the [place], the 10 year probability of a major osteoporotic fracture is 7.3%. The 10 year probability of a hip fracture is 0.7%.  RECOMMENDATIONS: Effective therapies are available in the form of bisphosphonates, selective estrogen receptor modulators, biologic agents, and hormone replacement therapy (for women). All patients should ensure an adequate intake of dietary calcium (1282m daily) and vitamin D (800 IU daily) unless contraindicated.  All treatment decisions require clinical judgement and consideration of individual patient factors, including patient preferences, co-morbidities, previous drug use, risk factors not captured in the FRAX model (e.g., frailty, falls, vitamin D deficiency, increased bone turnover, interval significant decline in bone density) and possible under-or over-estimation of fracture risk by FRAX.  The National Osteoporosis Foundation recommends that FDA-approved medical  therapies be considered in postmenopausal women and mean age 3739or older with a:  Effective therapies are available in the form of bisphosphonates, selective estrogen receptor modulators, biologic agents, and hormone replacement therapy (for women). All patients should ensure an adequate intake of dietary calcium (12068mdaily) and vitamin D (800 IU daily) unless contraindicated.  All treatment decisions require clinical judgement and consideration of individual patient factors, including patient preferences, co-morbidities, previous drug use, risk factors not captured in the FRAX model (e.g., frailty, falls, vitamin D deficiency, increased bone turnover, interval significant decline in bone density) and  possible under-or over-estimation of fracture risk by FRAX.  The National Osteoporosis Foundation recommends that FDA-approved medical  therapies be considered in postmenopausal women and mean age 84 or older with a:   1. Hip or vertebral (clinical or morphometric) fracture.  2. T-score of -2.5 or lower at the spine or hip.  3. Ten-year fracture probability by FRAX of 3% or greater for hip fracture or 20% or greater for major osteoporotic fracture.  FOLLOW UP:  People with diagnosed cases of osteoporosis or at high risk for fracture should have regular bone mineral density tests. For patients eligible for Medicare, routine testing is allowed once every 2 years. The testing frequency can be increased to one year for patients who have rapidly progressing disease, those who are receiving or discontinuing medical therapy to restore bone mass, or have additional risk factors.  World Pharmacologist [facility]) Criteria:  Normal: T scores from +1.0 to -1.0  Low Bone Mass (Osteopenia): T scores between -1.0 and -2.5  Osteoporosis: T scores -2.5 and below  Comparison to Reference Population:  T score is the key measure used in the diagnosis of osteoporosis and relative risk determination for fracture. It provides a value for bone mass relative to the mean bone mass of a young adult reference population expressed in terms of standard deviation (SD).  Z score is the age-matched score showing the patient's values compared to a population matched for age, sex, and race. This is also expressed in terms of standard deviation. The patient may have values that compare favorably to the age-matched values and still be at increased risk for fracture.   Electronically Signed   By: Lillia Mountain M.D.   On: 01/22/2013 11:22    MM Digital Diagnostic Bilateral Mammogram with CAD 12/04/2012  CLINICAL DATA: Right lumpectomy in October 2013.  EXAM:  DIGITAL DIAGNOSTIC BILATERAL MAMMOGRAM with CAD.  DIGITAL BREAST TOMOSYNTHESIS  Digital breast tomosynthesis images are acquired in two projections.  These images are reviewed in combination with the digital mammogram,  confirming the findings below.  COMPARISON: Multiple priors  ACR Breast Density  Category b: There is a scattered fibroglandular  pattern.  FINDINGS:  Standard CC and MLO views of both breasts were obtained in addition  to a spot magnification view on the right. Tomosynthesis images were  also performed. Architectural distortion and skin thickening of the  right breast are identified, consistent with post treatment changes.  New calcifications are identified in the lumpectomy site on the spot  magnification view, likely representing fat necrosis. The left  breast is negative.  Insert CAD  IMPRESSION:  Right breast calcifications in the lumpectomy site, which are  probably benign.  RECOMMENDATION:  Six-month followup diagnostic right mammogram.  I have discussed the findings and recommendations with the patient.  Results were also provided in writing at the conclusion of the  visit. If applicable, a reminder letter will be sent to the patient  regarding the next appointment.  BI-RADS CATEGORY 3: Probably benign finding(s) - short interval  follow-up suggested.  Electronically Signed  By: Donavan Burnet M.D.  On: 12/04/2012 11:00   MM Diag Breast Tomo Uni Right 06/04/2013   CLINICAL DATA: Followup right breast probably benign  calcifications. Status post right lumpectomy and radiation therapy  for breast cancer in 2013.  EXAM:  DIGITAL DIAGNOSTIC RIGHT MAMMOGRAM WITH CAD  COMPARISON: Previous examinations.  ACR Breast Density Category b: There are scattered areas of  fibroglandular density.  FINDINGS:  Stable post lumpectomy changes  on the right. The previously  demonstrated probably benign calcifications are unchanged. No new  findings suspicious for malignancy.  Mammographic images were processed with CAD.  IMPRESSION:  Stable right breast probably benign calcifications, most likely  associated with fat necrosis.  RECOMMENDATION:  Bilateral diagnostic mammogram in 6 months. That will be 1 year  since mammographic evaluation of the left breast.   I have discussed the findings and recommendations with the patient.  Results were also provided in writing at the conclusion of the  visit. If applicable, a reminder letter will be sent to the patient  regarding the next appointment.  BI-RADS CATEGORY 3: Probably benign finding(s) - short interval  follow-up suggested.  Electronically Signed  By: Enrique Sack M.D.  On: 06/04/2013 11:40    ASSESSMENT: 58 y.o.  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 Restarting tamoxifen in May 2015.   (5)  osteopenia, T score -1.7 in November 2014    PLAN:  The majority of our 25 minute appointment today was spent reviewing recent labs and mammogram report, counseling the patient with regards to her treatment options, discussing possible side effects and toxicities, and coordinating care.  Amanda Hoover  appears to be doing well overall. She is concerned about the calcifications being followed in her right breast, and of course these will be evaluated once again in 6 months when she has her bilateral mammogram in September.   We again discussed both side effects and benefits associated with tamoxifen. She would really like to try the medication again, and we discussed using a low dose of gabapentin (300 mg) at night to see if this helps the hot flashes and makes the tamoxifen a little more tolerable. Once again, if she finds that she is unable to tolerate  the tamoxifen, she will discontinue the medication and we will follow her with observation alone.   We are repeating Klea's labs today, including a CBC to follow her mildthrombocytopenia and a metabolic panel to follow her elevated liver enzymes.  I will mention that she has a long-standing history of elevated liver enzymes which have been followed by her primary care physician, and this has actually been evaluated in the past with abdominal ultrasound which was unremarkable. She also continues on Lipitor which could be contributing to the elevation. We will continue to follow these numbers, and if we begin to see an upward trend, we could consider additional imaging once again, either an ultrasound or MRI of the liver for further evaluation. Thus far, there is no trend, and the enzymes are stable over all.  Otherwise, we will see Anaysia back in early October for labs and physical exam, just after her repeat mammogram in September. She will again try the tamoxifen, and will start on gabapentin 300 mg nightly for the hot flashes. We reviewed the above plan in detail today, and she voices her understanding and agreement. Of course she knows to call any time with any changes  or problems.   Ninette Cotta Milda Smart PA-C     07/26/2013

## 2013-08-01 ENCOUNTER — Telehealth: Payer: Self-pay

## 2013-08-01 ENCOUNTER — Ambulatory Visit (HOSPITAL_COMMUNITY)
Admission: RE | Admit: 2013-08-01 | Discharge: 2013-08-01 | Disposition: A | Discharge status: Home / Self Care | Payer: BC Managed Care – PPO | Source: Ambulatory Visit | Attending: Physician Assistant | Admitting: Physician Assistant

## 2013-08-01 DIAGNOSIS — C50319 Malignant neoplasm of lower-inner quadrant of unspecified female breast: Secondary | ICD-10-CM

## 2013-08-01 DIAGNOSIS — R748 Abnormal levels of other serum enzymes: Secondary | ICD-10-CM | POA: Insufficient documentation

## 2013-08-01 NOTE — Telephone Encounter (Signed)
LMOVM - [place] no masses or evidence of mets.  Elevated liver enzymes likely fatty liver.  Will repeat in Oct.  If pt has any questions, should call clinic and ask for [facility].

## 2013-09-18 ENCOUNTER — Other Ambulatory Visit: Payer: BC Managed Care – PPO

## 2013-09-18 DIAGNOSIS — E785 Hyperlipidemia, unspecified: Secondary | ICD-10-CM

## 2013-09-18 DIAGNOSIS — Z79899 Other long term (current) drug therapy: Secondary | ICD-10-CM

## 2013-09-18 LAB — CBC WITH DIFFERENTIAL/PLATELET
Basophils Absolute: 0 10*3/uL (ref 0.0–0.1)
Basophils Relative: 1 % (ref 0–1)
EOS ABS: 0.1 10*3/uL (ref 0.0–0.7)
Eosinophils Relative: 3 % (ref 0–5)
HEMATOCRIT: 39 % (ref 36.0–46.0)
HEMOGLOBIN: 13.6 g/dL (ref 12.0–15.0)
Lymphocytes Relative: 34 % (ref 12–46)
Lymphs Abs: 1.5 10*3/uL (ref 0.7–4.0)
MCH: 32.3 pg (ref 26.0–34.0)
MCHC: 34.9 g/dL (ref 30.0–36.0)
MCV: 92.6 fL (ref 78.0–100.0)
MONO ABS: 0.3 10*3/uL (ref 0.1–1.0)
MONOS PCT: 8 % (ref 3–12)
Neutro Abs: 2.3 10*3/uL (ref 1.7–7.7)
Neutrophils Relative %: 54 % (ref 43–77)
Platelets: 124 10*3/uL — ABNORMAL LOW (ref 150–400)
RBC: 4.21 MIL/uL (ref 3.87–5.11)
RDW: 13.6 % (ref 11.5–15.5)
WBC: 4.3 10*3/uL (ref 4.0–10.5)

## 2013-09-18 LAB — COMPREHENSIVE METABOLIC PANEL
ALBUMIN: 4.2 g/dL (ref 3.5–5.2)
ALK PHOS: 93 U/L (ref 39–117)
ALT: 49 U/L — ABNORMAL HIGH (ref 0–35)
AST: 46 U/L — ABNORMAL HIGH (ref 0–37)
BILIRUBIN TOTAL: 0.8 mg/dL (ref 0.2–1.2)
BUN: 15 mg/dL (ref 6–23)
CO2: 24 meq/L (ref 19–32)
Calcium: 9.3 mg/dL (ref 8.4–10.5)
Chloride: 105 mEq/L (ref 96–112)
Creat: 0.94 mg/dL (ref 0.50–1.10)
GLUCOSE: 95 mg/dL (ref 70–99)
POTASSIUM: 4.5 meq/L (ref 3.5–5.3)
Sodium: 141 mEq/L (ref 135–145)
Total Protein: 6.7 g/dL (ref 6.0–8.3)

## 2013-09-18 LAB — LIPID PANEL
Cholesterol: 105 mg/dL (ref 0–200)
HDL: 46 mg/dL (ref >39)
LDL CALC: 49 mg/dL (ref 0–99)
TRIGLYCERIDES: 52 mg/dL (ref <150)
Total CHOL/HDL Ratio: 2.3 Ratio
VLDL: 10 mg/dL (ref 0–40)

## 2013-09-20 ENCOUNTER — Encounter: Payer: Self-pay | Admitting: Family Medicine

## 2013-09-20 ENCOUNTER — Ambulatory Visit (INDEPENDENT_AMBULATORY_CARE_PROVIDER_SITE_OTHER): Payer: BC Managed Care – PPO | Admitting: Family Medicine

## 2013-09-20 VITALS — BP 104/62 | HR 76 | Temp 97.3°F | Resp 16 | Ht 64.5 in | Wt 234.0 lb

## 2013-09-20 DIAGNOSIS — B351 Tinea unguium: Secondary | ICD-10-CM

## 2013-09-20 DIAGNOSIS — E785 Hyperlipidemia, unspecified: Secondary | ICD-10-CM

## 2013-09-20 MED ORDER — TERBINAFINE HCL 250 MG PO TABS: 250.0000 mg | ORAL_TABLET | Freq: Every day | ORAL | Status: DC

## 2013-09-20 NOTE — Progress Notes (Signed)
Subjective:    Patient ID: Amanda Hoover, female    DOB: 03-15-1956, 58 y.o.   MRN: [id number]  HPI Patient is here to recheck her cholesterol.  She is currently taking Lipitor 20 mg by mouth daily. She denies any myalgias right upper quadrant pain. Her liver tests continue to improve as we treat her cholesterol. I believe this is a sign of fatty liver disease. Her cholesterol remains excellent as indicated by the blood work below.  Fortunately her fasting blood sugar also continues to improve. Bili concern the patient has is thick dystrophic and yellowed toenail developing on her medial left nailfold of the great toe. It appears to be onychomycosis. Past Medical History  Diagnosis Date  . GERD (gastroesophageal reflux disease)   . Hiatal hernia   . Osteopenia   . History of migraine   . Headache(784.0)     stress, sinus headaches  . Multiple environmental allergies   . History of colon polyps   . History of esophageal stricture     has had esophageal dilation x 1  . Palpitations     occasional; worse with ingestion of caffeine; no cardiologist  . Depression     no current meds.  . Dental crowns present   . Rash 01/06/2012    right elbow  . Rhinitis, allergic     rhinitis  . Hiatal hernia   . Chronic headaches   . Osteopenia   . 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  . Breast cancer, right     ER 0 PR0 LN neg.  . History of radiation therapy 03/28/2012-05/17/2012    60.4 gray to right breast  . Cancer   . Hyperlipidemia    Current Outpatient Prescriptions on File Prior to Visit  Medication Sig Dispense Refill  . atorvastatin (LIPITOR) 20 MG tablet Take 1 tablet (20 mg total) by mouth at bedtime.  90 tablet  3  . buPROPion (WELLBUTRIN XL) 150 MG 24 hr tablet Take 1 tablet (150 mg total) by mouth daily.  90 tablet  3  . Calcium-Vitamin D (CALTRATE 600 PLUS-VIT D PO) Take by mouth.        . cetirizine (ZYRTEC) 10 MG chewable tablet  Chew 10 mg by mouth daily.      . Cholecalciferol (VITAMIN D-3 PO) Take 1,000 Units by mouth.       . EPINEPHrine (EPIPEN) 0.3 mg/0.3 mL SOAJ injection Inject 0.3 mLs (0.3 mg total) into the muscle once.  2 Device  2  . Multiple Vitamin (MULTIVITAMIN) tablet Take 1 tablet by mouth daily.      . Omega-3 Fatty Acids (FISH OIL PO) Take by mouth.      . pantoprazole (PROTONIX) 40 MG tablet Take 40 mg by mouth daily.      . tamoxifen (NOLVADEX) 20 MG tablet Take 1 tablet (20 mg total) by mouth daily.  90 tablet  1  . UNABLE TO FIND Rx: E4235- Silicone Brest Prosthesis, right (Quantity: 1) L8020- Mastectomy Form, Right (Quantity: 2) L8000- Mastectomy Bra (Quantity: 6) T6144- Post Mastectomy Garment (Quantity: 2) Dx: 174.9; Right mastectomy  1 each  0  . doxycycline (VIBRAMYCIN) 50 MG capsule Take 1 capsule (50 mg total) by mouth 2 (two) times daily.  180 capsule  3   No current facility-administered medications on file prior to visit.   Allergies  Allergen Reactions  . Macadamia Nut Oil Anaphylaxis  . Other Swelling    POPCORN:  SWELLING THROAT  . Peanut-Containing Drug Products Swelling and Other (See Comments)    THROAT TIGHTNESS; ALSO WALNUTS  . Tomato Swelling    SWELLING UVULA  . Adhesive [Tape] Other (See Comments)    BLISTERS  . Flour Other (See Comments)    SNEEZING  . Penicillins Hives  . Hydrocodone Hives  . Oxycodone   . Vicodin [Hydrocodone-Acetaminophen] Swelling    Pt had flushing, then itching and swelling of lips  . Eggs Or Egg-Derived Products Other (See Comments)    POSITIVE ON ALLERGY TEST, BUT EATS EGGS WITHOUT PROBLEM  . Soap Other (See Comments)    FRAGRANCES (SOAP/LOTION/PERFUME):  HEADACHE, RUNNY NOSE  . Tramadol Nausea And Vomiting   History   Social History  . Marital Status: Married    Spouse Name: N/A    Number of Children: 4  . Years of Education: N/A   Occupational History  .      Department manager for [employer]   Social History Main Topics   . Smoking status: Former Smoker -- 0.50 packs/day for 5 years    Types: Cigarettes    Quit date: 03/15/2000  . Smokeless tobacco: Never Used  . Alcohol Use: No  . Drug Use: No     Comment: quit smoking 11 years ago  . Sexual Activity: Yes    Birth Control/ Protection: Post-menopausal   Other Topics Concern  . Not on file   Social History Narrative  . No narrative on file      Review of Systems  All other systems reviewed and are negative.      Objective:   Physical Exam  Vitals reviewed. Neck: Neck supple. No JVD present. No thyromegaly present.  Cardiovascular: Normal rate, regular rhythm and normal heart sounds.   Pulmonary/Chest: Effort normal and breath sounds normal. No respiratory distress. She has no wheezes. She has no rales.  Abdominal: Soft. Bowel sounds are normal. She exhibits no distension. There is no tenderness. There is no rebound and no guarding.  Musculoskeletal: She exhibits no edema.          Assessment & Plan:  1. Onychomycosis Begin Lamisil 250 mg by mouth daily. Check LFTs every month patient is on therapy. LFTs worsen I would discontinue Lamisil. - terbinafine (LAMISIL) 250 MG tablet; Take 1 tablet (250 mg total) by mouth daily.  Dispense: 30 tablet; Refill: 2  2. HLD (hyperlipidemia) Cholesterol is excellent. Continue Lipitor but I will have the patient temporarily discontinue the Lipitor while we are treating the onychomycosis

## 2013-10-16 ENCOUNTER — Other Ambulatory Visit: Payer: BC Managed Care – PPO

## 2013-10-16 DIAGNOSIS — Z79899 Other long term (current) drug therapy: Secondary | ICD-10-CM

## 2013-10-16 DIAGNOSIS — D696 Thrombocytopenia, unspecified: Secondary | ICD-10-CM

## 2013-10-16 DIAGNOSIS — Z853 Personal history of malignant neoplasm of breast: Secondary | ICD-10-CM

## 2013-10-16 DIAGNOSIS — R748 Abnormal levels of other serum enzymes: Secondary | ICD-10-CM

## 2013-10-17 LAB — HEPATIC FUNCTION PANEL
ALBUMIN: 4 g/dL (ref 3.5–5.2)
ALT: 49 U/L — ABNORMAL HIGH (ref 0–35)
AST: 45 U/L — ABNORMAL HIGH (ref 0–37)
Alkaline Phosphatase: 90 U/L (ref 39–117)
Bilirubin, Direct: 0.1 mg/dL (ref 0.0–0.3)
Indirect Bilirubin: 0.3 mg/dL (ref 0.2–1.2)
Total Bilirubin: 0.4 mg/dL (ref 0.2–1.2)
Total Protein: 6.5 g/dL (ref 6.0–8.3)

## 2013-10-23 ENCOUNTER — Ambulatory Visit: Payer: BC Managed Care – PPO | Admitting: Family Medicine

## 2013-10-25 ENCOUNTER — Telehealth: Payer: Self-pay

## 2013-10-25 NOTE — Telephone Encounter (Signed)
Rcvd fax from [facility] re: interactino tamoxifen and bupropion.  Copy to [place].  Original to scan.

## 2013-11-26 ENCOUNTER — Ambulatory Visit (INDEPENDENT_AMBULATORY_CARE_PROVIDER_SITE_OTHER): Payer: BC Managed Care – PPO | Admitting: *Deleted

## 2013-11-26 DIAGNOSIS — Z23 Encounter for immunization: Secondary | ICD-10-CM

## 2013-12-05 ENCOUNTER — Ambulatory Visit
Admission: RE | Admit: 2013-12-05 | Discharge: 2013-12-05 | Disposition: A | Discharge status: Home / Self Care | Payer: BC Managed Care – PPO | Source: Ambulatory Visit | Attending: Physician Assistant | Admitting: Physician Assistant

## 2013-12-05 ENCOUNTER — Encounter (INDEPENDENT_AMBULATORY_CARE_PROVIDER_SITE_OTHER): Payer: Self-pay

## 2013-12-05 DIAGNOSIS — Z853 Personal history of malignant neoplasm of breast: Secondary | ICD-10-CM

## 2013-12-18 ENCOUNTER — Other Ambulatory Visit: Payer: Self-pay | Admitting: Family Medicine

## 2013-12-25 ENCOUNTER — Other Ambulatory Visit: Payer: Self-pay | Admitting: Oncology

## 2013-12-25 ENCOUNTER — Other Ambulatory Visit (HOSPITAL_BASED_OUTPATIENT_CLINIC_OR_DEPARTMENT_OTHER): Payer: BC Managed Care – PPO

## 2013-12-25 ENCOUNTER — Other Ambulatory Visit: Payer: Self-pay | Admitting: *Deleted

## 2013-12-25 DIAGNOSIS — C50312 Malignant neoplasm of lower-inner quadrant of left female breast: Secondary | ICD-10-CM

## 2013-12-25 DIAGNOSIS — C50319 Malignant neoplasm of lower-inner quadrant of unspecified female breast: Secondary | ICD-10-CM

## 2013-12-25 DIAGNOSIS — C50919 Malignant neoplasm of unspecified site of unspecified female breast: Secondary | ICD-10-CM

## 2013-12-25 LAB — COMPREHENSIVE METABOLIC PANEL (CC13)
ALBUMIN: 3.5 g/dL (ref 3.5–5.0)
ALK PHOS: 93 U/L (ref 40–150)
ALT: 44 U/L (ref 0–55)
AST: 33 U/L (ref 5–34)
Anion Gap: 10 mEq/L (ref 3–11)
BUN: 11.3 mg/dL (ref 7.0–26.0)
CALCIUM: 9.4 mg/dL (ref 8.4–10.4)
CO2: 22 mEq/L (ref 22–29)
Chloride: 109 mEq/L (ref 98–109)
Creatinine: 1 mg/dL (ref 0.6–1.1)
Glucose: 96 mg/dl (ref 70–140)
POTASSIUM: 4 meq/L (ref 3.5–5.1)
Sodium: 142 mEq/L (ref 136–145)
Total Bilirubin: 0.44 mg/dL (ref 0.20–1.20)
Total Protein: 7.2 g/dL (ref 6.4–8.3)

## 2013-12-25 LAB — LIPID PANEL
Cholesterol: 192 mg/dL (ref 0–200)
HDL: 42 mg/dL (ref >39)
LDL CALC: 105 mg/dL — AB (ref 0–99)
TRIGLYCERIDES: 225 mg/dL — AB (ref <150)
Total CHOL/HDL Ratio: 4.6 Ratio
VLDL: 45 mg/dL — AB (ref 0–40)

## 2013-12-25 LAB — CBC WITH DIFFERENTIAL/PLATELET
BASO%: 1 % (ref 0.0–2.0)
Basophils Absolute: 0 10*3/uL (ref 0.0–0.1)
EOS%: 6.1 % (ref 0.0–7.0)
Eosinophils Absolute: 0.3 10*3/uL (ref 0.0–0.5)
HCT: 39.9 % (ref 34.8–46.6)
HEMOGLOBIN: 13.2 g/dL (ref 11.6–15.9)
LYMPH#: 1 10*3/uL (ref 0.9–3.3)
LYMPH%: 24.3 % (ref 14.0–49.7)
MCH: 31.9 pg (ref 25.1–34.0)
MCHC: 33.1 g/dL (ref 31.5–36.0)
MCV: 96.3 fL (ref 79.5–101.0)
MONO#: 0.4 10*3/uL (ref 0.1–0.9)
MONO%: 9.4 % (ref 0.0–14.0)
NEUT#: 2.4 10*3/uL (ref 1.5–6.5)
NEUT%: 59.2 % (ref 38.4–76.8)
Platelets: 100 10*3/uL — ABNORMAL LOW (ref 145–400)
RBC: 4.15 10*6/uL (ref 3.70–5.45)
RDW: 14.5 % (ref 11.2–14.5)
WBC: 4.1 10*3/uL (ref 3.9–10.3)

## 2014-01-01 ENCOUNTER — Ambulatory Visit (HOSPITAL_BASED_OUTPATIENT_CLINIC_OR_DEPARTMENT_OTHER): Payer: BC Managed Care – PPO | Admitting: Oncology

## 2014-01-01 ENCOUNTER — Telehealth: Payer: Self-pay | Admitting: Oncology

## 2014-01-01 VITALS — BP 121/62 | HR 74 | Temp 98.7°F | Resp 18 | Ht 64.5 in | Wt 232.9 lb

## 2014-01-01 DIAGNOSIS — M8580 Other specified disorders of bone density and structure, unspecified site: Secondary | ICD-10-CM

## 2014-01-01 DIAGNOSIS — C50312 Malignant neoplasm of lower-inner quadrant of left female breast: Secondary | ICD-10-CM

## 2014-01-01 DIAGNOSIS — Z6839 Body mass index (BMI) 39.0-39.9, adult: Secondary | ICD-10-CM

## 2014-01-01 DIAGNOSIS — Z171 Estrogen receptor negative status [ER-]: Secondary | ICD-10-CM

## 2014-01-01 NOTE — Telephone Encounter (Signed)
per pof to sch pt appt-gave pt copy of sch °

## 2014-01-01 NOTE — Progress Notes (Signed)
ID: SAAVI MCEACHRON   DOB: 1955-12-12  MR#: [id number]  HOZ#:[id number]  PCP: Odette Fraction, MD GYN: Thomes Dinning MD SU: Fanny Skates MD OTHER MD: Gery Pray, MD;  Lucio Edward. MD;  Jerrell Belfast, MD    CHIEF COMPLAINT:  Right Breast Cancer  CURRENT TREATMENT: Tamoxifen   HISTORY OF PRESENT ILLNESS: From the original intake note:  Amanda Hoover had routine bilateral screening mammography 12/02/2011 showing heterogeneously dense breasts. New calcifications were noted in the right breast, and were further evaluated with right diagnostic mammography 12/10/2011. This confirmed a group of pleomorphic microcalcifications in the lower inner quadrant of the right breast. Biopsy of this area was obtained 12/17/2011, and showed ([id number]) ductal carcinoma in situ, with areas suspicious for microinvasion, the in situ component being high-grade, estrogen and progesterone receptor negative. On 12/24/2011 the patient underwent bilateral breast MRI. This showed post biopsy changes in the right lower inner quadrant, associated with an area of clumped nodular enhancement measuring up to 3.7 cm.  The patient's subsequent history is as detailed below.  INTERVAL HISTORY: Amanda Hoover returns today for followup of her breast cancer. She went back on tamoxifen may 2015. She is tolerating it much better. She still has nighttime hot flashes, but really wake her up once or twice, where as before it used to be every hour. She did try the gabapentin, but it totally confused her and she doesn't want to try a lower dose. She's had no other side effects related to the tamoxifen.  REVIEW OF SYSTEMS: Amanda Hoover works full-time and of course she also helps her mother, 31 years old, lives 1 mile away. Charles father died 2 years ago from esophageal cancer and her mother need significant support. This means Amanda Hoover is not exercising regularly. Aside from that a detailed review of systems today was entirely  noncontributory   PAST MEDICAL HISTORY: Past Medical History  Diagnosis Date  . GERD (gastroesophageal reflux disease)   . Hiatal hernia   . Osteopenia   . History of migraine   . Headache(784.0)     stress, sinus headaches  . Multiple environmental allergies   . History of colon polyps   . History of esophageal stricture     has had esophageal dilation x 1  . Palpitations     occasional; worse with ingestion of caffeine; no cardiologist  . Depression     no current meds.  . Dental crowns present   . Rash 01/06/2012    right elbow  . Rhinitis, allergic     rhinitis  . Hiatal hernia   . Chronic headaches   . Osteopenia   . 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  . Breast cancer, right     ER 0 PR0 LN neg.  . History of radiation therapy 03/28/2012-05/17/2012    60.4 gray to right breast  . Cancer   . Hyperlipidemia     PAST SURGICAL HISTORY: Past Surgical History  Procedure Laterality Date  . Colonoscopy w/ polypectomy    . Partial mastectomy with needle localization and axillary sentinel lymph node bx  01/12/2012    Procedure: PARTIAL MASTECTOMY WITH NEEDLE LOCALIZATION AND AXILLARY SENTINEL LYMPH NODE BX;  Surgeon: Adin Hector, MD;  Location: [facility];  Service: General;  Laterality: Right;  . Esophagogastroduodenoscopy    . Re-excision of breast cancer,superior margins  02/15/2012    Procedure: RE-EXCISION OF BREAST CANCER,SUPERIOR MARGINS;  Surgeon: Adin Hector, MD;  Location: [facility];  Service: General;  Laterality: Right;  Re-excision of right lumpectomy, close posterior margin.    FAMILY HISTORY Family History  Problem Relation Age of Onset  . Breast cancer    . Liver disease    . Crohn's disease Mother   . Irritable bowel syndrome Mother   . Esophageal cancer Father 66  . Breast cancer Paternal Aunt     dx in her early 53s  . Diabetes Maternal Grandmother   . Breast cancer  Paternal Grandmother 51  . Diabetes Paternal Grandmother   . Cancer Maternal Uncle     unknown form of cancer  . Melanoma Cousin     maternal cousin dx in her 57s  [name] died of cancer of the esophagus at age 28. He had been diagnosed with this shortly before. [name] is currently 30 years old (as of October 2013). [name] was diagnosed with breast cancer at the age of 44. [name] was also diagnosed with breast cancer, at the age of 59. [name] (her paternal grandmothers only sister) was diagnosed with breast cancer in her 30s. The patient had one brother who died from a viral illness at the age of 1. There is no history of ovarian cancer in the family.  GYNECOLOGIC HISTORY:  (Reviewed 07/26/2013)  Menarche age 10, last menstrual period at age 73. The patient took hormone replacement for approximately one year. She is GX P4, first live birth age 32  SOCIAL HISTORY: (updated  07/26/2013 ) Amanda Hoover is working in [employer]  at [employer] currently, primarily [employer] . Her husband Marguerite Olea works in the [employer]. Son Amanda Hoover is an Clinical biochemist and [place]. Son Amanda Hoover lives in [place] where he works as a Restaurant manager, fast food. Daughter Amanda Hoover works as a Scientist, clinical (histocompatibility and immunogenetics) in [employer]. The patient has 4 grandchildren. She attends a [employer].   ADVANCED DIRECTIVES: in place  HEALTH MAINTENANCE: (Updated 07/26/2013) History  Substance Use Topics  . Smoking status: Former Smoker -- 0.50 packs/day for 5 years    Types: Cigarettes    Quit date: 03/15/2000  . Smokeless tobacco: Never Used  . Alcohol Use: No     Colonoscopy: 2013/Dr. Fuller Plan  PAP: 2012/ Dr. Pamala Hurry  Bone density: 01/23/2013, osteopenia, T score is -1.7  Lipid panel: December 2014/ Dr. Dennard Schaumann    Allergies  Allergen Reactions  . Macadamia Nut Oil Anaphylaxis  . Other Swelling    POPCORN:  SWELLING  THROAT  . Peanut-Containing Drug Products Swelling and Other (See Comments)    THROAT TIGHTNESS; ALSO WALNUTS  . Tomato Swelling    SWELLING UVULA  . Adhesive [Tape] Other (See Comments)    BLISTERS  . Flour Other (See Comments)    SNEEZING  . Penicillins Hives  . Hydrocodone Hives  . Oxycodone   . Vicodin [Hydrocodone-Acetaminophen] Swelling    Pt had flushing, then itching and swelling of lips  . Eggs Or Egg-Derived Products Other (See Comments)    POSITIVE ON ALLERGY TEST, BUT EATS EGGS WITHOUT PROBLEM  . Soap Other (See Comments)    FRAGRANCES (SOAP/LOTION/PERFUME):  HEADACHE, RUNNY NOSE  . Tramadol Nausea And Vomiting    Current Outpatient Prescriptions  Medication Sig Dispense Refill  . atorvastatin (LIPITOR) 20 MG tablet Take 1 tablet (20 mg total) by mouth at bedtime.  90 tablet  3  . buPROPion (WELLBUTRIN XL) 150 MG 24 hr tablet Take 1  tablet (150 mg total) by mouth daily.  90 tablet  3  . Calcium-Vitamin D (CALTRATE 600 PLUS-VIT D PO) Take by mouth.        . cetirizine (ZYRTEC) 10 MG chewable tablet Chew 10 mg by mouth daily.      . Cholecalciferol (VITAMIN D-3 PO) Take 1,000 Units by mouth.       . doxycycline (VIBRAMYCIN) 50 MG capsule Take 1 capsule (50 mg total) by mouth 2 (two) times daily.  180 capsule  3  . EPINEPHrine (EPIPEN) 0.3 mg/0.3 mL SOAJ injection Inject 0.3 mLs (0.3 mg total) into the muscle once.  2 Device  2  . Multiple Vitamin (MULTIVITAMIN) tablet Take 1 tablet by mouth daily.      . Omega-3 Fatty Acids (FISH OIL PO) Take by mouth.      . pantoprazole (PROTONIX) 40 MG tablet Take 40 mg by mouth daily.      . tamoxifen (NOLVADEX) 20 MG tablet Take 1 tablet (20 mg total) by mouth daily.  90 tablet  1  . terbinafine (LAMISIL) 250 MG tablet Take 1 tablet (250 mg total) by mouth daily.  30 tablet  2  . UNABLE TO FIND Rx: J3354- Silicone Brest Prosthesis, right (Quantity: 1) L8020- Mastectomy Form, Right (Quantity: 2) L8000- Mastectomy Bra (Quantity:  6) T6256- Post Mastectomy Garment (Quantity: 2) Dx: 174.9; Right mastectomy  1 each  0   No current facility-administered medications for this visit.    OBJECTIVE: Middle-aged white woman in no acute distress Filed Vitals:   01/01/14 1007  BP: 121/62  Pulse: 74  Temp: 98.7 F (37.1 C)  Resp: 18     Body mass index is 39.37 kg/(m^2).    ECOG FS: 0 Filed Weights   01/01/14 1007  Weight: 232 lb 14.4 oz (105.643 kg)   Sclerae unicteric, pupils equal and reactive Oropharynx clear and moist No cervical or supraclavicular adenopathy Lungs no rales or rhonchi Heart regular rate and rhythm Abd soft, nontender, positive bowel sounds MSK no focal spinal tenderness, no upper extremity lymphedema Neuro: nonfocal, well oriented, appropriate affect Breasts: The right breast is status post lumpectomy and radiation. There is no evidence of local recurrence. The right axilla is benign. Left breast is unremarkable.   LAB RESULTS: Lab Results  Component Value Date   WBC 4.1 12/25/2013   NEUTROABS 2.4 12/25/2013   HGB 13.2 12/25/2013   HCT 39.9 12/25/2013   MCV 96.3 12/25/2013   PLT 100* 12/25/2013      Chemistry      Component Value Date/Time   NA 142 12/25/2013 0820   NA 141 09/18/2013 1053   NA 138 08/30/2008   K 4.0 12/25/2013 0820   K 4.5 09/18/2013 1053   CL 105 09/18/2013 1053   CL 106 12/29/2011 1304   CO2 22 12/25/2013 0820   CO2 24 09/18/2013 1053   BUN 11.3 12/25/2013 0820   BUN 15 09/18/2013 1053   BUN 12 08/30/2008   CREATININE 1.0 12/25/2013 0820   CREATININE 0.94 09/18/2013 1053   CREATININE 0.93 02/21/2009 0605   CREATININE 1.0 08/30/2008   GLU 99 08/30/2008      Component Value Date/Time   CALCIUM 9.4 12/25/2013 0820   CALCIUM 9.3 09/18/2013 1053   ALKPHOS 93 12/25/2013 0820   ALKPHOS 90 10/16/2013 1541   AST 33 12/25/2013 0820   AST 45* 10/16/2013 1541   ALT 44 12/25/2013 0820   ALT 49* 10/16/2013 1541   BILITOT 0.44 12/25/2013  0820   BILITOT 0.4 10/16/2013 1541       STUDIES:  CLINICAL DATA: History of right lumpectomy in 2013. One year  followup for probably benign calcifications in the right breast.  EXAM:  DIGITAL DIAGNOSTIC BILATERAL MAMMOGRAM WITH 3D TOMOSYNTHESIS AND CAD  COMPARISON: 06/04/2013 and earlier, including 2009  ACR Breast Density Category b: There are scattered areas of  fibroglandular density.  FINDINGS:  Postoperative changes are seen in the right breast. Magnified views  are performed of calcifications in the right lumpectomy site. There  are scattered dystrophic appearing calcifications, associated with  changes of fat necrosis. No suspicious morphology or distribution of  calcifications identified.  Magnified views are also performed of retroareolar left breast  calcifications. These appear rounded and punctate, stable in appears  over multiple prior studies. No suspicious morphology or  distribution of calcifications identified.  Mammographic images were processed with CAD.  IMPRESSION:  1. Expected posttreatment changes on the right.  2. Benign-appearing calcifications bilaterally.  RECOMMENDATION:  Bilateral diagnostic mammogram is recommended in 1 year. Magnified  views of right breast calcifications are recommended at that time.  I have discussed the findings and recommendations with the patient.  Results were also provided in writing at the conclusion of the  visit. If applicable, a reminder letter will be sent to the patient  regarding the next appointment.  BI-RADS CATEGORY 3: Probably benign.  Electronically Signed  By: Shon Hale M.D.  On: 12/05/2013 09:48     ASSESSMENT: 58 y.o.  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 Restarting tamoxifen in May 2015.   (5)  osteopenia, T score -1.7 in November 2014   PLAN:  Amanda Hoover is taking the tamoxifen chiefly for preventive purposes. She is now tolerating it better. I think it is in her interest to take it for a total of 5 years, if possible, but if she develops significant side effects it would be fine to simply observe.  We discussed the common symptoms of menopause which include weight gain, insomnia, sooner bones, mood changes, and vaginal dryness, as well as hot flashes. Tamoxifen worsens the hot flashes but actually causes vaginal wetness and it strengthens the bones.  Amanda Hoover's main concern is her continuing weight gain. I gave her a copy of the [employer] pamphlet and suggested she exercises on a 45 minute basis 5 times a week. She is planning to enter into a diet that's fine but is not going to be really feasible for her to lose any weight unless she exercises as well.  At this point I am comfortable seeing her on a once a year basis. She will have her next mammogram next September and I will see her next October. She knows to call for any problems that may develop before next visit here.  Chauncey Cruel, MD      01/01/2014

## 2014-01-01 NOTE — Progress Notes (Signed)
Thrombocytopenia-- mild-- stable  Results for Amanda Hoover, Amanda Hoover (MRN [id number]) as of 01/01/2014 11:00  Ref. Range 04/19/2013 08:37 06/11/2013 08:37 07/26/2013 13:26 09/18/2013 10:53 12/25/2013 08:20  Platelets Latest Range: 150-400 K/uL 123 (L) 122 (L) 129 (L) 124 (L) 100 (L)

## 2014-01-09 ENCOUNTER — Other Ambulatory Visit: Payer: Self-pay | Admitting: Family Medicine

## 2014-01-09 ENCOUNTER — Other Ambulatory Visit: Payer: Self-pay | Admitting: *Deleted

## 2014-01-09 DIAGNOSIS — C50311 Malignant neoplasm of lower-inner quadrant of right female breast: Secondary | ICD-10-CM

## 2014-01-09 MED ORDER — PANTOPRAZOLE SODIUM 40 MG PO TBEC: 40.0000 mg | DELAYED_RELEASE_TABLET | Freq: Every day | ORAL | Status: DC

## 2014-01-09 MED ORDER — TAMOXIFEN CITRATE 20 MG PO TABS: 20.0000 mg | ORAL_TABLET | Freq: Every day | ORAL | Status: DC

## 2014-01-09 NOTE — Telephone Encounter (Signed)
Med sent to pharm 

## 2014-01-10 ENCOUNTER — Other Ambulatory Visit: Payer: Self-pay | Admitting: *Deleted

## 2014-01-10 MED ORDER — PANTOPRAZOLE SODIUM 40 MG PO TBEC: 40.0000 mg | DELAYED_RELEASE_TABLET | Freq: Every day | ORAL | Status: DC

## 2014-01-10 NOTE — Telephone Encounter (Signed)
Received fax requesting refill on Protonix.   Refill appropriate and filled per protocol.

## 2014-02-14 ENCOUNTER — Encounter: Payer: Self-pay | Admitting: Family Medicine

## 2014-02-14 ENCOUNTER — Ambulatory Visit (INDEPENDENT_AMBULATORY_CARE_PROVIDER_SITE_OTHER): Payer: BC Managed Care – PPO | Admitting: Family Medicine

## 2014-02-14 VITALS — BP 126/72 | HR 88 | Temp 98.5°F | Resp 18 | Ht 64.25 in | Wt 234.0 lb

## 2014-02-14 DIAGNOSIS — B351 Tinea unguium: Secondary | ICD-10-CM

## 2014-02-14 DIAGNOSIS — L209 Atopic dermatitis, unspecified: Secondary | ICD-10-CM

## 2014-02-14 MED ORDER — MOMETASONE FUROATE 0.1 % EX OINT: TOPICAL_OINTMENT | Freq: Every day | CUTANEOUS | Status: DC

## 2014-02-14 MED ORDER — TERBINAFINE HCL 250 MG PO TABS: 250.0000 mg | ORAL_TABLET | Freq: Every day | ORAL | Status: DC

## 2014-02-14 NOTE — Progress Notes (Signed)
Subjective:    Patient ID: Amanda Hoover, female    DOB: February 21, 1956, 58 y.o.   MRN: [id number]  HPI  I saw the patient in July for onychomycosis of the left medial great toenail. She took 3 months of Lamisil and noticed significant improvement but not complete resolution in the thick yellow dystrophic nail. She would like to take it for an additional month to 2 months to try to ensure complete resolution. She also has a cluster of papules on the dorsal surface of her left elbow. There are approximately 5-6 erythematous red papules with excoriations in a cluster. There are no vesicles. There is no erythema. There is no evidence of herpes. This appears to be mild atopic dermatitis as there are some lichenification is to the skin around the area. Past Medical History  Diagnosis Date  . GERD (gastroesophageal reflux disease)   . Hiatal hernia   . Osteopenia   . History of migraine   . Headache(784.0)     stress, sinus headaches  . Multiple environmental allergies   . History of colon polyps   . History of esophageal stricture     has had esophageal dilation x 1  . Palpitations     occasional; worse with ingestion of caffeine; no cardiologist  . Depression     no current meds.  . Dental crowns present   . Rash 01/06/2012    right elbow  . Rhinitis, allergic     rhinitis  . Hiatal hernia   . Chronic headaches   . Osteopenia   . 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  . Breast cancer, right     ER 0 PR0 LN neg.  . History of radiation therapy 03/28/2012-05/17/2012    60.4 gray to right breast  . Cancer   . Hyperlipidemia    Past Surgical History  Procedure Laterality Date  . Colonoscopy w/ polypectomy    . Partial mastectomy with needle localization and axillary sentinel lymph node bx  01/12/2012    Procedure: PARTIAL MASTECTOMY WITH NEEDLE LOCALIZATION AND AXILLARY SENTINEL LYMPH NODE BX;  Surgeon: Adin Hector, MD;  Location: [place];  Service: General;  Laterality: Right;  . Esophagogastroduodenoscopy    . Re-excision of breast cancer,superior margins  02/15/2012    Procedure: RE-EXCISION OF BREAST CANCER,SUPERIOR MARGINS;  Surgeon: Adin Hector, MD;  Location: [place];  Service: General;  Laterality: Right;  Re-excision of right lumpectomy, close posterior margin.   Current Outpatient Prescriptions on File Prior to Visit  Medication Sig Dispense Refill  . buPROPion (WELLBUTRIN XL) 150 MG 24 hr tablet Take 1 tablet (150 mg total) by mouth daily. 90 tablet 3  . Calcium-Vitamin D (CALTRATE 600 PLUS-VIT D PO) Take by mouth.      . cetirizine (ZYRTEC) 10 MG chewable tablet Chew 10 mg by mouth daily.    . Cholecalciferol (VITAMIN D-3 PO) Take 1,000 Units by mouth.     . EPINEPHrine (EPIPEN) 0.3 mg/0.3 mL SOAJ injection Inject 0.3 mLs (0.3 mg total) into the muscle once. 2 Device 2  . Multiple Vitamin (MULTIVITAMIN) tablet Take 1 tablet by mouth daily.    . Omega-3 Fatty Acids (FISH OIL PO) Take by mouth.    . pantoprazole (PROTONIX) 40 MG tablet Take 1 tablet (40 mg total) by mouth daily. 90 tablet 3  . tamoxifen (NOLVADEX) 20 MG tablet Take 1 tablet (20 mg total) by mouth daily. [place]  tablet 3   No current facility-administered medications on file prior to visit.   Allergies  Allergen Reactions  . Macadamia Nut Oil Anaphylaxis  . Other Swelling    POPCORN:  SWELLING THROAT  . Peanut-Containing Drug Products Swelling and Other (See Comments)    THROAT TIGHTNESS; ALSO WALNUTS  . Tomato Swelling    SWELLING UVULA  . Adhesive [Tape] Other (See Comments)    BLISTERS  . Flour Other (See Comments)    SNEEZING  . Penicillins Hives  . Hydrocodone Hives  . Oxycodone   . Vicodin [Hydrocodone-Acetaminophen] Swelling    Pt had flushing, then itching and swelling of lips  . Eggs Or Egg-Derived Products Other (See Comments)    POSITIVE ON ALLERGY TEST, BUT EATS EGGS WITHOUT PROBLEM  .  Soap Other (See Comments)    FRAGRANCES (SOAP/LOTION/PERFUME):  HEADACHE, RUNNY NOSE  . Tramadol Nausea And Vomiting   History   Social History  . Marital Status: Married    Spouse Name: N/A    Number of Children: 4  . Years of Education: N/A   Occupational History  .      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 Smoking status: Former Smoker -- 0.50 packs/day for 5 years    Types: Cigarettes    Quit date: 03/15/2000  . Smokeless tobacco: Never Used  . Alcohol Use: No  . Drug Use: No     Comment: quit smoking 11 years ago  . Sexual Activity: Yes    Birth Control/ Protection: Post-menopausal   Other Topics Concern  . Not on file   Social History Narrative     Review of Systems  All other systems reviewed and are negative.      Objective:   Physical Exam  Cardiovascular: Normal rate, regular rhythm and normal heart sounds.   Pulmonary/Chest: Effort normal and breath sounds normal.  Abdominal: Soft. Bowel sounds are normal. She exhibits no distension. There is no tenderness. There is no rebound.  Skin: Rash noted.  Vitals reviewed.         Assessment & Plan:  Onychomycosis of toenail - Plan: terbinafine (LAMISIL) 250 MG tablet  Atopic dermatitis - Plan: mometasone (ELOCON) 0.1 % ointment  I will treat the patient with 2 additional months of Lamisil. I recommended that we check liver function test every month that she takes the medication. I'll also treat atopic dermatitis on her left elbow with Elocon ointment applied daily for 10-14 days. Recommend biopsy if the lesion persists. The patient also reports some mild problems with focus and concentration. She's had problems with attention deficit ever since she was a child. However recently due to stress and responsibilities this seems to be worse. However at the present time she is not interested in taking medication for this.

## 2014-02-17 ENCOUNTER — Ambulatory Visit (INDEPENDENT_AMBULATORY_CARE_PROVIDER_SITE_OTHER): Payer: BC Managed Care – PPO | Admitting: Physician Assistant

## 2014-02-17 VITALS — BP 128/74 | HR 74 | Temp 98.1°F | Resp 19 | Ht 64.5 in | Wt 233.0 lb

## 2014-02-17 DIAGNOSIS — J069 Acute upper respiratory infection, unspecified: Secondary | ICD-10-CM

## 2014-02-17 DIAGNOSIS — J029 Acute pharyngitis, unspecified: Secondary | ICD-10-CM

## 2014-02-17 LAB — POCT RAPID STREP A (OFFICE): RAPID STREP A SCREEN: NEGATIVE

## 2014-02-17 MED ORDER — IPRATROPIUM BROMIDE 0.03 % NA SOLN: 2.0000 | Freq: Two times a day (BID) | NASAL | Status: DC

## 2014-02-17 NOTE — Progress Notes (Signed)
The patient was discussed with me and I agree with the diagnosis and treatment plan.  

## 2014-02-17 NOTE — Patient Instructions (Signed)
1. Continue over the counter guaifenesin and get plenty of fluids.  Upper Respiratory Infection, Adult An upper respiratory infection (URI) is also sometimes known as the common cold. The upper respiratory tract includes the nose, sinuses, throat, trachea, and bronchi. Bronchi are the airways leading to the lungs. Most people improve within 1 week, but symptoms can last up to 2 weeks. A residual cough may last even longer.  CAUSES Many different viruses can infect the tissues lining the upper respiratory tract. The tissues become irritated and inflamed and often become very moist. Mucus production is also common. A cold is contagious. You can easily spread the virus to others by oral contact. This includes kissing, sharing a glass, coughing, or sneezing. Touching your mouth or nose and then touching a surface, which is then touched by another person, can also spread the virus. SYMPTOMS  Symptoms typically develop 1 to 3 days after you come in contact with a cold virus. Symptoms vary from person to person. They may include:  Runny nose.  Sneezing.  Nasal congestion.  Sinus irritation.  Sore throat.  Loss of voice (laryngitis).  Cough.  Fatigue.  Muscle aches.  Loss of appetite.  Headache.  Low-grade fever. DIAGNOSIS  You might diagnose your own cold based on familiar symptoms, since most people get a cold 2 to 3 times a year. Your caregiver can confirm this based on your exam. Most importantly, your caregiver can check that your symptoms are not due to another disease such as strep throat, sinusitis, pneumonia, asthma, or epiglottitis. Blood tests, throat tests, and X-rays are not necessary to diagnose a common cold, but they may sometimes be helpful in excluding other more serious diseases. Your caregiver will decide if any further tests are required. RISKS AND COMPLICATIONS  You may be at risk for a more severe case of the common cold if you smoke cigarettes, have chronic heart  disease (such as heart failure) or lung disease (such as asthma), or if you have a weakened immune system. The very young and very old are also at risk for more serious infections. Bacterial sinusitis, middle ear infections, and bacterial pneumonia can complicate the common cold. The common cold can worsen asthma and chronic obstructive pulmonary disease (COPD). Sometimes, these complications can require emergency medical care and may be life-threatening. PREVENTION  The best way to protect against getting a cold is to practice good hygiene. Avoid oral or hand contact with people with cold symptoms. Wash your hands often if contact occurs. There is no clear evidence that vitamin C, vitamin E, echinacea, or exercise reduces the chance of developing a cold. However, it is always recommended to get plenty of rest and practice good nutrition. TREATMENT  Treatment is directed at relieving symptoms. There is no cure. Antibiotics are not effective, because the infection is caused by a virus, not by bacteria. Treatment may include:  Increased fluid intake. Sports drinks offer valuable electrolytes, sugars, and fluids.  Breathing heated mist or steam (vaporizer or shower).  Eating chicken soup or other clear broths, and maintaining good nutrition.  Getting plenty of rest.  Using gargles or lozenges for comfort.  Controlling fevers with ibuprofen or acetaminophen as directed by your caregiver.  Increasing usage of your inhaler if you have asthma. Zinc gel and zinc lozenges, taken in the first 24 hours of the common cold, can shorten the duration and lessen the severity of symptoms. Pain medicines may help with fever, muscle aches, and throat pain. A variety of  non-prescription medicines are available to treat congestion and runny nose. Your caregiver can make recommendations and may suggest nasal or lung inhalers for other symptoms.  HOME CARE INSTRUCTIONS   Only take over-the-counter or prescription  medicines for pain, discomfort, or fever as directed by your caregiver.  Use a warm mist humidifier or inhale steam from a shower to increase air moisture. This may keep secretions moist and make it easier to breathe.  Drink enough water and fluids to keep your urine clear or pale yellow.  Rest as needed.  Return to work when your temperature has returned to normal or as your caregiver advises. You may need to stay home longer to avoid infecting others. You can also use a face mask and careful hand washing to prevent spread of the virus. SEEK MEDICAL CARE IF:   After the first few days, you feel you are getting worse rather than better.  You need your caregiver's advice about medicines to control symptoms.  You develop chills, worsening shortness of breath, or brown or red sputum. These may be signs of pneumonia.  You develop yellow or brown nasal discharge or pain in the face, especially when you bend forward. These may be signs of sinusitis.  You develop a fever, swollen neck glands, pain with swallowing, or white areas in the back of your throat. These may be signs of strep throat. SEEK IMMEDIATE MEDICAL CARE IF:   You have a fever.  You develop severe or persistent headache, ear pain, sinus pain, or chest pain.  You develop wheezing, a prolonged cough, cough up blood, or have a change in your usual mucus (if you have chronic lung disease).  You develop sore muscles or a stiff neck. Document Released: 08/25/2000 Document Revised: 05/24/2011 Document Reviewed: 06/06/2013 [facility] Patient Information 2015 [place], [place]. This information is not intended to replace advice given to you by your health care provider. Make sure you discuss any questions you have with your health care provider.

## 2014-02-17 NOTE — Progress Notes (Signed)
Subjective:    Patient ID: Amanda Hoover, female    DOB: 01/07/1956, 58 y.o.   MRN: [id number]  HPI Patient presents for sore throat that has been present for 4 days that is getting progressively worse and is now accompanied by productive cough, sinus and ear pressure, and congestion. Has had some rhinorrhea, but denies fever, eye itching/pain, or N/V. Only came in because granddaughter had strep throat and woke up with red, swollen base of neck which she was not sure if it was related to her granddaughter or her accidental exposure to peanuts last night which she is allergic. Swelling and redness went away after taking Benadryl. Has been taking OTC cough medication which have given some relief. No h/o asthma and has many allergies.    Review of Systems  Constitutional: Negative for fever, chills, diaphoresis, activity change and fatigue.  HENT: Positive for congestion, ear pain (pressure), rhinorrhea (mild), sinus pressure and sore throat. Negative for ear discharge, facial swelling, postnasal drip, sneezing and trouble swallowing.   Eyes: Negative for discharge and itching.  Respiratory: Positive for cough. Negative for chest tightness, shortness of breath and wheezing.   Cardiovascular: Negative for chest pain.  Gastrointestinal: Negative for nausea and vomiting.  Musculoskeletal: Negative for neck pain and neck stiffness.  Skin: Positive for rash (resolved).  Allergic/Immunologic: Positive for environmental allergies and food allergies.  Neurological: Positive for headaches. Negative for dizziness and light-headedness.  Hematological: Negative for adenopathy.       Objective:   Physical Exam  Constitutional: She is oriented to person, place, and time. She appears well-developed and well-nourished. No distress.  Blood pressure 128/74, pulse 74, temperature 98.1 F (36.7 C), temperature source Oral, resp. rate 19, height 5' 4.5" (1.638 m), weight 233 lb (105.688 kg), SpO2 97 %.     HENT:  Head: Normocephalic and atraumatic.  Right Ear: Tympanic membrane, external ear and ear canal normal.  Left Ear: Tympanic membrane, external ear and ear canal normal.  Nose: Mucosal edema and rhinorrhea present. No sinus tenderness. Right sinus exhibits no maxillary sinus tenderness and no frontal sinus tenderness. Left sinus exhibits no maxillary sinus tenderness and no frontal sinus tenderness.  Mouth/Throat: Oropharynx is clear and moist. No oropharyngeal exudate.  Eyes: Conjunctivae are normal. Right eye exhibits no discharge. Left eye exhibits no discharge. No scleral icterus.  Neck: Normal range of motion. Neck supple. No thyromegaly present.  Cardiovascular: Normal rate, regular rhythm and normal heart sounds.  Exam reveals no gallop and no friction rub.   No murmur heard. Pulmonary/Chest: Effort normal and breath sounds normal. No accessory muscle usage. No respiratory distress. She has no wheezes. She has no rhonchi. She has no rales. She exhibits no tenderness.  Lymphadenopathy:    She has cervical adenopathy (on right).  Neurological: She is alert and oriented to person, place, and time.  Skin: Skin is warm and dry. No rash noted. She is not diaphoretic. No erythema. No pallor.   Results for orders placed or performed in visit on 02/17/14  POCT rapid strep A  Result Value Ref Range   Rapid Strep A Screen Negative Negative      Assessment & Plan:  1. Sore throat 2. Acute upper respiratory infection - POCT rapid strep A - Culture, Group A Strep - ipratropium (ATROVENT) 0.03 % nasal spray; Place 2 sprays into both nostrils 2 (two) times daily.  Dispense: 30 mL; Refill: 0 - Continue OTC Mucinex and increase fluid intake. - Gave Mucinex  and Cepacol samples.   Alveta Heimlich PA-C  [facility] 02/17/2014 9:11 AM

## 2014-02-19 ENCOUNTER — Other Ambulatory Visit: Payer: Self-pay | Admitting: Physician Assistant

## 2014-02-19 ENCOUNTER — Telehealth: Payer: Self-pay

## 2014-02-19 DIAGNOSIS — J020 Streptococcal pharyngitis: Secondary | ICD-10-CM

## 2014-02-19 LAB — CULTURE, GROUP A STREP

## 2014-02-19 MED ORDER — CEFDINIR 300 MG PO CAPS: 600.0000 mg | ORAL_CAPSULE | Freq: Every day | ORAL | Status: DC

## 2014-02-19 NOTE — Telephone Encounter (Signed)
Error

## 2014-02-22 ENCOUNTER — Telehealth: Payer: Self-pay | Admitting: *Deleted

## 2014-02-22 MED ORDER — AZITHROMYCIN 250 MG PO TABS: ORAL_TABLET | ORAL | Status: DC

## 2014-02-22 NOTE — Telephone Encounter (Signed)
Ok with zpack 

## 2014-02-22 NOTE — Telephone Encounter (Signed)
zpak called in

## 2014-02-22 NOTE — Telephone Encounter (Signed)
Pt called stating she has a sinus infection, head congestion for over a week has been taking mucinex for over a week and wants to know if you can call something in for her, states would like to have some cough medicine as well, one she is using is not working  For her. pLease advise.  [facility]

## 2014-03-05 ENCOUNTER — Telehealth: Payer: Self-pay

## 2014-03-05 NOTE — Telephone Encounter (Signed)
Called patient at the request of Emmit Alexanders (Admission Services Specialist).  Patient states that she is concerned about the fact she received a prescription for Omnicef in the presence of her penicillin allergy.  After speaking to Windell Hummingbird, determined that cephalosporins are not an absolute contraindication for patients with penicillin allergies.  Explained this to patient.  Mrs. Stolarz states that she does not want cephalosporins listed as an allergy; however, she will begin requesting that prescribers avoid them.    In an effort to clarify information in previous phone encounters, patient did not take [place].  No reaction or hospital visit resulted from this prescription.    Apologized to patient for not meeting her expectations and thanked her for the opportunity to make things better for our patients.

## 2014-04-03 ENCOUNTER — Encounter: Payer: Self-pay | Admitting: Physician Assistant

## 2014-04-03 ENCOUNTER — Ambulatory Visit (INDEPENDENT_AMBULATORY_CARE_PROVIDER_SITE_OTHER): Payer: BLUE CROSS/BLUE SHIELD | Admitting: Physician Assistant

## 2014-04-03 VITALS — BP 114/70 | HR 68 | Temp 98.5°F | Resp 18 | Wt 232.0 lb

## 2014-04-03 DIAGNOSIS — J029 Acute pharyngitis, unspecified: Secondary | ICD-10-CM

## 2014-04-03 LAB — RAPID STREP SCREEN (MED CTR MEBANE ONLY): Streptococcus, Group A Screen (Direct): NEGATIVE

## 2014-04-03 NOTE — Progress Notes (Signed)
Patient ID: LAQUANNA VEAZEY MRN: [id number], DOB: Mar 24, 1955, 59 y.o. Date of Encounter: 04/03/2014, 8:27 AM    Chief Complaint:  Chief Complaint  Patient presents with  . recurrent sore throat    had strep last month     HPI: 59 y.o. year old white female states that the notes in [facility] are incorrect regarding her sore throat episode 02/17/2014.  Says that  she went and picked up the initial abx-- but then after reading paperwork--she realized that she was allergic to that abx--never took it--never took any abx until she called here and we Rxed Azithromycin.  Says that her sore throat and symptoms completely resolved.   Says these current symptoms have been present about    1 1/2 weeks---feels like throat just irritated b/c of some postnasal drainage----but she wanted to make sure---b/c husband currently sick and was positive for strep and also b/c she goes to see her ill mother in nursing home--does not want to spread infection there if she has infxn.   Pt says getting nothing from nose.  Gets small amount of clear drainage from throat. No deep chest congestion.  No really sore throat--just a little irritated.  No fever, chills.      Home Meds:   Outpatient Prescriptions Prior to Visit  Medication Sig Dispense Refill  . buPROPion (WELLBUTRIN XL) 150 MG 24 hr tablet Take 1 tablet (150 mg total) by mouth daily. 90 tablet 3  . cetirizine (ZYRTEC) 10 MG chewable tablet Chew 10 mg by mouth daily.    . Cholecalciferol (VITAMIN D-3 PO) Take 1,000 Units by mouth.     . EPINEPHrine (EPIPEN) 0.3 mg/0.3 mL SOAJ injection Inject 0.3 mLs (0.3 mg total) into the muscle once. 2 Device 2  . ipratropium (ATROVENT) 0.03 % nasal spray Place 2 sprays into both nostrils 2 (two) times daily. 30 mL 0  . mometasone (ELOCON) 0.1 % ointment Apply topically daily. 45 g 0  . Multiple Vitamin (MULTIVITAMIN) tablet Take 1 tablet by mouth daily.    . Omega-3 Fatty Acids (FISH OIL PO) Take by mouth.    .  pantoprazole (PROTONIX) 40 MG tablet Take 1 tablet (40 mg total) by mouth daily. 90 tablet 3  . tamoxifen (NOLVADEX) 20 MG tablet Take 1 tablet (20 mg total) by mouth daily. 90 tablet 3  . terbinafine (LAMISIL) 250 MG tablet Take 1 tablet (250 mg total) by mouth daily. 30 tablet 1  . Calcium-Vitamin D (CALTRATE 600 PLUS-VIT D PO) Take by mouth.      . cefdinir (OMNICEF) 300 MG capsule Take 2 capsules (600 mg total) by mouth daily. (Patient not taking: Reported on 04/03/2014) 20 capsule 0  . azithromycin (ZITHROMAX) 250 MG tablet Take one tablet every day 6 tablet 0   No facility-administered medications prior to visit.    Allergies:  Allergies  Allergen Reactions  . Macadamia Nut Oil Anaphylaxis  . Other Swelling    POPCORN:  SWELLING THROAT  . Peanut-Containing Drug Products Swelling and Other (See Comments)    THROAT TIGHTNESS; ALSO WALNUTS  . Tomato Swelling    SWELLING UVULA  . Adhesive [Tape] Other (See Comments)    BLISTERS  . Flour Other (See Comments)    SNEEZING  . Penicillins Hives  . Hydrocodone Hives  . Oxycodone   . Vicodin [Hydrocodone-Acetaminophen] Swelling    Pt had flushing, then itching and swelling of lips  . Eggs Or Egg-Derived Products Other (See Comments)  POSITIVE ON ALLERGY TEST, BUT EATS EGGS WITHOUT PROBLEM  . Soap Other (See Comments)    FRAGRANCES (SOAP/LOTION/PERFUME):  HEADACHE, RUNNY NOSE  . Tramadol Nausea And Vomiting      Review of Systems: See HPI for pertinent ROS. All other ROS negative.    Physical Exam: Blood pressure 114/70, pulse 68, temperature 98.5 F (36.9 C), temperature source Oral, resp. rate 18, weight 232 lb (105.235 kg)., Body mass index is 39.22 kg/(m^2). General:  WNWD WF. Appears in no acute distress. HEENT: Normocephalic, atraumatic, eyes without discharge, sclera non-icteric, nares are without discharge. Bilateral auditory canals clear, TM's are without perforation, pearly grey and translucent with reflective cone of  light bilaterally. Oral cavity moist, posterior pharynx without exudate, erythema, peritonsillar abscess.  Neck: Supple. No thyromegaly. No lymphadenopathy. Lungs: Clear bilaterally to auscultation without wheezes, rales, or rhonchi. Breathing is unlabored. Heart: Regular rhythm. No murmurs, rubs, or gallops. Msk:  Strength and tone normal for age. Extremities/Skin: Warm and dry.  No rashes. Neuro: Alert and oriented X 3. Moves all extremities spontaneously. Gait is normal. CNII-XII grossly in tact. Psych:  Responds to questions appropriately with a normal affect.      ASSESSMENT AND PLAN:  59 y.o. year old female with  1. Viral pharyngitis  2. Sorethroat - Rapid strep screen  Rapid Strep Test Negative. Will send culture.  Will f/u with pt as soon as get culture results.  In meantime, decongestant to dry drainage. Call us if symptoms worsen in interim.   Marin Olp [place], [place], [facility] 04/03/2014 8:27 AM

## 2014-04-06 LAB — CULTURE, GROUP A STREP: ORGANISM ID, BACTERIA: NORMAL

## 2014-04-13 ENCOUNTER — Other Ambulatory Visit: Payer: Self-pay | Admitting: Family Medicine

## 2014-04-22 ENCOUNTER — Other Ambulatory Visit: Payer: Self-pay | Admitting: Family Medicine

## 2014-06-10 ENCOUNTER — Telehealth: Payer: Self-pay | Admitting: Family Medicine

## 2014-06-10 MED ORDER — PREDNISONE 20 MG PO TABS: ORAL_TABLET | ORAL | Status: DC

## 2014-06-10 NOTE — Telephone Encounter (Signed)
Med sent to pharm and pt aware 

## 2014-06-10 NOTE — Telephone Encounter (Signed)
Patient has terrible migrane, would like to know if dr pickard will call in muscle relaxer or prednisone to help with this, she said she cant look  Up or hardly stand  [phone] [facility]

## 2014-06-10 NOTE — Telephone Encounter (Signed)
Ok with prednisone taper pack. 

## 2014-06-17 ENCOUNTER — Ambulatory Visit: Payer: BC Managed Care – PPO | Admitting: Family Medicine

## 2014-06-18 ENCOUNTER — Encounter: Payer: Self-pay | Admitting: Family Medicine

## 2014-06-18 ENCOUNTER — Ambulatory Visit (INDEPENDENT_AMBULATORY_CARE_PROVIDER_SITE_OTHER): Payer: BLUE CROSS/BLUE SHIELD | Admitting: Family Medicine

## 2014-06-18 VITALS — BP 120/74 | HR 80 | Temp 98.5°F | Resp 16 | Ht 64.25 in | Wt 234.0 lb

## 2014-06-18 DIAGNOSIS — B351 Tinea unguium: Secondary | ICD-10-CM | POA: Diagnosis not present

## 2014-06-18 DIAGNOSIS — G44229 Chronic tension-type headache, not intractable: Secondary | ICD-10-CM

## 2014-06-18 MED ORDER — TERBINAFINE HCL 250 MG PO TABS: 250.0000 mg | ORAL_TABLET | Freq: Every day | ORAL | Status: DC

## 2014-06-18 MED ORDER — CYCLOBENZAPRINE HCL 10 MG PO TABS: 10.0000 mg | ORAL_TABLET | Freq: Three times a day (TID) | ORAL | Status: DC | PRN

## 2014-06-18 NOTE — Progress Notes (Signed)
Subjective:    Patient ID: Amanda Hoover, female    DOB: [birth year], 59 y.o.   MRN: [id number]  HPI  Patient presents today reporting that the infection in her right toenails come back. Last year I treated the patient with Lamisil for onychomycosis in the right great toenail. After 3 months the toenail had improved dramatically although it did not completely cleared. Recently it has come back. The toenails becoming yellow and thick again. The distal medial portion has become white and hyperkeratotic.  She also reports occasional headaches. The headaches originate behind her right eye and radiating into her right occiput. She has a history of chronic headaches ever since she was a child. Most of them sound a tension headaches although they are migrainous features. She is tried Topamax in the past. This caused her significant side effects. However muscle relaxer seemed to help calm her headaches. She uses them sparingly. She would like a refill on Flexeril Past Medical History  Diagnosis Date  . GERD (gastroesophageal reflux disease)   . Hiatal hernia   . Osteopenia   . History of migraine   . Headache(784.0)     stress, sinus headaches  . Multiple environmental allergies   . History of colon polyps   . History of esophageal stricture     has had esophageal dilation x 1  . Palpitations     occasional; worse with ingestion of caffeine; no cardiologist  . Depression     no current meds.  . Dental crowns present   . Rash 01/06/2012    right elbow  . Rhinitis, allergic     rhinitis  . Hiatal hernia   . Chronic headaches   . Osteopenia   . 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  . Breast cancer, right     ER 0 PR0 LN neg.  . History of radiation therapy 03/28/2012-05/17/2012    60.4 gray to right breast  . Cancer   . Hyperlipidemia   . Allergy    Past Surgical History  Procedure Laterality Date  . Colonoscopy w/ polypectomy    . Partial  mastectomy with needle localization and axillary sentinel lymph node bx  01/12/2012    Procedure: PARTIAL MASTECTOMY WITH NEEDLE LOCALIZATION AND AXILLARY SENTINEL LYMPH NODE BX;  Surgeon: Adin Hector, MD;  Location: [facility];  Service: General;  Laterality: Right;  . Esophagogastroduodenoscopy    . Re-excision of breast cancer,superior margins  02/15/2012    Procedure: RE-EXCISION OF BREAST CANCER,SUPERIOR MARGINS;  Surgeon: Adin Hector, MD;  Location: [facility];  Service: General;  Laterality: Right;  Re-excision of right lumpectomy, close posterior margin.   Current Outpatient Prescriptions on File Prior to Visit  Medication Sig Dispense Refill  . buPROPion (WELLBUTRIN XL) 150 MG 24 hr tablet TAKE 1 TABLET by mouth EVERY DAY 90 tablet 2  . Calcium-Vitamin D (CALTRATE 600 PLUS-VIT D PO) Take by mouth.      . cetirizine (ZYRTEC) 10 MG chewable tablet Chew 10 mg by mouth daily.    . Cholecalciferol (VITAMIN D-3 PO) Take 1,000 Units by mouth.     . EPINEPHrine (EPIPEN) 0.3 mg/0.3 mL SOAJ injection Inject 0.3 mLs (0.3 mg total) into the muscle once. 2 Device 2  . ipratropium (ATROVENT) 0.03 % nasal spray Place 2 sprays into both nostrils 2 (two) times daily. 30 mL 0  . mometasone (ELOCON) 0.1 % ointment Apply topically daily. 45 g 0  .  Multiple Vitamin (MULTIVITAMIN) tablet Take 1 tablet by mouth daily.    . Omega-3 Fatty Acids (FISH OIL PO) Take by mouth.    . pantoprazole (PROTONIX) 40 MG tablet Take 1 tablet (40 mg total) by mouth daily. 90 tablet 3  . tamoxifen (NOLVADEX) 20 MG tablet Take 1 tablet (20 mg total) by mouth daily. 90 tablet 3  . terbinafine (LAMISIL) 250 MG tablet Take 1 tablet (250 mg total) by mouth daily. (Patient not taking: Reported on 06/18/2014) 30 tablet 1   No current facility-administered medications on file prior to visit.   Allergies  Allergen Reactions  . Macadamia Nut Oil Anaphylaxis  . Other Swelling    POPCORN:   SWELLING THROAT  . Peanut-Containing Drug Products Swelling and Other (See Comments)    THROAT TIGHTNESS; ALSO WALNUTS  . Tomato Swelling    SWELLING UVULA  . Adhesive [Tape] Other (See Comments)    BLISTERS  . Flour Other (See Comments)    SNEEZING  . Penicillins Hives  . Hydrocodone Hives  . Oxycodone   . Vicodin [Hydrocodone-Acetaminophen] Swelling    Pt had flushing, then itching and swelling of lips  . Eggs Or Egg-Derived Products Other (See Comments)    POSITIVE ON ALLERGY TEST, BUT EATS EGGS WITHOUT PROBLEM  . Soap Other (See Comments)    FRAGRANCES (SOAP/LOTION/PERFUME):  HEADACHE, RUNNY NOSE  . Tramadol Nausea And Vomiting   History   Social History  . Marital Status: Married    Spouse Name: N/A  . Number of Children: 4  . Years of Education: N/A   Occupational History  .      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 Smoking status: Former Smoker -- 0.50 packs/day for 5 years    Types: Cigarettes    Quit date: 03/15/2000  . Smokeless tobacco: Never Used  . Alcohol Use: No  . Drug Use: No     Comment: quit smoking 11 years ago  . Sexual Activity: Yes    Birth Control/ Protection: Post-menopausal   Other Topics Concern  . Not on file   Social History Narrative     Review of Systems  All other systems reviewed and are negative.      Objective:   Physical Exam  Constitutional: She is oriented to person, place, and time.  Cardiovascular: Normal rate, regular rhythm and normal heart sounds.   Pulmonary/Chest: Breath sounds normal. No respiratory distress. She has no wheezes. She has no rales.  Neurological: She is alert and oriented to person, place, and time. She has normal reflexes. She displays normal reflexes. No cranial nerve deficit. She exhibits normal muscle tone. Coordination normal.  Vitals reviewed.         Assessment & Plan:  Onychomycosis - Plan: terbinafine (LAMISIL) 250 MG tablet  Chronic tension-type  headache, not intractable - Plan: cyclobenzaprine (FLEXERIL) 10 MG tablet  Lamisil 250 mg by mouth daily. Patient can take this up to 6 months. I would like to check liver function test every month while she is on the medication. I did one the patient about possible liver toxicity. She is to come in every month to have a CMP drawn prior to getting her next refill. I will refill the patient's Flexeril. She can use 1 pill every 8 hours as needed for severe tension-type headache

## 2014-07-19 ENCOUNTER — Telehealth: Payer: Self-pay | Admitting: Family Medicine

## 2014-07-19 NOTE — Telephone Encounter (Signed)
NTBS, sounds infected

## 2014-07-19 NOTE — Telephone Encounter (Signed)
Pt made aware need to find [facility] care and have area seen.

## 2014-07-19 NOTE — Telephone Encounter (Signed)
Pt is in [place].  Recently had skin cancer on chest above rt breast.  Saw Dr Nevada Crane in [place].  He has frozen the area.  Again she is in [place], area is bigger then silver dollar, Very puffed up and draining, Foul oder.  What can you recommend or should she see Urgent Care down there.

## 2014-09-12 ENCOUNTER — Other Ambulatory Visit: Payer: Self-pay | Admitting: Family Medicine

## 2014-09-12 NOTE — Telephone Encounter (Signed)
Refill appropriate and filled per protocol. 

## 2014-10-10 ENCOUNTER — Encounter: Payer: Self-pay | Admitting: Family Medicine

## 2014-10-10 ENCOUNTER — Other Ambulatory Visit: Payer: BLUE CROSS/BLUE SHIELD

## 2014-10-10 DIAGNOSIS — B351 Tinea unguium: Secondary | ICD-10-CM

## 2014-10-10 DIAGNOSIS — M8580 Other specified disorders of bone density and structure, unspecified site: Secondary | ICD-10-CM

## 2014-10-10 DIAGNOSIS — Z79899 Other long term (current) drug therapy: Secondary | ICD-10-CM

## 2014-10-10 DIAGNOSIS — E785 Hyperlipidemia, unspecified: Secondary | ICD-10-CM

## 2014-10-10 LAB — LIPID PANEL
CHOL/HDL RATIO: 3.8 ratio (ref <=5.0)
CHOLESTEROL: 195 mg/dL (ref 125–200)
HDL: 51 mg/dL (ref >=46)
LDL Cholesterol: 119 mg/dL (ref <130)
TRIGLYCERIDES: 125 mg/dL (ref <150)
VLDL: 25 mg/dL (ref <30)

## 2014-10-10 LAB — CBC WITH DIFFERENTIAL/PLATELET
BASOS ABS: 0 10*3/uL (ref 0.0–0.1)
Basophils Relative: 0 % (ref 0–1)
EOS ABS: 0.1 10*3/uL (ref 0.0–0.7)
Eosinophils Relative: 3 % (ref 0–5)
HCT: 39.3 % (ref 36.0–46.0)
Hemoglobin: 13.3 g/dL (ref 12.0–15.0)
Lymphocytes Relative: 32 % (ref 12–46)
Lymphs Abs: 1.5 10*3/uL (ref 0.7–4.0)
MCH: 32.8 pg (ref 26.0–34.0)
MCHC: 33.8 g/dL (ref 30.0–36.0)
MCV: 96.8 fL (ref 78.0–100.0)
MONO ABS: 0.3 10*3/uL (ref 0.1–1.0)
MPV: 10.6 fL (ref 8.6–12.4)
Monocytes Relative: 7 % (ref 3–12)
NEUTROS ABS: 2.8 10*3/uL (ref 1.7–7.7)
NEUTROS PCT: 58 % (ref 43–77)
PLATELETS: 123 10*3/uL — AB (ref 150–400)
RBC: 4.06 MIL/uL (ref 3.87–5.11)
RDW: 13.8 % (ref 11.5–15.5)
WBC: 4.8 10*3/uL (ref 4.0–10.5)

## 2014-10-10 LAB — COMPLETE METABOLIC PANEL WITH GFR
ALT: 27 U/L (ref 6–29)
AST: 28 U/L (ref 10–35)
Albumin: 3.9 g/dL (ref 3.6–5.1)
Alkaline Phosphatase: 82 U/L (ref 33–130)
BUN: 10 mg/dL (ref 7–25)
CO2: 21 mEq/L (ref 20–31)
Calcium: 9.1 mg/dL (ref 8.6–10.4)
Chloride: 109 mEq/L (ref 98–110)
Creat: 0.87 mg/dL (ref 0.50–0.99)
GFR, Est African American: 84 mL/min (ref >=60)
GFR, Est Non African American: 73 mL/min (ref >=60)
GLUCOSE: 106 mg/dL — AB (ref 70–99)
POTASSIUM: 4.2 meq/L (ref 3.5–5.3)
SODIUM: 141 meq/L (ref 135–146)
TOTAL PROTEIN: 6.7 g/dL (ref 6.1–8.1)
Total Bilirubin: 0.5 mg/dL (ref 0.2–1.2)

## 2014-10-10 LAB — TSH: TSH: 2.354 u[IU]/mL (ref 0.350–4.500)

## 2014-10-11 LAB — VITAMIN D 25 HYDROXY (VIT D DEFICIENCY, FRACTURES): Vit D, 25-Hydroxy: 32 ng/mL (ref 30–100)

## 2014-10-15 ENCOUNTER — Encounter: Payer: Self-pay | Admitting: Family Medicine

## 2014-10-15 ENCOUNTER — Ambulatory Visit (INDEPENDENT_AMBULATORY_CARE_PROVIDER_SITE_OTHER): Payer: BLUE CROSS/BLUE SHIELD | Admitting: Family Medicine

## 2014-10-15 VITALS — BP 120/74 | HR 80 | Temp 98.6°F | Resp 16 | Ht 64.25 in | Wt 237.0 lb

## 2014-10-15 DIAGNOSIS — R0789 Other chest pain: Secondary | ICD-10-CM | POA: Diagnosis not present

## 2014-10-15 NOTE — Progress Notes (Signed)
Subjective:    Patient ID: Amanda Hoover, female    DOB: 23-Oct-1955, 59 y.o.   MRN: [id number]  HPI Patient recently had lab work obtained for general checkup which is listed below. She came in today to discuss the results of this: Lab on 10/10/2014  Component Date Value Ref Range Status  . Sodium 10/10/2014 141  135 - 146 mEq/L Final  . Potassium 10/10/2014 4.2  3.5 - 5.3 mEq/L Final  . Chloride 10/10/2014 109  98 - 110 mEq/L Final  . CO2 10/10/2014 21  20 - 31 mEq/L Final  . Glucose, Bld 10/10/2014 106* 70 - 99 mg/dL Final  . BUN 10/10/2014 10  7 - 25 mg/dL Final  . Creat 10/10/2014 0.87  0.50 - 0.99 mg/dL Final  . Total Bilirubin 10/10/2014 0.5  0.2 - 1.2 mg/dL Final  . Alkaline Phosphatase 10/10/2014 82  33 - 130 U/L Final  . AST 10/10/2014 28  10 - 35 U/L Final  . ALT 10/10/2014 27  6 - 29 U/L Final  . Total Protein 10/10/2014 6.7  6.1 - 8.1 g/dL Final  . Albumin 10/10/2014 3.9  3.6 - 5.1 g/dL Final  . Calcium 10/10/2014 9.1  8.6 - 10.4 mg/dL Final  . GFR, Est African American 10/10/2014 84  >=60 mL/min Final  . GFR, Est Non African American 10/10/2014 73  >=60 mL/min Final   Comment:   The estimated GFR is a calculation valid for adults (>=17 years old) that uses the CKD-EPI algorithm to adjust for age and sex. It is   not to be used for children, pregnant women, hospitalized patients,    patients on dialysis, or with rapidly changing kidney function. According to the NKDEP, eGFR >89 is normal, 60-89 shows mild impairment, 30-59 shows moderate impairment, 15-29 shows severe impairment and <15 is ESRD.     . TSH 10/10/2014 2.354  0.350 - 4.500 uIU/mL Final  . Cholesterol 10/10/2014 195  125 - 200 mg/dL Final   Comment: ATP III Classification:       < 200        mg/dL        Desirable      200 - 239     mg/dL        Borderline High      >= 240        mg/dL        High     . Triglycerides 10/10/2014 125  <150 mg/dL Final  . HDL 10/10/2014 51  >=46 mg/dL Final  .  Total CHOL/HDL Ratio 10/10/2014 3.8  <=5.0 Ratio Final  . VLDL 10/10/2014 25  <30 mg/dL Final  . LDL Cholesterol 10/10/2014 119  <130 mg/dL Final   Comment:   Total Cholesterol/HDL Ratio:CHD Risk                        Coronary Heart Disease Risk Table                                        Men       Women          1/2 Average Risk              3.4        3.3  Average Risk              5.0        4.4           2X Average Risk              9.6        7.1           3X Average Risk             23.4       11.0 Use the calculated Patient Ratio above and the CHD Risk table  to determine the patient's CHD Risk. ATP III Classification (LDL):       < 100        mg/dL         Optimal      100 - 129     mg/dL         Near or Above Optimal      130 - 159     mg/dL         Borderline High      160 - 189     mg/dL         High       > 190        mg/dL         Very High     . WBC 10/10/2014 4.8  4.0 - 10.5 K/uL Final  . RBC 10/10/2014 4.06  3.87 - 5.11 MIL/uL Final  . Hemoglobin 10/10/2014 13.3  12.0 - 15.0 g/dL Final  . HCT 10/10/2014 39.3  36.0 - 46.0 % Final  . MCV 10/10/2014 96.8  78.0 - 100.0 fL Final  . MCH 10/10/2014 32.8  26.0 - 34.0 pg Final  . MCHC 10/10/2014 33.8  30.0 - 36.0 g/dL Final  . RDW 10/10/2014 13.8  11.5 - 15.5 % Final  . Platelets 10/10/2014 123* 150 - 400 K/uL Final  . MPV 10/10/2014 10.6  8.6 - 12.4 fL Final  . Neutrophils Relative % 10/10/2014 58  43 - 77 % Final  . Neutro Abs 10/10/2014 2.8  1.7 - 7.7 K/uL Final  . Lymphocytes Relative 10/10/2014 32  12 - 46 % Final  . Lymphs Abs 10/10/2014 1.5  0.7 - 4.0 K/uL Final  . Monocytes Relative 10/10/2014 7  3 - 12 % Final  . Monocytes Absolute 10/10/2014 0.3  0.1 - 1.0 K/uL Final  . Eosinophils Relative 10/10/2014 3  0 - 5 % Final  . Eosinophils Absolute 10/10/2014 0.1  0.0 - 0.7 K/uL Final  . Basophils Relative 10/10/2014 0  0 - 1 % Final  . Basophils Absolute 10/10/2014 0.0  0.0 - 0.1 K/uL Final  . Smear  Review 10/10/2014 Criteria for review not met   Final  . Vit D, 25-Hydroxy 10/10/2014 32  30 - 100 ng/mL Final   Comment: Vitamin D Status           25-OH Vitamin D        Deficiency                <20 ng/mL        Insufficiency         20 - 29 ng/mL        Optimal             > or = 30 ng/mL   For 25-OH Vitamin D testing on patients on D2-supplementation and patients for whom quantitation  of D2 and D3 fractions is required, the QuestAssureD 25-OH VIT D, (D2,D3), LC/MS/MS is recommended: order code [id number] (patients > 2 yrs).   Footnotes:  (1) ** Please note change in unit of measure and reference range(s). **      However now the patient is here she is complaining of atypical chest pain. Patient states that his occurred approximately 4 or 5 times over the last 2 months. The pain has no relationship to exertion. The pain will last a brief moment but it will occur frequently and recurrently  almost like spasms over a 2-5 minute interval.  She states the pain can be very sharp when it occurs. She describes it as a squeezing pain. She denies any angina. She denies any shortness of breath. She denies any dyspnea on exertion. She denies any change in her exercise tolerance. She denies any pleurisy. She denies any hemoptysis. She denies any cough. Patient had a nuclear stress test performed in 2010 that was normal. Pain feels similar to that episode Past Medical History  Diagnosis Date  . GERD (gastroesophageal reflux disease)   . Hiatal hernia   . Osteopenia   . History of migraine   . Headache(784.0)     stress, sinus headaches  . Multiple environmental allergies   . History of colon polyps   . History of esophageal stricture     has had esophageal dilation x 1  . Palpitations     occasional; worse with ingestion of caffeine; no cardiologist  . Depression     no current meds.  . Dental crowns present   . Rash 01/06/2012    right elbow  . Rhinitis, allergic     rhinitis  . Hiatal  hernia   . Chronic headaches   . Osteopenia   . 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  . Breast cancer, right     ER 0 PR0 LN neg.  . History of radiation therapy 03/28/2012-05/17/2012    60.4 gray to right breast  . Cancer   . Hyperlipidemia   . Allergy    Past Surgical History  Procedure Laterality Date  . Colonoscopy w/ polypectomy    . Partial mastectomy with needle localization and axillary sentinel lymph node bx  01/12/2012    Procedure: PARTIAL MASTECTOMY WITH NEEDLE LOCALIZATION AND AXILLARY SENTINEL LYMPH NODE BX;  Surgeon: Adin Hector, MD;  Location: [facility];  Service: General;  Laterality: Right;  . Esophagogastroduodenoscopy    . Re-excision of breast cancer,superior margins  02/15/2012    Procedure: RE-EXCISION OF BREAST CANCER,SUPERIOR MARGINS;  Surgeon: Adin Hector, MD;  Location: [facility];  Service: General;  Laterality: Right;  Re-excision of right lumpectomy, close posterior margin.   Current Outpatient Prescriptions on File Prior to Visit  Medication Sig Dispense Refill  . buPROPion (WELLBUTRIN XL) 150 MG 24 hr tablet TAKE 1 TABLET BY MOUTH  EVERY DAY 60 tablet 3  . EPINEPHrine (EPIPEN) 0.3 mg/0.3 mL SOAJ injection Inject 0.3 mLs (0.3 mg total) into the muscle once. 2 Device 2  . ipratropium (ATROVENT) 0.03 % nasal spray Place 2 sprays into both nostrils 2 (two) times daily. 30 mL 0  . pantoprazole (PROTONIX) 40 MG tablet Take 1 tablet (40 mg total) by mouth daily. 90 tablet 3  . tamoxifen (NOLVADEX) 20 MG tablet Take 1 tablet (20 mg total) by mouth daily. 90 tablet 3  . terbinafine (LAMISIL) 250 MG tablet Take 1  tablet (250 mg total) by mouth daily. 30 tablet 5  . Calcium-Vitamin D (CALTRATE 600 PLUS-VIT D PO) Take by mouth.      . cetirizine (ZYRTEC) 10 MG chewable tablet Chew 10 mg by mouth daily.    . Cholecalciferol (VITAMIN D-3 PO) Take 1,000 Units by mouth.     . Multiple Vitamin  (MULTIVITAMIN) tablet Take 1 tablet by mouth daily.    . Omega-3 Fatty Acids (FISH OIL PO) Take by mouth.     No current facility-administered medications on file prior to visit.   Allergies  Allergen Reactions  . Macadamia Nut Oil Anaphylaxis  . Other Swelling    POPCORN:  SWELLING THROAT  . Peanut-Containing Drug Products Swelling and Other (See Comments)    THROAT TIGHTNESS; ALSO WALNUTS  . Tomato Swelling    SWELLING UVULA  . Adhesive [Tape] Other (See Comments)    BLISTERS  . Flour Other (See Comments)    SNEEZING  . Penicillins Hives  . Hydrocodone Hives  . Oxycodone   . Vicodin [Hydrocodone-Acetaminophen] Swelling    Pt had flushing, then itching and swelling of lips  . Eggs Or Egg-Derived Products Other (See Comments)    POSITIVE ON ALLERGY TEST, BUT EATS EGGS WITHOUT PROBLEM  . Soap Other (See Comments)    FRAGRANCES (SOAP/LOTION/PERFUME):  HEADACHE, RUNNY NOSE  . Tramadol Nausea And Vomiting   History   Social History  . Marital Status: Married    Spouse Name: N/A  . Number of Children: 4  . Years of Education: N/A   Occupational History  .      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 Smoking status: Former Smoker -- 0.50 packs/day for 5 years    Types: Cigarettes    Quit date: 03/15/2000  . Smokeless tobacco: Never Used  . Alcohol Use: No  . Drug Use: No     Comment: quit smoking 11 years ago  . Sexual Activity: Yes    Birth Control/ Protection: Post-menopausal   Other Topics Concern  . Not on file   Social History Narrative      Review of Systems  All other systems reviewed and are negative.      Objective:   Physical Exam  Constitutional: She appears well-developed and well-nourished. No distress.  Neck: Neck supple. No JVD present. No thyromegaly present.  Cardiovascular: Normal rate, regular rhythm and normal heart sounds.  Exam reveals no gallop and no friction rub.   No murmur heard. Pulmonary/Chest:  Effort normal and breath sounds normal. No respiratory distress. She has no wheezes. She has no rales. She exhibits no tenderness.  Abdominal: Soft. Bowel sounds are normal. She exhibits no distension and no mass. There is no tenderness. There is no rebound and no guarding.  Musculoskeletal: She exhibits no edema.  Lymphadenopathy:    She has no cervical adenopathy.  Skin: She is not diaphoretic.  Vitals reviewed.         Assessment & Plan:  Atypical chest pain - Plan: EKG 12-Lead  Patient's EKG shows normal sinus rhythm with no ischemia or no infarction. She has normal intervals and normal axis. I believe the patient's chest pain is noncardiac in nature. I believe it is likely either anxiety related or more likely related to acid reflux. I have recommended that she increase her proton pump inhibitor twice daily for the next month and see if her chest pain improves. The remainder of her lab work is  excellent. Further changes at this time

## 2014-10-25 ENCOUNTER — Other Ambulatory Visit: Payer: Self-pay | Admitting: Oncology

## 2014-10-25 ENCOUNTER — Other Ambulatory Visit: Payer: Self-pay | Admitting: Family Medicine

## 2014-10-25 NOTE — Telephone Encounter (Signed)
Medication refilled per protocol. 

## 2014-10-30 ENCOUNTER — Telehealth: Payer: Self-pay | Admitting: Family Medicine

## 2014-10-30 MED ORDER — PANTOPRAZOLE SODIUM 40 MG PO TBEC: 40.0000 mg | DELAYED_RELEASE_TABLET | Freq: Two times a day (BID) | ORAL | Status: DC

## 2014-10-30 NOTE — Telephone Encounter (Signed)
Patient is calling to say since she had to start taking 2 of the protonix she has run out and would need new rx sent to [facility] if possible  Fax number is [phone]

## 2014-10-30 NOTE — Telephone Encounter (Signed)
Medication called/sent to requested pharmacy  

## 2014-11-13 ENCOUNTER — Other Ambulatory Visit: Payer: Self-pay | Admitting: Family Medicine

## 2014-11-13 DIAGNOSIS — Z9889 Other specified postprocedural states: Secondary | ICD-10-CM

## 2014-11-13 DIAGNOSIS — R921 Mammographic calcification found on diagnostic imaging of breast: Secondary | ICD-10-CM

## 2014-12-12 ENCOUNTER — Ambulatory Visit
Admission: RE | Admit: 2014-12-12 | Discharge: 2014-12-12 | Disposition: A | Discharge status: Home / Self Care | Payer: BLUE CROSS/BLUE SHIELD | Source: Ambulatory Visit | Attending: Family Medicine | Admitting: Family Medicine

## 2014-12-12 DIAGNOSIS — Z9889 Other specified postprocedural states: Secondary | ICD-10-CM

## 2014-12-12 DIAGNOSIS — R921 Mammographic calcification found on diagnostic imaging of breast: Secondary | ICD-10-CM

## 2014-12-25 ENCOUNTER — Ambulatory Visit (HOSPITAL_BASED_OUTPATIENT_CLINIC_OR_DEPARTMENT_OTHER): Payer: BLUE CROSS/BLUE SHIELD

## 2014-12-25 DIAGNOSIS — C50319 Malignant neoplasm of lower-inner quadrant of unspecified female breast: Secondary | ICD-10-CM

## 2014-12-25 DIAGNOSIS — C50312 Malignant neoplasm of lower-inner quadrant of left female breast: Secondary | ICD-10-CM | POA: Diagnosis not present

## 2014-12-25 LAB — CBC WITH DIFFERENTIAL/PLATELET
BASO%: 0.5 % (ref 0.0–2.0)
Basophils Absolute: 0 10*3/uL (ref 0.0–0.1)
EOS%: 1.8 % (ref 0.0–7.0)
Eosinophils Absolute: 0.1 10*3/uL (ref 0.0–0.5)
HEMATOCRIT: 39.3 % (ref 34.8–46.6)
HGB: 13.1 g/dL (ref 11.6–15.9)
LYMPH#: 1.8 10*3/uL (ref 0.9–3.3)
LYMPH%: 30.2 % (ref 14.0–49.7)
MCH: 32 pg (ref 25.1–34.0)
MCHC: 33.4 g/dL (ref 31.5–36.0)
MCV: 95.7 fL (ref 79.5–101.0)
MONO#: 0.5 10*3/uL (ref 0.1–0.9)
MONO%: 7.7 % (ref 0.0–14.0)
NEUT%: 59.8 % (ref 38.4–76.8)
NEUTROS ABS: 3.5 10*3/uL (ref 1.5–6.5)
PLATELETS: 114 10*3/uL — AB (ref 145–400)
RBC: 4.11 10*6/uL (ref 3.70–5.45)
RDW: 14 % (ref 11.2–14.5)
WBC: 5.9 10*3/uL (ref 3.9–10.3)

## 2014-12-25 LAB — COMPREHENSIVE METABOLIC PANEL (CC13)
ALBUMIN: 4.1 g/dL (ref 3.5–5.0)
ALT: 43 U/L (ref 0–55)
ANION GAP: 10 meq/L (ref 3–11)
AST: 48 U/L — ABNORMAL HIGH (ref 5–34)
Alkaline Phosphatase: 86 U/L (ref 40–150)
BILIRUBIN TOTAL: 0.85 mg/dL (ref 0.20–1.20)
BUN: 10.6 mg/dL (ref 7.0–26.0)
CALCIUM: 9.5 mg/dL (ref 8.4–10.4)
CO2: 23 meq/L (ref 22–29)
CREATININE: 0.9 mg/dL (ref 0.6–1.1)
Chloride: 107 mEq/L (ref 98–109)
EGFR: 68 mL/min/{1.73_m2} — ABNORMAL LOW (ref >90)
Glucose: 80 mg/dl (ref 70–140)
Potassium: 4 mEq/L (ref 3.5–5.1)
Sodium: 140 mEq/L (ref 136–145)
TOTAL PROTEIN: 7.3 g/dL (ref 6.4–8.3)

## 2014-12-26 ENCOUNTER — Other Ambulatory Visit: Payer: BC Managed Care – PPO

## 2014-12-26 ENCOUNTER — Other Ambulatory Visit: Payer: Self-pay | Admitting: Family Medicine

## 2014-12-26 NOTE — Telephone Encounter (Signed)
Refill appropriate and filled per protocol. 

## 2015-01-02 ENCOUNTER — Telehealth: Payer: Self-pay | Admitting: Oncology

## 2015-01-02 ENCOUNTER — Ambulatory Visit (HOSPITAL_BASED_OUTPATIENT_CLINIC_OR_DEPARTMENT_OTHER): Payer: BLUE CROSS/BLUE SHIELD | Admitting: Oncology

## 2015-01-02 ENCOUNTER — Other Ambulatory Visit: Payer: Self-pay | Admitting: Oncology

## 2015-01-02 VITALS — BP 117/46 | HR 65 | Temp 98.6°F | Resp 18 | Ht 64.25 in | Wt 237.2 lb

## 2015-01-02 DIAGNOSIS — M8580 Other specified disorders of bone density and structure, unspecified site: Secondary | ICD-10-CM | POA: Diagnosis not present

## 2015-01-02 DIAGNOSIS — D696 Thrombocytopenia, unspecified: Secondary | ICD-10-CM

## 2015-01-02 DIAGNOSIS — C50312 Malignant neoplasm of lower-inner quadrant of left female breast: Secondary | ICD-10-CM

## 2015-01-02 DIAGNOSIS — N898 Other specified noninflammatory disorders of vagina: Secondary | ICD-10-CM | POA: Diagnosis not present

## 2015-01-02 DIAGNOSIS — C50311 Malignant neoplasm of lower-inner quadrant of right female breast: Secondary | ICD-10-CM

## 2015-01-02 MED ORDER — TAMOXIFEN CITRATE 20 MG PO TABS: ORAL_TABLET | ORAL | Status: DC

## 2015-01-02 NOTE — Telephone Encounter (Signed)
Gave patient avs report and appointments for October 2017. °

## 2015-01-02 NOTE — Progress Notes (Signed)
ID: DELLAR TRABER   DOB: 29-Nov-1955  MR#: [id number]  LNL#:[id number]  PCP: Odette Fraction, MD GYN: Thomes Dinning MD SU: Fanny Skates MD OTHER MD: Gery Pray, MD;  Lucio Edward. MD;  Jerrell Belfast, MD    CHIEF COMPLAINT:  Right Breast Cancer  CURRENT TREATMENT: Tamoxifen   HISTORY OF PRESENT ILLNESS: From the original intake note:  Felesha had routine bilateral screening mammography 12/02/2011 showing heterogeneously dense breasts. New calcifications were noted in the right breast, and were further evaluated with right diagnostic mammography 12/10/2011. This confirmed a group of pleomorphic microcalcifications in the lower inner quadrant of the right breast. Biopsy of this area was obtained 12/17/2011, and showed ([id number]) ductal carcinoma in situ, with areas suspicious for microinvasion, the in situ component being high-grade, estrogen and progesterone receptor negative. On 12/24/2011 the patient underwent bilateral breast MRI. This showed post biopsy changes in the right lower inner quadrant, associated with an area of clumped nodular enhancement measuring up to 3.7 cm.  The patient's subsequent history is as detailed below.  INTERVAL HISTORY: Calinda returns today for followup of her breast cancer. The interval history is unremarkable. The hot flashes from tamoxifen have "burned out" and she is now much more comfortable with this medication. She does have a mild vaginal discharge which she did not realize was due to tamoxifen. She obtains a drug at a very good price.  REVIEW OF SYSTEMS: Kimoni Pickerill to work full-time. Her mother is now 88. Yameli's son, with his wife and 26-year-old daughter have moved in with Maudean's mother. The son is running the farm and also taking care of his grandmother. Despite that Nikyla sees her everyday, cooks or supper, and basically between work and caregiving has no time for herself. In other words she is not exercising. Aside from these  issues a detailed review of systems today was stable  PAST MEDICAL HISTORY: Past Medical History  Diagnosis Date  . GERD (gastroesophageal reflux disease)   . Hiatal hernia   . Osteopenia   . History of migraine   . Headache(784.0)     stress, sinus headaches  . Multiple environmental allergies   . History of colon polyps   . History of esophageal stricture     has had esophageal dilation x 1  . Palpitations     occasional; worse with ingestion of caffeine; no cardiologist  . Depression     no current meds.  . Dental crowns present   . Rash 01/06/2012    right elbow  . Rhinitis, allergic     rhinitis  . Hiatal hernia   . Chronic headaches   . Osteopenia   . 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  . Breast cancer, right     ER 0 PR0 LN neg.  . History of radiation therapy 03/28/2012-05/17/2012    60.4 gray to right breast  . Cancer   . Hyperlipidemia   . Allergy     PAST SURGICAL HISTORY: Past Surgical History  Procedure Laterality Date  . Colonoscopy w/ polypectomy    . Partial mastectomy with needle localization and axillary sentinel lymph node bx  01/12/2012    Procedure: PARTIAL MASTECTOMY WITH NEEDLE LOCALIZATION AND AXILLARY SENTINEL LYMPH NODE BX;  Surgeon: Adin Hector, MD;  Location: [facility];  Service: General;  Laterality: Right;  . Esophagogastroduodenoscopy    . Re-excision of breast cancer,superior margins  02/15/2012    Procedure: RE-EXCISION OF BREAST CANCER,SUPERIOR MARGINS;  Surgeon: Adin Hector, MD;  Location: [facility];  Service: General;  Laterality: Right;  Re-excision of right lumpectomy, close posterior margin.    FAMILY HISTORY Family History  Problem Relation Age of Onset  . Breast cancer    . Liver disease    . Crohn's disease Mother   . Irritable bowel syndrome Mother   . Esophageal cancer Father 87  . Breast cancer Paternal Aunt     dx in her early 34s  .  Diabetes Maternal Grandmother   . Breast cancer Paternal Grandmother 59  . Diabetes Paternal Grandmother   . Cancer Maternal Uncle     unknown form of cancer  . Melanoma Cousin     maternal cousin dx in her 27s  [name] died of cancer of the esophagus at age 29. He had been diagnosed with this shortly before. [name] is currently 64 years old (as of October 2013). [name] was diagnosed with breast cancer at the age of 40. [name] was also diagnosed with breast cancer, at the age of 37. [name] (her paternal grandmothers only sister) was diagnosed with breast cancer in her 15s. The patient had one brother who died from a viral illness at the age of 93. There is no history of ovarian cancer in the family.  GYNECOLOGIC HISTORY:  (Reviewed 07/26/2013)  Menarche age 59, last menstrual period at age 59. The patient took hormone replacement for approximately one year. She is GX P4, first live birth age 59  SOCIAL HISTORY: (updated  07/26/2013 ) Aylani is working in [employer]  at [employer] currently, primarily [employer] . Her husband Marguerite Olea works in the [employer]. Son Vivia Budge is an Clinical biochemist and [employer]. Son Hartley Barefoot lives in [place] where he works as a Restaurant manager, fast food. Daughter Arcelia Jew works as a Scientist, clinical (histocompatibility and immunogenetics) in [employer]. The patient has 4 grandchildren. She attends a [employer].   ADVANCED DIRECTIVES: in place  HEALTH MAINTENANCE: (Updated 07/26/2013) Social History  Substance Use Topics  . Smoking status: Former Smoker -- 0.50 packs/day for 5 years    Types: Cigarettes    Quit date: 03/15/2000  . Smokeless tobacco: Never Used  . Alcohol Use: No     Colonoscopy: 2013/Dr. Fuller Plan  PAP: 2012/ Dr. Pamala Hurry  Bone density: 01/23/2013, osteopenia, T score is -1.7  Lipid panel: December 2014/ Dr. Dennard Schaumann    Allergies  Allergen Reactions  . Macadamia Nut Oil  Anaphylaxis  . Other Swelling    POPCORN:  SWELLING THROAT  . Peanut-Containing Drug Products Swelling and Other (See Comments)    THROAT TIGHTNESS; ALSO WALNUTS  . Tomato Swelling    SWELLING UVULA  . Adhesive [Tape] Other (See Comments)    BLISTERS  . Flour Other (See Comments)    SNEEZING  . Penicillins Hives  . Hydrocodone Hives  . Oxycodone   . Vicodin [Hydrocodone-Acetaminophen] Swelling    Pt had flushing, then itching and swelling of lips  . Eggs Or Egg-Derived Products Other (See Comments)    POSITIVE ON ALLERGY TEST, BUT EATS EGGS WITHOUT PROBLEM  . Soap Other (See Comments)    FRAGRANCES (SOAP/LOTION/PERFUME):  HEADACHE, RUNNY NOSE  . Tramadol Nausea And Vomiting    Current Outpatient Prescriptions  Medication Sig Dispense Refill  . buPROPion (WELLBUTRIN XL) 150 MG 24 hr tablet TAKE 1 TABLET BY MOUTH  EVERY DAY 60 tablet 3  . Calcium-Vitamin  D (CALTRATE 600 PLUS-VIT D PO) Take by mouth.      . cetirizine (ZYRTEC) 10 MG chewable tablet Chew 10 mg by mouth daily.    Marland Kitchen EPINEPHrine (EPIPEN) 0.3 mg/0.3 mL SOAJ injection Inject 0.3 mLs (0.3 mg total) into the muscle once. 2 Device 2  . ipratropium (ATROVENT) 0.03 % nasal spray Place 2 sprays into both nostrils 2 (two) times daily. 30 mL 0  . Multiple Vitamin (MULTIVITAMIN) tablet Take 1 tablet by mouth daily.    . pantoprazole (PROTONIX) 40 MG tablet Take 1 tablet by mouth two  times daily before meals 180 tablet 3  . tamoxifen (NOLVADEX) 20 MG tablet TAKE 1 TABLET BY MOUTH  EVERY DAY 60 tablet 1   No current facility-administered medications for this visit.    OBJECTIVE: Middle-aged white woman who appears well  Filed Vitals:   01/02/15 1342  BP: 117/46  Pulse: 65  Temp: 98.6 F (37 C)  Resp: 18     Body mass index is 40.4 kg/(m^2).    ECOG FS: 0 Filed Weights   01/02/15 1342  Weight: 237 lb 3.2 oz (107.593 kg)   Sclerae unicteric, EOMs intact Oropharynx clear, dentition in good repair No cervical or  supraclavicular adenopathy Lungs no rales or rhonchi Heart regular rate and rhythm Abd soft, obese,nontender, positive bowel sounds MSK no focal spinal tenderness, no upper extremity lymphedema Neuro: nonfocal, well oriented, appropriate affect Breasts: the right breast is status post lumpectomy and radiation. There is no evidence of local recurrence. The right axilla is benign. Left breast is unremarkable.  LAB RESULTS: Lab Results  Component Value Date   WBC 5.9 12/25/2014   NEUTROABS 3.5 12/25/2014   HGB 13.1 12/25/2014   HCT 39.3 12/25/2014   MCV 95.7 12/25/2014   PLT 114* 12/25/2014      Chemistry      Component Value Date/Time   NA 140 12/25/2014 1529   NA 141 10/10/2014 1111   NA 138 08/30/2008   K 4.0 12/25/2014 1529   K 4.2 10/10/2014 1111   CL 109 10/10/2014 1111   CL 106 12/29/2011 1304   CO2 23 12/25/2014 1529   CO2 21 10/10/2014 1111   BUN 10.6 12/25/2014 1529   BUN 10 10/10/2014 1111   BUN 12 08/30/2008   CREATININE 0.9 12/25/2014 1529   CREATININE 0.87 10/10/2014 1111   CREATININE 0.93 02/21/2009 0605   CREATININE 1.0 08/30/2008   GLU 99 08/30/2008      Component Value Date/Time   CALCIUM 9.5 12/25/2014 1529   CALCIUM 9.1 10/10/2014 1111   ALKPHOS 86 12/25/2014 1529   ALKPHOS 82 10/10/2014 1111   AST 48* 12/25/2014 1529   AST 28 10/10/2014 1111   ALT 43 12/25/2014 1529   ALT 27 10/10/2014 1111   BILITOT 0.85 12/25/2014 1529   BILITOT 0.5 10/10/2014 1111      STUDIES: CLINICAL DATA: 59 year old female presenting for routine postlumpectomy follow-up of the right breast. The patient had lumpectomy in October 2013.  EXAM: DIGITAL DIAGNOSTIC BILATERAL MAMMOGRAM WITH 3D TOMOSYNTHESIS AND CAD  COMPARISON: Previous exam(s).  ACR Breast Density Category b: There are scattered areas of fibroglandular density.  FINDINGS: The lumpectomy site in the central right breast is stable from prior. The right breast calcifications at the lumpectomy  site have now demonstrated 2 years of stability, compatible with a benign finding. There are no suspicious sites of distortion, calcifications or masses to suggest malignancy in the bilateral breasts.  Mammographic images were  processed with CAD.  IMPRESSION: 1. Stable right breast lumpectomy site, including the calcifications within the lumpectomy site. These calcifications have demonstrated 2 years of stability, compatible with a benign finding.  2. No evidence of malignancy in the bilateral breasts.  RECOMMENDATION: Diagnostic mammogram is suggested in 1 year for routine postlumpectomy follow-up. (Code:DM-B-01Y)  I have discussed the findings and recommendations with the patient. Results were also provided in writing at the conclusion of the visit. If applicable, a reminder letter will be sent to the patient regarding the next appointment.  BI-RADS CATEGORY 2: Benign.   Electronically Signed  By: Ammie Ferrier M.D.  On: 12/12/2014 15:27   ASSESSMENT: 59 y.o.  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 Restarting tamoxifen in May 2015.   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 Where going to continue  to see her on a once a year basis, shortly after her yearly mammography.  I have encouraged her to take walks during lunch or when she gets home in the evening if she can possibly began to do that. That will help relieve stress, control weight, and strengthen her bones.  She knows to call for any problems that may develop before her next visit here.  Chauncey Cruel, MD      01/02/2015

## 2015-03-05 ENCOUNTER — Other Ambulatory Visit: Payer: Self-pay | Admitting: Oncology

## 2015-03-17 ENCOUNTER — Other Ambulatory Visit: Payer: Self-pay | Admitting: Family Medicine

## 2015-03-21 ENCOUNTER — Encounter: Payer: Self-pay | Admitting: Family Medicine

## 2015-03-21 ENCOUNTER — Ambulatory Visit (INDEPENDENT_AMBULATORY_CARE_PROVIDER_SITE_OTHER): Payer: BLUE CROSS/BLUE SHIELD | Admitting: Family Medicine

## 2015-03-21 VITALS — BP 110/68 | HR 72 | Temp 98.8°F | Resp 18 | Ht 64.25 in | Wt 239.0 lb

## 2015-03-21 DIAGNOSIS — J019 Acute sinusitis, unspecified: Secondary | ICD-10-CM

## 2015-03-21 DIAGNOSIS — M25562 Pain in left knee: Secondary | ICD-10-CM | POA: Diagnosis not present

## 2015-03-21 MED ORDER — MELOXICAM 15 MG PO TABS
15.0000 mg | ORAL_TABLET | Freq: Every day | ORAL | Status: DC
Start: 2015-03-21 — End: 2015-08-26

## 2015-03-21 MED ORDER — LEVOFLOXACIN 500 MG PO TABS: 500.0000 mg | ORAL_TABLET | Freq: Every day | ORAL | Status: DC

## 2015-03-21 NOTE — Progress Notes (Signed)
Subjective:    Patient ID: Amanda Hoover, female    DOB: December 22, 1955, 60 y.o.   MRN: [id number]  HPI Patient has been battling a sinus infection for the last 2 weeks. She reports pain in her right maxillary and right frontal sinus that is constant. She reports sinus headaches. She reports sore throat due to constant postnasal drip. She's been trying antihistamines and decongestants for the last 2 weeks without benefit. She also complains of worsening pain in her left knee greater than her right knee. Pain is situated over the lateral joint line. She reports crepitus. She reports pain with ambulation. She denies any injury. She denies any joint laxity. She denies any falls or locking of the knee Past Medical History  Diagnosis Date  . GERD (gastroesophageal reflux disease)   . Hiatal hernia   . Osteopenia   . History of migraine   . Headache(784.0)     stress, sinus headaches  . Multiple environmental allergies   . History of colon polyps   . History of esophageal stricture     has had esophageal dilation x 1  . Palpitations     occasional; worse with ingestion of caffeine; no cardiologist  . Depression     no current meds.  . Dental crowns present   . Rash 01/06/2012    right elbow  . Rhinitis, allergic     rhinitis  . Hiatal hernia   . Chronic headaches   . Osteopenia   . 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  . Breast cancer, right     ER 0 PR0 LN neg.  . History of radiation therapy 03/28/2012-05/17/2012    60.4 gray to right breast  . Cancer   . Hyperlipidemia   . Allergy    Past Surgical History  Procedure Laterality Date  . Colonoscopy w/ polypectomy    . Partial mastectomy with needle localization and axillary sentinel lymph node bx  01/12/2012    Procedure: PARTIAL MASTECTOMY WITH NEEDLE LOCALIZATION AND AXILLARY SENTINEL LYMPH NODE BX;  Surgeon: Adin Hector, MD;  Location: [facility];  Service: General;   Laterality: Right;  . Esophagogastroduodenoscopy    . Re-excision of breast cancer,superior margins  02/15/2012    Procedure: RE-EXCISION OF BREAST CANCER,SUPERIOR MARGINS;  Surgeon: Adin Hector, MD;  Location: [facility];  Service: General;  Laterality: Right;  Re-excision of right lumpectomy, close posterior margin.   Current Outpatient Prescriptions on File Prior to Visit  Medication Sig Dispense Refill  . buPROPion (WELLBUTRIN XL) 150 MG 24 hr tablet Take 1 tablet by mouth  every day 60 tablet 5  . Calcium-Vitamin D (CALTRATE 600 PLUS-VIT D PO) Take by mouth.      . cetirizine (ZYRTEC) 10 MG chewable tablet Chew 10 mg by mouth daily.    Marland Kitchen EPINEPHrine (EPIPEN) 0.3 mg/0.3 mL SOAJ injection Inject 0.3 mLs (0.3 mg total) into the muscle once. 2 Device 2  . ipratropium (ATROVENT) 0.03 % nasal spray Place 2 sprays into both nostrils 2 (two) times daily. 30 mL 0  . Multiple Vitamin (MULTIVITAMIN) tablet Take 1 tablet by mouth daily.    . pantoprazole (PROTONIX) 40 MG tablet Take 1 tablet by mouth two  times daily before meals 180 tablet 3  . tamoxifen (NOLVADEX) 20 MG tablet Take 1 tablet by mouth  every day 60 tablet 6   No current facility-administered medications on file prior to visit.  Allergies  Allergen Reactions  . Macadamia Nut Oil Anaphylaxis  . Other Swelling    POPCORN:  SWELLING THROAT  . Peanut-Containing Drug Products Swelling and Other (See Comments)    THROAT TIGHTNESS; ALSO WALNUTS  . Tomato Swelling    SWELLING UVULA  . Adhesive [Tape] Other (See Comments)    BLISTERS  . Flour Other (See Comments)    SNEEZING  . Penicillins Hives  . Hydrocodone Hives  . Oxycodone   . Vicodin [Hydrocodone-Acetaminophen] Swelling    Pt had flushing, then itching and swelling of lips  . Eggs Or Egg-Derived Products Other (See Comments)    POSITIVE ON ALLERGY TEST, BUT EATS EGGS WITHOUT PROBLEM  . Soap Other (See Comments)    FRAGRANCES (SOAP/LOTION/PERFUME):   HEADACHE, RUNNY NOSE  . Tramadol Nausea And Vomiting   Social History   Social History  . Marital Status: Married    Spouse Name: N/A  . Number of Children: 4  . Years of Education: N/A   Occupational History  .      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 Smoking status: Former Smoker -- 0.50 packs/day for 5 years    Types: Cigarettes    Quit date: 03/15/2000  . Smokeless tobacco: Never Used  . Alcohol Use: No  . Drug Use: No     Comment: quit smoking 11 years ago  . Sexual Activity: Yes    Birth Control/ Protection: Post-menopausal   Other Topics Concern  . Not on file   Social History Narrative     Review of Systems  All other systems reviewed and are negative.      Objective:   Physical Exam  Constitutional: She appears well-developed and well-nourished.  HENT:  Right Ear: External ear normal.  Left Ear: External ear normal.  Nose: Mucosal edema and rhinorrhea present. Right sinus exhibits maxillary sinus tenderness and frontal sinus tenderness.  Mouth/Throat: Oropharynx is clear and moist. No oropharyngeal exudate.  Neck: Neck supple.  Cardiovascular: Normal rate, regular rhythm and normal heart sounds.   Pulmonary/Chest: Effort normal and breath sounds normal. No respiratory distress. She has no wheezes. She has no rales. She exhibits no tenderness.  Abdominal: Soft. Bowel sounds are normal. She exhibits no distension. There is no tenderness. There is no rebound and no guarding.  Musculoskeletal:       Left knee: She exhibits decreased range of motion and bony tenderness. She exhibits no swelling, no LCL laxity, normal meniscus and no MCL laxity. Tenderness found. Lateral joint line tenderness noted. No MCL and no LCL tenderness noted.  Vitals reviewed.         Assessment & Plan:  Acute rhinosinusitis - Plan: levofloxacin (LEVAQUIN) 500 MG tablet  Left knee pain - Plan: DG Knee Complete 4 Views Left, meloxicam (MOBIC) 15 MG  tablet  Given her allergy to penicillin, I'll treat her sinus infection with Levaquin 500 mg by mouth daily for 7 days. I believe the pain in her left knee secondary to osteoarthritis. I'll obtain an x-ray of the left knee and start the patient on meloxicam 15 mg by mouth daily. I recommended physical therapy and weight loss. If no improvement, I would recommend a cortisone injection in the knee

## 2015-04-11 ENCOUNTER — Telehealth: Payer: Self-pay | Admitting: Family Medicine

## 2015-04-11 MED ORDER — PANTOPRAZOLE SODIUM 40 MG PO TBEC: DELAYED_RELEASE_TABLET | ORAL | Status: DC

## 2015-04-11 NOTE — Telephone Encounter (Signed)
PT NEEDS PANTOPRAZOLE 3 MO REFILL (PT TAKES 2 A DAY) SENT TO [facility] FAX # [phone]  PT'S # [phone]

## 2015-04-11 NOTE — Telephone Encounter (Signed)
Medication called/sent to requested pharmacy  

## 2015-04-15 ENCOUNTER — Telehealth: Payer: Self-pay | Admitting: Family Medicine

## 2015-04-15 MED ORDER — CITALOPRAM HYDROBROMIDE 20 MG PO TABS: 20.0000 mg | ORAL_TABLET | Freq: Every day | ORAL | Status: DC

## 2015-04-15 NOTE — Telephone Encounter (Signed)
Sent notice form [facility] about possible interaction between the pt's Bupropion and the Tamoxifen.  The Bupropion can decrease the effectiveness of the Tamoxifen.  Provider wants to know if patient willing to switch her antidepressant.  She is willing to try Celexa 20 mg as recommended by provider.  Will send 30 day supply to local pharmacy and pt to let us know if has any problems.  If not will send 90 day supply to mail order.

## 2015-05-02 ENCOUNTER — Telehealth: Payer: Self-pay

## 2015-05-02 NOTE — Telephone Encounter (Signed)
Patient called concerned with taking tamoxifen and wellbutrin at the same time questioning whether the wellbutrin decreases the effectiveness of the tamoxifen.  Per Dr. Jana Hakim it is ok to take both meds together one doesn't cancel the other out.  Patient states understanding.

## 2015-07-03 ENCOUNTER — Ambulatory Visit (INDEPENDENT_AMBULATORY_CARE_PROVIDER_SITE_OTHER): Payer: BLUE CROSS/BLUE SHIELD | Admitting: Family Medicine

## 2015-07-03 ENCOUNTER — Encounter: Payer: Self-pay | Admitting: Family Medicine

## 2015-07-03 VITALS — BP 110/66 | HR 68 | Temp 98.6°F | Resp 16 | Ht 64.25 in | Wt 236.0 lb

## 2015-07-03 DIAGNOSIS — M5432 Sciatica, left side: Secondary | ICD-10-CM

## 2015-07-03 MED ORDER — PREDNISONE 20 MG PO TABS: ORAL_TABLET | ORAL | Status: DC

## 2015-07-03 NOTE — Progress Notes (Signed)
Subjective:    Patient ID: Amanda Hoover, female    DOB: 04/26/1955, 60 y.o.   MRN: [id number]  HPI Symptoms began Sunday. There was no specific injury but the patient started developing gradual low back pain. Over a period of hours the pain began to radiate into her left gluteus and down her left leg. The pain became intense to the point that she can even stand or walk. The pain was actually bad enough that she would cry. She denies any weakness in the legs but at times she could really move her leg without pain. She continues to have low back pain radiating her left leg although it is slowly improving Past Medical History  Diagnosis Date  . GERD (gastroesophageal reflux disease)   . Hiatal hernia   . Osteopenia   . History of migraine   . Headache(784.0)     stress, sinus headaches  . Multiple environmental allergies   . History of colon polyps   . History of esophageal stricture     has had esophageal dilation x 1  . Palpitations     occasional; worse with ingestion of caffeine; no cardiologist  . Depression     no current meds.  . Dental crowns present   . Rash 01/06/2012    right elbow  . Rhinitis, allergic     rhinitis  . Hiatal hernia   . Chronic headaches   . Osteopenia   . Breast cancer ([place]) 12/17/2011     bx=Ductal carcinoma w/calcifications,ER/PR= neg.  . Breast cancer, right ([place])     ER 0 PR0 LN neg.  . History of radiation therapy 03/28/2012-05/17/2012    60.4 gray to right breast  . Cancer ([place])   . Hyperlipidemia   . Allergy    Past Surgical History  Procedure Laterality Date  . Colonoscopy w/ polypectomy    . Partial mastectomy with needle localization and axillary sentinel lymph node bx  01/12/2012    Procedure: PARTIAL MASTECTOMY WITH NEEDLE LOCALIZATION AND AXILLARY SENTINEL LYMPH NODE BX;  Surgeon: Adin Hector, MD;  Location: [facility];  Service: General;  Laterality: Right;  . Esophagogastroduodenoscopy    . Re-excision of  breast cancer,superior margins  02/15/2012    Procedure: RE-EXCISION OF BREAST CANCER,SUPERIOR MARGINS;  Surgeon: Adin Hector, MD;  Location: [facility];  Service: General;  Laterality: Right;  Re-excision of right lumpectomy, close posterior margin.   Current Outpatient Prescriptions on File Prior to Visit  Medication Sig Dispense Refill  . Calcium-Vitamin D (CALTRATE 600 PLUS-VIT D PO) Take by mouth.      . cetirizine (ZYRTEC) 10 MG chewable tablet Chew 10 mg by mouth daily.    Marland Kitchen EPINEPHrine (EPIPEN) 0.3 mg/0.3 mL SOAJ injection Inject 0.3 mLs (0.3 mg total) into the muscle once. 2 Device 2  . ipratropium (ATROVENT) 0.03 % nasal spray Place 2 sprays into both nostrils 2 (two) times daily. 30 mL 0  . meloxicam (MOBIC) 15 MG tablet Take 1 tablet (15 mg total) by mouth daily. 30 tablet 0  . Multiple Vitamin (MULTIVITAMIN) tablet Take 1 tablet by mouth daily.    . pantoprazole (PROTONIX) 40 MG tablet Take 1 tablet PO BID 180 tablet 3  . tamoxifen (NOLVADEX) 20 MG tablet Take 1 tablet by mouth  every day 60 tablet 6   No current facility-administered medications on file prior to visit.   Allergies  Allergen Reactions  . Macadamia Nut Oil Anaphylaxis  .  Other Swelling    POPCORN:  SWELLING THROAT  . Peanut-Containing Drug Products Swelling and Other (See Comments)    THROAT TIGHTNESS; ALSO WALNUTS  . Tomato Swelling    SWELLING UVULA  . Adhesive [Tape] Other (See Comments)    BLISTERS  . Flour Other (See Comments)    SNEEZING  . Penicillins Hives  . Hydrocodone Hives  . Oxycodone   . Vicodin [Hydrocodone-Acetaminophen] Swelling    Pt had flushing, then itching and swelling of lips  . Eggs Or Egg-Derived Products Other (See Comments)    POSITIVE ON ALLERGY TEST, BUT EATS EGGS WITHOUT PROBLEM  . Soap Other (See Comments)    FRAGRANCES (SOAP/LOTION/PERFUME):  HEADACHE, RUNNY NOSE  . Tramadol Nausea And Vomiting   Social History   Social History  . Marital  Status: Married    Spouse Name: N/A  . Number of Children: 4  . Years of Education: N/A   Occupational History  .      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 Smoking status: Former Smoker -- 0.50 packs/day for 5 years    Types: Cigarettes    Quit date: 03/15/2000  . Smokeless tobacco: Never Used  . Alcohol Use: No  . Drug Use: No     Comment: quit smoking 11 years ago  . Sexual Activity: Yes    Birth Control/ Protection: Post-menopausal   Other Topics Concern  . Not on file   Social History Narrative      Review of Systems  All other systems reviewed and are negative.      Objective:   Physical Exam  Cardiovascular: Normal rate, regular rhythm and normal heart sounds.   Pulmonary/Chest: Effort normal and breath sounds normal.  Musculoskeletal:       Lumbar back: She exhibits decreased range of motion, tenderness and pain.  Vitals reviewed.         Assessment & Plan:  Left sided sciatica - Plan: predniSONE (DELTASONE) 20 MG tablet  Patient has sciatica. Begin prednisone taper pack. Recheck in one week if no better or sooner if worse

## 2015-07-31 ENCOUNTER — Telehealth: Payer: Self-pay | Admitting: Family Medicine

## 2015-07-31 NOTE — Telephone Encounter (Signed)
Patient calling to say she had referral placed some time ago for her knee and never had it done due to the cost of having [facility], would like to know if the mri can be done somewhere besides a [facility]  Please call her at [phone]

## 2015-08-18 NOTE — Telephone Encounter (Signed)
Left message for patient to contact her insurance and see who they recommend she have MRI done with.  Once she gets that information we can proceed.

## 2015-08-21 ENCOUNTER — Telehealth: Payer: Self-pay | Admitting: Family Medicine

## 2015-08-21 DIAGNOSIS — M5430 Sciatica, unspecified side: Secondary | ICD-10-CM

## 2015-08-21 NOTE — Telephone Encounter (Signed)
MRI orders placed.   Call placed to patient and patient made aware via VM.

## 2015-08-21 NOTE — Telephone Encounter (Signed)
Please advise what image to order and if both can be done?

## 2015-08-21 NOTE — Telephone Encounter (Signed)
Spoke with patient regarding MRI need.  Advised her that since there is not a clear picture of what she wants she needs to come into the office to discuss.  Patient agreed and appointment made.

## 2015-08-21 NOTE — Telephone Encounter (Signed)
Patient calling back to let you know that he imaging can be done with any facility that is not [facility], also wants to know if she can go ahead and get both knees instead of just one because they are both bothering her  [phone]

## 2015-08-21 NOTE — Telephone Encounter (Signed)
Based on my last OV, she was having left sciatica causing left leg pain.  Image would be MRI of L spine.

## 2015-08-26 ENCOUNTER — Encounter: Payer: Self-pay | Admitting: Family Medicine

## 2015-08-26 ENCOUNTER — Ambulatory Visit (INDEPENDENT_AMBULATORY_CARE_PROVIDER_SITE_OTHER): Payer: BLUE CROSS/BLUE SHIELD | Admitting: Family Medicine

## 2015-08-26 VITALS — BP 126/74 | HR 80 | Temp 98.3°F | Resp 16 | Ht 64.25 in | Wt 236.0 lb

## 2015-08-26 DIAGNOSIS — M25562 Pain in left knee: Secondary | ICD-10-CM | POA: Diagnosis not present

## 2015-08-26 MED ORDER — MELOXICAM 15 MG PO TABS: 15.0000 mg | ORAL_TABLET | Freq: Every day | ORAL | Status: DC

## 2015-08-26 NOTE — Progress Notes (Signed)
Subjective:    Patient ID: Amanda Hoover, female    DOB: 1955-07-19, 60 y.o.   MRN: [id number]  HPI Patient has a history of left knee pain. It is now spreading to the right knee. She has never had x-rays. She reports crepitus in the knee going up and down steps. The worst pain is over the lateral joint line. She denies any laxity to varus or valgus stress. She has a negative anterior and posterior drawer sign. She does report occasional effusions. Past Medical History  Diagnosis Date  . GERD (gastroesophageal reflux disease)   . Hiatal hernia   . Osteopenia   . History of migraine   . Headache(784.0)     stress, sinus headaches  . Multiple environmental allergies   . History of colon polyps   . History of esophageal stricture     has had esophageal dilation x 1  . Palpitations     occasional; worse with ingestion of caffeine; no cardiologist  . Depression     no current meds.  . Dental crowns present   . Rash 01/06/2012    right elbow  . Rhinitis, allergic     rhinitis  . Hiatal hernia   . Chronic headaches   . Osteopenia   . 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  . Breast cancer, right ([place])     ER 0 PR0 LN neg.  . History of radiation therapy 03/28/2012-05/17/2012    60.4 gray to right breast  . Cancer ([place])   . Hyperlipidemia   . Allergy    Past Surgical History  Procedure Laterality Date  . Colonoscopy w/ polypectomy    . Partial mastectomy with needle localization and axillary sentinel lymph node bx  01/12/2012    Procedure: PARTIAL MASTECTOMY WITH NEEDLE LOCALIZATION AND AXILLARY SENTINEL LYMPH NODE BX;  Surgeon: Adin Hector, MD;  Location: [facility];  Service: General;  Laterality: Right;  . Esophagogastroduodenoscopy    . Re-excision of breast cancer,superior margins  02/15/2012    Procedure: RE-EXCISION OF BREAST CANCER,SUPERIOR MARGINS;  Surgeon: Adin Hector, MD;  Location: [facility];  Service: General;  Laterality: Right;  Re-excision of right lumpectomy, close posterior margin.   Current Outpatient Prescriptions on File Prior to Visit  Medication Sig Dispense Refill  . buPROPion (WELLBUTRIN SR) 150 MG 12 hr tablet Take 150 mg by mouth 2 (two) times daily.    . cetirizine (ZYRTEC) 10 MG chewable tablet Chew 10 mg by mouth daily.    Marland Kitchen EPINEPHrine (EPIPEN) 0.3 mg/0.3 mL SOAJ injection Inject 0.3 mLs (0.3 mg total) into the muscle once. 2 Device 2  . Multiple Vitamin (MULTIVITAMIN) tablet Take 1 tablet by mouth daily.    . pantoprazole (PROTONIX) 40 MG tablet Take 1 tablet PO BID 180 tablet 3  . tamoxifen (NOLVADEX) 20 MG tablet Take 1 tablet by mouth  every day 60 tablet 6   No current facility-administered medications on file prior to visit.   Allergies  Allergen Reactions  . Macadamia Nut Oil Anaphylaxis  . Other Swelling    POPCORN:  SWELLING THROAT  . Peanut-Containing Drug Products Swelling and Other (See Comments)    THROAT TIGHTNESS; ALSO WALNUTS  . Tomato Swelling    SWELLING UVULA  . Adhesive [Tape] Other (See Comments)    BLISTERS  . Flour Other (See Comments)    SNEEZING  . Penicillins Hives  . Hydrocodone Hives  .  Oxycodone   . Vicodin [Hydrocodone-Acetaminophen] Swelling    Pt had flushing, then itching and swelling of lips  . Eggs Or Egg-Derived Products Other (See Comments)    POSITIVE ON ALLERGY TEST, BUT EATS EGGS WITHOUT PROBLEM  . Soap Other (See Comments)    FRAGRANCES (SOAP/LOTION/PERFUME):  HEADACHE, RUNNY NOSE  . Tramadol Nausea And Vomiting   Social History   Social History  . Marital Status: Married    Spouse Name: N/A  . Number of Children: 4  . Years of Education: N/A   Occupational History  .      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 Smoking status: Former Smoker -- 0.50 packs/day for 5 years    Types: Cigarettes    Quit date: 03/15/2000  . Smokeless tobacco: Never Used  .  Alcohol Use: No  . Drug Use: No     Comment: quit smoking 11 years ago  . Sexual Activity: Yes    Birth Control/ Protection: Post-menopausal   Other Topics Concern  . Not on file   Social History Narrative      Review of Systems  All other systems reviewed and are negative.      Objective:   Physical Exam  Cardiovascular: Normal rate, regular rhythm and normal heart sounds.   Pulmonary/Chest: Effort normal and breath sounds normal.  Musculoskeletal:       Left knee: She exhibits effusion. She exhibits normal range of motion, no swelling, no erythema, normal alignment, no LCL laxity, normal patellar mobility, normal meniscus and no MCL laxity. Tenderness found. Medial joint line and lateral joint line tenderness noted. No MCL and no LCL tenderness noted.  Vitals reviewed.         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 Begin meloxicam 15 mg by mouth daily. Proceed with x-rays of left knee. Should pain persist, return for cortisone injection.

## 2015-11-15 ENCOUNTER — Encounter: Payer: Self-pay | Admitting: Family Medicine

## 2015-12-03 ENCOUNTER — Other Ambulatory Visit: Payer: Self-pay | Admitting: Oncology

## 2015-12-03 DIAGNOSIS — Z853 Personal history of malignant neoplasm of breast: Secondary | ICD-10-CM

## 2015-12-15 ENCOUNTER — Ambulatory Visit
Admission: RE | Admit: 2015-12-15 | Discharge: 2015-12-15 | Disposition: A | Discharge status: Home / Self Care | Payer: Self-pay | Source: Ambulatory Visit | Attending: Oncology | Admitting: Oncology

## 2015-12-15 DIAGNOSIS — R928 Other abnormal and inconclusive findings on diagnostic imaging of breast: Secondary | ICD-10-CM | POA: Diagnosis not present

## 2015-12-15 DIAGNOSIS — Z853 Personal history of malignant neoplasm of breast: Secondary | ICD-10-CM

## 2015-12-22 ENCOUNTER — Encounter: Payer: Self-pay | Admitting: Family Medicine

## 2015-12-23 ENCOUNTER — Other Ambulatory Visit: Payer: Self-pay | Admitting: *Deleted

## 2015-12-23 DIAGNOSIS — C50311 Malignant neoplasm of lower-inner quadrant of right female breast: Secondary | ICD-10-CM

## 2015-12-24 ENCOUNTER — Other Ambulatory Visit (HOSPITAL_BASED_OUTPATIENT_CLINIC_OR_DEPARTMENT_OTHER): Payer: BLUE CROSS/BLUE SHIELD

## 2015-12-24 ENCOUNTER — Other Ambulatory Visit (HOSPITAL_COMMUNITY)
Admission: RE | Admit: 2015-12-24 | Discharge: 2015-12-24 | Disposition: A | Discharge status: Home / Self Care | Payer: BLUE CROSS/BLUE SHIELD | Source: Ambulatory Visit | Attending: Oncology | Admitting: Oncology

## 2015-12-24 DIAGNOSIS — C50311 Malignant neoplasm of lower-inner quadrant of right female breast: Secondary | ICD-10-CM

## 2015-12-24 LAB — CBC WITH DIFFERENTIAL/PLATELET
BASO%: 0.6 % (ref 0.0–2.0)
Basophils Absolute: 0 10*3/uL (ref 0.0–0.1)
EOS ABS: 0.1 10*3/uL (ref 0.0–0.5)
EOS%: 1.8 % (ref 0.0–7.0)
HCT: 41.5 % (ref 34.8–46.6)
HGB: 13.8 g/dL (ref 11.6–15.9)
LYMPH%: 26.1 % (ref 14.0–49.7)
MCH: 32.3 pg (ref 25.1–34.0)
MCHC: 33.1 g/dL (ref 31.5–36.0)
MCV: 97.4 fL (ref 79.5–101.0)
MONO#: 0.6 10*3/uL (ref 0.1–0.9)
MONO%: 10.5 % (ref 0.0–14.0)
NEUT#: 3.6 10*3/uL (ref 1.5–6.5)
NEUT%: 61 % (ref 38.4–76.8)
PLATELETS: 109 10*3/uL — AB (ref 145–400)
RBC: 4.26 10*6/uL (ref 3.70–5.45)
RDW: 13.8 % (ref 11.2–14.5)
WBC: 5.9 10*3/uL (ref 3.9–10.3)
lymph#: 1.5 10*3/uL (ref 0.9–3.3)

## 2015-12-24 LAB — COMPREHENSIVE METABOLIC PANEL
ALBUMIN: 4.1 g/dL (ref 3.5–5.0)
ALK PHOS: 73 U/L (ref 38–126)
ALT: 31 U/L (ref 14–54)
AST: 35 U/L (ref 15–41)
Anion gap: 5 (ref 5–15)
BUN: 12 mg/dL (ref 6–20)
CALCIUM: 9.7 mg/dL (ref 8.9–10.3)
CO2: 27 mmol/L (ref 22–32)
CREATININE: 1.01 mg/dL — AB (ref 0.44–1.00)
Chloride: 108 mmol/L (ref 101–111)
GFR calc Af Amer: >60 mL/min (ref >60)
GFR calc non Af Amer: 59 mL/min — ABNORMAL LOW (ref >60)
GLUCOSE: 100 mg/dL — AB (ref 65–99)
Potassium: 4.3 mmol/L (ref 3.5–5.1)
SODIUM: 140 mmol/L (ref 135–145)
Total Bilirubin: 0.4 mg/dL (ref 0.3–1.2)
Total Protein: 7.3 g/dL (ref 6.5–8.1)

## 2015-12-28 NOTE — Progress Notes (Signed)
ID: Amanda Hoover   DOB: May 09, 1955  MR#: [id number]  ALP#:[id number]  PCP: Odette Fraction, MD GYN: Thomes Dinning MD SU: Fanny Skates MD OTHER MD: Gery Pray, MD;  Lucio Edward. MD;  Jerrell Belfast, MD    CHIEF COMPLAINT:  Right Breast Cancer  CURRENT TREATMENT: Tamoxifen   HISTORY OF PRESENT ILLNESS: From the original intake note:  [name] had routine bilateral screening mammography 12/02/2011 showing heterogeneously dense breasts. New calcifications were noted in the right breast, and were further evaluated with right diagnostic mammography 12/10/2011. This confirmed a group of pleomorphic microcalcifications in the lower inner quadrant of the right breast. Biopsy of this area was obtained 12/17/2011, and showed ([id number]) ductal carcinoma in situ, with areas suspicious for microinvasion, the in situ component being high-grade, estrogen and progesterone receptor negative. On 12/24/2011 the patient underwent bilateral breast MRI. This showed post biopsy changes in the right lower inner quadrant, associated with an area of clumped nodular enhancement measuring up to 3.7 cm.  The patient's subsequent history is as detailed below.  INTERVAL HISTORY: [name] returns today for followup of her estrogen receptor positive breast cancer. She continues on tamoxifen, with good tolerance. She obtains it at a good price.  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  REVIEW OF SYSTEMS: [name] is under great deal of stress. She works full time and then any free time she has to that indicates to her mother, who is 62. Her mother calls or at work, and is totally dependent on [name], who has no brothers or sisters. [name] has no time to exercise, is not on a good diet, and basically is neglecting herself. She has not had unusual headaches, visual changes, cough, phlegm production, pleurisy, or change in bowel or bladder habits. A detailed review of systems  today was otherwise stable  PAST MEDICAL HISTORY: Past Medical History:  Diagnosis Date  . Allergy   . 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  . Breast cancer, right ([place])    ER 0 PR0 LN neg.  . Cancer ([place])   . Chronic headaches   . Dental crowns present   . Depression    no current meds.  Marland Kitchen GERD (gastroesophageal reflux disease)   . Headache(784.0)    stress, sinus headaches  . Hiatal hernia   . Hiatal hernia   . History of colon polyps   . History of esophageal stricture    has had esophageal dilation x 1  . History of migraine   . History of radiation therapy 03/28/2012-05/17/2012   60.4 gray to right breast  . Hyperlipidemia   . Multiple environmental allergies   . Osteopenia   . Osteopenia   . Palpitations    occasional; worse with ingestion of caffeine; no cardiologist  . Rash 01/06/2012   right elbow  . Rhinitis, allergic    rhinitis    PAST SURGICAL HISTORY: Past Surgical History:  Procedure Laterality Date  . COLONOSCOPY W/ POLYPECTOMY    . ESOPHAGOGASTRODUODENOSCOPY    . PARTIAL MASTECTOMY WITH NEEDLE LOCALIZATION AND AXILLARY SENTINEL LYMPH NODE BX  01/12/2012   Procedure: PARTIAL MASTECTOMY WITH NEEDLE LOCALIZATION AND AXILLARY SENTINEL LYMPH NODE BX;  Surgeon: Adin Hector, MD;  Location: [facility];  Service: General;  Laterality: Right;  . RE-EXCISION OF BREAST CANCER,SUPERIOR MARGINS  02/15/2012   Procedure: RE-EXCISION OF BREAST CANCER,SUPERIOR MARGINS;  Surgeon: Adin Hector, MD;  Location: [facility];  Service: General;  Laterality: Right;  Re-excision of right lumpectomy, close posterior margin.    FAMILY HISTORY Family History  Problem Relation Age of Onset  . Breast cancer    . Liver disease    . Crohn's disease Mother   . Irritable bowel syndrome Mother   . Esophageal cancer Father 82  . Breast cancer Paternal Aunt     dx in her early 70s  . Diabetes Maternal  Grandmother   . Breast cancer Paternal Grandmother 59  . Diabetes Paternal Grandmother   . Cancer Maternal Uncle     unknown form of cancer  . Melanoma Cousin     maternal cousin dx in her 50s  [name] died of cancer of the esophagus at age 82. He had been diagnosed with this shortly before. [name] is currently 83 years old (as of October 2013). [name] was diagnosed with breast cancer at the age of 62. [name] was also diagnosed with breast cancer, at the age of 74. [name] (her paternal grandmothers only sister) was diagnosed with breast cancer in her 70s. The patient had one brother who died from a viral illness at the age of 17. There is no history of ovarian cancer in the family.  GYNECOLOGIC HISTORY:  (Reviewed 07/26/2013)  Menarche age 9, last menstrual period at age 40. The patient took hormone replacement for approximately 62 years. 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 Her husband Darrell works in the steel industry. Son John Rowley is an electrician and [place]. Son Matthew Rowley lives in [place] where he works as a chiropractor. Daughter Jessica Rowley works as a restaurant manager in [place]. The patient has 4 grandchildren. She attends a local [employer].   ADVANCED DIRECTIVES: in place  HEALTH MAINTENANCE: (Updated 07/26/2013) Social History  Substance Use Topics  . Smoking status: Former Smoker    Packs/day: 0.50    Years: 5.00    Types: Cigarettes    Quit date: 03/15/2000  . Smokeless tobacco: Never Used  . Alcohol use No     Colonoscopy: 2013/Dr. Stark  PAP: 2012/ Dr. Fogleman  Bone density: 01/23/2013, osteopenia, T score is -1.7  Lipid panel: December 2014/ Dr. Pickard    Allergies  Allergen Reactions  . Macadamia Nut Oil Anaphylaxis   . Other Swelling    POPCORN:  SWELLING THROAT  . Peanut-Containing Drug Products Swelling and Other (See Comments)    THROAT TIGHTNESS; ALSO WALNUTS  . Tomato Swelling    SWELLING UVULA  . Adhesive [Tape] Other (See Comments)    BLISTERS  . Flour Other (See Comments)    SNEEZING  . Penicillins Hives  . Hydrocodone Hives  . Oxycodone   . Vicodin [Hydrocodone-Acetaminophen] Swelling    Pt had flushing, then itching and swelling of lips  . Eggs Or Egg-Derived Products Other (See Comments)    POSITIVE ON ALLERGY TEST, BUT EATS EGGS WITHOUT PROBLEM  . Soap Other (See Comments)    FRAGRANCES (SOAP/LOTION/PERFUME):  HEADACHE, RUNNY NOSE  . Tramadol Nausea And Vomiting    Current Outpatient Prescriptions  Medication Sig Dispense Refill  . buPROPion (WELLBUTRIN SR) 150 MG 12 hr tablet Take 150 mg by mouth 2 (two) times daily.    . EPINEPHrine (EPIPEN) 0.3 mg/0.3 mL SOAJ   injection Inject 0.3 mLs (0.3 mg total) into the muscle once. 2 Device 2  . meloxicam (MOBIC) 15 MG tablet Take 1 tablet (15 mg total) by mouth daily. 30 tablet 5  . Multiple Vitamin (MULTIVITAMIN) tablet Take 1 tablet by mouth daily.    . pantoprazole (PROTONIX) 40 MG tablet Take 1 tablet PO BID 180 tablet 3  . tamoxifen (NOLVADEX) 20 MG tablet Take 1 tablet (20 mg total) by mouth daily. 90 tablet 6   No current facility-administered medications for this visit.     OBJECTIVE: Middle-aged white woman In no acute distress Vitals:   12/29/15 1421  BP: (!) 127/56  Pulse: 79  Resp: 18  Temp: 99 F (37.2 C)     Body mass index is 40.3 kg/m.    ECOG FS: 0 Filed Weights   12/29/15 1421  Weight: 236 lb 9.6 oz (107.3 kg)   Sclerae unicteric, pupils round and equal Oropharynx clear and moist-- no thrush or other lesions No cervical or supraclavicular adenopathy Lungs no rales or rhonchi Heart regular rate and rhythm Abd soft, nontender, positive bowel sounds MSK no focal spinal tenderness, no upper extremity  lymphedema Neuro: nonfocal, well oriented, appropriate affect Breasts: She has received lumpectomy and radiation to the right breast, with no evidence of disease recurrence. The right axilla is benign. The left breast is unremarkable.  LAB RESULTS: Lab Results  Component Value Date   WBC 5.9 12/24/2015   NEUTROABS 3.6 12/24/2015   HGB 13.8 12/24/2015   HCT 41.5 12/24/2015   MCV 97.4 12/24/2015   PLT 109 (L) 12/24/2015      Chemistry      Component Value Date/Time   NA 140 12/24/2015 1430   NA 140 12/25/2014 1529   K 4.3 12/24/2015 1430   K 4.0 12/25/2014 1529   CL 108 12/24/2015 1430   CL 106 12/29/2011 1304   CO2 27 12/24/2015 1430   CO2 23 12/25/2014 1529   BUN 12 12/24/2015 1430   BUN 10.6 12/25/2014 1529   CREATININE 1.01 (H) 12/24/2015 1430   CREATININE 0.9 12/25/2014 1529   GLU 99 08/30/2008      Component Value Date/Time   CALCIUM 9.7 12/24/2015 1430   CALCIUM 9.5 12/25/2014 1529   ALKPHOS 73 12/24/2015 1430   ALKPHOS 86 12/25/2014 1529   AST 35 12/24/2015 1430   AST 48 (H) 12/25/2014 1529   ALT 31 12/24/2015 1430   ALT 43 12/25/2014 1529   BILITOT 0.4 12/24/2015 1430   BILITOT 0.85 12/25/2014 1529      STUDIES: Mm Diag Breast Tomo Bilateral  Result Date: 12/15/2015 CLINICAL DATA:  History of malignant lumpectomy of the right breast in 2013. Followup evaluation. EXAM: 2D DIGITAL DIAGNOSTIC BILATERAL MAMMOGRAM WITH CAD AND ADJUNCT TOMO COMPARISON:  Previous exam(s). ACR Breast Density Category b: There are scattered areas of fibroglandular density. FINDINGS: There are stable scarring changes located within the subareolar right breast related to the previous lumpectomy. There is no specific evidence for recurrent tumor or developing malignancy within either breast and the breast parenchymal pattern is stable bilaterally. Mammographic images were processed with CAD. IMPRESSION: No findings worrisome for recurrent tumor or developing malignancy. RECOMMENDATION:  Bilateral diagnostic mammography in 1 year. I have discussed the findings and recommendations with the patient. Results were also provided in writing at the conclusion of the visit. If applicable, a reminder letter will be sent to the patient regarding the next appointment. BI-RADS CATEGORY  1: Negative. Electronically Signed     By: [place]  Jackson M.D.   On: 12/15/2015 09:56    ASSESSMENT: 59 y.o.  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 Restarting tamoxifen in May 2015.   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 This is very favorable.  She is tolerating tamoxifen much better and the plan will be to continue that for an additional 2 years. Given her overall excellent prognosis, I do not see any reason for Jeanelle to continue tamoxifen beyond 5 years.  Her real problem is the fact that she has no time to herself. The best solution would be for her mother to be in a situation where she would be taking care of during the day and Jailah could have a little time for herself after work and then go see her. That is not going to happen given what she tells me about her mother did the next best situation I think would be for Takiera to be  clear three 2 or 3 hour periods a week to be her time, where she can do her housework, or exercise, or go to a movie, or do something that is for her.  Otherwise I am afraid Julie-Anne is going to become sick, and up in the hospital, and her mother will end up in a nursing home.  She will be thinking about all these things. Otherwise she will see me again in one year. She knows to call for any problems that may develop that visit. MAGRINAT,GUSTAV C, MD      12/29/2015  

## 2015-12-29 ENCOUNTER — Ambulatory Visit (HOSPITAL_BASED_OUTPATIENT_CLINIC_OR_DEPARTMENT_OTHER): Payer: BLUE CROSS/BLUE SHIELD | Admitting: Oncology

## 2015-12-29 VITALS — BP 127/56 | HR 79 | Temp 99.0°F | Resp 18 | Ht 64.25 in | Wt 236.6 lb

## 2015-12-29 DIAGNOSIS — C50311 Malignant neoplasm of lower-inner quadrant of right female breast: Secondary | ICD-10-CM

## 2015-12-29 DIAGNOSIS — D696 Thrombocytopenia, unspecified: Secondary | ICD-10-CM

## 2015-12-29 DIAGNOSIS — M8580 Other specified disorders of bone density and structure, unspecified site: Secondary | ICD-10-CM

## 2015-12-29 MED ORDER — TAMOXIFEN CITRATE 20 MG PO TABS: 20.0000 mg | ORAL_TABLET | Freq: Every day | ORAL | 6 refills | Status: DC

## 2016-01-05 DIAGNOSIS — Z6841 Body Mass Index (BMI) 40.0 and over, adult: Secondary | ICD-10-CM | POA: Diagnosis not present

## 2016-01-05 DIAGNOSIS — F329 Major depressive disorder, single episode, unspecified: Secondary | ICD-10-CM | POA: Diagnosis not present

## 2016-01-05 DIAGNOSIS — N898 Other specified noninflammatory disorders of vagina: Secondary | ICD-10-CM | POA: Diagnosis not present

## 2016-01-05 DIAGNOSIS — Z23 Encounter for immunization: Secondary | ICD-10-CM | POA: Diagnosis not present

## 2016-01-05 DIAGNOSIS — Z01419 Encounter for gynecological examination (general) (routine) without abnormal findings: Secondary | ICD-10-CM | POA: Diagnosis not present

## 2016-01-08 ENCOUNTER — Other Ambulatory Visit: Payer: Self-pay | Admitting: Obstetrics

## 2016-01-08 DIAGNOSIS — M8580 Other specified disorders of bone density and structure, unspecified site: Secondary | ICD-10-CM

## 2016-01-13 DIAGNOSIS — Z131 Encounter for screening for diabetes mellitus: Secondary | ICD-10-CM | POA: Diagnosis not present

## 2016-01-13 DIAGNOSIS — Z1329 Encounter for screening for other suspected endocrine disorder: Secondary | ICD-10-CM | POA: Diagnosis not present

## 2016-01-13 DIAGNOSIS — Z0000 Encounter for general adult medical examination without abnormal findings: Secondary | ICD-10-CM | POA: Diagnosis not present

## 2016-01-13 DIAGNOSIS — Z13220 Encounter for screening for lipoid disorders: Secondary | ICD-10-CM | POA: Diagnosis not present

## 2016-01-13 DIAGNOSIS — Z130 Encounter for screening for diseases of the blood and blood-forming organs and certain disorders involving the immune mechanism: Secondary | ICD-10-CM | POA: Diagnosis not present

## 2016-01-16 ENCOUNTER — Other Ambulatory Visit: Payer: BLUE CROSS/BLUE SHIELD

## 2016-01-20 ENCOUNTER — Ambulatory Visit
Admission: RE | Admit: 2016-01-20 | Discharge: 2016-01-20 | Disposition: A | Discharge status: Home / Self Care | Payer: BLUE CROSS/BLUE SHIELD | Source: Ambulatory Visit | Attending: Obstetrics | Admitting: Obstetrics

## 2016-01-20 DIAGNOSIS — M85852 Other specified disorders of bone density and structure, left thigh: Secondary | ICD-10-CM | POA: Diagnosis not present

## 2016-01-20 DIAGNOSIS — M8580 Other specified disorders of bone density and structure, unspecified site: Secondary | ICD-10-CM

## 2016-01-20 DIAGNOSIS — E669 Obesity, unspecified: Secondary | ICD-10-CM | POA: Diagnosis not present

## 2016-01-20 DIAGNOSIS — Z780 Asymptomatic menopausal state: Secondary | ICD-10-CM | POA: Diagnosis not present

## 2016-01-20 DIAGNOSIS — Z6841 Body Mass Index (BMI) 40.0 and over, adult: Secondary | ICD-10-CM | POA: Diagnosis not present

## 2016-01-27 ENCOUNTER — Other Ambulatory Visit: Payer: Self-pay | Admitting: Family Medicine

## 2016-02-19 ENCOUNTER — Other Ambulatory Visit: Payer: Self-pay | Admitting: Family Medicine

## 2016-02-19 DIAGNOSIS — E039 Hypothyroidism, unspecified: Secondary | ICD-10-CM | POA: Diagnosis not present

## 2016-03-03 DIAGNOSIS — E669 Obesity, unspecified: Secondary | ICD-10-CM | POA: Diagnosis not present

## 2016-03-03 DIAGNOSIS — E039 Hypothyroidism, unspecified: Secondary | ICD-10-CM | POA: Diagnosis not present

## 2016-03-03 DIAGNOSIS — Z6841 Body Mass Index (BMI) 40.0 and over, adult: Secondary | ICD-10-CM | POA: Diagnosis not present

## 2016-03-17 ENCOUNTER — Telehealth: Payer: Self-pay | Admitting: Family Medicine

## 2016-03-17 MED ORDER — OSELTAMIVIR PHOSPHATE 75 MG PO CAPS: 75.0000 mg | ORAL_CAPSULE | Freq: Two times a day (BID) | ORAL | 0 refills | Status: DC

## 2016-03-17 NOTE — Telephone Encounter (Signed)
Pt's mother is in hosp with the flu along with her husband and son and would like Tamiflu called in if possible as she feels like she is starting to get it too. Per MBD ok to call in Tamiflu 75mg  bid x 5 days. Med sent to pharm and pt aware via vm.

## 2016-04-12 DIAGNOSIS — E039 Hypothyroidism, unspecified: Secondary | ICD-10-CM | POA: Diagnosis not present

## 2016-04-21 DIAGNOSIS — Z713 Dietary counseling and surveillance: Secondary | ICD-10-CM | POA: Diagnosis not present

## 2016-04-21 DIAGNOSIS — E039 Hypothyroidism, unspecified: Secondary | ICD-10-CM | POA: Diagnosis not present

## 2016-05-04 ENCOUNTER — Encounter: Payer: Self-pay | Admitting: Family Medicine

## 2016-05-04 ENCOUNTER — Other Ambulatory Visit: Payer: Self-pay | Admitting: Oncology

## 2016-05-04 ENCOUNTER — Ambulatory Visit (INDEPENDENT_AMBULATORY_CARE_PROVIDER_SITE_OTHER): Payer: BLUE CROSS/BLUE SHIELD | Admitting: Family Medicine

## 2016-05-04 VITALS — BP 124/70 | HR 78 | Temp 98.6°F | Resp 16 | Ht 64.25 in | Wt 222.0 lb

## 2016-05-04 DIAGNOSIS — J209 Acute bronchitis, unspecified: Secondary | ICD-10-CM | POA: Diagnosis not present

## 2016-05-04 MED ORDER — HYDROCODONE-HOMATROPINE 5-1.5 MG/5ML PO SYRP: 5.0000 mL | ORAL_SOLUTION | Freq: Three times a day (TID) | ORAL | 0 refills | Status: DC | PRN

## 2016-05-04 MED ORDER — DOXYCYCLINE HYCLATE 100 MG PO TABS: 100.0000 mg | ORAL_TABLET | Freq: Two times a day (BID) | ORAL | 0 refills | Status: DC

## 2016-05-04 NOTE — Progress Notes (Signed)
Subjective:    Patient ID: Amanda Hoover, female    DOB: [birth year], 61 y.o.   MRN: [id number]  HPI Patient reports a cough greater than 10 days in duration productive of green and brown sputum. She reports subjective fevers. She reports fatigue. She is also demonstrating bronchospasm and she is wheezing today on exam. She denies any hemoptysis. She denies any chest pain or pleurisy. She denies any rhinorrhea or sore throat or sinus pain Past Medical History:  Diagnosis Date  . Allergy   . 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  . Breast cancer, right ([place])    ER 0 PR0 LN neg.  . Cancer ([place])   . Chronic headaches   . Dental crowns present   . Depression    no current meds.  Marland Kitchen GERD (gastroesophageal reflux disease)   . Headache(784.0)    stress, sinus headaches  . Hiatal hernia   . Hiatal hernia   . History of colon polyps   . History of esophageal stricture    has had esophageal dilation x 1  . History of migraine   . History of radiation therapy 03/28/2012-05/17/2012   60.4 gray to right breast  . Hyperlipidemia   . Multiple environmental allergies   . Osteopenia   . Osteopenia   . Palpitations    occasional; worse with ingestion of caffeine; no cardiologist  . Rash 01/06/2012   right elbow  . Rhinitis, allergic    rhinitis   Past Surgical History:  Procedure Laterality Date  . COLONOSCOPY W/ POLYPECTOMY    . ESOPHAGOGASTRODUODENOSCOPY    . PARTIAL MASTECTOMY WITH NEEDLE LOCALIZATION AND AXILLARY SENTINEL LYMPH NODE BX  01/12/2012   Procedure: PARTIAL MASTECTOMY WITH NEEDLE LOCALIZATION AND AXILLARY SENTINEL LYMPH NODE BX;  Surgeon: Adin Hector, MD;  Location: [facility];  Service: General;  Laterality: Right;  . RE-EXCISION OF BREAST CANCER,SUPERIOR MARGINS  02/15/2012   Procedure: RE-EXCISION OF BREAST CANCER,SUPERIOR MARGINS;  Surgeon: Adin Hector, MD;  Location: [facility];   Service: General;  Laterality: Right;  Re-excision of right lumpectomy, close posterior margin.   Current Outpatient Prescriptions on File Prior to Visit  Medication Sig Dispense Refill  . buPROPion (WELLBUTRIN SR) 150 MG 12 hr tablet Take 150 mg by mouth 2 (two) times daily.    Marland Kitchen EPINEPHrine (EPIPEN) 0.3 mg/0.3 mL SOAJ injection Inject 0.3 mLs (0.3 mg total) into the muscle once. 2 Device 2  . meloxicam (MOBIC) 15 MG tablet Take 1 tablet (15 mg total) by mouth daily. 30 tablet 5  . Multiple Vitamin (MULTIVITAMIN) tablet Take 1 tablet by mouth daily.    . pantoprazole (PROTONIX) 40 MG tablet TAKE 1 TABLET BY MOUTH  TWICE A DAY 180 tablet 2  . tamoxifen (NOLVADEX) 20 MG tablet Take 1 tablet (20 mg total) by mouth daily. 90 tablet 6   No current facility-administered medications on file prior to visit.    Allergies  Allergen Reactions  . Macadamia Nut Oil Anaphylaxis  . Other Swelling    POPCORN:  SWELLING THROAT  . Peanut-Containing Drug Products Swelling and Other (See Comments)    THROAT TIGHTNESS; ALSO WALNUTS  . Tomato Swelling    SWELLING UVULA  . Adhesive [Tape] Other (See Comments)    BLISTERS  . Flour Other (See Comments)    SNEEZING  . Penicillins Hives  . Hydrocodone Hives  . Oxycodone   . Vicodin [Hydrocodone-Acetaminophen] Swelling  Pt had flushing, then itching and swelling of lips  . Eggs Or Egg-Derived Products Other (See Comments)    POSITIVE ON ALLERGY TEST, BUT EATS EGGS WITHOUT PROBLEM  . Soap Other (See Comments)    FRAGRANCES (SOAP/LOTION/PERFUME):  HEADACHE, RUNNY NOSE  . Tramadol Nausea And Vomiting   Social History   Social History  . Marital status: Married    Spouse name: N/A  . Number of children: 4  . Years of education: N/A   Occupational History  .  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 Smoking status: Former Smoker    Packs/day: 0.50    Years: 5.00    Types: Cigarettes    Quit date: 03/15/2000    . Smokeless tobacco: Never Used  . Alcohol use No  . Drug use: No     Comment: quit smoking 11 years ago  . Sexual activity: Yes    Birth control/ protection: Post-menopausal   Other Topics Concern  . Not on file   Social History Narrative  . No narrative on file      Review of Systems  All other systems reviewed and are negative.      Objective:   Physical Exam  Constitutional: She appears well-developed and well-nourished.  HENT:  Right Ear: External ear normal.  Left Ear: External ear normal.  Nose: Nose normal.  Mouth/Throat: Oropharynx is clear and moist. No oropharyngeal exudate.  Neck: Neck supple.  Cardiovascular: Normal rate, regular rhythm and normal heart sounds.   Pulmonary/Chest: Effort normal. She has wheezes.  Abdominal: Soft. Bowel sounds are normal.  Lymphadenopathy:    She has no cervical adenopathy.  Vitals reviewed.         Assessment & Plan:  Bronchitis with bronchospasm  Given doxycycline 100 mg by mouth twice a day for 10 days. Use Hycodan 1 teaspoon every 6 hours as needed for cough. If wheezing worsens, the patient can use albuterol on an as needed basis however today the wheezing is mild.

## 2016-06-17 ENCOUNTER — Encounter: Payer: Self-pay | Admitting: Family Medicine

## 2016-06-17 ENCOUNTER — Ambulatory Visit (INDEPENDENT_AMBULATORY_CARE_PROVIDER_SITE_OTHER): Payer: BLUE CROSS/BLUE SHIELD | Admitting: Family Medicine

## 2016-06-17 VITALS — BP 128/80 | HR 78 | Resp 18 | Ht 64.0 in | Wt 221.0 lb

## 2016-06-17 DIAGNOSIS — R631 Polydipsia: Secondary | ICD-10-CM

## 2016-06-17 DIAGNOSIS — F439 Reaction to severe stress, unspecified: Secondary | ICD-10-CM | POA: Diagnosis not present

## 2016-06-17 DIAGNOSIS — R42 Dizziness and giddiness: Secondary | ICD-10-CM

## 2016-06-17 MED ORDER — ALPRAZOLAM 0.25 MG PO TABS: 0.2500 mg | ORAL_TABLET | Freq: Two times a day (BID) | ORAL | 0 refills | Status: DC | PRN

## 2016-06-17 NOTE — Progress Notes (Signed)
Subjective:    Patient ID: Amanda Hoover, female    DOB: [birth year], 61 y.o.   MRN: [id number]  HPI Patient is under a lot of stress at home. Her mother is in a nursing home. The patient does not feel that her mother is being appropriately cared for at the nursing home. She works all day and then goes up there and spends all afternoon and evening with her mother. She is unable to sleep as in the morning about her mom. She feels tired all the time. She is not sleeping well. She reports dizziness with standing, excessive thirst, and dry mouth. She denies any polyuria. Her blood pressure today is slightly low for her. Wt Readings from Last 3 Encounters:  06/17/16 221 lb (100.2 kg)  05/04/16 222 lb (100.7 kg)  12/29/15 236 lb 9.6 oz (107.3 kg)   Patient has lost 15 pounds since her last office visit in October. She states that she's not eating as much and that she has very little appetite. She also reports getting migraine headaches. She has had these for years that they've become more frequent and longer lasting. Past Medical History:  Diagnosis Date  . Allergy   . 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  . Breast cancer, right ([place])    ER 0 PR0 LN neg.  . Cancer ([place])   . Chronic headaches   . Dental crowns present   . Depression    no current meds.  Marland Kitchen GERD (gastroesophageal reflux disease)   . Headache(784.0)    stress, sinus headaches  . Hiatal hernia   . Hiatal hernia   . History of colon polyps   . History of esophageal stricture    has had esophageal dilation x 1  . History of migraine   . History of radiation therapy 03/28/2012-05/17/2012   60.4 gray to right breast  . Hyperlipidemia   . Multiple environmental allergies   . Osteopenia   . Osteopenia   . Palpitations    occasional; worse with ingestion of caffeine; no cardiologist  . Rash 01/06/2012   right elbow  . Rhinitis, allergic    rhinitis   Past Surgical History:    Procedure Laterality Date  . COLONOSCOPY W/ POLYPECTOMY    . ESOPHAGOGASTRODUODENOSCOPY    . PARTIAL MASTECTOMY WITH NEEDLE LOCALIZATION AND AXILLARY SENTINEL LYMPH NODE BX  01/12/2012   Procedure: PARTIAL MASTECTOMY WITH NEEDLE LOCALIZATION AND AXILLARY SENTINEL LYMPH NODE BX;  Surgeon: Adin Hector, MD;  Location: [facility];  Service: General;  Laterality: Right;  . RE-EXCISION OF BREAST CANCER,SUPERIOR MARGINS  02/15/2012   Procedure: RE-EXCISION OF BREAST CANCER,SUPERIOR MARGINS;  Surgeon: Adin Hector, MD;  Location: [facility];  Service: General;  Laterality: Right;  Re-excision of right lumpectomy, close posterior margin.   Current Outpatient Prescriptions on File Prior to Visit  Medication Sig Dispense Refill  . buPROPion (WELLBUTRIN SR) 150 MG 12 hr tablet Take 150 mg by mouth 2 (two) times daily.    Marland Kitchen EPINEPHrine (EPIPEN) 0.3 mg/0.3 mL SOAJ injection Inject 0.3 mLs (0.3 mg total) into the muscle once. 2 Device 2  . levothyroxine (SYNTHROID, LEVOTHROID) 25 MCG tablet Take 25 mcg by mouth daily before breakfast. Pt not sure of dose    . meloxicam (MOBIC) 15 MG tablet Take 1 tablet (15 mg total) by mouth daily. 30 tablet 5  . Multiple Vitamin (MULTIVITAMIN) tablet Take 1 tablet by mouth daily.    Marland Kitchen  pantoprazole (PROTONIX) 40 MG tablet TAKE 1 TABLET BY MOUTH  TWICE A DAY 180 tablet 2  . Phentermine HCl 37.5 MG TBDP Take by mouth.    . tamoxifen (NOLVADEX) 20 MG tablet TAKE 1 TABLET BY MOUTH  EVERY DAY 90 tablet 3  . HYDROcodone-homatropine (HYCODAN) 5-1.5 MG/5ML syrup Take 5 mLs by mouth every 8 (eight) hours as needed for cough. (Patient not taking: Reported on 06/17/2016) 120 mL 0   No current facility-administered medications on file prior to visit.    Allergies  Allergen Reactions  . Macadamia Nut Oil Anaphylaxis  . Other Swelling    POPCORN:  SWELLING THROAT  . Peanut-Containing Drug Products Swelling and Other (See Comments)    THROAT  TIGHTNESS; ALSO WALNUTS  . Tomato Swelling    SWELLING UVULA  . Adhesive [Tape] Other (See Comments)    BLISTERS  . Flour Other (See Comments)    SNEEZING  . Penicillins Hives  . Hydrocodone Hives  . Oxycodone   . Vicodin [Hydrocodone-Acetaminophen] Swelling    Pt had flushing, then itching and swelling of lips  . Eggs Or Egg-Derived Products Other (See Comments)    POSITIVE ON ALLERGY TEST, BUT EATS EGGS WITHOUT PROBLEM  . Soap Other (See Comments)    FRAGRANCES (SOAP/LOTION/PERFUME):  HEADACHE, RUNNY NOSE  . Tramadol Nausea And Vomiting   Social History   Social History  . Marital status: Married    Spouse name: N/A  . Number of children: 4  . Years of education: N/A   Occupational History  .  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 Smoking status: Former Smoker    Packs/day: 0.50    Years: 5.00    Types: Cigarettes    Quit date: 03/15/2000  . Smokeless tobacco: Never Used  . Alcohol use No  . Drug use: No     Comment: quit smoking 11 years ago  . Sexual activity: Yes    Birth control/ protection: Post-menopausal   Other Topics Concern  . Not on file   Social History Narrative  . No narrative on file      Review of Systems  All other systems reviewed and are negative.      Objective:   Physical Exam  HENT:  Nose: Nose normal.  Mouth/Throat: Oropharynx is clear and moist.  Neck: No JVD present. No thyromegaly present.  Cardiovascular: Normal rate, regular rhythm and normal heart sounds.   No murmur heard. Pulmonary/Chest: Effort normal and breath sounds normal. No respiratory distress. She has no wheezes. She has no rales.  Abdominal: Soft. Bowel sounds are normal. She exhibits no distension. There is no tenderness. There is no rebound and no guarding.  Musculoskeletal: She exhibits no edema.  Lymphadenopathy:    She has no cervical adenopathy.  Vitals reviewed.  Mm are dry       Assessment & Plan:    Excessive thirst - Plan: CBC with Differential/Platelet, COMPLETE METABOLIC PANEL WITH GFR, TSH, Hemoglobin A1c  Dizziness  Stress at home  I believe the patient could be slightly dehydrated. I do not think she's drinking enough water. This could explain some of her dizziness. I recommended discontinuation of phentermine. I will recheck a TSH to ensure that adequate dose of levothyroxine. I will also check lab work to rule out diabetes as a possible cause of her excessive thirst and dizziness. However I believe the majority of her symptoms are secondary to  stress. I recommended that she try Xanax 0.25 mg by mouth every 8 hours when necessary anxiety. Also recommended that she try to rest. Patient is considering quitting work and bringing her mother home where she can care for her. I believe that the majority of her symptoms are secondary to the stress/guilt she feels having to leave her mother in a nursing home

## 2016-06-18 LAB — CBC WITH DIFFERENTIAL/PLATELET
BASOS ABS: 0 {cells}/uL (ref 0–200)
Basophils Relative: 0 %
EOS PCT: 2 %
Eosinophils Absolute: 152 cells/uL (ref 15–500)
HCT: 40.6 % (ref 35.0–45.0)
HEMOGLOBIN: 13.5 g/dL (ref 12.0–15.0)
LYMPHS ABS: 2280 {cells}/uL (ref 850–3900)
Lymphocytes Relative: 30 %
MCH: 32.9 pg (ref 27.0–33.0)
MCHC: 33.3 g/dL (ref 32.0–36.0)
MCV: 99 fL (ref 80.0–100.0)
MONOS PCT: 9 %
MPV: 11.3 fL (ref 7.5–12.5)
Monocytes Absolute: 684 cells/uL (ref 200–950)
NEUTROS ABS: 4484 {cells}/uL (ref 1500–7800)
NEUTROS PCT: 59 %
PLATELETS: 147 10*3/uL (ref 140–400)
RBC: 4.1 MIL/uL (ref 3.80–5.10)
RDW: 13.8 % (ref 11.0–15.0)
WBC: 7.6 10*3/uL (ref 3.8–10.8)

## 2016-06-18 LAB — COMPLETE METABOLIC PANEL WITH GFR
ALBUMIN: 4.2 g/dL (ref 3.6–5.1)
ALT: 31 U/L — ABNORMAL HIGH (ref 6–29)
AST: 35 U/L (ref 10–35)
Alkaline Phosphatase: 88 U/L (ref 33–130)
BUN: 13 mg/dL (ref 7–25)
CO2: 20 mmol/L (ref 20–31)
Calcium: 9.3 mg/dL (ref 8.6–10.4)
Chloride: 105 mmol/L (ref 98–110)
Creat: 0.87 mg/dL (ref 0.50–0.99)
GFR, EST NON AFRICAN AMERICAN: 72 mL/min (ref >=60)
GFR, Est African American: 83 mL/min (ref >=60)
GLUCOSE: 74 mg/dL (ref 70–99)
POTASSIUM: 4.3 mmol/L (ref 3.5–5.3)
SODIUM: 139 mmol/L (ref 135–146)
Total Bilirubin: 0.5 mg/dL (ref 0.2–1.2)
Total Protein: 6.8 g/dL (ref 6.1–8.1)

## 2016-06-18 LAB — HEMOGLOBIN A1C
Hgb A1c MFr Bld: 4.8 % (ref <5.7)
MEAN PLASMA GLUCOSE: 91 mg/dL

## 2016-06-18 LAB — TSH: TSH: 2.96 mIU/L

## 2016-07-09 DIAGNOSIS — H0014 Chalazion left upper eyelid: Secondary | ICD-10-CM | POA: Diagnosis not present

## 2016-07-21 DIAGNOSIS — Z1283 Encounter for screening for malignant neoplasm of skin: Secondary | ICD-10-CM | POA: Diagnosis not present

## 2016-07-21 DIAGNOSIS — Z85828 Personal history of other malignant neoplasm of skin: Secondary | ICD-10-CM | POA: Diagnosis not present

## 2016-07-21 DIAGNOSIS — D225 Melanocytic nevi of trunk: Secondary | ICD-10-CM | POA: Diagnosis not present

## 2016-07-21 DIAGNOSIS — L814 Other melanin hyperpigmentation: Secondary | ICD-10-CM | POA: Diagnosis not present

## 2016-07-21 DIAGNOSIS — Z08 Encounter for follow-up examination after completed treatment for malignant neoplasm: Secondary | ICD-10-CM | POA: Diagnosis not present

## 2016-07-21 DIAGNOSIS — L818 Other specified disorders of pigmentation: Secondary | ICD-10-CM | POA: Diagnosis not present

## 2016-07-30 DIAGNOSIS — H2513 Age-related nuclear cataract, bilateral: Secondary | ICD-10-CM | POA: Diagnosis not present

## 2016-07-30 DIAGNOSIS — H35363 Drusen (degenerative) of macula, bilateral: Secondary | ICD-10-CM | POA: Diagnosis not present

## 2016-07-30 DIAGNOSIS — H0014 Chalazion left upper eyelid: Secondary | ICD-10-CM | POA: Diagnosis not present

## 2016-07-30 DIAGNOSIS — H25013 Cortical age-related cataract, bilateral: Secondary | ICD-10-CM | POA: Diagnosis not present

## 2016-08-06 ENCOUNTER — Encounter: Payer: Self-pay | Admitting: Family Medicine

## 2016-08-26 ENCOUNTER — Encounter: Payer: Self-pay | Admitting: Family Medicine

## 2016-08-26 ENCOUNTER — Ambulatory Visit (INDEPENDENT_AMBULATORY_CARE_PROVIDER_SITE_OTHER): Payer: BLUE CROSS/BLUE SHIELD | Admitting: Family Medicine

## 2016-08-26 VITALS — BP 110/64 | HR 78 | Temp 98.5°F | Resp 20 | Ht 64.25 in | Wt 222.0 lb

## 2016-08-26 DIAGNOSIS — W57XXXA Bitten or stung by nonvenomous insect and other nonvenomous arthropods, initial encounter: Secondary | ICD-10-CM

## 2016-08-26 DIAGNOSIS — S30860A Insect bite (nonvenomous) of lower back and pelvis, initial encounter: Secondary | ICD-10-CM | POA: Diagnosis not present

## 2016-08-26 NOTE — Progress Notes (Signed)
Subjective:    Patient ID: Amanda Hoover, female    DOB: 11-28-1955, 61 y.o.   MRN: [id number]  HPI  Patient was bitten by tick. She found a tick on her right mid back near her bra strap below her scapula this morning. She removed the tick. In the area where the tick was feeding, she has a 4 mm erythematous papule. There is no spreading red ring. She denies any flulike symptoms. She denies any headache or fever or neck stiffness. She denies any rash Past Medical History:  Diagnosis Date  . Allergy   . 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  . Breast cancer, right ([place])    ER 0 PR0 LN neg.  . Cancer ([place])   . Chronic headaches   . Dental crowns present   . Depression    no current meds.  Marland Kitchen GERD (gastroesophageal reflux disease)   . Headache(784.0)    stress, sinus headaches  . Hiatal hernia   . Hiatal hernia   . History of colon polyps   . History of esophageal stricture    has had esophageal dilation x 1  . History of migraine   . History of radiation therapy 03/28/2012-05/17/2012   60.4 gray to right breast  . Hyperlipidemia   . Multiple environmental allergies   . Osteopenia   . Osteopenia   . Palpitations    occasional; worse with ingestion of caffeine; no cardiologist  . Rash 01/06/2012   right elbow  . Rhinitis, allergic    rhinitis   Past Surgical History:  Procedure Laterality Date  . COLONOSCOPY W/ POLYPECTOMY    . ESOPHAGOGASTRODUODENOSCOPY    . PARTIAL MASTECTOMY WITH NEEDLE LOCALIZATION AND AXILLARY SENTINEL LYMPH NODE BX  01/12/2012   Procedure: PARTIAL MASTECTOMY WITH NEEDLE LOCALIZATION AND AXILLARY SENTINEL LYMPH NODE BX;  Surgeon: Adin Hector, MD;  Location: [facility];  Service: General;  Laterality: Right;  . RE-EXCISION OF BREAST CANCER,SUPERIOR MARGINS  02/15/2012   Procedure: RE-EXCISION OF BREAST CANCER,SUPERIOR MARGINS;  Surgeon: Adin Hector, MD;  Location: [facility];  Service: General;  Laterality: Right;  Re-excision of right lumpectomy, close posterior margin.   Current Outpatient Prescriptions on File Prior to Visit  Medication Sig Dispense Refill  . ALPRAZolam (XANAX) 0.25 MG tablet Take 1 tablet (0.25 mg total) by mouth 2 (two) times daily as needed for anxiety. 20 tablet 0  . buPROPion (WELLBUTRIN SR) 150 MG 12 hr tablet Take 150 mg by mouth 2 (two) times daily.    Marland Kitchen EPINEPHrine (EPIPEN) 0.3 mg/0.3 mL SOAJ injection Inject 0.3 mLs (0.3 mg total) into the muscle once. 2 Device 2  . levothyroxine (SYNTHROID, LEVOTHROID) 25 MCG tablet Take 25 mcg by mouth daily before breakfast. Pt not sure of dose    . meloxicam (MOBIC) 15 MG tablet Take 1 tablet (15 mg total) by mouth daily. 30 tablet 5  . Multiple Vitamin (MULTIVITAMIN) tablet Take 1 tablet by mouth daily.    . pantoprazole (PROTONIX) 40 MG tablet TAKE 1 TABLET BY MOUTH  TWICE A DAY 180 tablet 2  . tamoxifen (NOLVADEX) 20 MG tablet TAKE 1 TABLET BY MOUTH  EVERY DAY 90 tablet 3   No current facility-administered medications on file prior to visit.    Allergies  Allergen Reactions  . Macadamia Nut Oil Anaphylaxis  . Other Swelling    POPCORN:  SWELLING THROAT  . Peanut-Containing Drug Products Swelling and  Other (See Comments)    THROAT TIGHTNESS; ALSO WALNUTS  . Tomato Swelling    SWELLING UVULA  . Adhesive [Tape] Other (See Comments)    BLISTERS  . Flour Other (See Comments)    SNEEZING  . Penicillins Hives  . Hydrocodone Hives  . Oxycodone   . Vicodin [Hydrocodone-Acetaminophen] Swelling    Pt had flushing, then itching and swelling of lips  . Eggs Or Egg-Derived Products Other (See Comments)    POSITIVE ON ALLERGY TEST, BUT EATS EGGS WITHOUT PROBLEM  . Soap Other (See Comments)    FRAGRANCES (SOAP/LOTION/PERFUME):  HEADACHE, RUNNY NOSE  . Tramadol Nausea And Vomiting   Social History   Social History  . Marital status: Married    Spouse name: N/A  . Number of children:  4  . Years of education: N/A   Occupational History  .  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 Smoking status: Former Smoker    Packs/day: 0.50    Years: 5.00    Types: Cigarettes    Quit date: 03/15/2000  . Smokeless tobacco: Never Used  . Alcohol use No  . Drug use: No     Comment: quit smoking 11 years ago  . Sexual activity: Yes    Birth control/ protection: Post-menopausal   Other Topics Concern  . Not on file   Social History Narrative  . No narrative on file     Review of Systems  All other systems reviewed and are negative.      Objective:   Physical Exam  Neck: Neck supple.  Cardiovascular: Normal rate, regular rhythm and normal heart sounds.   No murmur heard. Pulmonary/Chest: Effort normal and breath sounds normal. No respiratory distress. She has no wheezes. She has no rales.  Abdominal: Soft. Bowel sounds are normal. She exhibits no distension. There is no tenderness.  Lymphadenopathy:    She has no cervical adenopathy.  Skin: No rash noted. No erythema.  Vitals reviewed.         Assessment & Plan:  Tick bite, initial encounter  Explained to the patient that no treatment is necessary at this time. It is too early to do any lab work to evaluate for tickborne illness given the fact the lab work assistance for serologic conversion of antibodies and she just remove the tick this morning. Furthermore she has no signs or symptoms of Lyme disease or Rocky minus spotted fever. Therefore we need to monitor this area closely over the next 7-10 days. She develops a spreading red ring, I will treat the patient for Lyme disease. If she develops flulike symptoms, I will treat the patient for [facility] spotted fever. If she develops no symptoms, no further treatment is necessary. Patient expresses understanding and will notify me immediately if symptoms develop

## 2016-09-21 ENCOUNTER — Encounter: Payer: Self-pay | Admitting: Family Medicine

## 2016-10-02 ENCOUNTER — Other Ambulatory Visit: Payer: Self-pay | Admitting: Family Medicine

## 2016-10-21 ENCOUNTER — Encounter: Payer: Self-pay | Admitting: Family Medicine

## 2016-11-18 ENCOUNTER — Encounter: Payer: Self-pay | Admitting: Family Medicine

## 2016-11-22 ENCOUNTER — Other Ambulatory Visit: Payer: Self-pay | Admitting: Oncology

## 2016-11-22 DIAGNOSIS — Z853 Personal history of malignant neoplasm of breast: Secondary | ICD-10-CM

## 2016-11-24 ENCOUNTER — Other Ambulatory Visit: Payer: Self-pay | Admitting: Family Medicine

## 2016-11-24 NOTE — Telephone Encounter (Signed)
Ok to refill??  Last office visit 08/26/2016.  Last refill 06/17/2016.

## 2016-11-25 NOTE — Telephone Encounter (Signed)
Medication called to pharmacy. 

## 2016-11-25 NOTE — Telephone Encounter (Signed)
ok 

## 2016-12-16 ENCOUNTER — Ambulatory Visit
Admission: RE | Admit: 2016-12-16 | Discharge: 2016-12-16 | Disposition: A | Discharge status: Home / Self Care | Payer: BLUE CROSS/BLUE SHIELD | Source: Ambulatory Visit | Attending: Oncology | Admitting: Oncology

## 2016-12-16 DIAGNOSIS — Z853 Personal history of malignant neoplasm of breast: Secondary | ICD-10-CM

## 2016-12-16 DIAGNOSIS — R928 Other abnormal and inconclusive findings on diagnostic imaging of breast: Secondary | ICD-10-CM | POA: Diagnosis not present

## 2016-12-16 HISTORY — DX: Personal history of irradiation: Z92.3

## 2016-12-20 ENCOUNTER — Encounter: Payer: Self-pay | Admitting: Family Medicine

## 2016-12-24 NOTE — Progress Notes (Signed)
[facility]  [phone] [phone]   ID: KAZANDRA FORSTROM   DOB: 1955/08/28  MR#: [id number]  XGU#:[id number]  PCP: Donita Brooks, MD GYN: De Hollingshead MD SU: Claud Kelp MD OTHER MD: Antony Blackbird, MD;  Claudette Head. MD;  Osborn Coho, MD    CHIEF COMPLAINT:  Right Breast Cancer  CURRENT TREATMENT: Tamoxifen   HISTORY OF PRESENT ILLNESS: From the original intake note:  Antwonette had routine bilateral screening mammography 12/02/2011 showing heterogeneously dense breasts. New calcifications were noted in the right breast, and were further evaluated with right diagnostic mammography 12/10/2011. This confirmed a group of pleomorphic microcalcifications in the lower inner quadrant of the right breast. Biopsy of this area was obtained 12/17/2011, and showed ([id number]) ductal carcinoma in situ, with areas suspicious for microinvasion, the in situ component being high-grade, estrogen and progesterone receptor negative. On 12/24/2011 the patient underwent bilateral breast MRI. This showed post biopsy changes in the right lower inner quadrant, associated with an area of clumped nodular enhancement measuring up to 3.7 cm.  The patient's subsequent history is as detailed below.  INTERVAL HISTORY: Sua returns today for followup of her estrogen receptor positive breast cancer. She continues on tamoxifen and she tolerates it well. She no longer experiences hot flashes and she denies vaginal wetness. She is able to get tamoxifen through the mail at a good price.   REVIEW OF SYSTEMS: Jewelia reports that she is not exercising at all because she is always at the nursing home with her mother, who is not doing well. She is thinking of quitting her job so she is able to take care of her mother at home. She denies unusual headaches, visual changes, nausea, vomiting, or dizziness. There has been no unusual cough, phlegm production, or pleurisy. This been no  change in bowel or bladder habits. She denies unexplained fatigue or unexplained weight loss, bleeding, rash, or fever. A detailed review of systems was otherwise entirely negative.   PAST MEDICAL HISTORY: Past Medical History:  Diagnosis Date  . Allergy   . Breast cancer (HCC) 12/17/2011    bx=Ductal carcinoma w/calcifications,ER/PR= neg.  . Breast cancer, right (HCC)    ER 0 PR0 LN neg.  . Cancer (HCC)   . Chronic headaches   . Dental crowns present   . Depression    no current meds.  Marland Kitchen GERD (gastroesophageal reflux disease)   . Headache(784.0)    stress, sinus headaches  . Hiatal hernia   . Hiatal hernia   . History of colon polyps   . History of esophageal stricture    has had esophageal dilation x 1  . History of migraine   . History of radiation therapy 03/28/2012-05/17/2012   60.4 gray to right breast  . Hyperlipidemia   . Multiple environmental allergies   . Osteopenia   . Osteopenia   . Palpitations    occasional; worse with ingestion of caffeine; no cardiologist  . Personal history of radiation therapy 2013   Right Breast Cancer  . Rash 01/06/2012   right elbow  . Rhinitis, allergic    rhinitis    PAST SURGICAL HISTORY: Past Surgical History:  Procedure Laterality Date  . BREAST LUMPECTOMY Right 2013  . COLONOSCOPY W/ POLYPECTOMY    . ESOPHAGOGASTRODUODENOSCOPY    . PARTIAL MASTECTOMY WITH NEEDLE LOCALIZATION AND AXILLARY SENTINEL LYMPH NODE BX  01/12/2012   Procedure: PARTIAL MASTECTOMY WITH NEEDLE LOCALIZATION AND AXILLARY SENTINEL LYMPH NODE BX;  Surgeon: Angelia Mould  Dalbert Batman, MD;  Location: [place];  Service: General;  Laterality: Right;  . RE-EXCISION OF BREAST CANCER,SUPERIOR MARGINS  02/15/2012   Procedure: RE-EXCISION OF BREAST CANCER,SUPERIOR MARGINS;  Surgeon: Adin Hector, MD;  Location: [place];  Service: General;  Laterality: Right;  Re-excision of right lumpectomy, close posterior margin.    FAMILY  HISTORY Family History  Problem Relation Age of Onset  . Crohn's disease Mother   . Irritable bowel syndrome Mother   . Esophageal cancer Father 51  . Breast cancer Paternal Aunt        dx in her early 23s  . Diabetes Maternal Grandmother   . Breast cancer Paternal Grandmother 63  . Diabetes Paternal Grandmother   . Cancer Maternal Uncle        unknown form of cancer  . Melanoma Cousin        maternal cousin dx in her 63s  . Breast cancer Unknown   . Liver disease Unknown    [name] died of cancer of the esophagus at age 63 He had been diagnosed with this shortly before. [name] is currently 63 years old (as of October 2013). [name] was diagnosed with breast cancer at the age of 63 [name] was also diagnosed with breast cancer, at the age of 63 [name] (her paternal grandmothers only sister) was diagnosed with breast cancer in her 63s The patient had one brother who died from a viral illness at the age of 43. There is no history of ovarian cancer in the family.  GYNECOLOGIC HISTORY:  (Reviewed 07/26/2013)  Menarche age 61, last menstrual period at age 23. The patient took hormone replacement for approximately one year. She is GX P4, first live birth age 61  SOCIAL HISTORY: (updated  07/26/2013 ) Nadirah is working in [employer] at [employer] currently, primarily [employer]. Her husband Marguerite Olea works in the [employer]. Son, Vivia Budge is an Clinical biochemist in [place]. Son, Hartley Barefoot lives in [place] where he works as a Restaurant manager, fast food. Daughter, Arcelia Jew works as a Scientist, clinical (histocompatibility and immunogenetics) in [employer]. The patient has 4 grandchildren. She attends a [employer].    ADVANCED DIRECTIVES: in place  HEALTH MAINTENANCE: (Updated 07/26/2013) Social History  Substance Use Topics  . Smoking status: Former Smoker    Packs/day: 0.50    Years: 5.00    Types: Cigarettes    Quit  date: 03/15/2000  . Smokeless tobacco: Never Used  . Alcohol use No     Colonoscopy: 2013/Dr. Fuller Plan  PAP: 2012/ Dr. Pamala Hurry  Bone density: 01/23/2013, osteopenia, T score is -1.7  Lipid panel: December 2014/ Dr. Dennard Schaumann    Allergies  Allergen Reactions  . Macadamia Nut Oil Anaphylaxis  . Other Swelling    POPCORN:  SWELLING THROAT  . Peanut-Containing Drug Products Swelling and Other (See Comments)    THROAT TIGHTNESS; ALSO WALNUTS  . Tomato Swelling    SWELLING UVULA  . Adhesive [Tape] Other (See Comments)    BLISTERS  . Flour Other (See Comments)    SNEEZING  . Penicillins Hives  . Hydrocodone Hives  . Oxycodone   . Vicodin [Hydrocodone-Acetaminophen] Swelling    Pt had flushing, then itching and swelling of lips  . Eggs Or Egg-Derived Products Other (See Comments)    POSITIVE ON ALLERGY TEST, BUT EATS EGGS WITHOUT PROBLEM  . Soap Other (See Comments)    FRAGRANCES (SOAP/LOTION/PERFUME):  HEADACHE, RUNNY NOSE  . Tramadol Nausea And Vomiting    Current Outpatient Prescriptions  Medication Sig Dispense Refill  . ALPRAZolam (XANAX) 0.25 MG tablet TAKE 1 TABLET BY MOUTH TWICE A DAY AS NEEDED FOR ANXIETY 20 tablet 0  . buPROPion (WELLBUTRIN XL) 150 MG 24 hr tablet TAKE 1 TABLET BY MOUTH  EVERY DAY 60 tablet 11  . EPINEPHrine (EPIPEN) 0.3 mg/0.3 mL SOAJ injection Inject 0.3 mLs (0.3 mg total) into the muscle once. 2 Device 2  . levothyroxine (SYNTHROID, LEVOTHROID) 25 MCG tablet Take 25 mcg by mouth daily before breakfast. Pt not sure of dose    . Multiple Vitamin (MULTIVITAMIN) tablet Take 1 tablet by mouth daily.    . pantoprazole (PROTONIX) 40 MG tablet TAKE 1 TABLET BY MOUTH  TWICE A DAY 180 tablet 2  . tamoxifen (NOLVADEX) 20 MG tablet TAKE 1 TABLET BY MOUTH  EVERY DAY 90 tablet 3   No current facility-administered medications for this visit.     OBJECTIVE: Middle-aged white woman Who appears well  Vitals:   12/28/16 1335  BP: (!) 98/56  Pulse: 67  Resp: 20   Temp: 98.1 F (36.7 C)  SpO2: 99%     Body mass index is 39.26 kg/m.    ECOG FS: 0 Filed Weights   12/28/16 1335  Weight: 230 lb 8 oz (104.6 kg)   Sclerae unicteric, EOMs intact Oropharynx clear and moist No cervical or supraclavicular adenopathy Lungs no rales or rhonchi Heart regular rate and rhythm Abd soft, nontender, positive bowel sounds MSK no focal spinal tenderness, no upper extremity lymphedema Neuro: nonfocal, well oriented, appropriate affect Breasts: The right breast is status post lumpectomy and radiation. There is no evidence of local recurrence. The left breast is benign. Both axillae are benign.  LAB RESULTS: Lab Results  Component Value Date   WBC 5.7 12/28/2016   NEUTROABS 3.5 12/28/2016   HGB 13.1 12/28/2016   HCT 39.0 12/28/2016   MCV 96.9 12/28/2016   PLT 116 (L) 12/28/2016      Chemistry      Component Value Date/Time   NA 139 06/17/2016 1439   NA 140 12/25/2014 1529   K 4.3 06/17/2016 1439   K 4.0 12/25/2014 1529   CL 105 06/17/2016 1439   CL 106 12/29/2011 1304   CO2 20 06/17/2016 1439   CO2 23 12/25/2014 1529   BUN 13 06/17/2016 1439   BUN 10.6 12/25/2014 1529   CREATININE 0.87 06/17/2016 1439   CREATININE 0.9 12/25/2014 1529   GLU 99 08/30/2008      Component Value Date/Time   CALCIUM 9.3 06/17/2016 1439   CALCIUM 9.5 12/25/2014 1529   ALKPHOS 88 06/17/2016 1439   ALKPHOS 86 12/25/2014 1529   AST 35 06/17/2016 1439   AST 48 (H) 12/25/2014 1529   ALT 31 (H) 06/17/2016 1439   ALT 43 12/25/2014 1529   BILITOT 0.5 06/17/2016 1439   BILITOT 0.85 12/25/2014 1529      STUDIES: Mm Diag Breast Tomo Bilateral, 12/16/2016, breast density category B, showed no evidence of malignancy with lumpectomy changes to the right breast.  ASSESSMENT: 61 y.o.  BRCA 1 and 2 negative [employer] woman   (1)  status post right lower inner quadrant lumpectomy and sentinel lymph node sampling 01/12/2012 showing microinvasive ductal carcinoma (< 1 mm)  in the setting of high-grade ductal carcinoma in situ, estrogen and progesterone receptor negative, with a final stage pT1a pN0 or stage IA  (2) additional  right breast surgery for margin clearance 02/15/2012 showed no residual tumor  (3)  status post adjuvant radiation therapy, completed March 2014  (4)  Began tamoxifen in November 2014 and continued until approximately January 2015 when she discontinued the drug due to hot flashes. Restarting tamoxifen in May 2015.   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 This is very favorable.  She still lacks one year of tamoxifen. She is tolerated it well. I went ahead and rewrote a prescription for her. She can stop it as of 01/13/2018  The only residual concern is her low platelet count. This is above 100,000 so not symptomatic. The real question is why is at low. Most likely it is chronic ITP and requires no intervention. However it could be due to splenomegaly, and she could have cryptogenic cirrhosis.  I am going to go ahead and get her an ultrasound of the abdomen to evaluate for cirrhosis and to measure her spleen. Assuming this is negative, then her diagnosis will be chronic ITP.  Chronic ITP generally requires no intervention unless the platelet count falls below 50,000 or bleeding develops.  At this point I'm comfortable releasing her to her primary care physician's follow-up. She will need yearly breast exam which she likely will obtain surgical gynecologist and of course yearly mammography  Katelind knows that I will be glad to see her at any point in the future if on when the need arises but as of now we are making no further routine appointment for her here.  Magrinat, Virgie Dad, MD  12/28/16 1:45 PM Medical Oncology and Hematology [facility] [address] [place], [place] [address] Tel. [phone]    Fax. [phone]  This document serves as a record of services personally performed by Chauncey Cruel, MD. It was created on her behalf by Margit Banda, a trained medical scribe. The creation of this record is based on the scribe's personal observations and the provider's statements to them. This document has been checked and approved by the attending provider.

## 2016-12-27 ENCOUNTER — Other Ambulatory Visit: Payer: Self-pay

## 2016-12-27 DIAGNOSIS — C50311 Malignant neoplasm of lower-inner quadrant of right female breast: Secondary | ICD-10-CM

## 2016-12-28 ENCOUNTER — Ambulatory Visit (HOSPITAL_BASED_OUTPATIENT_CLINIC_OR_DEPARTMENT_OTHER): Payer: BLUE CROSS/BLUE SHIELD | Admitting: Oncology

## 2016-12-28 ENCOUNTER — Telehealth: Payer: Self-pay | Admitting: Oncology

## 2016-12-28 ENCOUNTER — Other Ambulatory Visit (HOSPITAL_BASED_OUTPATIENT_CLINIC_OR_DEPARTMENT_OTHER): Payer: BLUE CROSS/BLUE SHIELD

## 2016-12-28 VITALS — BP 98/56 | HR 67 | Temp 98.1°F | Resp 20 | Ht 64.25 in | Wt 230.5 lb

## 2016-12-28 DIAGNOSIS — M8580 Other specified disorders of bone density and structure, unspecified site: Secondary | ICD-10-CM | POA: Diagnosis not present

## 2016-12-28 DIAGNOSIS — D696 Thrombocytopenia, unspecified: Secondary | ICD-10-CM | POA: Diagnosis not present

## 2016-12-28 DIAGNOSIS — Z853 Personal history of malignant neoplasm of breast: Secondary | ICD-10-CM | POA: Diagnosis not present

## 2016-12-28 DIAGNOSIS — C50311 Malignant neoplasm of lower-inner quadrant of right female breast: Secondary | ICD-10-CM

## 2016-12-28 LAB — CBC WITH DIFFERENTIAL/PLATELET
BASO%: 0.8 % (ref 0.0–2.0)
Basophils Absolute: 0 10*3/uL (ref 0.0–0.1)
EOS%: 1.9 % (ref 0.0–7.0)
Eosinophils Absolute: 0.1 10*3/uL (ref 0.0–0.5)
HEMATOCRIT: 39 % (ref 34.8–46.6)
HEMOGLOBIN: 13.1 g/dL (ref 11.6–15.9)
LYMPH#: 1.6 10*3/uL (ref 0.9–3.3)
LYMPH%: 27.8 % (ref 14.0–49.7)
MCH: 32.6 pg (ref 25.1–34.0)
MCHC: 33.6 g/dL (ref 31.5–36.0)
MCV: 96.9 fL (ref 79.5–101.0)
MONO#: 0.5 10*3/uL (ref 0.1–0.9)
MONO%: 8.2 % (ref 0.0–14.0)
NEUT#: 3.5 10*3/uL (ref 1.5–6.5)
NEUT%: 61.3 % (ref 38.4–76.8)
PLATELETS: 116 10*3/uL — AB (ref 145–400)
RBC: 4.02 10*6/uL (ref 3.70–5.45)
RDW: 13.9 % (ref 11.2–14.5)
WBC: 5.7 10*3/uL (ref 3.9–10.3)

## 2016-12-28 LAB — COMPREHENSIVE METABOLIC PANEL
ALBUMIN: 4 g/dL (ref 3.5–5.0)
ALK PHOS: 90 U/L (ref 40–150)
ALT: 34 U/L (ref 0–55)
AST: 36 U/L — AB (ref 5–34)
Anion Gap: 10 mEq/L (ref 3–11)
BILIRUBIN TOTAL: 0.61 mg/dL (ref 0.20–1.20)
BUN: 11.6 mg/dL (ref 7.0–26.0)
CO2: 24 mEq/L (ref 22–29)
CREATININE: 1 mg/dL (ref 0.6–1.1)
Calcium: 9.7 mg/dL (ref 8.4–10.4)
Chloride: 106 mEq/L (ref 98–109)
EGFR: 59 mL/min/{1.73_m2} — ABNORMAL LOW (ref >60)
Glucose: 84 mg/dl (ref 70–140)
POTASSIUM: 4.9 meq/L (ref 3.5–5.1)
Sodium: 141 mEq/L (ref 136–145)
TOTAL PROTEIN: 7.2 g/dL (ref 6.4–8.3)

## 2016-12-28 LAB — DRAW EXTRA CLOT TUBE

## 2016-12-28 NOTE — Telephone Encounter (Signed)
No additional appts per 10/16 los - [place] to be scheduled by central radiology.

## 2017-01-04 ENCOUNTER — Ambulatory Visit (HOSPITAL_COMMUNITY): Payer: BLUE CROSS/BLUE SHIELD

## 2017-01-06 ENCOUNTER — Encounter: Payer: Self-pay | Admitting: Oncology

## 2017-01-06 ENCOUNTER — Ambulatory Visit (HOSPITAL_COMMUNITY)
Admission: RE | Admit: 2017-01-06 | Discharge: 2017-01-06 | Disposition: A | Discharge status: Home / Self Care | Payer: BLUE CROSS/BLUE SHIELD | Source: Ambulatory Visit | Attending: Oncology | Admitting: Oncology

## 2017-01-06 ENCOUNTER — Other Ambulatory Visit: Payer: Self-pay | Admitting: Oncology

## 2017-01-06 DIAGNOSIS — C50311 Malignant neoplasm of lower-inner quadrant of right female breast: Secondary | ICD-10-CM | POA: Diagnosis not present

## 2017-01-06 DIAGNOSIS — K7689 Other specified diseases of liver: Secondary | ICD-10-CM | POA: Diagnosis not present

## 2017-01-06 DIAGNOSIS — R932 Abnormal findings on diagnostic imaging of liver and biliary tract: Secondary | ICD-10-CM | POA: Diagnosis not present

## 2017-01-06 DIAGNOSIS — D696 Thrombocytopenia, unspecified: Secondary | ICD-10-CM | POA: Insufficient documentation

## 2017-01-10 ENCOUNTER — Encounter: Payer: Self-pay | Admitting: Gastroenterology

## 2017-01-10 ENCOUNTER — Other Ambulatory Visit: Payer: Self-pay | Admitting: Family Medicine

## 2017-01-10 NOTE — Telephone Encounter (Signed)
Medication refilled per protocol. 

## 2017-01-12 ENCOUNTER — Ambulatory Visit (INDEPENDENT_AMBULATORY_CARE_PROVIDER_SITE_OTHER): Payer: BLUE CROSS/BLUE SHIELD | Admitting: Family Medicine

## 2017-01-12 DIAGNOSIS — Z23 Encounter for immunization: Secondary | ICD-10-CM

## 2017-01-14 DIAGNOSIS — Z1159 Encounter for screening for other viral diseases: Secondary | ICD-10-CM | POA: Diagnosis not present

## 2017-01-14 DIAGNOSIS — E039 Hypothyroidism, unspecified: Secondary | ICD-10-CM | POA: Diagnosis not present

## 2017-01-14 DIAGNOSIS — Z0000 Encounter for general adult medical examination without abnormal findings: Secondary | ICD-10-CM | POA: Diagnosis not present

## 2017-01-14 DIAGNOSIS — Z01411 Encounter for gynecological examination (general) (routine) with abnormal findings: Secondary | ICD-10-CM | POA: Diagnosis not present

## 2017-01-14 DIAGNOSIS — Z6839 Body mass index (BMI) 39.0-39.9, adult: Secondary | ICD-10-CM | POA: Diagnosis not present

## 2017-01-14 DIAGNOSIS — Z1151 Encounter for screening for human papillomavirus (HPV): Secondary | ICD-10-CM | POA: Diagnosis not present

## 2017-01-14 DIAGNOSIS — F329 Major depressive disorder, single episode, unspecified: Secondary | ICD-10-CM | POA: Diagnosis not present

## 2017-01-14 DIAGNOSIS — R3915 Urgency of urination: Secondary | ICD-10-CM | POA: Diagnosis not present

## 2017-01-14 DIAGNOSIS — Z131 Encounter for screening for diabetes mellitus: Secondary | ICD-10-CM | POA: Diagnosis not present

## 2017-01-14 DIAGNOSIS — Z01419 Encounter for gynecological examination (general) (routine) without abnormal findings: Secondary | ICD-10-CM | POA: Diagnosis not present

## 2017-01-17 ENCOUNTER — Encounter: Payer: Self-pay | Admitting: Family Medicine

## 2017-01-18 ENCOUNTER — Encounter: Payer: Self-pay | Admitting: Family Medicine

## 2017-01-18 DIAGNOSIS — D693 Immune thrombocytopenic purpura: Secondary | ICD-10-CM | POA: Insufficient documentation

## 2017-02-07 ENCOUNTER — Ambulatory Visit (INDEPENDENT_AMBULATORY_CARE_PROVIDER_SITE_OTHER): Payer: BLUE CROSS/BLUE SHIELD | Admitting: Physician Assistant

## 2017-02-07 ENCOUNTER — Encounter: Payer: Self-pay | Admitting: Physician Assistant

## 2017-02-07 ENCOUNTER — Other Ambulatory Visit: Payer: Self-pay

## 2017-02-07 VITALS — BP 120/78 | HR 83 | Temp 98.1°F | Resp 14 | Ht 64.25 in | Wt 230.6 lb

## 2017-02-07 DIAGNOSIS — F4321 Adjustment disorder with depressed mood: Secondary | ICD-10-CM | POA: Diagnosis not present

## 2017-02-07 DIAGNOSIS — J22 Unspecified acute lower respiratory infection: Secondary | ICD-10-CM

## 2017-02-07 MED ORDER — AZITHROMYCIN 250 MG PO TABS: ORAL_TABLET | ORAL | 0 refills | Status: DC

## 2017-02-07 MED ORDER — ALPRAZOLAM 0.25 MG PO TABS: ORAL_TABLET | ORAL | 0 refills | Status: DC
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
## 2017-02-28 ENCOUNTER — Encounter: Payer: Self-pay | Admitting: Gastroenterology

## 2017-02-28 ENCOUNTER — Ambulatory Visit (INDEPENDENT_AMBULATORY_CARE_PROVIDER_SITE_OTHER): Payer: BLUE CROSS/BLUE SHIELD | Admitting: Gastroenterology

## 2017-02-28 VITALS — BP 112/76 | HR 72 | Ht 64.5 in | Wt 232.4 lb

## 2017-02-28 DIAGNOSIS — Z6841 Body Mass Index (BMI) 40.0 and over, adult: Secondary | ICD-10-CM | POA: Diagnosis not present

## 2017-02-28 DIAGNOSIS — K219 Gastro-esophageal reflux disease without esophagitis: Secondary | ICD-10-CM | POA: Diagnosis not present

## 2017-02-28 DIAGNOSIS — E039 Hypothyroidism, unspecified: Secondary | ICD-10-CM | POA: Diagnosis not present

## 2017-02-28 DIAGNOSIS — Z86010 Personal history of colonic polyps: Secondary | ICD-10-CM

## 2017-02-28 DIAGNOSIS — C50211 Malignant neoplasm of upper-inner quadrant of right female breast: Secondary | ICD-10-CM | POA: Diagnosis not present

## 2017-02-28 DIAGNOSIS — F329 Major depressive disorder, single episode, unspecified: Secondary | ICD-10-CM | POA: Diagnosis not present

## 2017-02-28 MED ORDER — NA SULFATE-K SULFATE-MG SULF 17.5-3.13-1.6 GM/177ML PO SOLN: 1.0000 | ORAL | 0 refills | Status: DC

## 2017-02-28 NOTE — Progress Notes (Signed)
History of Present Illness: This is a 61 year old female here for the evaluation of history of a serrated adenoma and GERD. Reflux under very good control on Protonix daily.  She was previously taking pantoprazole twice daily however since modifying her diet and avoiding food intake late in the evening her reflux symptoms have come under very good control and symptoms are controlled on once daily Protonix.  She has no other gastrointestinal complaints.  We sent a letter to return for surveillance colonoscopy in October however she  states that she did not receive it.  Her primary care provider reminded her about her colonoscopy date. Denies weight loss, abdominal pain, constipation, diarrhea, change in stool caliber, melena, hematochezia, nausea, vomiting, dysphagia, chest pain.   Allergies  Allergen Reactions  . Macadamia Nut Oil Anaphylaxis  . Other Swelling    POPCORN:  SWELLING THROAT  . Peanut-Containing Drug Products Swelling and Other (See Comments)    THROAT TIGHTNESS; ALSO WALNUTS  . Tomato Swelling    SWELLING UVULA  . Adhesive [Tape] Other (See Comments)    BLISTERS  . Flour Other (See Comments)    SNEEZING  . Penicillins Hives  . Hydrocodone Hives  . Oxycodone   . Sulfa Antibiotics Nausea Only  . Vicodin [Hydrocodone-Acetaminophen] Swelling    Pt had flushing, then itching and swelling of lips  . Eggs Or Egg-Derived Products Other (See Comments)    POSITIVE ON ALLERGY TEST, BUT EATS EGGS WITHOUT PROBLEM  . Soap Other (See Comments)    FRAGRANCES (SOAP/LOTION/PERFUME):  HEADACHE, RUNNY NOSE  . Tramadol Nausea And Vomiting   Outpatient Medications Prior to Visit  Medication Sig Dispense Refill  . ALPRAZolam (XANAX) 0.25 MG tablet TAKE 1 TABLET BY MOUTH TWICE A DAY AS NEEDED FOR ANXIETY 60 tablet 0  . buPROPion (WELLBUTRIN XL) 150 MG 24 hr tablet TAKE 1 TABLET BY MOUTH  EVERY DAY (Patient taking differently: TAKE 2 TABLETs BY MOUTH  EVERY DAY) 60 tablet 11  .  EPINEPHrine (EPIPEN) 0.3 mg/0.3 mL SOAJ injection Inject 0.3 mLs (0.3 mg total) into the muscle once. 2 Device 2  . levothyroxine (SYNTHROID, LEVOTHROID) 25 MCG tablet Take 25 mcg by mouth daily before breakfast. Pt not sure of dose    . pantoprazole (PROTONIX) 40 MG tablet TAKE 1 TABLET BY MOUTH  TWICE A DAY (Patient taking differently: TAKE 1 TABLET BY MOUTH  once A DAY) 180 tablet 1  . tamoxifen (NOLVADEX) 20 MG tablet TAKE 1 TABLET BY MOUTH  EVERY DAY 90 tablet 3  . azithromycin (ZITHROMAX) 250 MG tablet Day 1: Take 2 daily. Days 2 -5: Take 1 daily. 6 tablet 0  . busPIRone (BUSPAR) 7.5 MG tablet Take 7.5 mg by mouth 2 (two) times daily.    . Multiple Vitamin (MULTIVITAMIN) tablet Take 1 tablet by mouth daily.     No facility-administered medications prior to visit.    Past Medical History:  Diagnosis Date  . Allergy   . 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  . Breast cancer, right ([place])    ER 0 PR0 LN neg.  . Cancer ([place])   . Chronic headaches   . Chronic ITP (idiopathic thrombocytopenia) ([facility])    mild thrombocytopenia, normal spleen on [place] 11/18, mild cirrhosis  . Dental crowns present   . Depression    no current meds.  Marland Kitchen GERD (gastroesophageal reflux disease)   . Headache(784.0)    stress, sinus headaches  .  Hiatal hernia   . Hiatal hernia   . History of colon polyps   . History of esophageal stricture    has had esophageal dilation x 1  . History of migraine   . History of radiation therapy 03/28/2012-05/17/2012   60.4 gray to right breast  . Hyperlipidemia   . Multiple environmental allergies   . Osteopenia   . Osteopenia   . Palpitations    occasional; worse with ingestion of caffeine; no cardiologist  . Personal history of radiation therapy 2013   Right Breast Cancer  . Rash 01/06/2012   right elbow  . Rhinitis, allergic    rhinitis   Past Surgical History:  Procedure Laterality Date  . BREAST LUMPECTOMY Right 2013    . COLONOSCOPY W/ POLYPECTOMY    . ESOPHAGOGASTRODUODENOSCOPY    . PARTIAL MASTECTOMY WITH NEEDLE LOCALIZATION AND AXILLARY SENTINEL LYMPH NODE BX  01/12/2012   Procedure: PARTIAL MASTECTOMY WITH NEEDLE LOCALIZATION AND AXILLARY SENTINEL LYMPH NODE BX;  Surgeon: Adin Hector, MD;  Location: [facility];  Service: General;  Laterality: Right;  . RE-EXCISION OF BREAST CANCER,SUPERIOR MARGINS  02/15/2012   Procedure: RE-EXCISION OF BREAST CANCER,SUPERIOR MARGINS;  Surgeon: Adin Hector, MD;  Location: [facility];  Service: General;  Laterality: Right;  Re-excision of right lumpectomy, close posterior margin.   Social History   Socioeconomic History  . Marital status: Married    Spouse name: None  . Number of children: 4  . Years of education: None  . Highest education level: None  Social Needs  . Financial resource strain: None  . Food insecurity - worry: None  . Food insecurity - inability: None  . Transportation needs - medical: None  . Transportation needs - non-medical: None  Occupational History    Employer: [employer]    Comment: Teacher, English as a foreign language for [employer]  Tobacco Use  . Smoking status: Former Smoker    Packs/day: 0.50    Years: 5.00    Pack years: 2.50    Types: Cigarettes    Last attempt to quit: 03/15/2000    Years since quitting: 16.9  . Smokeless tobacco: Never Used  Substance and Sexual Activity  . Alcohol use: No  . Drug use: No    Comment: quit smoking 11 years ago  . Sexual activity: Yes    Birth control/protection: Post-menopausal  Other Topics Concern  . None  Social History Narrative  . None   Family History  Problem Relation Age of Onset  . Crohn's disease Mother   . Irritable bowel syndrome Mother   . Esophageal cancer Father 24  . Breast cancer Paternal Aunt        dx in her early 73s  . Diabetes Maternal Grandmother   . Breast cancer Paternal Grandmother 55  . Diabetes Paternal Grandmother   . Cancer  Maternal Uncle        unknown form of cancer  . Melanoma Cousin        maternal cousin dx in her 25s  . Breast cancer Unknown   . Liver disease Unknown       Review of Systems: Pertinent positive and negative review of systems were noted in the above HPI section. All other review of systems were otherwise negative.   Physical Exam: General: Well developed, well nourished, no acute distress Head: Normocephalic and atraumatic Eyes:  sclerae anicteric, EOMI Ears: Normal auditory acuity Mouth: No deformity or lesions Neck: Supple, no masses or thyromegaly  Lungs: Clear throughout to auscultation Heart: Regular rate and rhythm; no murmurs, rubs or bruits Abdomen: Soft, non tender and non distended. No masses, hepatosplenomegaly or hernias noted. Normal Bowel sounds Rectal: deferred to colonoscopy Musculoskeletal: Symmetrical with no gross deformities  Skin: No lesions on visible extremities Pulses:  Normal pulses noted Extremities: No clubbing, cyanosis, edema or deformities noted Neurological: Alert oriented x 4, grossly nonfocal Cervical Nodes:  No significant cervical adenopathy Inguinal Nodes: No significant inguinal adenopathy Psychological:  Alert and cooperative. Normal mood and affect  Assessment and Recommendations:  1.  Personal history of a serrated adenoma.  The patient is due for a 5 year surveillance colonoscopy.  Schedule colonoscopy.  The risks (including bleeding, perforation, infection, missed lesions, medication reactions and possible hospitalization or surgery if complications occur), benefits, and alternatives to colonoscopy with possible biopsy and possible polypectomy were discussed with the patient and they consent to proceed.   2. GERD.  EGD performed in 2013 was unremarkable.  Patient wondered whether another EGD would be reasonable given her chronic GERD however symptoms are well controlled on her current regimen endoscopy is not indicated at this time.   Continue pantoprazole 40 mg daily and follow antireflux measures.

## 2017-02-28 NOTE — Patient Instructions (Signed)

## 2017-03-01 ENCOUNTER — Encounter: Payer: Self-pay | Admitting: Gastroenterology

## 2017-03-03 ENCOUNTER — Ambulatory Visit (AMBULATORY_SURGERY_CENTER): Payer: BLUE CROSS/BLUE SHIELD | Admitting: Gastroenterology

## 2017-03-03 ENCOUNTER — Encounter: Payer: Self-pay | Admitting: Gastroenterology

## 2017-03-03 ENCOUNTER — Other Ambulatory Visit: Payer: Self-pay

## 2017-03-03 VITALS — BP 158/73 | HR 69 | Temp 98.0°F | Resp 16 | Ht 64.5 in | Wt 232.0 lb

## 2017-03-03 DIAGNOSIS — K635 Polyp of colon: Secondary | ICD-10-CM | POA: Diagnosis not present

## 2017-03-03 DIAGNOSIS — D122 Benign neoplasm of ascending colon: Secondary | ICD-10-CM | POA: Diagnosis not present

## 2017-03-03 DIAGNOSIS — Z860100 Personal history of colon polyps, unspecified: Secondary | ICD-10-CM

## 2017-03-03 DIAGNOSIS — D123 Benign neoplasm of transverse colon: Secondary | ICD-10-CM

## 2017-03-03 DIAGNOSIS — D125 Benign neoplasm of sigmoid colon: Secondary | ICD-10-CM

## 2017-03-03 DIAGNOSIS — D124 Benign neoplasm of descending colon: Secondary | ICD-10-CM

## 2017-03-03 MED ORDER — SODIUM CHLORIDE 0.9 % IV SOLN: 500.0000 mL | Freq: Once | INTRAVENOUS | Status: DC

## 2017-03-03 NOTE — Progress Notes (Signed)
Report to PACU, RN, vss, BBS= Clear.  

## 2017-03-03 NOTE — Progress Notes (Signed)
Called to room to assist during endoscopic procedure.  Patient ID and intended procedure confirmed with present staff. Received instructions for my participation in the procedure from the performing physician.  

## 2017-03-03 NOTE — Patient Instructions (Signed)
**Handout on polyps( x5) given to patient**  YOU HAD AN ENDOSCOPIC PROCEDURE TODAY AT [facility]:   Refer to the procedure report that was given to you for any specific questions about what was found during the examination.  If the procedure report does not answer your questions, please call your gastroenterologist to clarify.  If you requested that your care partner not be given the details of your procedure findings, then the procedure report has been included in a sealed envelope for you to review at your convenience later.  YOU SHOULD EXPECT: Some feelings of bloating in the abdomen. Passage of more gas than usual.  Walking can help get rid of the air that was put into your GI tract during the procedure and reduce the bloating. If you had a lower endoscopy (such as a colonoscopy or flexible sigmoidoscopy) you may notice spotting of blood in your stool or on the toilet paper. If you underwent a bowel prep for your procedure, you may not have a normal bowel movement for a few days.  Please Note:  You might notice some irritation and congestion in your nose or some drainage.  This is from the oxygen used during your procedure.  There is no need for concern and it should clear up in a day or so.  SYMPTOMS TO REPORT IMMEDIATELY:   Following lower endoscopy (colonoscopy or flexible sigmoidoscopy):  Excessive amounts of blood in the stool  Significant tenderness or worsening of abdominal pains  Swelling of the abdomen that is new, acute  Fever of 100F or higher   Following upper endoscopy (EGD)  Vomiting of blood or coffee ground material  New chest pain or pain under the shoulder blades  Painful or persistently difficult swallowing  New shortness of breath  Fever of 100F or higher  Black, tarry-looking stools  For urgent or emergent issues, a gastroenterologist can be reached at any hour by calling [phone].   DIET:  We do recommend a small meal at first, but  then you may proceed to your regular diet.  Drink plenty of fluids but you should avoid alcoholic beverages for 24 hours.  ACTIVITY:  You should plan to take it easy for the rest of today and you should NOT DRIVE or use heavy machinery until tomorrow (because of the sedation medicines used during the test).    FOLLOW UP: Our staff will call the number listed on your records the next business day following your procedure to check on you and address any questions or concerns that you may have regarding the information given to you following your procedure. If we do not reach you, we will leave a message.  However, if you are feeling well and you are not experiencing any problems, there is no need to return our call.  We will assume that you have returned to your regular daily activities without incident.  If any biopsies were taken you will be contacted by phone or by letter within the next 1-3 weeks.  Please call us at [phone] if you have not heard about the biopsies in 3 weeks.    SIGNATURES/CONFIDENTIALITY: You and/or your care partner have signed paperwork which will be entered into your electronic medical record.  These signatures attest to the fact that that the information above on your After Visit Summary has been reviewed and is understood.  Full responsibility of the confidentiality of this discharge information lies with you and/or your care-partner.YOU HAD AN ENDOSCOPIC PROCEDURE  TODAY AT [facility]:   Refer to the procedure report that was given to you for any specific questions about what was found during the examination.  If the procedure report does not answer your questions, please call your gastroenterologist to clarify.  If you requested that your care partner not be given the details of your procedure findings, then the procedure report has been included in a sealed envelope for you to review at your convenience later.  YOU SHOULD EXPECT: Some feelings of  bloating in the abdomen. Passage of more gas than usual.  Walking can help get rid of the air that was put into your GI tract during the procedure and reduce the bloating. If you had a lower endoscopy (such as a colonoscopy or flexible sigmoidoscopy) you may notice spotting of blood in your stool or on the toilet paper. If you underwent a bowel prep for your procedure, you may not have a normal bowel movement for a few days.  Please Note:  You might notice some irritation and congestion in your nose or some drainage.  This is from the oxygen used during your procedure.  There is no need for concern and it should clear up in a day or so.  SYMPTOMS TO REPORT IMMEDIATELY:   Following lower endoscopy (colonoscopy or flexible sigmoidoscopy):  Excessive amounts of blood in the stool  Significant tenderness or worsening of abdominal pains  Swelling of the abdomen that is new, acute  Fever of 100F or higher   For urgent or emergent issues, a gastroenterologist can be reached at any hour by calling [phone].   DIET:  We do recommend a small meal at first, but then you may proceed to your regular diet.  Drink plenty of fluids but you should avoid alcoholic beverages for 24 hours.  ACTIVITY:  You should plan to take it easy for the rest of today and you should NOT DRIVE or use heavy machinery until tomorrow (because of the sedation medicines used during the test).    FOLLOW UP: Our staff will call the number listed on your records the next business day following your procedure to check on you and address any questions or concerns that you may have regarding the information given to you following your procedure. If we do not reach you, we will leave a message.  However, if you are feeling well and you are not experiencing any problems, there is no need to return our call.  We will assume that you have returned to your regular daily activities without incident.  If any biopsies were taken you will  be contacted by phone or by letter within the next 1-3 weeks.  Please call us at [phone] if you have not heard about the biopsies in 3 weeks.    SIGNATURES/CONFIDENTIALITY: You and/or your care partner have signed paperwork which will be entered into your electronic medical record.  These signatures attest to the fact that that the information above on your After Visit Summary has been reviewed and is understood.  Full responsibility of the confidentiality of this discharge information lies with you and/or your care-partner.

## 2017-03-03 NOTE — Op Note (Signed)
[facility] Patient Name: Amanda Hoover Procedure Date: 03/03/2017 11:35 AM MRN: [id number] Endoscopist: Ladene Artist , MD Age: 61 Referring MD:  Date of Birth: 1955/12/31 Gender: Female Account #: [id number] Procedure:                Colonoscopy Indications:              High risk colon cancer surveillance: Personal                            history of traditional serrated adenoma of the colon Medicines:                Monitored Anesthesia Care Procedure:                Pre-Anesthesia Assessment:                           - Prior to the procedure, a History and Physical                            was performed, and patient medications and                            allergies were reviewed. The patient's tolerance of                            previous anesthesia was also reviewed. The risks                            and benefits of the procedure and the sedation                            options and risks were discussed with the patient.                            All questions were answered, and informed consent                            was obtained. Prior Anticoagulants: The patient has                            taken no previous anticoagulant or antiplatelet                            agents. ASA Grade Assessment: II - A patient with                            mild systemic disease. After reviewing the risks                            and benefits, the patient was deemed in                            satisfactory condition to undergo the procedure.  After obtaining informed consent, the colonoscope                            was passed under direct vision. Throughout the                            procedure, the patient's blood pressure, pulse, and                            oxygen saturations were monitored continuously. The                            Colonoscope was introduced through the anus and                            advanced  to the the cecum, identified by                            appendiceal orifice and ileocecal valve. The                            ileocecal valve, appendiceal orifice, and rectum                            were photographed. The quality of the bowel                            preparation was good. The colonoscopy was performed                            without difficulty. The patient tolerated the                            procedure well. Scope In: 11:44:17 AM Scope Out: 11:59:40 AM Scope Withdrawal Time: 0 hours 13 minutes 7 seconds  Total Procedure Duration: 0 hours 15 minutes 23 seconds  Findings:                 The perianal and digital rectal examinations were                            normal.                           Four sessile polyps were found in the sigmoid                            colon, descending colon (2) and transverse colon.                            The polyps were 6 to 8 mm in size. These polyps                            were removed with a cold snare. Resection and  retrieval were complete.                           A 5 mm polyp was found in the ascending colon. The                            polyp was sessile. The polyp was removed with a                            cold biopsy forceps. Resection and retrieval were                            complete.                           Internal hemorrhoids were found during                            retroflexion. The hemorrhoids were small and Grade                            I (internal hemorrhoids that do not prolapse).                           The exam was otherwise without abnormality on                            direct and retroflexion views. Complications:            No immediate complications. Estimated blood loss:                            None. Estimated Blood Loss:     Estimated blood loss: none. Impression:               - Four 6 to 8 mm polyps in the sigmoid colon, in                             the descending colon and in the transverse colon,                            removed with a cold snare. Resected and retrieved.                           - One 5 mm polyp in the ascending colon, removed                            with a cold biopsy forceps. Resected and retrieved.                           - Internal hemorrhoids.                           - The examination was otherwise normal on direct  and retroflexion views. Recommendation:           - Repeat colonoscopy in 3 - 5 years for                            surveillance pending pathology review.                           - Patient has a contact number available for                            emergencies. The signs and symptoms of potential                            delayed complications were discussed with the                            patient. Return to normal activities tomorrow.                            Written discharge instructions were provided to the                            patient.                           - Resume previous diet.                           - Continue present medications.                           - Await pathology results. Ladene Artist, MD 03/03/2017 12:06:16 PM This report has been signed electronically.

## 2017-03-04 ENCOUNTER — Telehealth: Payer: Self-pay

## 2017-03-04 ENCOUNTER — Telehealth: Payer: Self-pay | Admitting: *Deleted

## 2017-03-04 NOTE — Telephone Encounter (Signed)
  Follow up Call-  Call back number 03/03/2017  Post procedure Call Back phone  # [phone]  Permission to leave phone message Yes  Some recent data might be hidden     Patient questions:  Do you have a fever, pain , or abdominal swelling? No. Pain Score  0 *  Have you tolerated food without any problems? Yes.    Have you been able to return to your normal activities? Yes.    Do you have any questions about your discharge instructions: Diet   No. Medications  No. Follow up visit  No.  Do you have questions or concerns about your Care? No.  Actions: * If pain score is 4 or above: No action needed, pain <4.

## 2017-03-04 NOTE — Telephone Encounter (Signed)
  Follow up Call-  Call back number 03/03/2017  Post procedure Call Back phone  # [phone]  Permission to leave phone message Yes  Some recent data might be hidden     No answer by patient.  LMOM. Angela/Recovery

## 2017-03-12 ENCOUNTER — Encounter: Payer: Self-pay | Admitting: Gastroenterology

## 2017-03-14 ENCOUNTER — Other Ambulatory Visit: Payer: Self-pay | Admitting: Oncology

## 2017-06-06 DIAGNOSIS — E039 Hypothyroidism, unspecified: Secondary | ICD-10-CM | POA: Diagnosis not present

## 2017-08-05 DIAGNOSIS — H2513 Age-related nuclear cataract, bilateral: Secondary | ICD-10-CM | POA: Diagnosis not present

## 2017-08-05 DIAGNOSIS — H524 Presbyopia: Secondary | ICD-10-CM | POA: Diagnosis not present

## 2017-08-05 DIAGNOSIS — H35363 Drusen (degenerative) of macula, bilateral: Secondary | ICD-10-CM | POA: Diagnosis not present

## 2017-08-05 DIAGNOSIS — H04123 Dry eye syndrome of bilateral lacrimal glands: Secondary | ICD-10-CM | POA: Diagnosis not present

## 2017-08-05 DIAGNOSIS — H25013 Cortical age-related cataract, bilateral: Secondary | ICD-10-CM | POA: Diagnosis not present

## 2017-08-15 ENCOUNTER — Other Ambulatory Visit: Payer: Self-pay | Admitting: Oncology

## 2017-08-26 ENCOUNTER — Encounter: Payer: Self-pay | Admitting: Family Medicine

## 2017-08-26 ENCOUNTER — Other Ambulatory Visit: Payer: Self-pay

## 2017-08-26 ENCOUNTER — Ambulatory Visit (INDEPENDENT_AMBULATORY_CARE_PROVIDER_SITE_OTHER): Payer: BLUE CROSS/BLUE SHIELD | Admitting: Family Medicine

## 2017-08-26 VITALS — BP 126/78 | HR 83 | Temp 98.0°F | Resp 16 | Wt 235.0 lb

## 2017-08-26 DIAGNOSIS — R300 Dysuria: Secondary | ICD-10-CM

## 2017-08-26 LAB — URINALYSIS, ROUTINE W REFLEX MICROSCOPIC
Bilirubin Urine: NEGATIVE
GLUCOSE, UA: NEGATIVE
HGB URINE DIPSTICK: NEGATIVE
KETONES UR: NEGATIVE
Leukocytes, UA: NEGATIVE
NITRITE: NEGATIVE
PH: 6 (ref 5.0–8.0)
Protein, ur: NEGATIVE
SPECIFIC GRAVITY, URINE: 1.002 (ref 1.001–1.03)

## 2017-08-26 MED ORDER — EPINEPHRINE 0.3 MG/0.3ML IJ SOAJ: 0.3000 mg | Freq: Once | INTRAMUSCULAR | 2 refills | Status: AC

## 2017-08-26 MED ORDER — CIPROFLOXACIN HCL 500 MG PO TABS: 500.0000 mg | ORAL_TABLET | Freq: Two times a day (BID) | ORAL | 0 refills | Status: DC

## 2017-08-26 NOTE — Progress Notes (Signed)
Subjective:    Patient ID: Amanda Hoover, female    DOB: 06/30/1955, 62 y.o.   MRN: [id number]  HPI Friday, 1 week ago, the patient developed symptoms of urinary tract infection.  Symptoms included dysuria, urgency, frequency, and hesitancy.  The office was closed.  She had some residual doxycycline remaining from a previous infection from another doctor that she started taking independently.  She took the medication for 7 days.  She has been off the medication now about 24 hours.  Up until yesterday, she was continuing to have mild dysuria, mild urgency.  She denied any back pain, abdominal pain, frequency, fever.  She denies any hematuria or foul-smelling urine.  She has been drinking tons of cranberry juice trying to flush her this to him.  Urinalysis today is unremarkable.  There is no blood.  There is no leukocyte esterase.  There are no nitrites.  Patient states she has been asymptomatic for approximately 24 hours Past Medical History:  Diagnosis Date  . Allergy   . Anxiety   . 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  . Breast cancer, right ([facility])    ER 0 PR0 LN neg.  . Cancer ([facility])   . Chronic headaches   . Chronic ITP (idiopathic thrombocytopenia) (HCC)    mild thrombocytopenia, normal spleen on [place] 11/18, mild cirrhosis  . Dental crowns present   . Depression    no current meds.  Marland Kitchen GERD (gastroesophageal reflux disease)   . Headache(784.0)    stress, sinus headaches  . Heart murmur   . Hiatal hernia   . Hiatal hernia   . History of colon polyps   . History of esophageal stricture    has had esophageal dilation x 1  . History of migraine   . History of radiation therapy 03/28/2012-05/17/2012   60.4 gray to right breast  . Hyperlipidemia   . Multiple environmental allergies   . Osteopenia   . Osteopenia   . Palpitations    occasional; worse with ingestion of caffeine; no cardiologist  . Personal history of radiation therapy 2013     Right Breast Cancer  . Rash 01/06/2012   right elbow  . Rhinitis, allergic    rhinitis  . Thyroid disease    Past Surgical History:  Procedure Laterality Date  . BREAST LUMPECTOMY Right 2013  . COLONOSCOPY    . COLONOSCOPY W/ POLYPECTOMY    . ESOPHAGOGASTRODUODENOSCOPY    . PARTIAL MASTECTOMY WITH NEEDLE LOCALIZATION AND AXILLARY SENTINEL LYMPH NODE BX  01/12/2012   Procedure: PARTIAL MASTECTOMY WITH NEEDLE LOCALIZATION AND AXILLARY SENTINEL LYMPH NODE BX;  Surgeon: Adin Hector, MD;  Location: [facility];  Service: General;  Laterality: Right;  . RE-EXCISION OF BREAST CANCER,SUPERIOR MARGINS  02/15/2012   Procedure: RE-EXCISION OF BREAST CANCER,SUPERIOR MARGINS;  Surgeon: Adin Hector, MD;  Location: [facility];  Service: General;  Laterality: Right;  Re-excision of right lumpectomy, close posterior margin.   Current Outpatient Medications on File Prior to Visit  Medication Sig Dispense Refill  . ALPRAZolam (XANAX) 0.25 MG tablet TAKE 1 TABLET BY MOUTH TWICE A DAY AS NEEDED FOR ANXIETY 60 tablet 0  . buPROPion (WELLBUTRIN XL) 150 MG 24 hr tablet TAKE 1 TABLET BY MOUTH  EVERY DAY (Patient taking differently: TAKE 2 TABLETs BY MOUTH  EVERY DAY) 60 tablet 11  . levothyroxine (SYNTHROID, LEVOTHROID) 25 MCG tablet Take 25 mcg by mouth daily before breakfast. Pt  not sure of dose    . pantoprazole (PROTONIX) 40 MG tablet TAKE 1 TABLET BY MOUTH  TWICE A DAY (Patient taking differently: TAKE 1 TABLET BY MOUTH  once A DAY) 180 tablet 1  . tamoxifen (NOLVADEX) 20 MG tablet TAKE 1 TABLET BY MOUTH  EVERY DAY 90 tablet 3  . cetirizine (ZYRTEC) 10 MG tablet Take 10 mg by mouth daily.     No current facility-administered medications on file prior to visit.    Allergies  Allergen Reactions  . Macadamia Nut Oil Anaphylaxis  . Other Swelling    POPCORN:  SWELLING THROAT  . Peanut-Containing Drug Products Swelling and Other (See Comments)    THROAT TIGHTNESS;  ALSO WALNUTS  . Tomato Swelling    SWELLING UVULA  . Adhesive [Tape] Other (See Comments)    BLISTERS  . Flour Other (See Comments)    SNEEZING  . Penicillins Hives  . Hydrocodone Hives  . Oxycodone   . Sulfa Antibiotics Nausea Only  . Vicodin [Hydrocodone-Acetaminophen] Swelling    Pt had flushing, then itching and swelling of lips  . Eggs Or Egg-Derived Products Other (See Comments)    POSITIVE ON ALLERGY TEST, BUT EATS EGGS WITHOUT PROBLEM  . Soap Other (See Comments)    FRAGRANCES (SOAP/LOTION/PERFUME):  HEADACHE, RUNNY NOSE  . Tramadol Nausea And Vomiting   Social History   Socioeconomic History  . Marital status: Married    Spouse name: Not on file  . Number of children: 4  . Years of education: Not on file  . Highest education level: Not on file  Occupational History    Employer: [employer]    Comment: Teacher, English as a foreign language for [employer]  Social Needs  . Financial resource strain: Not on file  . Food insecurity:    Worry: Not on file    Inability: Not on file  . Transportation needs:    Medical: Not on file    Non-medical: Not on file  Tobacco Use  . Smoking status: Former Smoker    Packs/day: 0.50    Years: 5.00    Pack years: 2.50    Types: Cigarettes    Last attempt to quit: 03/15/2000    Years since quitting: 17.4  . Smokeless tobacco: Never Used  Substance and Sexual Activity  . Alcohol use: No  . Drug use: No    Comment: quit smoking 11 years ago  . Sexual activity: Yes    Birth control/protection: Post-menopausal  Lifestyle  . Physical activity:    Days per week: Not on file    Minutes per session: Not on file  . Stress: Not on file  Relationships  . Social connections:    Talks on phone: Not on file    Gets together: Not on file    Attends religious service: Not on file    Active member of club or organization: Not on file    Attends meetings of clubs or organizations: Not on file    Relationship status: Not on file  . Intimate partner  violence:    Fear of current or ex partner: Not on file    Emotionally abused: Not on file    Physically abused: Not on file    Forced sexual activity: Not on file  Other Topics Concern  . Not on file  Social History Narrative  . Not on file      Review of Systems  All other systems reviewed and are negative.  Objective:   Physical Exam  Cardiovascular: Normal rate, regular rhythm and normal heart sounds.  Pulmonary/Chest: Effort normal and breath sounds normal.  Abdominal: Bowel sounds are normal. She exhibits no distension and no mass. There is no tenderness. There is no guarding.  Vitals reviewed.         Assessment & Plan:  Dysuria - Plan: Urinalysis, Routine w reflex microscopic, Urine Culture  I believe the patient has adequately treated her infection using the doxycycline.  However given the fact her symptoms persisted up until yesterday having taken the medication for 7 days, will obtain a urine culture.  If symptoms return, she can use Cipro 500 mg p.o. twice daily for 5 days.  If urine culture is clear, no further treatment is necessary.  She will not take antibiotics unless symptoms return.

## 2017-08-28 LAB — URINE CULTURE
MICRO NUMBER:: 90716118
Result:: NO GROWTH
SPECIMEN QUALITY: ADEQUATE

## 2017-10-17 ENCOUNTER — Other Ambulatory Visit: Payer: Self-pay | Admitting: Family Medicine

## 2017-11-18 ENCOUNTER — Other Ambulatory Visit: Payer: Self-pay | Admitting: Oncology

## 2017-11-18 DIAGNOSIS — Z853 Personal history of malignant neoplasm of breast: Secondary | ICD-10-CM

## 2017-12-02 ENCOUNTER — Other Ambulatory Visit: Payer: Self-pay

## 2017-12-02 ENCOUNTER — Ambulatory Visit (INDEPENDENT_AMBULATORY_CARE_PROVIDER_SITE_OTHER): Payer: BLUE CROSS/BLUE SHIELD | Admitting: Family Medicine

## 2017-12-02 ENCOUNTER — Ambulatory Visit
Admission: RE | Admit: 2017-12-02 | Discharge: 2017-12-02 | Disposition: A | Discharge status: Home / Self Care | Payer: BLUE CROSS/BLUE SHIELD | Source: Ambulatory Visit | Attending: Family Medicine | Admitting: Family Medicine

## 2017-12-02 ENCOUNTER — Encounter: Payer: Self-pay | Admitting: Family Medicine

## 2017-12-02 VITALS — BP 130/68 | HR 62 | Temp 98.4°F | Resp 14 | Ht 64.5 in | Wt 229.0 lb

## 2017-12-02 DIAGNOSIS — R21 Rash and other nonspecific skin eruption: Secondary | ICD-10-CM

## 2017-12-02 DIAGNOSIS — M7732 Calcaneal spur, left foot: Secondary | ICD-10-CM | POA: Diagnosis not present

## 2017-12-02 DIAGNOSIS — M25572 Pain in left ankle and joints of left foot: Secondary | ICD-10-CM

## 2017-12-02 DIAGNOSIS — M2550 Pain in unspecified joint: Secondary | ICD-10-CM | POA: Diagnosis not present

## 2017-12-02 MED ORDER — MELOXICAM 7.5 MG PO TABS: 7.5000 mg | ORAL_TABLET | Freq: Every day | ORAL | 0 refills | Status: DC

## 2017-12-02 MED ORDER — TRIAMCINOLONE ACETONIDE 0.1 % EX OINT: 1.0000 "application " | TOPICAL_OINTMENT | Freq: Two times a day (BID) | CUTANEOUS | 0 refills | Status: DC

## 2017-12-02 MED ORDER — PREDNISONE 20 MG PO TABS: ORAL_TABLET | ORAL | 0 refills | Status: DC

## 2017-12-02 NOTE — Patient Instructions (Signed)
Get an ankle brace - compression or an ankle ASO - can go to [facility] or call to see if they have - you can also get over the counter or on [place].    Continue to rest, ice, elevate.  I would wait for other joints, and get to ortho, because they can do xrays in office    Ankle Pain Many things can cause ankle pain, including an injury to the area and overuse of the ankle.The ankle joint holds your body weight and allows you to move around. Ankle pain can occur on either side or the back of one ankle or both ankles. Ankle pain may be sharp and burning or dull and aching. There may be tenderness, stiffness, redness, or warmth around the ankle. Follow these instructions at home: Activity  Rest your ankle as told by your health care provider. Avoid any activities that cause ankle pain.  Do exercises as told by your health care provider.  Ask your health care provider if you can drive. Using a brace, a bandage, or crutches  If you were given a brace: ? Wear it as told by your health care provider. ? Remove it when you take a bath or a shower. ? Try not to move your ankle very much, but wiggle your toes from time to time. This helps to prevent swelling.  If you were given an elastic bandage: ? Remove it when you take a bath or a shower. ? Try not to move your ankle very much, but wiggle your toes from time to time. This helps to prevent swelling. ? Adjust the bandage to make it more comfortable if it feels too tight. ? Loosen the bandage if you have numbness or tingling in your foot or if your foot turns cold and blue.  If you have crutches, use them as told by your health care provider. Continue to use them until you can walk without feeling pain in your ankle. Managing pain, stiffness, and swelling  Raise (elevate) your ankle above the level of your heart while you are sitting or lying down.  If directed, apply ice to the area: ? Put ice in a plastic bag. ? Place a towel  between your skin and the bag. ? Leave the ice on for 20 minutes, 2-3 times per day. General instructions  Keep all follow-up visits as told by your health care provider. This is important.  Record this information that may be helpful for you and your health care provider: ? How often you have ankle pain. ? Where the pain is located. ? What the pain feels like.  Take over-the-counter and prescription medicines only as told by your health care provider. Contact a health care provider if:  Your pain gets worse.  Your pain is not relieved with medicines.  You have a fever or chills.  You are having more trouble with walking.  You have new symptoms. Get help right away if:  Your foot, leg, toes, or ankle tingles or becomes numb.  Your foot, leg, toes, or ankle becomes swollen.  Your foot, leg, toes, or ankle turns pale or blue. This information is not intended to replace advice given to you by your health care provider. Make sure you discuss any questions you have with your health care provider. Document Released: 08/19/2009 Document Revised: 10/31/2015 Document Reviewed: 10/01/2014 Elsevier Interactive Patient Education  [employer].

## 2017-12-02 NOTE — Progress Notes (Signed)
Patient ID: Amanda Hoover, female    DOB: [birth year], 62 y.o.   MRN: [id number]  PCP: Susy Frizzle, MD  Chief Complaint  Patient presents with  . L Foot Pain    x1 week- has had pitting edema in B feet, but now she has swelling down from side of ankle to arch of foot  . Rash    psoriasis flare to elbows    Subjective:   Amanda Hoover is a 62 y.o. female, presents to clinic with CC of left ankle pain x 2 weeks located to medial ankle, radiating from arch of foot to just above medial ankle bone, severe pain, constant, severity variable, pain burning, stinging, feels hot and also aching, worse with activity and being up on her feet, causing her to limp, associated swelling, no aleviating factors. She reports hx of chronic left knee and hip pain for years that she has not worked up even with PCP ordered Xrays in the past.  She is wondering if its related to how she compensates for pain in her knees or hips.  Over a month ago she also traveled to [place] and while there she had worsening dependent edema in both legs that was pitting but that did resolve after returning home.   Swelling now is currently located only around the left ankle joint, without pitting, is not going into calf, swelling will improve sometimes when she elevates her leg, however in the past 2 weeks the ankle pain has been more severe, and she has had some swelling that did not resolve on days when she was off of work, not up on her feet walking all day, and elevating her legs for a day or 2.  Today the swelling is at its best it is been in about 2 weeks but is still noticeably swollen and tender to the touch.  She denies any injury, strain, bruising, redness.  Prior to her ankle pain worsening the past 2 weeks she did not do anything strenuous, any long amounts of walking.  She also has mild flare of chronic rash, would like refill of topical steroid.  Patient Active Problem List   Diagnosis Date Noted  . Chronic  ITP (idiopathic thrombocytopenia) (HCC)   . Osteopenia 10/10/2014  . BMI 39.0-39.9,adult 01/01/2014  . Elevated liver enzymes 07/26/2013  . Hot flashes due to tamoxifen 07/26/2013  . Thrombocytopenia ([facility]) 01/29/2013  . Chronic headaches   . 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 Chest pain 07/06/2010  . Palpitations 07/06/2010  . GERD 10/02/2008  . OTHER DYSPHAGIA 10/02/2008     Prior to Admission medications   Medication Sig Start Date End Date Taking? Authorizing Provider  ALPRAZolam Duanne Moron) 0.25 MG tablet TAKE 1 TABLET BY MOUTH TWICE A DAY AS NEEDED FOR ANXIETY 02/07/17  Yes Orlena Sheldon, PA-C  buPROPion (WELLBUTRIN XL) 300 MG 24 hr tablet  11/04/17  Yes [provider]  levothyroxine (SYNTHROID, LEVOTHROID) 25 MCG tablet Take 25 mcg by mouth daily before breakfast. Pt not sure of dose   Yes [provider]  pantoprazole (PROTONIX) 40 MG tablet TAKE 1 TABLET BY MOUTH  TWICE A DAY 10/17/17  Yes Susy Frizzle, MD  tamoxifen (NOLVADEX) 20 MG tablet TAKE 1 TABLET BY MOUTH  EVERY DAY 05/05/16  Yes Magrinat, Virgie Dad, MD     Allergies  Allergen Reactions  . Macadamia Nut Oil Anaphylaxis  . Other Swelling  POPCORN:  SWELLING THROAT  . Peanut-Containing Drug Products Swelling and Other (See Comments)    THROAT TIGHTNESS; ALSO WALNUTS  . Tomato Swelling    SWELLING UVULA  . Adhesive [Tape] Other (See Comments)    BLISTERS  . Flour Other (See Comments)    SNEEZING  . Penicillins Hives  . Hydrocodone Hives  . Oxycodone   . Sulfa Antibiotics Nausea Only  . Vicodin [Hydrocodone-Acetaminophen] Swelling    Pt had flushing, then itching and swelling of lips  . Eggs Or Egg-Derived Products Other (See Comments)    POSITIVE ON ALLERGY TEST, BUT EATS EGGS WITHOUT PROBLEM  . Soap Other (See Comments)    FRAGRANCES (SOAP/LOTION/PERFUME):  HEADACHE, RUNNY NOSE  . Tramadol Nausea And Vomiting     Family History  Problem  Relation Age of Onset  . Crohn's disease Mother   . Irritable bowel syndrome Mother   . Esophageal cancer Father 46  . Breast cancer Paternal Aunt        dx in her early 52s  . Diabetes Maternal Grandmother   . Breast cancer Paternal Grandmother 36  . Diabetes Paternal Grandmother   . Cancer Maternal Uncle        unknown form of cancer  . Melanoma Cousin        maternal cousin dx in her 72s  . Breast cancer Unknown   . Liver disease Unknown   . Colon cancer Neg Hx   . Rectal cancer Neg Hx   . Stomach cancer Neg Hx      Social History   Socioeconomic History  . Marital status: Married    Spouse name: Not on file  . Number of children: 4  . Years of education: Not on file  . Highest education level: Not on file  Occupational History    Employer: [employer]    Comment: Teacher, English as a foreign language for [employer]  Social Needs  . Financial resource strain: Not on file  . Food insecurity:    Worry: Not on file    Inability: Not on file  . Transportation needs:    Medical: Not on file    Non-medical: Not on file  Tobacco Use  . Smoking status: Former Smoker    Packs/day: 0.50    Years: 5.00    Pack years: 2.50    Types: Cigarettes    Last attempt to quit: 03/15/2000    Years since quitting: 17.7  . Smokeless tobacco: Never Used  Substance and Sexual Activity  . Alcohol use: No  . Drug use: No    Comment: quit smoking 11 years ago  . Sexual activity: Yes    Birth control/protection: Post-menopausal  Lifestyle  . Physical activity:    Days per week: Not on file    Minutes per session: Not on file  . Stress: Not on file  Relationships  . Social connections:    Talks on phone: Not on file    Gets together: Not on file    Attends religious service: Not on file    Active member of club or organization: Not on file    Attends meetings of clubs or organizations: Not on file    Relationship status: Not on file  . Intimate partner violence:    Fear of current or ex partner: Not  on file    Emotionally abused: Not on file    Physically abused: Not on file    Forced sexual activity: Not on file  Other Topics Concern  .  Not on file  Social History Narrative  . Not on file     Review of Systems  Constitutional: Negative.   HENT: Negative.   Eyes: Negative.   Respiratory: Negative.   Cardiovascular: Negative.   Gastrointestinal: Negative.   Endocrine: Negative.   Genitourinary: Negative.   Musculoskeletal: Negative.   Skin: Negative.   Allergic/Immunologic: Negative.   Neurological: Negative.   Hematological: Negative.   Psychiatric/Behavioral: Negative.   All other systems reviewed and are negative.      Objective:    Vitals:   12/02/17 0829  BP: 130/68  Pulse: 62  Resp: 14  Temp: 98.4 F (36.9 C)  TempSrc: Oral  SpO2: 98%  Weight: 229 lb (103.9 kg)  Height: 5' 4.5" (1.638 m)      Physical Exam  Constitutional: She appears well-developed.  HENT:  Head: Normocephalic and atraumatic.  Nose: Nose normal.  Eyes: Conjunctivae are normal. Right eye exhibits no discharge. Left eye exhibits no discharge.  Neck: No tracheal deviation present.  Cardiovascular: Normal rate and regular rhythm.  Pulmonary/Chest: Effort normal. No stridor. No respiratory distress.  Musculoskeletal: Normal range of motion.       Left ankle: She exhibits swelling. She exhibits no ecchymosis. Tenderness. Medial malleolus tenderness found. No lateral malleolus tenderness found. Achilles tendon normal.       Feet:  Area of edema and tenderness, no ecchymosis, no erythema, skin intact, no pitting edema  B/l calf circumference is equal Antalgic gait B/l LE with normal sensation to light touch, normal capillary refill, 2+ PD and PT pulses  Neurological: She is alert. She exhibits normal muscle tone. Coordination normal.  Skin: Skin is warm, dry and intact. Capillary refill takes less than 2 seconds. No rash noted. No cyanosis or erythema. Nails show no clubbing.  B/l  elbow plaques mildly erythematous  Psychiatric: She has a normal mood and affect. Her behavior is normal.  Nursing note and vitals reviewed.         Assessment & Plan:      ICD-10-CM   1. Acute left ankle pain M25.572 predniSONE (DELTASONE) 20 MG tablet    DG Ankle Complete Left   edema and tenderness, no injury, does not appear to be concerning for DVT or consistent with dependent edema, eval with xray, may be arthritis flare, tx with prednisone first and then after completed start mobic.  Obtain xray   2. Polyarthralgia M25.50 meloxicam (MOBIC) 7.5 MG tablet  3. Rash and nonspecific skin eruption R21    reported hx of psoriasis, consistent with this, topical steroid        Delsa Grana, PA-C 12/02/17 8:56 AM

## 2017-12-08 ENCOUNTER — Other Ambulatory Visit: Payer: Self-pay | Admitting: Podiatry

## 2017-12-08 ENCOUNTER — Ambulatory Visit (INDEPENDENT_AMBULATORY_CARE_PROVIDER_SITE_OTHER): Payer: BLUE CROSS/BLUE SHIELD

## 2017-12-08 ENCOUNTER — Ambulatory Visit (INDEPENDENT_AMBULATORY_CARE_PROVIDER_SITE_OTHER): Payer: BLUE CROSS/BLUE SHIELD | Admitting: Podiatry

## 2017-12-08 ENCOUNTER — Encounter: Payer: Self-pay | Admitting: Podiatry

## 2017-12-08 VITALS — BP 129/72 | HR 91 | Resp 16

## 2017-12-08 DIAGNOSIS — M25572 Pain in left ankle and joints of left foot: Secondary | ICD-10-CM

## 2017-12-08 DIAGNOSIS — M76822 Posterior tibial tendinitis, left leg: Secondary | ICD-10-CM

## 2017-12-08 MED ORDER — TRIAMCINOLONE ACETONIDE 10 MG/ML IJ SUSP
10.0000 mg | Freq: Once | INTRAMUSCULAR | Status: AC
  Administered 2017-12-08: 10 mg

## 2017-12-08 NOTE — Progress Notes (Signed)
Subjective:   Patient ID: Amanda Hoover, female   DOB: 62 y.o.   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  Patient does not smoke and likes to be active   Review of Systems  All other systems reviewed and are negative.       Objective:  Physical Exam  Constitutional: She appears well-developed and well-nourished.  Cardiovascular: Intact distal pulses.  Pulmonary/Chest: Effort normal.  Musculoskeletal: Normal range of motion.  Neurological: She is alert.  Skin: Skin is warm.  Nursing note and vitals reviewed.   Neurovascular status found to be intact muscle strength was adequate range of motion within normal limits with patient found to have inflammation and pain of the left ankle medial side with no indications of tendon dysfunction.  Patient is noted to have good digital perfusion well oriented times through and does not have a flattening of the arch and I did check muscle strength posterior tibial tendon and found to be normal     Assessment:  Probability for posterior tibial tendinitis left with slight possibility for tarsal tunnel syndrome     Plan:  H&P x-rays reviewed condition discussed and today I did a careful sheath injection of the left posterior tibial tendon after doing sterile prep of the area and applied sterile dressing afterwards.  I applied fascial brace gave instructions for supportive shoes physical therapy and reappoint in 2 weeks to reevaluate  X-ray was negative for signs of fracture and did not indicate depression of the arch on the left foot

## 2017-12-08 NOTE — Progress Notes (Signed)
   Subjective:    Patient ID: Amanda Hoover, female    DOB: 20-Aug-1955, 62 y.o.   MRN: [id number]  HPI    Review of Systems  All other systems reviewed and are negative.      Objective:   Physical Exam        Assessment & Plan:

## 2017-12-15 ENCOUNTER — Other Ambulatory Visit: Payer: Self-pay | Admitting: Oncology

## 2017-12-15 ENCOUNTER — Other Ambulatory Visit: Payer: Self-pay

## 2017-12-15 DIAGNOSIS — Z853 Personal history of malignant neoplasm of breast: Secondary | ICD-10-CM

## 2017-12-16 ENCOUNTER — Other Ambulatory Visit: Payer: Self-pay | Admitting: Adult Health

## 2017-12-16 DIAGNOSIS — Z853 Personal history of malignant neoplasm of breast: Secondary | ICD-10-CM

## 2017-12-19 ENCOUNTER — Ambulatory Visit
Admission: RE | Admit: 2017-12-19 | Discharge: 2017-12-19 | Disposition: A | Discharge status: Home / Self Care | Payer: BLUE CROSS/BLUE SHIELD | Source: Ambulatory Visit | Attending: Oncology | Admitting: Oncology

## 2017-12-19 DIAGNOSIS — R928 Other abnormal and inconclusive findings on diagnostic imaging of breast: Secondary | ICD-10-CM | POA: Diagnosis not present

## 2017-12-19 DIAGNOSIS — Z853 Personal history of malignant neoplasm of breast: Secondary | ICD-10-CM | POA: Diagnosis not present

## 2017-12-26 ENCOUNTER — Ambulatory Visit (INDEPENDENT_AMBULATORY_CARE_PROVIDER_SITE_OTHER): Payer: BLUE CROSS/BLUE SHIELD | Admitting: Podiatry

## 2017-12-26 ENCOUNTER — Encounter: Payer: Self-pay | Admitting: Podiatry

## 2017-12-26 DIAGNOSIS — M76822 Posterior tibial tendinitis, left leg: Secondary | ICD-10-CM | POA: Diagnosis not present

## 2017-12-28 NOTE — Progress Notes (Signed)
Subjective:   Patient ID: Amanda Hoover, female   DOB: 62 y.o.   MRN: [id number]   HPI Patient presents stating my ankle is feeling quite a bit better left with pain still if I do a lot of walking   ROS      Objective:  Physical Exam  Neurovascular status intact with reduced inflammation around the posterior tibial tendon left as it comes underneath the navicular bone with moderate depression of the arch noted     Assessment:  Posterior tibial tendinitis improving left present     Plan:  Advised on continued range of motion exercises ice therapy supportive shoe gear usage and patient will be seen back for [place] to recheck again in the next several weeks.  Encouraged her to test it with activity and we will see how it holds up

## 2018-01-06 ENCOUNTER — Encounter: Payer: Self-pay | Admitting: Family Medicine

## 2018-01-06 ENCOUNTER — Ambulatory Visit (INDEPENDENT_AMBULATORY_CARE_PROVIDER_SITE_OTHER): Payer: BLUE CROSS/BLUE SHIELD | Admitting: Family Medicine

## 2018-01-06 VITALS — BP 130/70 | HR 83 | Temp 98.0°F | Resp 20 | Ht 64.25 in | Wt 231.0 lb

## 2018-01-06 DIAGNOSIS — J069 Acute upper respiratory infection, unspecified: Secondary | ICD-10-CM

## 2018-01-06 MED ORDER — HYDROCODONE-HOMATROPINE 5-1.5 MG/5ML PO SYRP: 5.0000 mL | ORAL_SOLUTION | Freq: Three times a day (TID) | ORAL | 0 refills | Status: DC | PRN

## 2018-01-06 NOTE — Progress Notes (Signed)
Subjective:    Patient ID: Amanda Hoover, female    DOB: Nov 01, 1955, 62 y.o.   MRN: [id number]  HPI Patient symptoms started yesterday.  Symptoms include postnasal drip, head congestion, rhinorrhea, and severe sore throat.  She denies any fevers or chills.  She denies any sinus pain although she does have a dull headache in her frontal sinus area.  She denies any cough.  She denies any chest pain or shortness of breath.  She denies any nausea or vomiting.  There is no lymphadenopathy in the neck that is pronounced. Past Medical History:  Diagnosis Date  . Allergy   . Anxiety   . 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  . Breast cancer, right ([place])    ER 0 PR0 LN neg.  . Cancer ([place])   . Chronic headaches   . Chronic ITP (idiopathic thrombocytopenia) (HCC)    mild thrombocytopenia, normal spleen on [place] 11/18, mild cirrhosis  . Dental crowns present   . Depression    no current meds.  Marland Kitchen GERD (gastroesophageal reflux disease)   . Headache(784.0)    stress, sinus headaches  . Heart murmur   . Hiatal hernia   . Hiatal hernia   . History of colon polyps   . History of esophageal stricture    has had esophageal dilation x 1  . History of migraine   . History of radiation therapy 03/28/2012-05/17/2012   60.4 gray to right breast  . Hyperlipidemia   . Multiple environmental allergies   . Osteopenia   . Osteopenia   . Palpitations    occasional; worse with ingestion of caffeine; no cardiologist  . Personal history of radiation therapy 2013   Right Breast Cancer  . Rash 01/06/2012   right elbow  . Rhinitis, allergic    rhinitis  . Thyroid disease    Past Surgical History:  Procedure Laterality Date  . BREAST LUMPECTOMY Right 2013  . COLONOSCOPY    . COLONOSCOPY W/ POLYPECTOMY    . ESOPHAGOGASTRODUODENOSCOPY    . PARTIAL MASTECTOMY WITH NEEDLE LOCALIZATION AND AXILLARY SENTINEL LYMPH NODE BX  01/12/2012   Procedure: PARTIAL  MASTECTOMY WITH NEEDLE LOCALIZATION AND AXILLARY SENTINEL LYMPH NODE BX;  Surgeon: Adin Hector, MD;  Location: [facility];  Service: General;  Laterality: Right;  . RE-EXCISION OF BREAST CANCER,SUPERIOR MARGINS  02/15/2012   Procedure: RE-EXCISION OF BREAST CANCER,SUPERIOR MARGINS;  Surgeon: Adin Hector, MD;  Location: [facility];  Service: General;  Laterality: Right;  Re-excision of right lumpectomy, close posterior margin.   Current Outpatient Medications on File Prior to Visit  Medication Sig Dispense Refill  . ALPRAZolam (XANAX) 0.25 MG tablet TAKE 1 TABLET BY MOUTH TWICE A DAY AS NEEDED FOR ANXIETY 60 tablet 0  . buPROPion (WELLBUTRIN XL) 300 MG 24 hr tablet     . levothyroxine (SYNTHROID, LEVOTHROID) 25 MCG tablet Take 25 mcg by mouth daily before breakfast. Pt takes .05 mg    . meloxicam (MOBIC) 7.5 MG tablet Take 1 tablet (7.5 mg total) by mouth daily. 30 tablet 0  . pantoprazole (PROTONIX) 40 MG tablet TAKE 1 TABLET BY MOUTH  TWICE A DAY 180 tablet 1  . triamcinolone ointment (KENALOG) 0.1 % Apply 1 application topically 2 (two) times daily. 30 g 0  . EPINEPHrine 0.3 mg/0.3 mL IJ SOAJ injection      No current facility-administered medications on file prior to visit.    Allergies  Allergen Reactions  . Macadamia Nut Oil Anaphylaxis  . Other Swelling    POPCORN:  SWELLING THROAT  . Peanut-Containing Drug Products Swelling and Other (See Comments)    THROAT TIGHTNESS; ALSO WALNUTS  . Tomato Swelling    SWELLING UVULA  . Adhesive [Tape] Other (See Comments)    BLISTERS  . Flour Other (See Comments)    SNEEZING  . Penicillins Hives  . Hydrocodone Hives  . Oxycodone   . Sulfa Antibiotics Nausea Only  . Vicodin [Hydrocodone-Acetaminophen] Swelling    Pt had flushing, then itching and swelling of lips  . Eggs Or Egg-Derived Products Other (See Comments)    POSITIVE ON ALLERGY TEST, BUT EATS EGGS WITHOUT PROBLEM  . Soap Other (See  Comments)    FRAGRANCES (SOAP/LOTION/PERFUME):  HEADACHE, RUNNY NOSE  . Tramadol Nausea And Vomiting   Social History   Socioeconomic History  . Marital status: Married    Spouse name: Not on file  . Number of children: 4  . Years of education: Not on file  . Highest education level: Not on file  Occupational History    Employer: [employer]    Comment: Teacher, English as a foreign language for [employer]  Social Needs  . Financial resource strain: Not on file  . Food insecurity:    Worry: Not on file    Inability: Not on file  . Transportation needs:    Medical: Not on file    Non-medical: Not on file  Tobacco Use  . Smoking status: Former Smoker    Packs/day: 0.50    Years: 5.00    Pack years: 2.50    Types: Cigarettes    Last attempt to quit: 03/15/2000    Years since quitting: 17.8  . Smokeless tobacco: Never Used  Substance and Sexual Activity  . Alcohol use: No  . Drug use: No    Comment: quit smoking 11 years ago  . Sexual activity: Yes    Birth control/protection: Post-menopausal  Lifestyle  . Physical activity:    Days per week: Not on file    Minutes per session: Not on file  . Stress: Not on file  Relationships  . Social connections:    Talks on phone: Not on file    Gets together: Not on file    Attends religious service: Not on file    Active member of club or organization: Not on file    Attends meetings of clubs or organizations: Not on file    Relationship status: Not on file  . Intimate partner violence:    Fear of current or ex partner: Not on file    Emotionally abused: Not on file    Physically abused: Not on file    Forced sexual activity: Not on file  Other Topics Concern  . Not on file  Social History Narrative  . Not on file      Review of Systems  All other systems reviewed and are negative.      Objective:   Physical Exam  Constitutional: She appears well-developed and well-nourished. No distress.  HENT:  Right Ear: Tympanic membrane,  external ear and ear canal normal.  Left Ear: Tympanic membrane, external ear and ear canal normal.  Nose: Mucosal edema and rhinorrhea present. Right sinus exhibits no maxillary sinus tenderness and no frontal sinus tenderness. Left sinus exhibits no maxillary sinus tenderness and no frontal sinus tenderness.  Mouth/Throat: Oropharynx is clear and moist. No oropharyngeal exudate.  Eyes: Conjunctivae are normal.  Cardiovascular:  Normal rate, regular rhythm and normal heart sounds. Exam reveals no gallop and no friction rub.  No murmur heard. Pulmonary/Chest: Effort normal and breath sounds normal. No stridor. No respiratory distress. She has no wheezes. She has no rales.  Lymphadenopathy:    She has no cervical adenopathy.  Skin: She is not diaphoretic.  Vitals reviewed.         Assessment & Plan:  Viral upper respiratory tract infection  Symptoms are consistent with a viral upper respiratory infection.  I recommended symptomatic treatment with Tylenol and/or ibuprofen for fever and sore throat, Tylenol Cold and sinus for head congestion and rhinorrhea.  She can also use antihistamines for head congestion and rhinorrhea.  She can use Hycodan 1 teaspoon every 8 hours as needed for severe cough if it develops.  Anticipate gradual improvement by Thursday of next week.  I will likely get worse over the next 24 hours.  Patient will call me if her symptoms change or worsen.

## 2018-01-17 DIAGNOSIS — Z1329 Encounter for screening for other suspected endocrine disorder: Secondary | ICD-10-CM | POA: Diagnosis not present

## 2018-01-17 DIAGNOSIS — E039 Hypothyroidism, unspecified: Secondary | ICD-10-CM | POA: Diagnosis not present

## 2018-01-17 DIAGNOSIS — Z131 Encounter for screening for diabetes mellitus: Secondary | ICD-10-CM | POA: Diagnosis not present

## 2018-01-17 DIAGNOSIS — R32 Unspecified urinary incontinence: Secondary | ICD-10-CM | POA: Diagnosis not present

## 2018-01-17 DIAGNOSIS — Z6839 Body mass index (BMI) 39.0-39.9, adult: Secondary | ICD-10-CM | POA: Diagnosis not present

## 2018-01-17 DIAGNOSIS — Z13220 Encounter for screening for lipoid disorders: Secondary | ICD-10-CM | POA: Diagnosis not present

## 2018-01-17 DIAGNOSIS — Z130 Encounter for screening for diseases of the blood and blood-forming organs and certain disorders involving the immune mechanism: Secondary | ICD-10-CM | POA: Diagnosis not present

## 2018-01-17 DIAGNOSIS — Z0000 Encounter for general adult medical examination without abnormal findings: Secondary | ICD-10-CM | POA: Diagnosis not present

## 2018-01-17 DIAGNOSIS — Z23 Encounter for immunization: Secondary | ICD-10-CM | POA: Diagnosis not present

## 2018-01-17 DIAGNOSIS — Z01419 Encounter for gynecological examination (general) (routine) without abnormal findings: Secondary | ICD-10-CM | POA: Diagnosis not present

## 2018-01-25 ENCOUNTER — Telehealth: Payer: Self-pay | Admitting: Family Medicine

## 2018-01-25 NOTE — Telephone Encounter (Signed)
Patient has had imaging put in for her left knee, however never went to her imaging  Would like to know if the order can be put back in   [phone]

## 2018-01-26 NOTE — Telephone Encounter (Signed)
Spoke with patient and informed her that office visit will be needed. Patient verbalized understanding and scheduled and appointment.

## 2018-01-31 ENCOUNTER — Encounter: Payer: Self-pay | Admitting: Family Medicine

## 2018-01-31 ENCOUNTER — Ambulatory Visit (INDEPENDENT_AMBULATORY_CARE_PROVIDER_SITE_OTHER): Payer: BLUE CROSS/BLUE SHIELD | Admitting: Family Medicine

## 2018-01-31 VITALS — BP 120/70 | HR 88 | Temp 98.6°F | Resp 16 | Ht 64.25 in | Wt 236.0 lb

## 2018-01-31 DIAGNOSIS — M25552 Pain in left hip: Secondary | ICD-10-CM

## 2018-01-31 DIAGNOSIS — M25562 Pain in left knee: Secondary | ICD-10-CM | POA: Diagnosis not present

## 2018-01-31 NOTE — Progress Notes (Signed)
Subjective:    Patient ID: Amanda Hoover, female    DOB: 1955-06-29, 62 y.o.   MRN: [id number]  HPI  Patient presents today with gradually worsening pain in her left hip and left knee.  The pain in her left hip is located over the greater trochanter.  She is extremely tender to palpation when I press over the greater trochanter.  She has a difficult time sleeping at night.  Anything that puts pressure in that area elicits pain.  However she also reports pain over the lateral aspect of her knee.  She is tender to palpation over the lateral joint line.  On examination today, she has weak hip abductors on the left side compared to the right side.  She also has a positive Ober's test adjusting iliotibial band syndrome.  She has no laxity to varus or valgus stress over the knee.  She has a negative Apley grind.  There is no evidence of meniscal tear.  She has no tenderness to palpation over the medial joint line.  She has a negative patellar grind.  Pain is more lateral than I would expect with simple arthritis over the lateral joint line. Past Medical History:  Diagnosis Date  . Allergy   . Anxiety   . Breast cancer (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  . Breast cancer, right ([facility])    ER 0 PR0 LN neg.  . Cancer ([place])   . Chronic headaches   . Chronic ITP (idiopathic thrombocytopenia) (HCC)    mild thrombocytopenia, normal spleen on [place] 11/18, mild cirrhosis  . Dental crowns present   . Depression    no current meds.  Marland Kitchen GERD (gastroesophageal reflux disease)   . Headache(784.0)    stress, sinus headaches  . Heart murmur   . Hiatal hernia   . Hiatal hernia   . History of colon polyps   . History of esophageal stricture    has had esophageal dilation x 1  . History of migraine   . History of radiation therapy 03/28/2012-05/17/2012   60.4 gray to right breast  . Hyperlipidemia   . Multiple environmental allergies   . Osteopenia   . Osteopenia   .  Palpitations    occasional; worse with ingestion of caffeine; no cardiologist  . Personal history of radiation therapy 2013   Right Breast Cancer  . Rash 01/06/2012   right elbow  . Rhinitis, allergic    rhinitis  . Thyroid disease    Past Surgical History:  Procedure Laterality Date  . BREAST LUMPECTOMY Right 2013  . COLONOSCOPY    . COLONOSCOPY W/ POLYPECTOMY    . ESOPHAGOGASTRODUODENOSCOPY    . PARTIAL MASTECTOMY WITH NEEDLE LOCALIZATION AND AXILLARY SENTINEL LYMPH NODE BX  01/12/2012   Procedure: PARTIAL MASTECTOMY WITH NEEDLE LOCALIZATION AND AXILLARY SENTINEL LYMPH NODE BX;  Surgeon: Adin Hector, MD;  Location: [facility];  Service: General;  Laterality: Right;  . RE-EXCISION OF BREAST CANCER,SUPERIOR MARGINS  02/15/2012   Procedure: RE-EXCISION OF BREAST CANCER,SUPERIOR MARGINS;  Surgeon: Adin Hector, MD;  Location: [facility];  Service: General;  Laterality: Right;  Re-excision of right lumpectomy, close posterior margin.   Current Outpatient Medications on File Prior to Visit  Medication Sig Dispense Refill  . ALPRAZolam (XANAX) 0.25 MG tablet TAKE 1 TABLET BY MOUTH TWICE A DAY AS NEEDED FOR ANXIETY 60 tablet 0  . buPROPion (WELLBUTRIN XL) 300 MG 24 hr tablet     .  EPINEPHrine 0.3 mg/0.3 mL IJ SOAJ injection     . levothyroxine (SYNTHROID, LEVOTHROID) 25 MCG tablet Take 25 mcg by mouth every other day. Pt takes .05 mg    . levothyroxine (SYNTHROID, LEVOTHROID) 50 MCG tablet Take 50 mcg by mouth every other day.     . pantoprazole (PROTONIX) 40 MG tablet TAKE 1 TABLET BY MOUTH  TWICE A DAY 180 tablet 1   No current facility-administered medications on file prior to visit.    Allergies  Allergen Reactions  . Macadamia Nut Oil Anaphylaxis  . Other Swelling    POPCORN:  SWELLING THROAT  . Peanut-Containing Drug Products Swelling and Other (See Comments)    THROAT TIGHTNESS; ALSO WALNUTS  . Tomato Swelling    SWELLING UVULA  .  Adhesive [Tape] Other (See Comments)    BLISTERS  . Flour Other (See Comments)    SNEEZING  . Penicillins Hives  . Hydrocodone Hives  . Oxycodone   . Sulfa Antibiotics Nausea Only  . Vicodin [Hydrocodone-Acetaminophen] Swelling    Pt had flushing, then itching and swelling of lips  . Eggs Or Egg-Derived Products Other (See Comments)    POSITIVE ON ALLERGY TEST, BUT EATS EGGS WITHOUT PROBLEM  . Soap Other (See Comments)    FRAGRANCES (SOAP/LOTION/PERFUME):  HEADACHE, RUNNY NOSE  . Tramadol Nausea And Vomiting   Social History   Socioeconomic History  . Marital status: Married    Spouse name: Not on file  . Number of children: 4  . Years of education: Not on file  . Highest education level: Not on file  Occupational History    Employer: [employer]    Comment: Teacher, English as a foreign language for [employer]  Social Needs  . Financial resource strain: Not on file  . Food insecurity:    Worry: Not on file    Inability: Not on file  . Transportation needs:    Medical: Not on file    Non-medical: Not on file  Tobacco Use  . Smoking status: Former Smoker    Packs/day: 0.50    Years: 5.00    Pack years: 2.50    Types: Cigarettes    Last attempt to quit: 03/15/2000    Years since quitting: 17.8  . Smokeless tobacco: Never Used  Substance and Sexual Activity  . Alcohol use: No  . Drug use: No    Comment: quit smoking 11 years ago  . Sexual activity: Yes    Birth control/protection: Post-menopausal  Lifestyle  . Physical activity:    Days per week: Not on file    Minutes per session: Not on file  . Stress: Not on file  Relationships  . Social connections:    Talks on phone: Not on file    Gets together: Not on file    Attends religious service: Not on file    Active member of club or organization: Not on file    Attends meetings of clubs or organizations: Not on file    Relationship status: Not on file  . Intimate partner violence:    Fear of current or ex partner: Not on file     Emotionally abused: Not on file    Physically abused: Not on file    Forced sexual activity: Not on file  Other Topics Concern  . Not on file  Social History Narrative  . Not on file     Review of Systems  All other systems reviewed and are negative.      Objective:  Physical Exam  Cardiovascular: Normal rate and regular rhythm.  Pulmonary/Chest: Effort normal and breath sounds normal.  Musculoskeletal:       Left hip: She exhibits tenderness and bony tenderness. She exhibits normal range of motion, normal strength, no swelling and no crepitus.       Left knee: She exhibits normal range of motion, no swelling, no effusion, no erythema, no LCL laxity, normal patellar mobility, no bony tenderness, normal meniscus and no MCL laxity. Tenderness found. Lateral joint line tenderness noted. No medial joint line, no MCL, no LCL and no patellar tendon tenderness noted.       Legs: Vitals reviewed.         Assessment & Plan:  Acute pain of left knee - Plan: DG Knee Complete 4 Views Left  Left hip pain - Plan: DG Knee Complete 4 Views Left, DG HIP UNILAT WITH PELVIS 1V LEFT  I suspect that the patient has greater trochanteric bursitis coupled with iliotibial band syndrome causing her lateral knee pain.  I recommended an x-ray of the left hip as well as the left knee to evaluate further.  If the x-ray showed no significant arthritis, I would recommend a referral to physical therapy for iliotibial band syndrome as well as lateral knee pain and refer the patient to orthopedics for an injection for greater trochanteric bursitis.  I would also put the patient on meloxicam to calm inflammation while awaiting the referrals.  Await the results of the x-ray first

## 2018-02-01 ENCOUNTER — Ambulatory Visit
Admission: RE | Admit: 2018-02-01 | Discharge: 2018-02-01 | Disposition: A | Discharge status: Home / Self Care | Payer: BLUE CROSS/BLUE SHIELD | Source: Ambulatory Visit | Attending: Family Medicine | Admitting: Family Medicine

## 2018-02-01 DIAGNOSIS — M25562 Pain in left knee: Secondary | ICD-10-CM

## 2018-02-01 DIAGNOSIS — M1712 Unilateral primary osteoarthritis, left knee: Secondary | ICD-10-CM | POA: Diagnosis not present

## 2018-02-01 DIAGNOSIS — M25552 Pain in left hip: Secondary | ICD-10-CM

## 2018-02-02 ENCOUNTER — Other Ambulatory Visit: Payer: Self-pay | Admitting: Obstetrics

## 2018-02-02 DIAGNOSIS — M8580 Other specified disorders of bone density and structure, unspecified site: Secondary | ICD-10-CM

## 2018-02-03 ENCOUNTER — Other Ambulatory Visit: Payer: Self-pay | Admitting: Family Medicine

## 2018-02-03 DIAGNOSIS — M7062 Trochanteric bursitis, left hip: Secondary | ICD-10-CM

## 2018-02-03 DIAGNOSIS — M7632 Iliotibial band syndrome, left leg: Secondary | ICD-10-CM

## 2018-02-06 ENCOUNTER — Ambulatory Visit
Admission: RE | Admit: 2018-02-06 | Discharge: 2018-02-06 | Disposition: A | Discharge status: Home / Self Care | Payer: BLUE CROSS/BLUE SHIELD | Source: Ambulatory Visit | Attending: Obstetrics | Admitting: Obstetrics

## 2018-02-06 DIAGNOSIS — M810 Age-related osteoporosis without current pathological fracture: Secondary | ICD-10-CM | POA: Diagnosis not present

## 2018-02-06 DIAGNOSIS — M85832 Other specified disorders of bone density and structure, left forearm: Secondary | ICD-10-CM | POA: Diagnosis not present

## 2018-02-06 DIAGNOSIS — Z780 Asymptomatic menopausal state: Secondary | ICD-10-CM | POA: Diagnosis not present

## 2018-02-06 DIAGNOSIS — M8580 Other specified disorders of bone density and structure, unspecified site: Secondary | ICD-10-CM

## 2018-02-06 LAB — HM DEXA SCAN

## 2018-02-07 ENCOUNTER — Encounter (INDEPENDENT_AMBULATORY_CARE_PROVIDER_SITE_OTHER): Payer: Self-pay | Admitting: Orthopaedic Surgery

## 2018-02-07 ENCOUNTER — Ambulatory Visit (INDEPENDENT_AMBULATORY_CARE_PROVIDER_SITE_OTHER): Payer: BLUE CROSS/BLUE SHIELD | Admitting: Orthopaedic Surgery

## 2018-02-07 DIAGNOSIS — M7062 Trochanteric bursitis, left hip: Secondary | ICD-10-CM | POA: Diagnosis not present

## 2018-02-07 DIAGNOSIS — Z6841 Body Mass Index (BMI) 40.0 and over, adult: Secondary | ICD-10-CM

## 2018-02-07 MED ORDER — BUPIVACAINE HCL 0.5 % IJ SOLN
3.0000 mL | INTRAMUSCULAR | Status: AC | PRN
  Administered 2018-02-07: 3 mL via INTRA_ARTICULAR

## 2018-02-07 MED ORDER — LIDOCAINE HCL 1 % IJ SOLN
3.0000 mL | INTRAMUSCULAR | Status: AC | PRN
  Administered 2018-02-07: 3 mL

## 2018-02-07 MED ORDER — METHYLPREDNISOLONE ACETATE 40 MG/ML IJ SUSP
40.0000 mg | INTRAMUSCULAR | Status: AC | PRN
  Administered 2018-02-07: 40 mg via INTRA_ARTICULAR

## 2018-02-07 NOTE — Progress Notes (Addendum)
Office Visit Note   Patient: Amanda Hoover           Date of Birth: 1955/11/23           MRN: [id number] Visit Date: 02/07/2018              Requested by: Susy Frizzle, MD [address] [place], [place] [address] PCP: Susy Frizzle, MD   Assessment & Plan: Visit Diagnoses:  1. Trochanteric bursitis, left hip   2. Body mass index 40.0-44.9, adult ([facility])   3. Morbid obesity ([place])     Plan: Impression is left hip trochanteric bursitis and IT band syndrome.  We discussed the importance of weight loss for her condition.  Also recommend physical therapy for IT band stretches.  We did perform a cortisone injection today into the trochanteric bursa.  Questions encouraged and answered.  Follow-up as needed. The patient meets the AMA guidelines for Morbid (severe) obesity with a BMI > 40.0 and I have recommended weight loss.  Follow-Up Instructions: Return if symptoms worsen or fail to improve.   Orders:  No orders of the defined types were placed in this encounter.  No orders of the defined types were placed in this encounter.     Procedures: Large Joint Inj: L greater trochanter on 02/07/2018 5:27 PM Indications: pain Details: 22 G needle Medications: 3 mL lidocaine 1 %; 3 mL bupivacaine 0.5 %; 40 mg methylPREDNISolone acetate 40 MG/ML Outcome: tolerated well, no immediate complications Patient was prepped and draped in the usual sterile fashion.       Clinical Data: No additional findings.   Subjective: Chief Complaint  Patient presents with  . Left Knee - Pain  . Left Hip - Pain    Amanda Hoover is a 62 year old female comes in with left hip pain mainly that will radiate down the lateral aspect of the knee.  The pain is 3 out of 10.  She works at [employer] and is on her feet all day.  She denies any groin pain.  She denies any injuries.  Denies any back pain or radicular symptoms.   Review of Systems  Constitutional: Negative.   HENT: Negative.   Eyes:  Negative.   Respiratory: Negative.   Cardiovascular: Negative.   Endocrine: Negative.   Musculoskeletal: Negative.   Neurological: Negative.   Hematological: Negative.   Psychiatric/Behavioral: Negative.   All other systems reviewed and are negative.    Objective: Vital Signs: There were no vitals taken for this visit.  Physical Exam  Constitutional: She is oriented to person, place, and time. She appears well-developed and well-nourished.  HENT:  Head: Normocephalic and atraumatic.  Eyes: EOM are normal.  Neck: Neck supple.  Pulmonary/Chest: Effort normal.  Abdominal: Soft.  Neurological: She is alert and oriented to person, place, and time.  Skin: Skin is warm. Capillary refill takes less than 2 seconds.  Psychiatric: She has a normal mood and affect. Her behavior is normal. Judgment and thought content normal.  Nursing note and vitals reviewed.   Ortho Exam Left hip exam shows painless range of motion and rotation.  She is very tender over the trochanteric bursa.  Positive Ober sign.  Negative straight leg and negative Stinchfield.  Negative Faber. Specialty Comments:  No specialty comments available.  Imaging: No results found.   PMFS History: Patient Active Problem List   Diagnosis Date Noted  . Chronic ITP (idiopathic thrombocytopenia) (HCC)   . Osteopenia 10/10/2014  . BMI  39.0-39.9,adult 01/01/2014  . Elevated liver enzymes 07/26/2013  . Hot flashes due to tamoxifen 07/26/2013  . Thrombocytopenia ([facility]) 01/29/2013  . Chronic headaches   . 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 Chest pain 07/06/2010  . Palpitations 07/06/2010  . GERD 10/02/2008  . OTHER DYSPHAGIA 10/02/2008   Past Medical History:  Diagnosis Date  . Allergy   . Anxiety   . 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  . Breast cancer, right ([facility])    ER 0 PR0 LN neg.  . Cancer ([facility])   . Chronic headaches    . Chronic ITP (idiopathic thrombocytopenia) (HCC)    mild thrombocytopenia, normal spleen on [place] 11/18, mild cirrhosis  . Dental crowns present   . Depression    no current meds.  Marland Kitchen GERD (gastroesophageal reflux disease)   . Headache(784.0)    stress, sinus headaches  . Heart murmur   . Hiatal hernia   . Hiatal hernia   . History of colon polyps   . History of esophageal stricture    has had esophageal dilation x 1  . History of migraine   . History of radiation therapy 03/28/2012-05/17/2012   60.4 gray to right breast  . Hyperlipidemia   . Multiple environmental allergies   . Osteopenia   . Osteopenia   . Palpitations    occasional; worse with ingestion of caffeine; no cardiologist  . Personal history of radiation therapy 2013   Right Breast Cancer  . Rash 01/06/2012   right elbow  . Rhinitis, allergic    rhinitis  . Thyroid disease     Family History  Problem Relation Age of Onset  . Crohn's disease Mother   . Irritable bowel syndrome Mother   . Esophageal cancer Father 61  . Breast cancer Paternal Aunt        dx in her early 60s  . Diabetes Maternal Grandmother   . Breast cancer Paternal Grandmother 55  . Diabetes Paternal Grandmother   . Cancer Maternal Uncle        unknown form of cancer  . Melanoma Cousin        maternal cousin dx in her 59s  . Breast cancer Unknown   . Liver disease Unknown   . Colon cancer Neg Hx   . Rectal cancer Neg Hx   . Stomach cancer Neg Hx     Past Surgical History:  Procedure Laterality Date  . BREAST LUMPECTOMY Right 2013  . COLONOSCOPY    . COLONOSCOPY W/ POLYPECTOMY    . ESOPHAGOGASTRODUODENOSCOPY    . PARTIAL MASTECTOMY WITH NEEDLE LOCALIZATION AND AXILLARY SENTINEL LYMPH NODE BX  01/12/2012   Procedure: PARTIAL MASTECTOMY WITH NEEDLE LOCALIZATION AND AXILLARY SENTINEL LYMPH NODE BX;  Surgeon: Adin Hector, MD;  Location: [facility];  Service: General;  Laterality: Right;  . RE-EXCISION OF BREAST  CANCER,SUPERIOR MARGINS  02/15/2012   Procedure: RE-EXCISION OF BREAST CANCER,SUPERIOR MARGINS;  Surgeon: Adin Hector, MD;  Location: [facility];  Service: General;  Laterality: Right;  Re-excision of right lumpectomy, close posterior margin.   Social History   Occupational History    Employer: [employer]    Comment: Teacher, English as a foreign language for [employer]  Tobacco Use  . Smoking status: Former Smoker    Packs/day: 0.50    Years: 5.00    Pack years: 2.50    Types: Cigarettes    Last attempt to  quit: 03/15/2000    Years since quitting: 17.9  . Smokeless tobacco: Never Used  Substance and Sexual Activity  . Alcohol use: No  . Drug use: No    Comment: quit smoking 11 years ago  . Sexual activity: Yes    Birth control/protection: Post-menopausal

## 2018-02-21 ENCOUNTER — Encounter: Payer: Self-pay | Admitting: Physical Therapy

## 2018-02-21 ENCOUNTER — Other Ambulatory Visit: Payer: Self-pay

## 2018-02-21 ENCOUNTER — Ambulatory Visit: Payer: BLUE CROSS/BLUE SHIELD | Attending: Family Medicine | Admitting: Physical Therapy

## 2018-02-21 DIAGNOSIS — M79652 Pain in left thigh: Secondary | ICD-10-CM | POA: Diagnosis not present

## 2018-02-21 DIAGNOSIS — R262 Difficulty in walking, not elsewhere classified: Secondary | ICD-10-CM | POA: Diagnosis not present

## 2018-02-21 DIAGNOSIS — M6281 Muscle weakness (generalized): Secondary | ICD-10-CM | POA: Diagnosis not present

## 2018-02-21 DIAGNOSIS — M25552 Pain in left hip: Secondary | ICD-10-CM | POA: Diagnosis not present

## 2018-02-21 NOTE — Therapy (Signed)
[place], [place], [address] Phone: [phone]   Fax:  [phone]  Physical Therapy Evaluation  Patient Details  Name: Amanda Hoover MRN: [id number] Date of Birth: 1955-03-31 Referring Provider (PT): Jenna Luo, MD   Encounter Date: 02/21/2018  PT End of Session - 02/21/18 2111    Visit Number  1    Number of Visits  8    Date for PT Re-Evaluation  03/21/18    Authorization Type  BCBS-60 visit limit    PT Start Time  1655    PT Stop Time  1755    PT Time Calculation (min)  60 min    Activity Tolerance  Patient tolerated treatment well    Behavior During Therapy  [facility] for tasks assessed/performed       Past Medical History:  Diagnosis Date  . Allergy   . Anxiety   . 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  . Breast cancer, right ([place])    ER 0 PR0 LN neg.  . Cancer ([place])   . Chronic headaches   . Chronic ITP (idiopathic thrombocytopenia) (HCC)    mild thrombocytopenia, normal spleen on [place] 11/18, mild cirrhosis  . Dental crowns present   . Depression    no current meds.  Marland Kitchen GERD (gastroesophageal reflux disease)   . Headache(784.0)    stress, sinus headaches  . Heart murmur   . Hiatal hernia   . Hiatal hernia   . History of colon polyps   . History of esophageal stricture    has had esophageal dilation x 1  . History of migraine   . History of radiation therapy 03/28/2012-05/17/2012   60.4 gray to right breast  . Hyperlipidemia   . Multiple environmental allergies   . Osteopenia   . Osteopenia   . Palpitations    occasional; worse with ingestion of caffeine; no cardiologist  . Personal history of radiation therapy 2013   Right Breast Cancer  . Rash 01/06/2012   right elbow  . Rhinitis, allergic    rhinitis  . Thyroid disease     Past Surgical History:  Procedure Laterality Date  . BREAST LUMPECTOMY Right 2013  . COLONOSCOPY     . COLONOSCOPY W/ POLYPECTOMY    . ESOPHAGOGASTRODUODENOSCOPY    . PARTIAL MASTECTOMY WITH NEEDLE LOCALIZATION AND AXILLARY SENTINEL LYMPH NODE BX  01/12/2012   Procedure: PARTIAL MASTECTOMY WITH NEEDLE LOCALIZATION AND AXILLARY SENTINEL LYMPH NODE BX;  Surgeon: Adin Hector, MD;  Location: [place];  Service: General;  Laterality: Right;  . RE-EXCISION OF BREAST CANCER,SUPERIOR MARGINS  02/15/2012   Procedure: RE-EXCISION OF BREAST CANCER,SUPERIOR MARGINS;  Surgeon: Adin Hector, MD;  Location: [place];  Service: General;  Laterality: Right;  Re-excision of right lumpectomy, close posterior margin.    There were no vitals filed for this visit.   Subjective Assessment - 02/21/18 1659    Subjective  Pt. reports 7 month history of left lateral hip and thigh pain. Insidious onset/no mechanism of injury noted-pt. reports began to have initial difficulty sleeping on left side due to hip pain. Pt. subsequently diagosed with left IT band syndrome/trochanteric bursitis. She had injection 02/07/18 which she reports helped with pain. 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    Pertinent History  7 month history left lateral hip and thigh pain,  pt. also with foot/ankle pain issues    How long can you sit comfortably?  varies depending if sore after standing but generally no limitations    How long can you stand comfortably?  1 hour    How long can you walk comfortably?  varies-depends on the floor    Diagnostic tests  None    Patient Stated Goals  "To be out of pain to walk and do things."    Currently in Pain?  No/denies         [facility] PT Assessment - 02/21/18 0001      Assessment   Medical Diagnosis  Left IT band syndrome/Trochanteric bursitis    Referring Provider (PT)  Jenna Luo, MD    Onset Date/Surgical Date  07/22/17   estimated per pt. report of  7 month history of symptoms   Hand Dominance  Right      Precautions   Precautions  None      Restrictions   Weight Bearing Restrictions  No      Balance Screen   Has the patient fallen in the past 6 months  No      [place] residence    Living Arrangements  Spouse/significant other    Type of [place] Access  --   4 steps to enter     Prior Function   Level of Independence  Independent with basic ADLs      Cognition   Overall Cognitive Status  Within Functional Limits for tasks assessed      Observation/Other Assessments   Focus on Therapeutic Outcomes (FOTO)   58% Limited      ROM / Strength   AROM / PROM / Strength  AROM;Strength      AROM   Overall AROM Comments  Bilat. hip AROM grossly [facility]      Strength   Strength Assessment Site  Hip;Knee    Right/Left Hip  Right;Left    Right Hip Flexion  4+/5    Right Hip External Rotation   5/5    Right Hip Internal Rotation  5/5    Left Hip Flexion  4/5    Left Hip Extension  4/5    Left Hip External Rotation  4/5    Left Hip Internal Rotation  4/5    Left Hip ABduction  4/5    Right/Left Knee  Right;Left    Right Knee Flexion  5/5    Right Knee Extension  5/5    Left Knee Flexion  5/5    Left Knee Extension  5/5      Flexibility   Soft Tissue Assessment /Muscle Length  --   Tight left IT band, lateral hip, hamstring     Palpation   Palpation comment  TTP left greater trochanter, left gluteus medius, TFL, proximal IT band      Ambulation/Gait   Gait Comments  Mild antalgia but no signiificant Trendelenburg                Objective measurements completed on examination: See above findings.      [place] Adult PT Treatment/Exercise - 02/21/18 0001      Exercises   Exercises  Knee/Hip      Knee/Hip Exercises: Stretches   ITB Stretch  --   supine stretch-instructed HEP knee flexed variation     Knee/Hip Exercises: Standing   Hip Abduction   AROM;Stengthening;Left;1 set;10 reps  Other Standing Knee Exercises  standing left IT band/TFL stretch at walll-instruction and practice HEP      Knee/Hip Exercises: Supine   Other Supine Knee/Hip Exercises  clamshell green Theraband x 10       Trigger Point Dry Needling - 02/21/18 2109    Consent Given?  Yes    Education Handout Provided  Yes    Muscles Treated Lower Body  Gluteus minimus;Tensor fascia lata   incl. gluteus medius          PT Education - 02/21/18 2110    Education Details  HEP, POC, etiology current complaints, dry needling    Person(s) Educated  Patient    Methods  Explanation;Verbal cues;Handout    Comprehension  Verbal cues required;Returned demonstration;Verbalized understanding          PT Long Term Goals - 02/21/18 2117      PT LONG TERM GOAL #1   Title  Independent with HEP    Baseline  No HEP    Time  4    Period  Weeks    Status  New    Target Date  03/21/18      PT LONG TERM GOAL #2   Title  Increase left hip abduction strength 1/2 MMT grade to improve pelvic stability to decrease pain with ambulation    Baseline  4/5    Time  4    Period  Weeks    Status  New    Target Date  03/21/18      PT LONG TERM GOAL #3   Title  Improve FOTO to 47% or less impairment    Baseline  58% limited    Time  4    Period  Weeks    Status  New    Target Date  03/21/18      PT LONG TERM GOAL #4   Title  Tolerate standing at work for at least 1 hour with left hip pain symptoms decreased 50% or greater from current status    Time  4    Period  Weeks    Status  New    Target Date  03/21/18             Plan - 02/21/18 2111    Clinical Impression Statement  Pt. presents with left lateral hip and thigh pain wih exam findings consistent with trochanteric bursitis, associated muscular pain/IT band syndrome with hip weakness, muscle tightness and contributing factors from gait mechanics. Pt. would benefit from PT to help relieve pain and address  current associated functional limitations for mobility.    History and Personal Factors relevant to plan of care:  symptom duration, work duties with prolonged standing required    Clinical Presentation  Stable    Clinical Decision Making  Low    PT Frequency  2x / week    PT Duration  4 weeks    PT Treatment/Interventions  Cryotherapy;Electrical Stimulation;Ultrasound;Moist Heat;Iontophoresis 4mg /ml Dexamethasone;Functional mobility training;Therapeutic exercise;Therapeutic activities;Neuromuscular re-education;Patient/family education;Manual techniques;Dry needling;ADLs/Self Care Home Management;Taping    PT Next Visit Plan  Review HEP as needed, IT band/lateral hip stretches, hip strengthening, STM/foam roll lateral hip and IT band, further dry needling prn, if needed [place] or ionto    PT Home Exercise Plan  stretches-standing IT band/TFL stretch vs. supine stretch (instructed variation knee flexed), hip abd SLR in standing, clamshell       Patient will benefit from skilled therapeutic intervention in order to improve the following deficits and impairments:  Abnormal gait, Impaired flexibility, Obesity, Difficulty walking, Decreased activity tolerance, Decreased endurance, Decreased strength, Pain  Visit Diagnosis: Pain in left hip  Pain in left thigh  Muscle weakness (generalized)  Difficulty in walking, not elsewhere classified     Problem List Patient Active Problem List   Diagnosis Date Noted  . Chronic ITP (idiopathic thrombocytopenia) (HCC)   . Osteopenia 10/10/2014  . BMI 39.0-39.9,adult 01/01/2014  . Elevated liver enzymes 07/26/2013  . Hot flashes due to tamoxifen 07/26/2013  . Thrombocytopenia ([place]) 01/29/2013  . Chronic headaches   . 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 Chest pain 07/06/2010  . Palpitations 07/06/2010  . GERD 10/02/2008  . OTHER DYSPHAGIA 10/02/2008    Beaulah Dinning, PT, DPT 02/21/18 9:22  PM  [facility] [address] [place], [place], [address] Phone: [phone]   Fax:  [phone]  Name: Amanda Hoover MRN: [id number] Date of Birth: 17-Jun-1955

## 2018-02-21 NOTE — Patient Instructions (Signed)

## 2018-02-23 DIAGNOSIS — E039 Hypothyroidism, unspecified: Secondary | ICD-10-CM | POA: Diagnosis not present

## 2018-02-23 DIAGNOSIS — Z713 Dietary counseling and surveillance: Secondary | ICD-10-CM | POA: Diagnosis not present

## 2018-02-23 DIAGNOSIS — Z6839 Body mass index (BMI) 39.0-39.9, adult: Secondary | ICD-10-CM | POA: Diagnosis not present

## 2018-02-23 DIAGNOSIS — Z1321 Encounter for screening for nutritional disorder: Secondary | ICD-10-CM | POA: Diagnosis not present

## 2018-03-02 ENCOUNTER — Encounter: Payer: Self-pay | Admitting: Family Medicine

## 2018-03-02 ENCOUNTER — Ambulatory Visit (INDEPENDENT_AMBULATORY_CARE_PROVIDER_SITE_OTHER): Payer: BLUE CROSS/BLUE SHIELD | Admitting: Family Medicine

## 2018-03-02 VITALS — BP 128/72 | HR 88 | Temp 98.2°F | Resp 16 | Ht 64.25 in | Wt 232.0 lb

## 2018-03-02 DIAGNOSIS — E038 Other specified hypothyroidism: Secondary | ICD-10-CM

## 2018-03-02 DIAGNOSIS — E039 Hypothyroidism, unspecified: Secondary | ICD-10-CM

## 2018-03-02 DIAGNOSIS — M818 Other osteoporosis without current pathological fracture: Secondary | ICD-10-CM

## 2018-03-02 MED ORDER — CYCLOBENZAPRINE HCL 10 MG PO TABS: 10.0000 mg | ORAL_TABLET | Freq: Three times a day (TID) | ORAL | 0 refills | Status: DC | PRN

## 2018-03-02 NOTE — Progress Notes (Signed)
Subjective:    Patient ID: Amanda Hoover, female    DOB: December 23, 1955, 62 y.o.   MRN: [id number]  HPI  Patient is here today to discuss findings at her recent physical.  She went to see her GYN and a bone density was performed.  The T score in her left hip deteriorated from -2.4 to -3.1.  Her gynecologist recommended calcium 1 1200 mg a day, vitamin D 50,000 units weekly given her low vitamin D level, and Fosamax.  Patient is concerned due to the risk of osteonecrosis of the jaw.  She is also on levothyroxine under the care of her gynecologist for what I assume is subclinical hypothyroidism.  However recently after decreasing her levothyroxine, her gynecologist recommended that she stop it altogether based on the lab results.  I do not have the lab results to review in front of me however I suspect that her TSH was low.  Patient is confused and wonders why she can just stop the medication. Past Medical History:  Diagnosis Date  . Allergy   . Anxiety   . 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  . Breast cancer, right ([place])    ER 0 PR0 LN neg.  . Cancer ([place])   . Chronic headaches   . Chronic ITP (idiopathic thrombocytopenia) (HCC)    mild thrombocytopenia, normal spleen on [place] 11/18, mild cirrhosis  . Dental crowns present   . Depression    no current meds.  Marland Kitchen GERD (gastroesophageal reflux disease)   . Headache(784.0)    stress, sinus headaches  . Heart murmur   . Hiatal hernia   . Hiatal hernia   . History of colon polyps   . History of esophageal stricture    has had esophageal dilation x 1  . History of migraine   . History of radiation therapy 03/28/2012-05/17/2012   60.4 gray to right breast  . Hyperlipidemia   . Multiple environmental allergies   . Osteopenia   . Osteopenia   . Palpitations    occasional; worse with ingestion of caffeine; no cardiologist  . Personal history of radiation therapy 2013   Right Breast Cancer  . Rash  01/06/2012   right elbow  . Rhinitis, allergic    rhinitis  . Thyroid disease    Past Surgical History:  Procedure Laterality Date  . BREAST LUMPECTOMY Right 2013  . COLONOSCOPY    . COLONOSCOPY W/ POLYPECTOMY    . ESOPHAGOGASTRODUODENOSCOPY    . PARTIAL MASTECTOMY WITH NEEDLE LOCALIZATION AND AXILLARY SENTINEL LYMPH NODE BX  01/12/2012   Procedure: PARTIAL MASTECTOMY WITH NEEDLE LOCALIZATION AND AXILLARY SENTINEL LYMPH NODE BX;  Surgeon: Adin Hector, MD;  Location: [facility];  Service: General;  Laterality: Right;  . RE-EXCISION OF BREAST CANCER,SUPERIOR MARGINS  02/15/2012   Procedure: RE-EXCISION OF BREAST CANCER,SUPERIOR MARGINS;  Surgeon: Adin Hector, MD;  Location: [facility] [place];  Service: General;  Laterality: Right;  Re-excision of right lumpectomy, close posterior margin.   Current Outpatient Medications on File Prior to Visit  Medication Sig Dispense Refill  . ALPRAZolam (XANAX) 0.25 MG tablet TAKE 1 TABLET BY MOUTH TWICE A DAY AS NEEDED FOR ANXIETY 60 tablet 0  . buPROPion (WELLBUTRIN XL) 300 MG 24 hr tablet     . EPINEPHrine 0.3 mg/0.3 mL IJ SOAJ injection     . pantoprazole (PROTONIX) 40 MG tablet TAKE 1 TABLET BY MOUTH  TWICE A DAY 180 tablet 1  .  phentermine (ADIPEX-P) 37.5 MG tablet Take 37.5 mg by mouth daily before breakfast.     No current facility-administered medications on file prior to visit.    Allergies  Allergen Reactions  . Macadamia Nut Oil Anaphylaxis  . Other Swelling    POPCORN:  SWELLING THROAT  . Peanut-Containing Drug Products Swelling and Other (See Comments)    THROAT TIGHTNESS; ALSO WALNUTS  . Tomato Swelling    SWELLING UVULA  . Adhesive [Tape] Other (See Comments)    BLISTERS  . Flour Other (See Comments)    SNEEZING  . Penicillins Hives  . Hydrocodone Hives  . Oxycodone   . Sulfa Antibiotics Nausea Only  . Vicodin [Hydrocodone-Acetaminophen] Swelling    Pt had flushing, then itching and swelling  of lips  . Eggs Or Egg-Derived Products Other (See Comments)    POSITIVE ON ALLERGY TEST, BUT EATS EGGS WITHOUT PROBLEM  . Soap Other (See Comments)    FRAGRANCES (SOAP/LOTION/PERFUME):  HEADACHE, RUNNY NOSE  . Tramadol Nausea And Vomiting   Social History   Socioeconomic History  . Marital status: Married    Spouse name: Not on file  . Number of children: 4  . Years of education: Not on file  . Highest education level: Not on file  Occupational History    Employer: [employer]    Comment: Teacher, English as a foreign language for [employer]  Social Needs  . Financial resource strain: Not on file  . Food insecurity:    Worry: Not on file    Inability: Not on file  . Transportation needs:    Medical: Not on file    Non-medical: Not on file  Tobacco Use  . Smoking status: Former Smoker    Packs/day: 0.50    Years: 5.00    Pack years: 2.50    Types: Cigarettes    Last attempt to quit: 03/15/2000    Years since quitting: 17.9  . Smokeless tobacco: Never Used  Substance and Sexual Activity  . Alcohol use: No  . Drug use: No    Comment: quit smoking 11 years ago  . Sexual activity: Yes    Birth control/protection: Post-menopausal  Lifestyle  . Physical activity:    Days per week: Not on file    Minutes per session: Not on file  . Stress: Not on file  Relationships  . Social connections:    Talks on phone: Not on file    Gets together: Not on file    Attends religious service: Not on file    Active member of club or organization: Not on file    Attends meetings of clubs or organizations: Not on file    Relationship status: Not on file  . Intimate partner violence:    Fear of current or ex partner: Not on file    Emotionally abused: Not on file    Physically abused: Not on file    Forced sexual activity: Not on file  Other Topics Concern  . Not on file  Social History Narrative  . Not on file     Review of Systems  All other systems reviewed and are negative.      Objective:    Physical Exam Vitals signs reviewed.  Constitutional:      Appearance: She is obese.  Cardiovascular:     Rate and Rhythm: Normal rate and regular rhythm.     Pulses: Normal pulses.  Pulmonary:     Effort: Pulmonary effort is normal. No respiratory distress.  Breath sounds: Normal breath sounds. No stridor. No wheezing, rhonchi or rales.  Abdominal:     General: Bowel sounds are normal. There is no distension.     Palpations: Abdomen is soft.     Tenderness: There is no abdominal tenderness. There is no guarding or rebound.     Hernia: No hernia is present.  Neurological:     Mental Status: She is alert.           Assessment & Plan:  Subclinical hypothyroidism  Other osteoporosis without current pathological fracture  I explained to the patient that the risk of osteonecrosis of the jaw is extremely low and that given her deterioration in her bone density score I would recommend Fosamax 70 mg weekly and then recheck a bone density test in 2 years.  I also recommended that she take calcium and vitamin D as recommended by her gynecologist and recheck a vitamin D level in 6 months.  I suspect that she has subclinical hypothyroidism.  I have recommended that we repeat a TSH, free T3, and free T4 in 6 weeks off the medication to determine what her levels are to determine if she in fact requires the medication.

## 2018-03-03 ENCOUNTER — Ambulatory Visit: Payer: BLUE CROSS/BLUE SHIELD | Admitting: Physical Therapy

## 2018-03-03 DIAGNOSIS — M6281 Muscle weakness (generalized): Secondary | ICD-10-CM | POA: Diagnosis not present

## 2018-03-03 DIAGNOSIS — R262 Difficulty in walking, not elsewhere classified: Secondary | ICD-10-CM | POA: Diagnosis not present

## 2018-03-03 DIAGNOSIS — M25552 Pain in left hip: Secondary | ICD-10-CM | POA: Diagnosis not present

## 2018-03-03 DIAGNOSIS — M79652 Pain in left thigh: Secondary | ICD-10-CM | POA: Diagnosis not present

## 2018-03-03 NOTE — Therapy (Signed)
[place], [place], [address] Phone: [phone]   Fax:  [phone]  Physical Therapy Treatment  Patient Details  Name: Amanda Hoover MRN: [id number] Date of Birth: 18-May-1955 Referring Provider (PT): Jenna Luo, MD   Encounter Date: 03/03/2018  PT End of Session - 03/03/18 0844    Visit Number  2    Number of Visits  8    Date for PT Re-Evaluation  03/21/18    Authorization Type  BCBS-60 visit limit    PT Start Time  0800    PT Stop Time  0853    PT Time Calculation (min)  53 min    Activity Tolerance  Patient tolerated treatment well       Past Medical History:  Diagnosis Date  . Allergy   . Anxiety   . 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  . Breast cancer, right ([place])    ER 0 PR0 LN neg.  . Cancer ([place])   . Chronic headaches   . Chronic ITP (idiopathic thrombocytopenia) (HCC)    mild thrombocytopenia, normal spleen on [place] 11/18, mild cirrhosis  . Dental crowns present   . Depression    no current meds.  Marland Kitchen GERD (gastroesophageal reflux disease)   . Headache(784.0)    stress, sinus headaches  . Heart murmur   . Hiatal hernia   . Hiatal hernia   . History of colon polyps   . History of esophageal stricture    has had esophageal dilation x 1  . History of migraine   . History of radiation therapy 03/28/2012-05/17/2012   60.4 gray to right breast  . Hyperlipidemia   . Multiple environmental allergies   . Osteopenia   . Osteopenia   . Palpitations    occasional; worse with ingestion of caffeine; no cardiologist  . Personal history of radiation therapy 2013   Right Breast Cancer  . Rash 01/06/2012   right elbow  . Rhinitis, allergic    rhinitis  . Thyroid disease     Past Surgical History:  Procedure Laterality Date  . BREAST LUMPECTOMY Right 2013  . COLONOSCOPY    . COLONOSCOPY W/ POLYPECTOMY    . ESOPHAGOGASTRODUODENOSCOPY     . PARTIAL MASTECTOMY WITH NEEDLE LOCALIZATION AND AXILLARY SENTINEL LYMPH NODE BX  01/12/2012   Procedure: PARTIAL MASTECTOMY WITH NEEDLE LOCALIZATION AND AXILLARY SENTINEL LYMPH NODE BX;  Surgeon: Adin Hector, MD;  Location: [facility];  Service: General;  Laterality: Right;  . RE-EXCISION OF BREAST CANCER,SUPERIOR MARGINS  02/15/2012   Procedure: RE-EXCISION OF BREAST CANCER,SUPERIOR MARGINS;  Surgeon: Adin Hector, MD;  Location: [facility];  Service: General;  Laterality: Right;  Re-excision of right lumpectomy, close posterior margin.    There were no vitals filed for this visit.  Subjective Assessment - 03/03/18 0812    Subjective  Pt reports some soreness after needling and it may have helped but she is not sure. She relays pain is a little better in the morning but pain gets worse at night and when trying to lay on her Lt side    Currently in Pain?  Yes    Pain Score  3     Pain Location  Hip    Pain Orientation  Left    Pain Descriptors / Indicators  Sore  [facility] Adult PT Treatment/Exercise - 03/03/18 0001      Knee/Hip Exercises: Stretches   ITB Stretch  Left;3 reps;30 seconds    ITB Stretch Limitations  supine with strap    Piriformis Stretch  Left;3 reps;30 seconds    Piriformis Stretch Limitations  supine       Knee/Hip Exercises: Standing   Other Standing Knee Exercises  standing hip abd, ext, march alternating bilat X15 ea      Knee/Hip Exercises: Supine   Hip Adduction Isometric  Both;15 reps    Hip Adduction Isometric Limitations  5 sec sq    Bridges  15 reps    Bridges Limitations  with green band    Other Supine Knee/Hip Exercises  clamshell green Theraband x 20      Knee/Hip Exercises: Sidelying   Clams  Lt 2X10      Modalities   Modalities  Electrical Stimulation;Moist Heat      Moist Heat Therapy   Number Minutes Moist Heat  15 Minutes    Moist Heat Location  Hip       Electrical Stimulation   Electrical Stimulation Location  Lt hip    Electrical Stimulation Action  IFC    Electrical Stimulation Parameters  tolerance    Electrical Stimulation Goals  Pain      Manual Therapy   Manual therapy comments  IASTM with roller stick to Lt IT band, glutes, piriformis, TFL             PT Education - 03/03/18 [phone]    Education Details  TENS, HEP review, technique for new exercises    Person(s) Educated  Patient    Methods  Explanation;Demonstration;Verbal cues    Comprehension  Verbalized understanding;Returned demonstration          PT Long Term Goals - 02/21/18 2117      PT LONG TERM GOAL #1   Title  Independent with HEP    Baseline  No HEP    Time  4    Period  Weeks    Status  New    Target Date  03/21/18      PT LONG TERM GOAL #2   Title  Increase left hip abduction strength 1/2 MMT grade to improve pelvic stability to decrease pain with ambulation    Baseline  4/5    Time  4    Period  Weeks    Status  New    Target Date  03/21/18      PT LONG TERM GOAL #3   Title  Improve FOTO to 47% or less impairment    Baseline  58% limited    Time  4    Period  Weeks    Status  New    Target Date  03/21/18      PT LONG TERM GOAL #4   Title  Tolerate standing at work for at least 1 hour with left hip pain symptoms decreased 50% or greater from current status    Time  4    Period  Weeks    Status  New    Target Date  03/21/18            Plan - 03/03/18 0845    Clinical Impression Statement  Session focused on HEP review then therex for hip strengthening and stretching to tolerance. She was then trialed on Estim per pt request to reduce pain and tightness. PT will continue to progress toward her functional goals as able.  Rehab Potential  Good    PT Frequency  2x / week    PT Duration  4 weeks    PT Treatment/Interventions  Cryotherapy;Electrical Stimulation;Ultrasound;Moist Heat;Iontophoresis 4mg /ml Dexamethasone;Functional  mobility training;Therapeutic exercise;Therapeutic activities;Neuromuscular re-education;Patient/family education;Manual techniques;Dry needling;ADLs/Self Care Home Management;Taping    PT Next Visit Plan  Review HEP as needed, IT band/lateral hip stretches, hip strengthening, STM/foam roll lateral hip and IT band, further dry needling prn, if needed [place] or ionto    PT Home Exercise Plan  stretches-standing IT band/TFL stretch vs. supine stretch (instructed variation knee flexed), hip abd SLR in standing, clamshell       Patient will benefit from skilled therapeutic intervention in order to improve the following deficits and impairments:  Abnormal gait, Impaired flexibility, Obesity, Difficulty walking, Decreased activity tolerance, Decreased endurance, Decreased strength, Pain  Visit Diagnosis: Pain in left hip  Pain in left thigh  Muscle weakness (generalized)  Difficulty in walking, not elsewhere classified     Problem List Patient Active Problem List   Diagnosis Date Noted  . Chronic ITP (idiopathic thrombocytopenia) (HCC)   . Osteopenia 10/10/2014  . BMI 39.0-39.9,adult 01/01/2014  . Elevated liver enzymes 07/26/2013  . Hot flashes due to tamoxifen 07/26/2013  . Thrombocytopenia ([place]) 01/29/2013  . Chronic headaches   . 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 Chest pain 07/06/2010  . Palpitations 07/06/2010  . GERD 10/02/2008  . OTHER DYSPHAGIA 10/02/2008    Silvestre Mesi 03/03/2018, 8:47 AM  [facility] [address] [place], [place], [address] Phone: [phone]   Fax:  [phone]  Name: Amanda Hoover MRN: [id number] Date of Birth: 1955-09-30

## 2018-03-06 ENCOUNTER — Ambulatory Visit: Payer: BLUE CROSS/BLUE SHIELD | Admitting: Physical Therapy

## 2018-03-06 DIAGNOSIS — M79652 Pain in left thigh: Secondary | ICD-10-CM

## 2018-03-06 DIAGNOSIS — M25552 Pain in left hip: Secondary | ICD-10-CM

## 2018-03-06 DIAGNOSIS — M6281 Muscle weakness (generalized): Secondary | ICD-10-CM | POA: Diagnosis not present

## 2018-03-06 DIAGNOSIS — R262 Difficulty in walking, not elsewhere classified: Secondary | ICD-10-CM | POA: Diagnosis not present

## 2018-03-06 NOTE — Therapy (Signed)
[place], [place], [address] Phone: [phone]   Fax:  [phone]  Physical Therapy Treatment  Patient Details  Name: Amanda Hoover MRN: [id number] Date of Birth: [birth year] Referring Provider (PT): Jenna Luo, MD   Encounter Date: 03/06/2018  PT End of Session - 03/06/18 0808    Visit Number  3    Number of Visits  8    Date for PT Re-Evaluation  03/21/18    Authorization Type  BCBS-60 visit limit    PT Start Time  0800    PT Stop Time  0853    PT Time Calculation (min)  53 min    Activity Tolerance  Patient tolerated treatment well    Behavior During Therapy  [facility] for tasks assessed/performed       Past Medical History:  Diagnosis Date  . Allergy   . Anxiety   . 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  . Breast cancer, right ([place])    ER 0 PR0 LN neg.  . Cancer ([place])   . Chronic headaches   . Chronic ITP (idiopathic thrombocytopenia) (HCC)    mild thrombocytopenia, normal spleen on [place] 11/18, mild cirrhosis  . Dental crowns present   . Depression    no current meds.  Marland Kitchen GERD (gastroesophageal reflux disease)   . Headache(784.0)    stress, sinus headaches  . Heart murmur   . Hiatal hernia   . Hiatal hernia   . History of colon polyps   . History of esophageal stricture    has had esophageal dilation x 1  . History of migraine   . History of radiation therapy 03/28/2012-05/17/2012   60.4 gray to right breast  . Hyperlipidemia   . Multiple environmental allergies   . Osteopenia   . Osteopenia   . Palpitations    occasional; worse with ingestion of caffeine; no cardiologist  . Personal history of radiation therapy 2013   Right Breast Cancer  . Rash 01/06/2012   right elbow  . Rhinitis, allergic    rhinitis  . Thyroid disease     Past Surgical History:  Procedure Laterality Date  . BREAST LUMPECTOMY Right 2013  . COLONOSCOPY    .  COLONOSCOPY W/ POLYPECTOMY    . ESOPHAGOGASTRODUODENOSCOPY    . PARTIAL MASTECTOMY WITH NEEDLE LOCALIZATION AND AXILLARY SENTINEL LYMPH NODE BX  01/12/2012   Procedure: PARTIAL MASTECTOMY WITH NEEDLE LOCALIZATION AND AXILLARY SENTINEL LYMPH NODE BX;  Surgeon: Adin Hector, MD;  Location: [place];  Service: General;  Laterality: Right;  . RE-EXCISION OF BREAST CANCER,SUPERIOR MARGINS  02/15/2012   Procedure: RE-EXCISION OF BREAST CANCER,SUPERIOR MARGINS;  Surgeon: Adin Hector, MD;  Location: [place];  Service: General;  Laterality: Right;  Re-excision of right lumpectomy, close posterior margin.    There were no vitals filed for this visit.  Subjective Assessment - 03/06/18 0807    Subjective  Pt reports she liked the TENS last visit, she did have some shooting pain in her leg while driving to PT today.    Pertinent History  7 month history left lateral hip and thigh pain, pt. also with foot/ankle pain issues    How long can you sit comfortably?  varies depending if sore after standing but generally no limitations    How long can you stand comfortably?  1 hour    How long can you walk comfortably?  varies-depends on  the floor    Diagnostic tests  None    Patient Stated Goals  "To be out of pain to walk and do things."    Currently in Pain?  Yes    Pain Score  5     Pain Location  Hip    Pain Orientation  Left    Pain Descriptors / Indicators  Shooting    Pain Type  Chronic pain    Pain Onset  More than a month ago    Pain Frequency  Intermittent                       [facility] Adult PT Treatment/Exercise - 03/06/18 0001      Exercises   Exercises  Knee/Hip      Knee/Hip Exercises: Stretches   ITB Stretch  Left;3 reps;30 seconds    ITB Stretch Limitations  supine with strap    Piriformis Stretch  Left;3 reps;30 seconds    Piriformis Stretch Limitations  supine     Other Knee/Hip Stretches  LE sciatic nerve glide LAQ with DF,  3 sec X 15 reps sitting      Knee/Hip Exercises: Aerobic   Nustep  5 min L5 UE/LE      Knee/Hip Exercises: Standing   Other Standing Knee Exercises  standing hip abd, ext, march alternating bilat X15 ea      Knee/Hip Exercises: Supine   Hip Adduction Isometric  Both;15 reps    Hip Adduction Isometric Limitations  5 sec sq    Bridges  15 reps    Bridges Limitations  with green band    Other Supine Knee/Hip Exercises  clamshell green Theraband x 20      Knee/Hip Exercises: Sidelying   Clams  Lt X 20      Modalities   Modalities  Electrical Stimulation;Moist Heat      Moist Heat Therapy   Number Minutes Moist Heat  15 Minutes    Moist Heat Location  Hip      Electrical Stimulation   Electrical Stimulation Location  Lt hip    Electrical Stimulation Action  IFC    Electrical Stimulation Parameters  tolerance    Electrical Stimulation Goals  Pain      Manual Therapy   Manual therapy comments  T.P release to glutes,TFL,IT band, LE distraction, posterior hip mobs supine with hip at 90 deg flexion and slight adduction, 30 sec X 3 bouts grade 1-2, IASTM with roller stick to Lt IT band, glutes, piriformis, TFL                  PT Long Term Goals - 02/21/18 2117      PT LONG TERM GOAL #1   Title  Independent with HEP    Baseline  No HEP    Time  4    Period  Weeks    Status  New    Target Date  03/21/18      PT LONG TERM GOAL #2   Title  Increase left hip abduction strength 1/2 MMT grade to improve pelvic stability to decrease pain with ambulation    Baseline  4/5    Time  4    Period  Weeks    Status  New    Target Date  03/21/18      PT LONG TERM GOAL #3   Title  Improve FOTO to 47% or less impairment    Baseline  58% limited  Time  4    Period  Weeks    Status  New    Target Date  03/21/18      PT LONG TERM GOAL #4   Title  Tolerate standing at work for at least 1 hour with left hip pain symptoms decreased 50% or greater from current status    Time   4    Period  Weeks    Status  New    Target Date  03/21/18            Plan - 03/06/18 0845    Clinical Impression Statement  Pt trialed with increased manual therapy for T.P relase, LE distraction, and hip mobs all to decrease soft tissue restrictions, trigger points, and stiffness. This was followed by therex for hip stretching and strengthening with addition of sciatic nerve glide. Session ended with MHP and TENS to further decrease pain and tightness. PT will continue to progress as able.     Rehab Potential  Good    PT Frequency  2x / week    PT Duration  4 weeks    PT Treatment/Interventions  Cryotherapy;Electrical Stimulation;Ultrasound;Moist Heat;Iontophoresis 4mg /ml Dexamethasone;Functional mobility training;Therapeutic exercise;Therapeutic activities;Neuromuscular re-education;Patient/family education;Manual techniques;Dry needling;ADLs/Self Care Home Management;Taping    PT Next Visit Plan  Review HEP as needed, IT band/lateral hip stretches, hip strengthening, STM/foam roll lateral hip and IT band, (she is requesting to discontinue needling as she has had a bruise since)US or ionto pRN    PT Home Exercise Plan  stretches-standing IT band/TFL stretch vs. supine stretch (instructed variation knee flexed), hip abd SLR in standing, clamshell       Patient will benefit from skilled therapeutic intervention in order to improve the following deficits and impairments:  Abnormal gait, Impaired flexibility, Obesity, Difficulty walking, Decreased activity tolerance, Decreased endurance, Decreased strength, Pain  Visit Diagnosis: Pain in left hip  Pain in left thigh  Muscle weakness (generalized)  Difficulty in walking, not elsewhere classified     Problem List Patient Active Problem List   Diagnosis Date Noted  . Chronic ITP (idiopathic thrombocytopenia) (HCC)   . Osteopenia 10/10/2014  . BMI 39.0-39.9,adult 01/01/2014  . Elevated liver enzymes 07/26/2013  . Hot flashes  due to tamoxifen 07/26/2013  . Thrombocytopenia ([place]) 01/29/2013  . Chronic headaches   . 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 Chest pain 07/06/2010  . Palpitations 07/06/2010  . GERD 10/02/2008  . OTHER DYSPHAGIA 10/02/2008    Silvestre Mesi 03/06/2018, 8:48 AM  [facility] [address] [place], [place], [address] Phone: [phone]   Fax:  [phone]  Name: Amanda Hoover MRN: [id number] Date of Birth: 1955-04-14

## 2018-03-10 ENCOUNTER — Ambulatory Visit: Payer: BLUE CROSS/BLUE SHIELD | Admitting: Physical Therapy

## 2018-03-10 ENCOUNTER — Encounter: Payer: Self-pay | Admitting: Physical Therapy

## 2018-03-10 DIAGNOSIS — M6281 Muscle weakness (generalized): Secondary | ICD-10-CM | POA: Diagnosis not present

## 2018-03-10 DIAGNOSIS — M25552 Pain in left hip: Secondary | ICD-10-CM

## 2018-03-10 DIAGNOSIS — R262 Difficulty in walking, not elsewhere classified: Secondary | ICD-10-CM

## 2018-03-10 DIAGNOSIS — M79652 Pain in left thigh: Secondary | ICD-10-CM | POA: Diagnosis not present

## 2018-03-10 NOTE — Therapy (Signed)
[place], [place], [address] Phone: [phone]   Fax:  [phone]  Physical Therapy Treatment  Patient Details  Name: Amanda Hoover MRN: [id number] Date of Birth: [birth year] Referring Provider (PT): Jenna Luo, MD   Encounter Date: 03/10/2018  PT End of Session - 03/10/18 0804    Visit Number  4    Number of Visits  8    Date for PT Re-Evaluation  03/21/18    Authorization Type  BCBS-60 visit limit    PT Start Time  0800    PT Stop Time  0853    PT Time Calculation (min)  53 min    Activity Tolerance  Patient tolerated treatment well    Behavior During Therapy  [facility] for tasks assessed/performed       Past Medical History:  Diagnosis Date  . Allergy   . Anxiety   . 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  . Breast cancer, right ([place])    ER 0 PR0 LN neg.  . Cancer ([place])   . Chronic headaches   . Chronic ITP (idiopathic thrombocytopenia) (HCC)    mild thrombocytopenia, normal spleen on [place] 11/18, mild cirrhosis  . Dental crowns present   . Depression    no current meds.  Marland Kitchen GERD (gastroesophageal reflux disease)   . Headache(784.0)    stress, sinus headaches  . Heart murmur   . Hiatal hernia   . Hiatal hernia   . History of colon polyps   . History of esophageal stricture    has had esophageal dilation x 1  . History of migraine   . History of radiation therapy 03/28/2012-05/17/2012   60.4 gray to right breast  . Hyperlipidemia   . Multiple environmental allergies   . Osteopenia   . Osteopenia   . Palpitations    occasional; worse with ingestion of caffeine; no cardiologist  . Personal history of radiation therapy 2013   Right Breast Cancer  . Rash 01/06/2012   right elbow  . Rhinitis, allergic    rhinitis  . Thyroid disease     Past Surgical History:  Procedure Laterality Date  . BREAST LUMPECTOMY Right 2013  . COLONOSCOPY    .  COLONOSCOPY W/ POLYPECTOMY    . ESOPHAGOGASTRODUODENOSCOPY    . PARTIAL MASTECTOMY WITH NEEDLE LOCALIZATION AND AXILLARY SENTINEL LYMPH NODE BX  01/12/2012   Procedure: PARTIAL MASTECTOMY WITH NEEDLE LOCALIZATION AND AXILLARY SENTINEL LYMPH NODE BX;  Surgeon: Adin Hector, MD;  Location: [place];  Service: General;  Laterality: Right;  . RE-EXCISION OF BREAST CANCER,SUPERIOR MARGINS  02/15/2012   Procedure: RE-EXCISION OF BREAST CANCER,SUPERIOR MARGINS;  Surgeon: Adin Hector, MD;  Location: [place];  Service: General;  Laterality: Right;  Re-excision of right lumpectomy, close posterior margin.    There were no vitals filed for this visit.  Subjective Assessment - 03/10/18 0759    Subjective  Pt reports she was sore after last session and she had 2 falls this week where she slipped so this may be adding to the soreness in her hip. She also relays this could be nerve pain as it just comes on all the sudden and shoots down her leg    Currently in Pain?  Yes    Pain Score  6     Pain Location  Hip    Pain Orientation  Left    Pain Descriptors / Indicators  Sore    Pain Onset  More than a month ago    Pain Frequency  Intermittent                       [facility] Adult PT Treatment/Exercise - 03/10/18 0001      Exercises   Exercises  Knee/Hip      Knee/Hip Exercises: Stretches   ITB Stretch  Left;2 reps;30 seconds    ITB Stretch Limitations  supine with strap    Piriformis Stretch  Left;2 reps;30 seconds    Piriformis Stretch Limitations  supine     Other Knee/Hip Stretches  LE sciatic nerve glide LAQ with DF, 3 sec X 15 reps sitting      Knee/Hip Exercises: Aerobic   Nustep  6 min L5 UE/LE      Knee/Hip Exercises: Standing   Other Standing Knee Exercises  standing lumbar ext X 10    Other Standing Knee Exercises  standing hip abd, ext, march alternating bilat X10 ea      Knee/Hip Exercises: Supine   Bridges  10 reps   3 sec  hold   Other Supine Knee/Hip Exercises  clamshell green Theraband x 15      Modalities   Modalities  Electrical Stimulation;Iontophoresis;Moist Heat      Moist Heat Therapy   Number Minutes Moist Heat  15 Minutes    Moist Heat Location  Hip      Electrical Stimulation   Electrical Stimulation Location  Lt hip    Electrical Stimulation Action  IFC    Electrical Stimulation Parameters  tolerance    Electrical Stimulation Goals  Pain      Iontophoresis   Type of Iontophoresis  Dexamethasone    Location  Lt hip    Dose  80 mAmp    Time  6 hour take home patch             PT Education - 03/10/18 0837    Education Details  IONTO    Person(s) Educated  Patient    Methods  Explanation    Comprehension  Verbalized understanding          PT Long Term Goals - 02/21/18 2117      PT LONG TERM GOAL #1   Title  Independent with HEP    Baseline  No HEP    Time  4    Period  Weeks    Status  New    Target Date  03/21/18      PT LONG TERM GOAL #2   Title  Increase left hip abduction strength 1/2 MMT grade to improve pelvic stability to decrease pain with ambulation    Baseline  4/5    Time  4    Period  Weeks    Status  New    Target Date  03/21/18      PT LONG TERM GOAL #3   Title  Improve FOTO to 47% or less impairment    Baseline  58% limited    Time  4    Period  Weeks    Status  New    Target Date  03/21/18      PT LONG TERM GOAL #4   Title  Tolerate standing at work for at least 1 hour with left hip pain symptoms decreased 50% or greater from current status    Time  4    Period  Weeks    Status  New  Target Date  03/21/18            Plan - 03/10/18 9604    Clinical Impression Statement  Pt was more sore today so reps were backed off some with therex also T.P relase was held due to soreness. She was trialed with IONTO today to decrease pain and inflammation in her Lt hip.     Rehab Potential  Good    PT Frequency  2x / week    PT Duration  4  weeks    PT Treatment/Interventions  Cryotherapy;Electrical Stimulation;Ultrasound;Moist Heat;Iontophoresis 4mg /ml Dexamethasone;Functional mobility training;Therapeutic exercise;Therapeutic activities;Neuromuscular re-education;Patient/family education;Manual techniques;Dry needling;ADLs/Self Care Home Management;Taping    PT Next Visit Plan  Review HEP as needed, IT band/lateral hip stretches, hip strengthening, STM/foam roll lateral hip and IT band, (she is requesting to discontinue needling as she has had a bruise since)US or ionto pRN    PT Home Exercise Plan  stretches-standing IT band/TFL stretch vs. supine stretch (instructed variation knee flexed), hip abd SLR in standing, clamshell       Patient will benefit from skilled therapeutic intervention in order to improve the following deficits and impairments:  Abnormal gait, Impaired flexibility, Obesity, Difficulty walking, Decreased activity tolerance, Decreased endurance, Decreased strength, Pain  Visit Diagnosis: Pain in left hip  Pain in left thigh  Muscle weakness (generalized)  Difficulty in walking, not elsewhere classified     Problem List Patient Active Problem List   Diagnosis Date Noted  . Chronic ITP (idiopathic thrombocytopenia) (HCC)   . Osteopenia 10/10/2014  . BMI 39.0-39.9,adult 01/01/2014  . Elevated liver enzymes 07/26/2013  . Hot flashes due to tamoxifen 07/26/2013  . Thrombocytopenia ([place]) 01/29/2013  . Chronic headaches   . 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 Chest pain 07/06/2010  . Palpitations 07/06/2010  . GERD 10/02/2008  . OTHER DYSPHAGIA 10/02/2008    Debbe Odea ,PT,DPT 03/10/2018, 8:40 AM  [facility] [address] [place], [place], [address] Phone: [phone]   Fax:  [phone]  Name: LINCOLN KLEINER MRN: [id number] Date of Birth: 04/14/1955

## 2018-03-13 ENCOUNTER — Ambulatory Visit: Payer: BLUE CROSS/BLUE SHIELD | Admitting: Physical Therapy

## 2018-03-13 DIAGNOSIS — R262 Difficulty in walking, not elsewhere classified: Secondary | ICD-10-CM | POA: Diagnosis not present

## 2018-03-13 DIAGNOSIS — Z1283 Encounter for screening for malignant neoplasm of skin: Secondary | ICD-10-CM | POA: Diagnosis not present

## 2018-03-13 DIAGNOSIS — M25552 Pain in left hip: Secondary | ICD-10-CM

## 2018-03-13 DIAGNOSIS — M6281 Muscle weakness (generalized): Secondary | ICD-10-CM | POA: Diagnosis not present

## 2018-03-13 DIAGNOSIS — L218 Other seborrheic dermatitis: Secondary | ICD-10-CM | POA: Diagnosis not present

## 2018-03-13 DIAGNOSIS — M79652 Pain in left thigh: Secondary | ICD-10-CM

## 2018-03-13 DIAGNOSIS — D225 Melanocytic nevi of trunk: Secondary | ICD-10-CM | POA: Diagnosis not present

## 2018-03-13 NOTE — Therapy (Signed)
[place], [place], [address] Phone: [phone]   Fax:  [phone]  Physical Therapy Treatment  Patient Details  Name: Amanda Hoover MRN: [id number] Date of Birth: [birth year] Referring Provider (PT): Jenna Luo, MD   Encounter Date: 03/13/2018  PT End of Session - 03/13/18 0826    Visit Number  5    Number of Visits  8    Date for PT Re-Evaluation  03/21/18    Authorization Type  BCBS-60 visit limit    PT Start Time  0800    PT Stop Time  0853    PT Time Calculation (min)  53 min    Activity Tolerance  Patient tolerated treatment well    Behavior During Therapy  [facility] for tasks assessed/performed       Past Medical History:  Diagnosis Date  . Allergy   . Anxiety   . 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  . Breast cancer, right ([place])    ER 0 PR0 LN neg.  . Cancer ([place])   . Chronic headaches   . Chronic ITP (idiopathic thrombocytopenia) (HCC)    mild thrombocytopenia, normal spleen on [place] 11/18, mild cirrhosis  . Dental crowns present   . Depression    no current meds.  Marland Kitchen GERD (gastroesophageal reflux disease)   . Headache(784.0)    stress, sinus headaches  . Heart murmur   . Hiatal hernia   . Hiatal hernia   . History of colon polyps   . History of esophageal stricture    has had esophageal dilation x 1  . History of migraine   . History of radiation therapy 03/28/2012-05/17/2012   60.4 gray to right breast  . Hyperlipidemia   . Multiple environmental allergies   . Osteopenia   . Osteopenia   . Palpitations    occasional; worse with ingestion of caffeine; no cardiologist  . Personal history of radiation therapy 2013   Right Breast Cancer  . Rash 01/06/2012   right elbow  . Rhinitis, allergic    rhinitis  . Thyroid disease     Past Surgical History:  Procedure Laterality Date  . BREAST LUMPECTOMY Right 2013  . COLONOSCOPY    .  COLONOSCOPY W/ POLYPECTOMY    . ESOPHAGOGASTRODUODENOSCOPY    . PARTIAL MASTECTOMY WITH NEEDLE LOCALIZATION AND AXILLARY SENTINEL LYMPH NODE BX  01/12/2012   Procedure: PARTIAL MASTECTOMY WITH NEEDLE LOCALIZATION AND AXILLARY SENTINEL LYMPH NODE BX;  Surgeon: Adin Hector, MD;  Location: [place];  Service: General;  Laterality: Right;  . RE-EXCISION OF BREAST CANCER,SUPERIOR MARGINS  02/15/2012   Procedure: RE-EXCISION OF BREAST CANCER,SUPERIOR MARGINS;  Surgeon: Adin Hector, MD;  Location: [place];  Service: General;  Laterality: Right;  Re-excision of right lumpectomy, close posterior margin.    There were no vitals filed for this visit.  Subjective Assessment - 03/13/18 0812    Subjective  Pt relays the IONTO patch really helped with her pain. She does still have some intermittent shooting pains down her Lt leg    Currently in Pain?  Yes    Pain Score  2     Pain Location  Hip    Pain Orientation  Left    Pain Descriptors / Indicators  Shooting    Pain Type  Chronic pain    Pain Frequency  Intermittent         [facility] PT Assessment -  03/13/18 0001      Assessment   Medical Diagnosis  Left IT band syndrome/Trochanteric bursitis    Referring Provider (PT)  Jenna Luo, MD    Onset Date/Surgical Date  07/22/17    Hand Dominance  Right    Next MD Visit  4 more weeks      AROM   Overall AROM Comments  Bilat. hip AROM grossly [facility]      Strength   Left Hip Flexion  4+/5    Left Hip Extension  4/5    Left Hip External Rotation  4+/5    Left Hip Internal Rotation  4+/5    Left Hip ABduction  4+/5    Left Knee Flexion  5/5    Left Knee Extension  5/5                   [facility] Adult PT Treatment/Exercise - 03/13/18 0001      Exercises   Exercises  Knee/Hip      Knee/Hip Exercises: Stretches   ITB Stretch  Left;3 reps;30 seconds    ITB Stretch Limitations  supine with strap    Piriformis Stretch  Left;3 reps;30 seconds     Piriformis Stretch Limitations  supine     Other Knee/Hip Stretches  LE sciatic nerve glide LAQ with DF, 3 sec X 15 reps sitting      Knee/Hip Exercises: Aerobic   Nustep  6 min L5 UE/LE      Knee/Hip Exercises: Standing   Other Standing Knee Exercises  standing lumbar ext X 10    Other Standing Knee Exercises  standing hip abd, ext, march alternating bilat X10 ea      Knee/Hip Exercises: Supine   Bridges  15 reps    Bridges Limitations  3 sec hold    Other Supine Knee/Hip Exercises  clamshell green Theraband x 20      Modalities   Modalities  Electrical Stimulation;Iontophoresis;Moist Heat      Moist Heat Therapy   Number Minutes Moist Heat  15 Minutes    Moist Heat Location  Hip      Electrical Stimulation   Electrical Stimulation Location  Lt hip    Electrical Stimulation Action  IFC    Electrical Stimulation Parameters  tolerance    Electrical Stimulation Goals  Pain      Iontophoresis   Type of Iontophoresis  Dexamethasone    Location  Lt hip    Dose  80 mAmp    Time  6 hour take home patch                  PT Long Term Goals - 03/13/18 8182      PT LONG TERM GOAL #1   Title  Independent with HEP    Time  4    Period  Weeks    Status  Partially Met      PT LONG TERM GOAL #2   Title  Increase left hip abduction strength 1/2 MMT grade to improve pelvic stability to decrease pain with ambulation    Baseline  4/5    Time  4    Period  Weeks    Status  Achieved      PT LONG TERM GOAL #3   Title  Improve FOTO to 47% or less impairment    Baseline  58% limited    Time  4    Period  Weeks    Status  On-going  PT LONG TERM GOAL #4   Title  Tolerate standing at work for at least 1 hour with left hip pain symptoms decreased 50% or greater from current status    Time  4    Period  Weeks    Status  On-going            Plan - 03/13/18 0827    Clinical Impression Statement  Pt able to slowly add back exercise progression some today with  good tolerance. Strength was retested today for he Lt hip which do show improvments in strength all planes. IONTO and TENS resumed post tx as she relays this helps reduce pain.     Rehab Potential  Good    PT Frequency  2x / week    PT Duration  4 weeks    PT Treatment/Interventions  Cryotherapy;Electrical Stimulation;Ultrasound;Moist Heat;Iontophoresis '4mg'$ /ml Dexamethasone;Functional mobility training;Therapeutic exercise;Therapeutic activities;Neuromuscular re-education;Patient/family education;Manual techniques;Dry needling;ADLs/Self Care Home Management;Taping    PT Next Visit Plan  Review HEP as needed, IT band/lateral hip stretches, hip strengthening, STM/foam roll lateral hip and IT band, (she is requesting to discontinue needling as she has had a bruise since)US or ionto pRN    PT Home Exercise Plan  stretches-standing IT band/TFL stretch vs. supine stretch (instructed variation knee flexed), hip abd SLR in standing, clamshell       Patient will benefit from skilled therapeutic intervention in order to improve the following deficits and impairments:  Abnormal gait, Impaired flexibility, Obesity, Difficulty walking, Decreased activity tolerance, Decreased endurance, Decreased strength, Pain  Visit Diagnosis: Pain in left hip  Pain in left thigh  Muscle weakness (generalized)  Difficulty in walking, not elsewhere classified     Problem List Patient Active Problem List   Diagnosis Date Noted  . Chronic ITP (idiopathic thrombocytopenia) (HCC)   . Osteopenia 10/10/2014  . BMI 39.0-39.9,adult 01/01/2014  . Elevated liver enzymes 07/26/2013  . Hot flashes due to tamoxifen 07/26/2013  . Thrombocytopenia ([place]) 01/29/2013  . Chronic headaches   . 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 Chest pain 07/06/2010  . Palpitations 07/06/2010  . GERD 10/02/2008  . OTHER DYSPHAGIA 10/02/2008    Silvestre Mesi 03/13/2018, 8:40 AM  [facility] [address] [place], [place], [address] Phone: [phone]   Fax:  [phone]  Name: Amanda Hoover MRN: [id number] Date of Birth: August 17, 1955

## 2018-03-16 ENCOUNTER — Encounter: Payer: Self-pay | Admitting: *Deleted

## 2018-03-17 ENCOUNTER — Ambulatory Visit: Payer: BLUE CROSS/BLUE SHIELD | Attending: Family Medicine | Admitting: Physical Therapy

## 2018-03-17 DIAGNOSIS — M25552 Pain in left hip: Secondary | ICD-10-CM | POA: Diagnosis not present

## 2018-03-17 DIAGNOSIS — M6281 Muscle weakness (generalized): Secondary | ICD-10-CM | POA: Insufficient documentation

## 2018-03-17 DIAGNOSIS — R262 Difficulty in walking, not elsewhere classified: Secondary | ICD-10-CM | POA: Insufficient documentation

## 2018-03-17 DIAGNOSIS — M79652 Pain in left thigh: Secondary | ICD-10-CM | POA: Diagnosis not present

## 2018-03-17 NOTE — Therapy (Signed)
[place], [place], [address] Phone: [phone]   Fax:  [phone]  Physical Therapy Treatment  Patient Details  Name: Amanda Hoover MRN: [id number] Date of Birth: 1955-05-11 Referring Provider (PT): Jenna Luo, MD   Encounter Date: 03/17/2018  PT End of Session - 03/17/18 0848    Visit Number  6    Number of Visits  8    Date for PT Re-Evaluation  03/21/18    Authorization Type  BCBS-60 visit limit    PT Start Time  0807    PT Stop Time  0900    PT Time Calculation (min)  53 min    Activity Tolerance  Patient tolerated treatment well    Behavior During Therapy  [facility] for tasks assessed/performed       Past Medical History:  Diagnosis Date  . Allergy   . Anxiety   . 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  . Breast cancer, right ([place])    ER 0 PR0 LN neg.  . Cancer ([place])   . Chronic headaches   . Chronic ITP (idiopathic thrombocytopenia) (HCC)    mild thrombocytopenia, normal spleen on [place] 11/18, mild cirrhosis  . Dental crowns present   . Depression    no current meds.  Marland Kitchen GERD (gastroesophageal reflux disease)   . Headache(784.0)    stress, sinus headaches  . Heart murmur   . Hiatal hernia   . Hiatal hernia   . History of colon polyps   . History of esophageal stricture    has had esophageal dilation x 1  . History of migraine   . History of radiation therapy 03/28/2012-05/17/2012   60.4 gray to right breast  . Hyperlipidemia   . Multiple environmental allergies   . Osteopenia   . Osteopenia   . Palpitations    occasional; worse with ingestion of caffeine; no cardiologist  . Personal history of radiation therapy 2013   Right Breast Cancer  . Rash 01/06/2012   right elbow  . Rhinitis, allergic    rhinitis  . Thyroid disease     Past Surgical History:  Procedure Laterality Date  . BREAST LUMPECTOMY Right 2013  . COLONOSCOPY    .  COLONOSCOPY W/ POLYPECTOMY    . ESOPHAGOGASTRODUODENOSCOPY    . PARTIAL MASTECTOMY WITH NEEDLE LOCALIZATION AND AXILLARY SENTINEL LYMPH NODE BX  01/12/2012   Procedure: PARTIAL MASTECTOMY WITH NEEDLE LOCALIZATION AND AXILLARY SENTINEL LYMPH NODE BX;  Surgeon: Adin Hector, MD;  Location: [place];  Service: General;  Laterality: Right;  . RE-EXCISION OF BREAST CANCER,SUPERIOR MARGINS  02/15/2012   Procedure: RE-EXCISION OF BREAST CANCER,SUPERIOR MARGINS;  Surgeon: Adin Hector, MD;  Location: [place];  Service: General;  Laterality: Right;  Re-excision of right lumpectomy, close posterior margin.    There were no vitals filed for this visit.  Subjective Assessment - 03/17/18 0814    Subjective  Pt relays her hip feels the best it has felt in a long time, still had some minor hip pain driving, but now dealing with some plantarfascia pain    How long can you sit comfortably?  varies depending if sore after standing but generally no limitations    Currently in Pain?  Yes    Pain Score  1     Pain Location  Hip    Pain Orientation  Left    Pain Descriptors / Indicators  Aching  [facility] Adult PT Treatment/Exercise - 03/17/18 0001      Knee/Hip Exercises: Stretches   ITB Stretch  Left;3 reps;30 seconds    ITB Stretch Limitations  supine with strap    Piriformis Stretch  Left;3 reps;30 seconds    Piriformis Stretch Limitations  supine     Other Knee/Hip Stretches  LE sciatic nerve glide LAQ with DF, 3 sec X 15 reps sitting    Other Knee/Hip Stretches  standing gastroc stretch and sitting Plantar fascia stretch 30 sec X 2 ea      Knee/Hip Exercises: Aerobic   Nustep  6 min L5 UE/LE      Knee/Hip Exercises: Standing   Other Standing Knee Exercises  standing hip abd, ext, march alternating bilat X15 ea      Knee/Hip Exercises: Supine   Bridges  15 reps    Bridges Limitations  3 sec hold    Other Supine Knee/Hip  Exercises  clamshell green Theraband x 20      Modalities   Modalities  Electrical Stimulation;Iontophoresis;Moist Heat      Moist Heat Therapy   Number Minutes Moist Heat  15 Minutes    Moist Heat Location  Hip      Electrical Stimulation   Electrical Stimulation Location  Lt hip    Electrical Stimulation Action  IFC    Electrical Stimulation Parameters  tolerance    Electrical Stimulation Goals  Pain      Iontophoresis   Type of Iontophoresis  Dexamethasone    Location  Lt hip    Dose  80 mAmp    Time  6 hour take home patch             PT Education - 03/17/18 0848    Education Details  stretches for plantar fascia    Person(s) Educated  Patient    Methods  Explanation;Demonstration;Verbal cues    Comprehension  Verbalized understanding;Returned demonstration          PT Long Term Goals - 03/13/18 1856      PT LONG TERM GOAL #1   Title  Independent with HEP    Time  4    Period  Weeks    Status  Partially Met      PT LONG TERM GOAL #2   Title  Increase left hip abduction strength 1/2 MMT grade to improve pelvic stability to decrease pain with ambulation    Baseline  4/5    Time  4    Period  Weeks    Status  Achieved      PT LONG TERM GOAL #3   Title  Improve FOTO to 47% or less impairment    Baseline  58% limited    Time  4    Period  Weeks    Status  On-going      PT LONG TERM GOAL #4   Title  Tolerate standing at work for at least 1 hour with left hip pain symptoms decreased 50% or greater from current status    Time  4    Period  Weeks    Status  On-going            Plan - 03/17/18 0848    Clinical Impression Statement  Pt albe to tolerate gentle progression of therex today without complaints or increased pain and she is making steady progress in all areas. IONTO and TENS again applied as she relays this has calmed her pain down the most it has  been in a long time. She is appearing to develop some plantar fasciiitis so she was shown  stretching to do at home for this.     Rehab Potential  Good    PT Frequency  2x / week    PT Duration  4 weeks    PT Treatment/Interventions  Cryotherapy;Electrical Stimulation;Ultrasound;Moist Heat;Iontophoresis 32m/ml Dexamethasone;Functional mobility training;Therapeutic exercise;Therapeutic activities;Neuromuscular re-education;Patient/family education;Manual techniques;Dry needling;ADLs/Self Care Home Management;Taping    PT Next Visit Plan  Review HEP as needed, IT band/lateral hip stretches, hip strengthening, STM/foam roll lateral hip and IT band, (she is requesting to discontinue needling as she has had a bruise since)US or ionto pRN    Consulted and Agree with Plan of Care  Patient       Patient will benefit from skilled therapeutic intervention in order to improve the following deficits and impairments:  Abnormal gait, Impaired flexibility, Obesity, Difficulty walking, Decreased activity tolerance, Decreased endurance, Decreased strength, Pain  Visit Diagnosis: Pain in left hip  Pain in left thigh  Muscle weakness (generalized)  Difficulty in walking, not elsewhere classified     Problem List Patient Active Problem List   Diagnosis Date Noted  . Chronic ITP (idiopathic thrombocytopenia) (HCC)   . Osteopenia 10/10/2014  . BMI 39.0-39.9,adult 01/01/2014  . Elevated liver enzymes 07/26/2013  . Hot flashes due to tamoxifen 07/26/2013  . Thrombocytopenia ([place] 01/29/2013  . Chronic headaches   . 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 Chest pain 07/06/2010  . Palpitations 07/06/2010  . GERD 10/02/2008  . OTHER DYSPHAGIA 10/02/2008    BSilvestre Mesi1/05/2018, 8:51 AM  [facility] [address]Phone: [phone]  Fax:  [phone] Name: Amanda TUNMRN: [id number] of Birth: 125-Nov-1957

## 2018-03-20 ENCOUNTER — Ambulatory Visit: Payer: BLUE CROSS/BLUE SHIELD | Admitting: Physical Therapy

## 2018-03-23 ENCOUNTER — Ambulatory Visit: Payer: BLUE CROSS/BLUE SHIELD

## 2018-03-23 DIAGNOSIS — M25552 Pain in left hip: Secondary | ICD-10-CM | POA: Diagnosis not present

## 2018-03-23 DIAGNOSIS — R262 Difficulty in walking, not elsewhere classified: Secondary | ICD-10-CM | POA: Diagnosis not present

## 2018-03-23 DIAGNOSIS — M6281 Muscle weakness (generalized): Secondary | ICD-10-CM | POA: Diagnosis not present

## 2018-03-23 DIAGNOSIS — M79652 Pain in left thigh: Secondary | ICD-10-CM

## 2018-03-23 NOTE — Therapy (Signed)
[place], [place], [address] Phone: [phone]   Fax:  [phone]  Physical Therapy Treatment  Patient Details  Name: Amanda Hoover MRN: [id number] Date of Birth: 1956/03/02 Referring Provider (PT): Jenna Luo, MD   Encounter Date: 03/23/2018  PT End of Session - 03/23/18 0841    Visit Number  7    Number of Visits  11    Date for PT Re-Evaluation  03/21/18    Authorization Type  BCBS-60 visit limit    PT Start Time  0803    PT Stop Time  0843    PT Time Calculation (min)  40 min    Activity Tolerance  Patient tolerated treatment well    Behavior During Therapy  [facility] for tasks assessed/performed       Past Medical History:  Diagnosis Date  . Allergy   . Anxiety   . 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  . Breast cancer, right ([place])    ER 0 PR0 LN neg.  . Cancer ([place])   . Chronic headaches   . Chronic ITP (idiopathic thrombocytopenia) (HCC)    mild thrombocytopenia, normal spleen on [place] 11/18, mild cirrhosis  . Dental crowns present   . Depression    no current meds.  Marland Kitchen GERD (gastroesophageal reflux disease)   . Headache(784.0)    stress, sinus headaches  . Heart murmur   . Hiatal hernia   . Hiatal hernia   . History of colon polyps   . History of esophageal stricture    has had esophageal dilation x 1  . History of migraine   . History of radiation therapy 03/28/2012-05/17/2012   60.4 gray to right breast  . Hyperlipidemia   . Multiple environmental allergies   . Osteopenia   . Osteopenia   . Palpitations    occasional; worse with ingestion of caffeine; no cardiologist  . Personal history of radiation therapy 2013   Right Breast Cancer  . Rash 01/06/2012   right elbow  . Rhinitis, allergic    rhinitis  . Thyroid disease     Past Surgical History:  Procedure Laterality Date  . BREAST LUMPECTOMY Right 2013  . COLONOSCOPY    .  COLONOSCOPY W/ POLYPECTOMY    . ESOPHAGOGASTRODUODENOSCOPY    . PARTIAL MASTECTOMY WITH NEEDLE LOCALIZATION AND AXILLARY SENTINEL LYMPH NODE BX  01/12/2012   Procedure: PARTIAL MASTECTOMY WITH NEEDLE LOCALIZATION AND AXILLARY SENTINEL LYMPH NODE BX;  Surgeon: Adin Hector, MD;  Location: [place];  Service: General;  Laterality: Right;  . RE-EXCISION OF BREAST CANCER,SUPERIOR MARGINS  02/15/2012   Procedure: RE-EXCISION OF BREAST CANCER,SUPERIOR MARGINS;  Surgeon: Adin Hector, MD;  Location: [place];  Service: General;  Laterality: Right;  Re-excision of right lumpectomy, close posterior margin.    There were no vitals filed for this visit.  Subjective Assessment - 03/23/18 0807    Subjective  No pain now. Shooting pain almost gone and She feels the ionto patch has helped    Currently in Pain?  No/denies                       [facility] Adult PT Treatment/Exercise - 03/23/18 0001      Knee/Hip Exercises: Stretches   ITB Stretch  Left;3 reps;30 seconds    ITB Stretch Limitations  manual assist in side lye    Piriformis Stretch  Left;3  reps;30 seconds    Other Knee/Hip Stretches  LE sciatic nerve glide LAQ with DF, 3 sec X 15 reps sitting    Other Knee/Hip Stretches  standing gastroc stretch and sitting Plantar fascia stretch 30 sec X 2 ea      Knee/Hip Exercises: Aerobic   Nustep  6 min L6  UE/LE      Knee/Hip Exercises: Standing   Other Standing Knee Exercises  standing hip abd, ext, march alternating bilat X15 ea      Knee/Hip Exercises: Supine   Bridges  20 reps    Bridges Limitations  3 sec hold    Other Supine Knee/Hip Exercises  clamshell green Theraband x 20      Iontophoresis   Type of Iontophoresis  Dexamethasone    Location  Lt hip    Dose  80 mAmp    Time  4-6 hour take home patch                  PT Long Term Goals - 03/23/18 0845      PT LONG TERM GOAL #1   Title  Independent with HEP    Status   Achieved      PT LONG TERM GOAL #2   Title  Increase left hip abduction strength 1/2 MMT grade to improve pelvic stability to decrease pain with ambulation    Baseline  4+/5    Status  Achieved      PT LONG TERM GOAL #3   Title  Improve FOTO to 47% or less impairment    Status  On-going      PT LONG TERM GOAL #4   Title  Tolerate standing at work for at least 1 hour with left hip pain symptoms decreased 50% or greater from current status    Status  Achieved            Plan - 03/23/18 0842    Clinical Impression Statement  Doing very well now.  Will hold last visit so she can call before 04/24/18 and be seen again    PT Treatment/Interventions  Cryotherapy;Electrical Stimulation;Ultrasound;Moist Heat;Iontophoresis 4mg /ml Dexamethasone;Functional mobility training;Therapeutic exercise;Therapeutic activities;Neuromuscular re-education;Patient/family education;Manual techniques;Dry needling;ADLs/Self Care Home Management;Taping    PT Next Visit Plan  hold and if she needs  last session she will call  otherwise she will be discharged  on 04/24/18    PT Home Exercise Plan  stretches-standing IT band/TFL stretch vs. supine stretch (instructed variation knee flexed), hip abd SLR in standing, clamshell    Consulted and Agree with Plan of Care  Patient       Patient will benefit from skilled therapeutic intervention in order to improve the following deficits and impairments:  Abnormal gait, Impaired flexibility, Obesity, Difficulty walking, Decreased activity tolerance, Decreased endurance, Decreased strength, Pain  Visit Diagnosis: Pain in left hip  Pain in left thigh  Muscle weakness (generalized)  Difficulty in walking, not elsewhere classified     Problem List Patient Active Problem List   Diagnosis Date Noted  . Chronic ITP (idiopathic thrombocytopenia) (HCC)   . Osteopenia 10/10/2014  . BMI 39.0-39.9,adult 01/01/2014  . Elevated liver enzymes 07/26/2013  . Hot flashes  due to tamoxifen 07/26/2013  . Thrombocytopenia ([place]) 01/29/2013  . Chronic headaches   . 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 Chest pain 07/06/2010  . Palpitations 07/06/2010  . GERD 10/02/2008  . OTHER DYSPHAGIA 10/02/2008    Darrel Hoover  PT  03/23/2018, 8:47 AM  [place], [place], [address] Phone: [phone]   Fax:  [phone]  Name: Amanda Hoover MRN: [id number] Date of Birth: 10/10/1955

## 2018-04-01 ENCOUNTER — Other Ambulatory Visit: Payer: Self-pay | Admitting: *Deleted

## 2018-04-01 ENCOUNTER — Encounter: Payer: Self-pay | Admitting: *Deleted

## 2018-04-01 MED ORDER — ALENDRONATE SODIUM 70 MG PO TABS: 70.0000 mg | ORAL_TABLET | ORAL | 11 refills | Status: DC

## 2018-04-08 ENCOUNTER — Other Ambulatory Visit: Payer: Self-pay

## 2018-04-08 ENCOUNTER — Encounter (HOSPITAL_COMMUNITY): Payer: Self-pay | Admitting: *Deleted

## 2018-04-08 ENCOUNTER — Emergency Department (HOSPITAL_COMMUNITY)
Admission: EM | Admit: 2018-04-08 | Discharge: 2018-04-08 | Disposition: A | Discharge status: Home / Self Care | Payer: BLUE CROSS/BLUE SHIELD | Attending: Emergency Medicine | Admitting: Emergency Medicine

## 2018-04-08 DIAGNOSIS — Z79899 Other long term (current) drug therapy: Secondary | ICD-10-CM | POA: Insufficient documentation

## 2018-04-08 DIAGNOSIS — L03221 Cellulitis of neck: Secondary | ICD-10-CM

## 2018-04-08 DIAGNOSIS — Z853 Personal history of malignant neoplasm of breast: Secondary | ICD-10-CM | POA: Diagnosis not present

## 2018-04-08 DIAGNOSIS — E785 Hyperlipidemia, unspecified: Secondary | ICD-10-CM | POA: Diagnosis not present

## 2018-04-08 DIAGNOSIS — R221 Localized swelling, mass and lump, neck: Secondary | ICD-10-CM | POA: Diagnosis not present

## 2018-04-08 DIAGNOSIS — Z87891 Personal history of nicotine dependence: Secondary | ICD-10-CM | POA: Insufficient documentation

## 2018-04-08 LAB — CBC WITH DIFFERENTIAL/PLATELET
Abs Immature Granulocytes: 0.03 10*3/uL (ref 0.00–0.07)
Basophils Absolute: 0 10*3/uL (ref 0.0–0.1)
Basophils Relative: 1 %
Eosinophils Absolute: 0.1 10*3/uL (ref 0.0–0.5)
Eosinophils Relative: 1 %
HCT: 44.2 % (ref 36.0–46.0)
Hemoglobin: 14.6 g/dL (ref 12.0–15.0)
Immature Granulocytes: 0 %
Lymphocytes Relative: 13 %
Lymphs Abs: 1 10*3/uL (ref 0.7–4.0)
MCH: 31.8 pg (ref 26.0–34.0)
MCHC: 33 g/dL (ref 30.0–36.0)
MCV: 96.3 fL (ref 80.0–100.0)
Monocytes Absolute: 0.7 10*3/uL (ref 0.1–1.0)
Monocytes Relative: 10 %
Neutro Abs: 5.6 10*3/uL (ref 1.7–7.7)
Neutrophils Relative %: 75 %
Platelets: 137 10*3/uL — ABNORMAL LOW (ref 150–400)
RBC: 4.59 MIL/uL (ref 3.87–5.11)
RDW: 13.2 % (ref 11.5–15.5)
WBC: 7.4 10*3/uL (ref 4.0–10.5)
nRBC: 0 % (ref 0.0–0.2)

## 2018-04-08 LAB — BASIC METABOLIC PANEL
Anion gap: 11 (ref 5–15)
BUN: 9 mg/dL (ref 8–23)
CO2: 26 mmol/L (ref 22–32)
Calcium: 10.2 mg/dL (ref 8.9–10.3)
Chloride: 105 mmol/L (ref 98–111)
Creatinine, Ser: 0.96 mg/dL (ref 0.44–1.00)
GFR calc Af Amer: >60 mL/min (ref >60)
GFR calc non Af Amer: >60 mL/min (ref >60)
Glucose, Bld: 110 mg/dL — ABNORMAL HIGH (ref 70–99)
Potassium: 4.2 mmol/L (ref 3.5–5.1)
Sodium: 142 mmol/L (ref 135–145)

## 2018-04-08 MED ORDER — IBUPROFEN 800 MG PO TABS: 800.0000 mg | ORAL_TABLET | Freq: Three times a day (TID) | ORAL | 0 refills | Status: DC | PRN

## 2018-04-08 MED ORDER — MUPIROCIN CALCIUM 2 % EX CREA: 1.0000 "application " | TOPICAL_CREAM | Freq: Two times a day (BID) | CUTANEOUS | 0 refills | Status: DC

## 2018-04-08 MED ORDER — SODIUM CHLORIDE 0.9 % IV BOLUS
1000.0000 mL | Freq: Once | INTRAVENOUS | Status: AC
  Administered 2018-04-08: 1000 mL via INTRAVENOUS

## 2018-04-08 MED ORDER — PROMETHAZINE HCL 25 MG PO TABS: 25.0000 mg | ORAL_TABLET | Freq: Four times a day (QID) | ORAL | 0 refills | Status: DC | PRN

## 2018-04-08 MED ORDER — ONDANSETRON HCL 4 MG/2ML IJ SOLN
4.0000 mg | Freq: Once | INTRAMUSCULAR | Status: AC
  Administered 2018-04-08: 4 mg via INTRAVENOUS
  Filled 2018-04-08: qty 2

## 2018-04-08 NOTE — Discharge Instructions (Signed)
Return here as needed.  Follow-up with your primary doctor. °

## 2018-04-08 NOTE — ED Triage Notes (Signed)
PT reports spider bite to Lt side of neck last Sunday. Pt reports nausea and vomiting  For 2 days.

## 2018-04-08 NOTE — ED Notes (Signed)
Patient verbalizes understanding of discharge instructions. Opportunity for questioning and answers were provided. Armband removed by staff, pt discharged from ED.  

## 2018-04-13 NOTE — ED Provider Notes (Signed)
[place] EMERGENCY DEPARTMENT Provider Note   CSN: [id number] Arrival date & time: 04/08/18  1635     History   Chief Complaint Chief Complaint  Patient presents with  . Insect Bite    HPI Amanda Hoover is a 63 y.o. female.  HPI Patient presents to the emergency department with a area on the left side of her neck that appears a little swollen but started 3 days ago.  The patient states that the area is fairly small but she noticed it started getting a dark discoloration to it.  Patient states she was concerned something bit her.  She was concerned that she may have been bitten by stink bug patient states she found one in her bed.  The patient denies chest pain, shortness of breath, headache,blurred vision, neck pain, fever, cough, weakness, numbness, dizziness, anorexia, edema, abdominal pain, nausea, vomiting, diarrhea, rash, back pain, dysuria, hematemesis, bloody stool, near syncope, or syncope. Past Medical History:  Diagnosis Date  . Allergy   . Anxiety   . 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  . Breast cancer, right ([place])    ER 0 PR0 LN neg.  . Cancer ([place])   . Chronic headaches   . Chronic ITP (idiopathic thrombocytopenia) (HCC)    mild thrombocytopenia, normal spleen on [place] 11/18, mild cirrhosis  . Dental crowns present   . Depression    no current meds.  Marland Kitchen GERD (gastroesophageal reflux disease)   . Headache(784.0)    stress, sinus headaches  . Heart murmur   . Hiatal hernia   . Hiatal hernia   . History of colon polyps   . History of esophageal stricture    has had esophageal dilation x 1  . History of migraine   . History of radiation therapy 03/28/2012-05/17/2012   60.4 gray to right breast  . Hyperlipidemia   . Multiple environmental allergies   . Osteopenia   . Osteopenia   . Palpitations    occasional; worse with ingestion of caffeine; no cardiologist  . Personal history of radiation  therapy 2013   Right Breast Cancer  . Rash 01/06/2012   right elbow  . Rhinitis, allergic    rhinitis  . Thyroid disease     Patient Active Problem List   Diagnosis Date Noted  . Chronic ITP (idiopathic thrombocytopenia) (HCC)   . Osteopenia 10/10/2014  . BMI 39.0-39.9,adult 01/01/2014  . Elevated liver enzymes 07/26/2013  . Hot flashes due to tamoxifen 07/26/2013  . Thrombocytopenia ([place]) 01/29/2013  . Chronic headaches   . 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 Chest pain 07/06/2010  . Palpitations 07/06/2010  . GERD 10/02/2008  . OTHER DYSPHAGIA 10/02/2008    Past Surgical History:  Procedure Laterality Date  . BREAST LUMPECTOMY Right 2013  . COLONOSCOPY    . COLONOSCOPY W/ POLYPECTOMY    . ESOPHAGOGASTRODUODENOSCOPY    . PARTIAL MASTECTOMY WITH NEEDLE LOCALIZATION AND AXILLARY SENTINEL LYMPH NODE BX  01/12/2012   Procedure: PARTIAL MASTECTOMY WITH NEEDLE LOCALIZATION AND AXILLARY SENTINEL LYMPH NODE BX;  Surgeon: Adin Hector, MD;  Location: [place];  Service: General;  Laterality: Right;  . RE-EXCISION OF BREAST CANCER,SUPERIOR MARGINS  02/15/2012   Procedure: RE-EXCISION OF BREAST CANCER,SUPERIOR MARGINS;  Surgeon: Adin Hector, MD;  Location: [place];  Service: General;  Laterality: Right;  Re-excision of right lumpectomy, close posterior margin.  OB History   No obstetric history on file.      Home Medications    Prior to Admission medications   Medication Sig Start Date End Date Taking? Authorizing Provider  alendronate (FOSAMAX) 70 MG tablet Take 1 tablet (70 mg total) by mouth every 7 (seven) days. Take with a full glass of water on an empty stomach. 04/01/18   Susy Frizzle, MD  ALPRAZolam Duanne Moron) 0.25 MG tablet TAKE 1 TABLET BY MOUTH TWICE A DAY AS NEEDED FOR ANXIETY 02/07/17   Orlena Sheldon, PA-C  buPROPion (WELLBUTRIN XL) 300 MG 24 hr tablet  11/04/17   [provider]  cyclobenzaprine (FLEXERIL) 10 MG tablet Take 1 tablet (10 mg total) by mouth 3 (three) times daily as needed for muscle spasms. 03/02/18   Susy Frizzle, MD  EPINEPHrine 0.3 mg/0.3 mL IJ SOAJ injection  01/04/18   [provider]  ibuprofen (ADVIL,MOTRIN) 800 MG tablet Take 1 tablet (800 mg total) by mouth every 8 (eight) hours as needed. 04/08/18   Shaft Corigliano, Harrell Gave, PA-C  mupirocin cream (BACTROBAN) 2 % Apply 1 application topically 2 (two) times daily. 04/08/18   Rashida Ladouceur, Harrell Gave, PA-C  pantoprazole (PROTONIX) 40 MG tablet TAKE 1 TABLET BY MOUTH  TWICE A DAY 10/17/17   Susy Frizzle, MD  phentermine (ADIPEX-P) 37.5 MG tablet Take 37.5 mg by mouth daily before breakfast.    [provider]  promethazine (PHENERGAN) 25 MG tablet Take 1 tablet (25 mg total) by mouth every 6 (six) hours as needed for nausea or vomiting. 04/08/18   Dalia Heading, PA-C    Family History Family History  Problem Relation Age of Onset  . Crohn's disease Mother   . Irritable bowel syndrome Mother   . Esophageal cancer Father 68  . Breast cancer Paternal Aunt        dx in her early 33s  . Diabetes Maternal Grandmother   . Breast cancer Paternal Grandmother 79  . Diabetes Paternal Grandmother   . Cancer Maternal Uncle        unknown form of cancer  . Melanoma Cousin        maternal cousin dx in her 21s  . Breast cancer Other   . Liver disease Other   . Colon cancer Neg Hx   . Rectal cancer Neg Hx   . Stomach cancer Neg Hx     Social History Social History   Tobacco Use  . Smoking status: Former Smoker    Packs/day: 0.50    Years: 5.00    Pack years: 2.50    Types: Cigarettes    Last attempt to quit: 03/15/2000    Years since quitting: 18.0  . Smokeless tobacco: Never Used  Substance Use Topics  . Alcohol use: No  . Drug use: No    Comment: quit smoking 11 years ago     Allergies   Macadamia nut oil; Other; Peanut-containing drug products;  Tomato; Adhesive [tape]; Flour; Penicillins; Hydrocodone; Oxycodone; Sulfa antibiotics; Vicodin [hydrocodone-acetaminophen]; Eggs or egg-derived products; Soap; and Tramadol   Review of Systems Review of Systems  All other systems negative except as documented in the HPI. All pertinent positives and negatives as reviewed in the HPI. Physical Exam Updated Vital Signs BP 130/65 (BP Location: Left Arm)   Pulse 80   Temp 97.9 F (36.6 C) (Oral)   Resp 17   Ht 5' 4.25" (1.632 m)   Wt 100.2 kg   SpO2 100%   BMI  37.64 kg/m   Physical Exam Vitals signs and nursing note reviewed.  Constitutional:      General: She is not in acute distress.    Appearance: She is well-developed.  HENT:     Head: Normocephalic and atraumatic.  Eyes:     Pupils: Pupils are equal, round, and reactive to light.  Neck:     Musculoskeletal: Normal range of motion and neck supple.   Cardiovascular:     Rate and Rhythm: Normal rate and regular rhythm.     Heart sounds: Normal heart sounds. No murmur. No friction rub. No gallop.   Pulmonary:     Effort: Pulmonary effort is normal. No respiratory distress.     Breath sounds: Normal breath sounds. No wheezing.  Skin:    General: Skin is warm and dry.     Capillary Refill: Capillary refill takes less than 2 seconds.     Findings: No erythema or rash.  Neurological:     Mental Status: She is alert and oriented to person, place, and time.     Motor: No abnormal muscle tone.     Coordination: Coordination normal.  Psychiatric:        Behavior: Behavior normal.      ED Treatments / Results  Labs (all labs ordered are listed, but only abnormal results are displayed) Labs Reviewed  BASIC METABOLIC PANEL - Abnormal; Notable for the following components:      Result Value   Glucose, Bld 110 (*)    All other components within normal limits  CBC WITH DIFFERENTIAL/PLATELET - Abnormal; Notable for the following components:   Platelets 137 (*)    All other  components within normal limits    EKG None  Radiology No results found.  Procedures Procedures (including critical care time)  Medications Ordered in ED Medications  sodium chloride 0.9 % bolus 1,000 mL (0 mLs Intravenous Stopped 04/08/18 2048)  ondansetron (ZOFRAN) injection 4 mg (4 mg Intravenous Given 04/08/18 1758)     Initial Impression / Assessment and Plan / ED Course  I have reviewed the triage vital signs and the nursing notes.  Pertinent labs & imaging results that were available during my care of the patient were reviewed by me and considered in my medical decision making (see chart for details).    Patient was given a prescription of doxycycline which she states she has not started yet.  I have given her some medications for control of her symptoms as well.  She states she did have some nausea vomiting over the last 24 hours.  I advised her I will feel that these areas are associated and she does not have any significant overwhelming infection at this time.  Patient has been stable here in the emergency department patient agrees the plan and all questions were answered.  Final Clinical Impressions(s) / ED Diagnoses   Final diagnoses:  Cellulitis of neck    ED Discharge Orders         Ordered    ibuprofen (ADVIL,MOTRIN) 800 MG tablet  Every 8 hours PRN     04/08/18 2017    mupirocin cream (BACTROBAN) 2 %  2 times daily     04/08/18 2018    promethazine (PHENERGAN) 25 MG tablet  Every 6 hours PRN     04/08/18 2048           Dalia Heading, PA-C 04/13/18 0023    Pattricia Boss, MD 04/13/18 [phone]

## 2018-04-25 NOTE — Therapy (Signed)
[place], [place], [address] Phone: [phone]   Fax:  [phone]  Physical Therapy Treatment/Discharge Summary  Patient Details  Name: Amanda Hoover MRN: [id number] Date of Birth: 1955/10/26 Referring Provider (PT): Jenna Luo, MD   Encounter Date: 03/23/2018    Past Medical History:  Diagnosis Date  . Allergy   . Anxiety   . 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  . Breast cancer, right ([place])    ER 0 PR0 LN neg.  . Cancer ([place])   . Chronic headaches   . Chronic ITP (idiopathic thrombocytopenia) (HCC)    mild thrombocytopenia, normal spleen on [place] 11/18, mild cirrhosis  . Dental crowns present   . Depression    no current meds.  Marland Kitchen GERD (gastroesophageal reflux disease)   . Headache(784.0)    stress, sinus headaches  . Heart murmur   . Hiatal hernia   . Hiatal hernia   . History of colon polyps   . History of esophageal stricture    has had esophageal dilation x 1  . History of migraine   . History of radiation therapy 03/28/2012-05/17/2012   60.4 gray to right breast  . Hyperlipidemia   . Multiple environmental allergies   . Osteopenia   . Osteopenia   . Palpitations    occasional; worse with ingestion of caffeine; no cardiologist  . Personal history of radiation therapy 2013   Right Breast Cancer  . Rash 01/06/2012   right elbow  . Rhinitis, allergic    rhinitis  . Thyroid disease     Past Surgical History:  Procedure Laterality Date  . BREAST LUMPECTOMY Right 2013  . COLONOSCOPY    . COLONOSCOPY W/ POLYPECTOMY    . ESOPHAGOGASTRODUODENOSCOPY    . PARTIAL MASTECTOMY WITH NEEDLE LOCALIZATION AND AXILLARY SENTINEL LYMPH NODE BX  01/12/2012   Procedure: PARTIAL MASTECTOMY WITH NEEDLE LOCALIZATION AND AXILLARY SENTINEL LYMPH NODE BX;  Surgeon: Adin Hector, MD;  Location: [facility];  Service: General;  Laterality:  Right;  . RE-EXCISION OF BREAST CANCER,SUPERIOR MARGINS  02/15/2012   Procedure: RE-EXCISION OF BREAST CANCER,SUPERIOR MARGINS;  Surgeon: Adin Hector, MD;  Location: [facility];  Service: General;  Laterality: Right;  Re-excision of right lumpectomy, close posterior margin.    There were no vitals filed for this visit.                                 PT Long Term Goals - 03/23/18 0845      PT LONG TERM GOAL #1   Title  Independent with HEP    Status  Achieved      PT LONG TERM GOAL #2   Title  Increase left hip abduction strength 1/2 MMT grade to improve pelvic stability to decrease pain with ambulation    Baseline  4+/5    Status  Achieved      PT LONG TERM GOAL #3   Title  Improve FOTO to 47% or less impairment    Status  On-going      PT LONG TERM GOAL #4   Title  Tolerate standing at work for at least 1 hour with left hip pain symptoms decreased 50% or greater from current status    Status  Achieved              Patient will  benefit from skilled therapeutic intervention in order to improve the following deficits and impairments:  Abnormal gait, Impaired flexibility, Obesity, Difficulty walking, Decreased activity tolerance, Decreased endurance, Decreased strength, Pain  Visit Diagnosis: Pain in left hip  Pain in left thigh  Muscle weakness (generalized)  Difficulty in walking, not elsewhere classified     Problem List Patient Active Problem List   Diagnosis Date Noted  . Chronic ITP (idiopathic thrombocytopenia) (HCC)   . Osteopenia 10/10/2014  . BMI 39.0-39.9,adult 01/01/2014  . Elevated liver enzymes 07/26/2013  . Hot flashes due to tamoxifen 07/26/2013  . Thrombocytopenia ([facility]) 01/29/2013  . Chronic headaches   . 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 Chest pain 07/06/2010  . Palpitations 07/06/2010  . GERD 10/02/2008  . OTHER DYSPHAGIA 10/02/2008       PHYSICAL THERAPY DISCHARGE SUMMARY  Visits from Start of Care: 7  Current functional level related to goals / functional outcomes: Current status unknown, patient did not return for further therapy after last session 03/23/18.   Remaining deficits: Status unknown   Education / Equipment: NA  Plan: Patient agrees to discharge.  Patient goals were partially met. Patient is being discharged due to not returning since the last visit.  ?????           Beaulah Dinning, PT, DPT 04/25/18 9:58 AM    [facility] [address] [place], [place], [address] Phone: [phone]   Fax:  [phone]  Name: Amanda Hoover MRN: [id number] Date of Birth: Feb 10, 1956

## 2018-08-14 DIAGNOSIS — H2513 Age-related nuclear cataract, bilateral: Secondary | ICD-10-CM | POA: Diagnosis not present

## 2018-08-14 DIAGNOSIS — H524 Presbyopia: Secondary | ICD-10-CM | POA: Diagnosis not present

## 2018-08-14 DIAGNOSIS — H25013 Cortical age-related cataract, bilateral: Secondary | ICD-10-CM | POA: Diagnosis not present

## 2018-08-14 DIAGNOSIS — H04123 Dry eye syndrome of bilateral lacrimal glands: Secondary | ICD-10-CM | POA: Diagnosis not present

## 2018-08-14 DIAGNOSIS — H35363 Drusen (degenerative) of macula, bilateral: Secondary | ICD-10-CM | POA: Diagnosis not present

## 2018-10-30 ENCOUNTER — Other Ambulatory Visit: Payer: Self-pay

## 2018-10-31 ENCOUNTER — Ambulatory Visit (INDEPENDENT_AMBULATORY_CARE_PROVIDER_SITE_OTHER): Payer: BC Managed Care – PPO | Admitting: Family Medicine

## 2018-10-31 ENCOUNTER — Encounter: Payer: Self-pay | Admitting: Family Medicine

## 2018-10-31 VITALS — BP 128/78 | HR 68 | Temp 98.4°F | Resp 14 | Ht 64.25 in | Wt 233.0 lb

## 2018-10-31 DIAGNOSIS — E038 Other specified hypothyroidism: Secondary | ICD-10-CM

## 2018-10-31 DIAGNOSIS — M7061 Trochanteric bursitis, right hip: Secondary | ICD-10-CM

## 2018-10-31 MED ORDER — PREDNISONE 20 MG PO TABS: ORAL_TABLET | ORAL | 0 refills | Status: DC

## 2018-10-31 NOTE — Progress Notes (Signed)
Subjective:    Patient ID: Amanda Hoover, female    DOB: 1955-04-27, 63 y.o.   MRN: [id number]  HPI Patient presents today with several weeks of pain in her right hip.  Pain is located over the greater trochanter.  It hurts for her to sleep on that hip.  Hurts for me to touch the lateral aspect of her right hip particular over the greater trochanter.  She denies any radiating pain.  She denies any posterior hip pain.  She denies any pain in her right hip with flexion or extension or internal or external rotation.  She does have pain when she lays on her right side.  She denies any numbness or weakness in her right leg.  She denies any lumbar radicular symptoms.  She has a negative straight leg raise.  She has no pain in her right knee.  Pain is made worse by standing and walking.  She would also like her lab work checked.  She stopped all of her medications 3 months ago.  She was taking thyroid medication under the management of her gynecologist.  However she became frustrated at the fluctuating doses and therefore stopped the medication altogether.  She is due to recheck that. Past Medical History:  Diagnosis Date  . Allergy   . Anxiety   . 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  . Breast cancer, right ([facility])    ER 0 PR0 LN neg.  . Cancer ([facility])   . Chronic headaches   . Chronic ITP (idiopathic thrombocytopenia) (HCC)    mild thrombocytopenia, normal spleen on [place] 11/18, mild cirrhosis  . Dental crowns present   . Depression    no current meds.  Marland Kitchen GERD (gastroesophageal reflux disease)   . Headache(784.0)    stress, sinus headaches  . Heart murmur   . Hiatal hernia   . Hiatal hernia   . History of colon polyps   . History of esophageal stricture    has had esophageal dilation x 1  . History of migraine   . History of radiation therapy 03/28/2012-05/17/2012   60.4 gray to right breast  . Hyperlipidemia   . Multiple environmental allergies    . Osteopenia   . Osteopenia   . Palpitations    occasional; worse with ingestion of caffeine; no cardiologist  . Personal history of radiation therapy 2013   Right Breast Cancer  . Rash 01/06/2012   right elbow  . Rhinitis, allergic    rhinitis  . Thyroid disease    Past Surgical History:  Procedure Laterality Date  . BREAST LUMPECTOMY Right 2013  . COLONOSCOPY    . COLONOSCOPY W/ POLYPECTOMY    . ESOPHAGOGASTRODUODENOSCOPY    . PARTIAL MASTECTOMY WITH NEEDLE LOCALIZATION AND AXILLARY SENTINEL LYMPH NODE BX  01/12/2012   Procedure: PARTIAL MASTECTOMY WITH NEEDLE LOCALIZATION AND AXILLARY SENTINEL LYMPH NODE BX;  Surgeon: Adin Hector, MD;  Location: [facility];  Service: General;  Laterality: Right;  . RE-EXCISION OF BREAST CANCER,SUPERIOR MARGINS  02/15/2012   Procedure: RE-EXCISION OF BREAST CANCER,SUPERIOR MARGINS;  Surgeon: Adin Hector, MD;  Location: [facility];  Service: General;  Laterality: Right;  Re-excision of right lumpectomy, close posterior margin.   Current Outpatient Medications on File Prior to Visit  Medication Sig Dispense Refill  . pantoprazole (PROTONIX) 40 MG tablet TAKE 1 TABLET BY MOUTH  TWICE A DAY 180 tablet 1   No current facility-administered medications on  file prior to visit.    Allergies  Allergen Reactions  . Macadamia Nut Oil Anaphylaxis  . Other Swelling    POPCORN:  SWELLING THROAT  . Peanut-Containing Drug Products Swelling and Other (See Comments)    THROAT TIGHTNESS; ALSO WALNUTS  . Tomato Swelling    SWELLING UVULA  . Adhesive [Tape] Other (See Comments)    BLISTERS  . Flour Other (See Comments)    SNEEZING  . Penicillins Hives  . Hydrocodone Hives  . Oxycodone   . Sulfa Antibiotics Nausea Only  . Vicodin [Hydrocodone-Acetaminophen] Swelling    Pt had flushing, then itching and swelling of lips  . Eggs Or Egg-Derived Products Other (See Comments)    POSITIVE ON ALLERGY TEST, BUT EATS EGGS  WITHOUT PROBLEM  . Soap Other (See Comments)    FRAGRANCES (SOAP/LOTION/PERFUME):  HEADACHE, RUNNY NOSE  . Tramadol Nausea And Vomiting   Social History   Socioeconomic History  . Marital status: Married    Spouse name: Not on file  . Number of children: 4  . Years of education: Not on file  . Highest education level: Not on file  Occupational History    Employer: [employer]    Comment: Teacher, English as a foreign language for [employer]  Social Needs  . Financial resource strain: Not on file  . Food insecurity    Worry: Not on file    Inability: Not on file  . Transportation needs    Medical: Not on file    Non-medical: Not on file  Tobacco Use  . Smoking status: Former Smoker    Packs/day: 0.50    Years: 5.00    Pack years: 2.50    Types: Cigarettes    Quit date: 03/15/2000    Years since quitting: 18.6  . Smokeless tobacco: Never Used  Substance and Sexual Activity  . Alcohol use: No  . Drug use: No    Comment: quit smoking 11 years ago  . Sexual activity: Yes    Birth control/protection: Post-menopausal  Lifestyle  . Physical activity    Days per week: Not on file    Minutes per session: Not on file  . Stress: Not on file  Relationships  . Social Herbalist on phone: Not on file    Gets together: Not on file    Attends religious service: Not on file    Active member of club or organization: Not on file    Attends meetings of clubs or organizations: Not on file    Relationship status: Not on file  . Intimate partner violence    Fear of current or ex partner: Not on file    Emotionally abused: Not on file    Physically abused: Not on file    Forced sexual activity: Not on file  Other Topics Concern  . Not on file  Social History Narrative  . Not on file      Review of Systems  All other systems reviewed and are negative.      Objective:   Physical Exam Vitals signs reviewed.  Constitutional:      Appearance: Normal appearance. She is obese.   Cardiovascular:     Rate and Rhythm: Normal rate and regular rhythm.     Heart sounds: Normal heart sounds.  Pulmonary:     Effort: Pulmonary effort is normal.     Breath sounds: Normal breath sounds. No wheezing, rhonchi or rales.  Musculoskeletal:     Right hip: She exhibits  tenderness and bony tenderness. She exhibits normal range of motion and normal strength.       Legs:  Neurological:     Mental Status: She is alert.           Assessment & Plan:  1. Other specified hypothyroidism I will check fasting lab work to monitor the management of her hypothyroidism and while obtaining this I will also screen and check her cholesterol. - CBC with Differential/Platelet - COMPLETE METABOLIC PANEL WITH GFR - Lipid panel - TSH  2. Greater trochanteric bursitis of right hip Recommend an orthopedic surgery consultation for a action for greater trochanteric bursitis.  Meanwhile begin prednisone taper pack for temporary relief of the pain. - Ambulatory referral to Orthopedic Surgery

## 2018-11-01 LAB — CBC WITH DIFFERENTIAL/PLATELET
Absolute Monocytes: 465 cells/uL (ref 200–950)
Basophils Absolute: 52 cells/uL (ref 0–200)
Basophils Relative: 1.1 %
Eosinophils Absolute: 663 cells/uL — ABNORMAL HIGH (ref 15–500)
Eosinophils Relative: 14.1 %
HCT: 40.4 % (ref 35.0–45.0)
Hemoglobin: 13.7 g/dL (ref 11.7–15.5)
Lymphs Abs: 1208 cells/uL (ref 850–3900)
MCH: 32.6 pg (ref 27.0–33.0)
MCHC: 33.9 g/dL (ref 32.0–36.0)
MCV: 96.2 fL (ref 80.0–100.0)
MPV: 11.5 fL (ref 7.5–12.5)
Monocytes Relative: 9.9 %
Neutro Abs: 2312 cells/uL (ref 1500–7800)
Neutrophils Relative %: 49.2 %
Platelets: 125 10*3/uL — ABNORMAL LOW (ref 140–400)
RBC: 4.2 10*6/uL (ref 3.80–5.10)
RDW: 12.9 % (ref 11.0–15.0)
Total Lymphocyte: 25.7 %
WBC: 4.7 10*3/uL (ref 3.8–10.8)

## 2018-11-01 LAB — COMPLETE METABOLIC PANEL WITH GFR
AG Ratio: 1.5 (calc) (ref 1.0–2.5)
ALT: 61 U/L — ABNORMAL HIGH (ref 6–29)
AST: 66 U/L — ABNORMAL HIGH (ref 10–35)
Albumin: 4.1 g/dL (ref 3.6–5.1)
Alkaline phosphatase (APISO): 95 U/L (ref 37–153)
BUN: 15 mg/dL (ref 7–25)
CO2: 23 mmol/L (ref 20–32)
Calcium: 9.6 mg/dL (ref 8.6–10.4)
Chloride: 105 mmol/L (ref 98–110)
Creat: 0.96 mg/dL (ref 0.50–0.99)
GFR, Est African American: 72 mL/min/{1.73_m2} (ref >=60)
GFR, Est Non African American: 62 mL/min/{1.73_m2} (ref >=60)
Globulin: 2.7 g/dL (calc) (ref 1.9–3.7)
Glucose, Bld: 107 mg/dL — ABNORMAL HIGH (ref 65–99)
Potassium: 4.5 mmol/L (ref 3.5–5.3)
Sodium: 139 mmol/L (ref 135–146)
Total Bilirubin: 0.8 mg/dL (ref 0.2–1.2)
Total Protein: 6.8 g/dL (ref 6.1–8.1)

## 2018-11-01 LAB — LIPID PANEL
Cholesterol: 176 mg/dL (ref <200)
HDL: 43 mg/dL — ABNORMAL LOW (ref >=50)
LDL Cholesterol (Calc): 111 mg/dL (calc) — ABNORMAL HIGH
Non-HDL Cholesterol (Calc): 133 mg/dL (calc) — ABNORMAL HIGH (ref <130)
Total CHOL/HDL Ratio: 4.1 (calc) (ref <5.0)
Triglycerides: 113 mg/dL (ref <150)

## 2018-11-01 LAB — TSH: TSH: 17.54 mIU/L — ABNORMAL HIGH (ref 0.40–4.50)

## 2018-11-07 ENCOUNTER — Ambulatory Visit: Payer: BLUE CROSS/BLUE SHIELD | Admitting: Orthopaedic Surgery

## 2018-11-08 ENCOUNTER — Encounter: Payer: Self-pay | Admitting: Orthopaedic Surgery

## 2018-11-08 ENCOUNTER — Ambulatory Visit (INDEPENDENT_AMBULATORY_CARE_PROVIDER_SITE_OTHER): Payer: BC Managed Care – PPO | Admitting: Orthopaedic Surgery

## 2018-11-08 ENCOUNTER — Ambulatory Visit: Payer: Self-pay

## 2018-11-08 DIAGNOSIS — M7061 Trochanteric bursitis, right hip: Secondary | ICD-10-CM

## 2018-11-08 MED ORDER — BUPIVACAINE HCL 0.5 % IJ SOLN
3.0000 mL | INTRAMUSCULAR | Status: AC | PRN
  Administered 2018-11-08: 3 mL via INTRA_ARTICULAR

## 2018-11-08 MED ORDER — METHYLPREDNISOLONE ACETATE 40 MG/ML IJ SUSP
40.0000 mg | INTRAMUSCULAR | Status: AC | PRN
  Administered 2018-11-08: 40 mg via INTRA_ARTICULAR

## 2018-11-08 MED ORDER — LIDOCAINE HCL 1 % IJ SOLN
3.0000 mL | INTRAMUSCULAR | Status: AC | PRN
  Administered 2018-11-08: 3 mL

## 2018-11-08 NOTE — Progress Notes (Signed)
Office Visit Note   Patient: ZAKIYA ZWEIG           Date of Birth: [birth year]           MRN: [id number] Visit Date: 11/08/2018              Requested by: Susy Frizzle, MD [address] [facility] [place],   [address] PCP: Susy Frizzle, MD   Assessment & Plan: Visit Diagnoses:  1. Greater trochanteric bursitis of right hip     Plan: Impression is greater trochanter bursitis of the right hip.  Injection was repeated today.  She will take tomorrow off to rest.  Again she will make all efforts at weight loss.  She will resume exercises at home once she starts to feel a little bit better.  Questions encouraged and answered.  Follow-up as needed.  Follow-Up Instructions: Return if symptoms worsen or fail to improve.   Orders:  Orders Placed This Encounter  Procedures  . XR HIP UNILAT W OR W/O PELVIS 2-3 VIEWS RIGHT   No orders of the defined types were placed in this encounter.     Procedures: Large Joint Inj: R greater trochanter on 11/08/2018 4:37 PM Indications: pain Details: 22 G needle  Arthrogram: No  Medications: 3 mL lidocaine 1 %; 3 mL bupivacaine 0.5 %; 40 mg methylPREDNISolone acetate 40 MG/ML Patient was prepped and draped in the usual sterile fashion.       Clinical Data: No additional findings.   Subjective: Chief Complaint  Patient presents with  . Right Hip - Pain    Aretina comes in today for evaluation of right hip pain.  She endorses lateral right hip pain.  Denies any groin pain or radicular pain.  She recently finished a prednisone Dosepak which helped some.  She has had left trochanteric bursitis last year which did really well from an injection and outpatient physical therapy.   Review of Systems  Constitutional: Negative.   HENT: Negative.   Eyes: Negative.   Respiratory: Negative.   Cardiovascular: Negative.   Endocrine: Negative.   Musculoskeletal: Negative.   Neurological: Negative.   Hematological: Negative.    Psychiatric/Behavioral: Negative.   All other systems reviewed and are negative.    Objective: Vital Signs: There were no vitals taken for this visit.  Physical Exam Vitals signs and nursing note reviewed.  Constitutional:      Appearance: She is well-developed.  Pulmonary:     Effort: Pulmonary effort is normal.  Skin:    General: Skin is warm.     Capillary Refill: Capillary refill takes less than 2 seconds.  Neurological:     Mental Status: She is alert and oriented to person, place, and time.  Psychiatric:        Behavior: Behavior normal.        Thought Content: Thought content normal.        Judgment: Judgment normal.     Ortho Exam Right hip exam shows point tenderness over the lateral aspect of the hip.  There is good range of motion without groin pain.  Negative sciatic tension signs. Specialty Comments:  No specialty comments available.  Imaging: Xr Hip Unilat W Or W/o Pelvis 2-3 Views Right  Result Date: 11/08/2018 No acute or structural abnormalities    PMFS History: Patient Active Problem List   Diagnosis Date Noted  . Chronic ITP (idiopathic thrombocytopenia) (HCC)   . Osteopenia 10/10/2014  . BMI 39.0-39.9,adult 01/01/2014  .  Elevated liver enzymes 07/26/2013  . Hot flashes due to tamoxifen 07/26/2013  . Thrombocytopenia ([place]) 01/29/2013  . Chronic headaches   . 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 Chest pain 07/06/2010  . Palpitations 07/06/2010  . GERD 10/02/2008  . OTHER DYSPHAGIA 10/02/2008   Past Medical History:  Diagnosis Date  . Allergy   . Anxiety   . 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  . Breast cancer, right ([place])    ER 0 PR0 LN neg.  . Cancer ([place])   . Chronic headaches   . Chronic ITP (idiopathic thrombocytopenia) (HCC)    mild thrombocytopenia, normal spleen on [place] 11/18, mild cirrhosis  . Dental crowns present   . Depression    no  current meds.  Marland Kitchen GERD (gastroesophageal reflux disease)   . Headache(784.0)    stress, sinus headaches  . Heart murmur   . Hiatal hernia   . Hiatal hernia   . History of colon polyps   . History of esophageal stricture    has had esophageal dilation x 1  . History of migraine   . History of radiation therapy 03/28/2012-05/17/2012   60.4 gray to right breast  . Hyperlipidemia   . Multiple environmental allergies   . Osteopenia   . Osteopenia   . Palpitations    occasional; worse with ingestion of caffeine; no cardiologist  . Personal history of radiation therapy 2013   Right Breast Cancer  . Rash 01/06/2012   right elbow  . Rhinitis, allergic    rhinitis  . Thyroid disease     Family History  Problem Relation Age of Onset  . Crohn's disease Mother   . Irritable bowel syndrome Mother   . Esophageal cancer Father 9  . Breast cancer Paternal Aunt        dx in her early 101s  . Diabetes Maternal Grandmother   . Breast cancer Paternal Grandmother 69  . Diabetes Paternal Grandmother   . Cancer Maternal Uncle        unknown form of cancer  . Melanoma Cousin        maternal cousin dx in her 10s  . Breast cancer Other   . Liver disease Other   . Colon cancer Neg Hx   . Rectal cancer Neg Hx   . Stomach cancer Neg Hx     Past Surgical History:  Procedure Laterality Date  . BREAST LUMPECTOMY Right 2013  . COLONOSCOPY    . COLONOSCOPY W/ POLYPECTOMY    . ESOPHAGOGASTRODUODENOSCOPY    . PARTIAL MASTECTOMY WITH NEEDLE LOCALIZATION AND AXILLARY SENTINEL LYMPH NODE BX  01/12/2012   Procedure: PARTIAL MASTECTOMY WITH NEEDLE LOCALIZATION AND AXILLARY SENTINEL LYMPH NODE BX;  Surgeon: Adin Hector, MD;  Location: [facility];  Service: General;  Laterality: Right;  . RE-EXCISION OF BREAST CANCER,SUPERIOR MARGINS  02/15/2012   Procedure: RE-EXCISION OF BREAST CANCER,SUPERIOR MARGINS;  Surgeon: Adin Hector, MD;  Location: [facility];  Service:  General;  Laterality: Right;  Re-excision of right lumpectomy, close posterior margin.   Social History   Occupational History    Employer: [employer]    Comment: Teacher, English as a foreign language for [employer]  Tobacco Use  . Smoking status: Former Smoker    Packs/day: 0.50    Years: 5.00    Pack years: 2.50    Types: Cigarettes    Quit date: 03/15/2000    Years  since quitting: 18.6  . Smokeless tobacco: Never Used  Substance and Sexual Activity  . Alcohol use: No  . Drug use: No    Comment: quit smoking 11 years ago  . Sexual activity: Yes    Birth control/protection: Post-menopausal

## 2018-11-09 ENCOUNTER — Other Ambulatory Visit: Payer: Self-pay | Admitting: Family Medicine

## 2018-11-09 DIAGNOSIS — R7989 Other specified abnormal findings of blood chemistry: Secondary | ICD-10-CM

## 2018-11-09 MED ORDER — LEVOTHYROXINE SODIUM 25 MCG PO TABS: 25.0000 ug | ORAL_TABLET | Freq: Every day | ORAL | 3 refills | Status: DC

## 2018-11-09 MED ORDER — PANTOPRAZOLE SODIUM 40 MG PO TBEC: 40.0000 mg | DELAYED_RELEASE_TABLET | Freq: Two times a day (BID) | ORAL | 3 refills | Status: DC

## 2018-11-14 ENCOUNTER — Ambulatory Visit
Admission: RE | Admit: 2018-11-14 | Discharge: 2018-11-14 | Disposition: A | Discharge status: Home / Self Care | Payer: BC Managed Care – PPO | Source: Ambulatory Visit | Attending: Family Medicine | Admitting: Family Medicine

## 2018-11-14 ENCOUNTER — Telehealth: Payer: Self-pay | Admitting: Family Medicine

## 2018-11-14 DIAGNOSIS — R7989 Other specified abnormal findings of blood chemistry: Secondary | ICD-10-CM | POA: Diagnosis not present

## 2018-11-14 NOTE — Telephone Encounter (Addendum)
PA Submitted through [email or web address] and received the following:  [employer] is reviewing your PA request. Typically an electronic response will be received within 72 hours. To check for an update later, open this request from your dashboard.  You may close this dialog and return to your dashboard to perform other tasks. 

## 2018-11-16 ENCOUNTER — Other Ambulatory Visit: Payer: Self-pay | Admitting: Family Medicine

## 2018-11-16 DIAGNOSIS — Z1231 Encounter for screening mammogram for malignant neoplasm of breast: Secondary | ICD-10-CM

## 2018-11-22 MED ORDER — PANTOPRAZOLE SODIUM 40 MG PO TBEC: 40.0000 mg | DELAYED_RELEASE_TABLET | Freq: Two times a day (BID) | ORAL | 3 refills | Status: DC

## 2018-11-22 NOTE — Telephone Encounter (Signed)
Approvedon September 7 Request Reference Number: [id number]. PROTONIX TAB 40MG  is approved through 11/14/2019. For further questions, call [phone].  Pharm made aware

## 2018-11-29 ENCOUNTER — Other Ambulatory Visit: Payer: Self-pay | Admitting: Family Medicine

## 2018-11-29 DIAGNOSIS — K7469 Other cirrhosis of liver: Secondary | ICD-10-CM

## 2018-12-01 ENCOUNTER — Telehealth: Payer: Self-pay | Admitting: *Deleted

## 2018-12-01 NOTE — Telephone Encounter (Signed)
Received request from pharmacy for PA on Protonix.  PA submitted.   Dx: K21.9- GERD

## 2018-12-01 NOTE — Telephone Encounter (Signed)
[employer] is reviewing your PA request. Typically an electronic response will be received within 72 hours. To check for an update later, open this request from your dashboard.    You may close this dialog and return to your dashboard to perform other tasks.

## 2018-12-04 ENCOUNTER — Ambulatory Visit: Payer: BC Managed Care – PPO | Admitting: Family Medicine

## 2018-12-04 NOTE — Telephone Encounter (Signed)
This medication or product was previously approved on [id number].From-11/15/2018-To-11/15/2019

## 2018-12-21 ENCOUNTER — Ambulatory Visit (INDEPENDENT_AMBULATORY_CARE_PROVIDER_SITE_OTHER): Payer: BC Managed Care – PPO

## 2018-12-21 ENCOUNTER — Other Ambulatory Visit: Payer: Self-pay

## 2018-12-21 DIAGNOSIS — Z23 Encounter for immunization: Secondary | ICD-10-CM

## 2018-12-27 ENCOUNTER — Ambulatory Visit
Admission: RE | Admit: 2018-12-27 | Discharge: 2018-12-27 | Disposition: A | Discharge status: Home / Self Care | Payer: BC Managed Care – PPO | Source: Ambulatory Visit | Attending: Family Medicine | Admitting: Family Medicine

## 2018-12-27 ENCOUNTER — Other Ambulatory Visit: Payer: Self-pay

## 2018-12-27 DIAGNOSIS — Z1231 Encounter for screening mammogram for malignant neoplasm of breast: Secondary | ICD-10-CM | POA: Diagnosis not present

## 2018-12-27 MED ORDER — PANTOPRAZOLE SODIUM 40 MG PO TBEC: DELAYED_RELEASE_TABLET | ORAL | 3 refills | Status: DC

## 2019-01-01 ENCOUNTER — Other Ambulatory Visit: Payer: Self-pay

## 2019-01-01 ENCOUNTER — Encounter: Payer: Self-pay | Admitting: Gastroenterology

## 2019-01-01 ENCOUNTER — Other Ambulatory Visit (INDEPENDENT_AMBULATORY_CARE_PROVIDER_SITE_OTHER): Payer: BC Managed Care – PPO

## 2019-01-01 ENCOUNTER — Ambulatory Visit (INDEPENDENT_AMBULATORY_CARE_PROVIDER_SITE_OTHER): Payer: BC Managed Care – PPO | Admitting: Gastroenterology

## 2019-01-01 VITALS — BP 122/74 | HR 90 | Temp 97.4°F | Ht 64.0 in | Wt 237.4 lb

## 2019-01-01 DIAGNOSIS — D696 Thrombocytopenia, unspecified: Secondary | ICD-10-CM

## 2019-01-01 DIAGNOSIS — K7460 Unspecified cirrhosis of liver: Secondary | ICD-10-CM

## 2019-01-01 DIAGNOSIS — R7401 Elevation of levels of liver transaminase levels: Secondary | ICD-10-CM

## 2019-01-01 LAB — PROTIME-INR
INR: 1.1 ratio — ABNORMAL HIGH (ref 0.8–1.0)
Prothrombin Time: 13.2 s — ABNORMAL HIGH (ref 9.6–13.1)

## 2019-01-01 LAB — IBC PANEL
Iron: 112 ug/dL (ref 42–145)
Saturation Ratios: 28.6 % (ref 20.0–50.0)
Transferrin: 280 mg/dL (ref 212.0–360.0)

## 2019-01-01 LAB — FERRITIN: Ferritin: 88 ng/mL (ref 10.0–291.0)

## 2019-01-01 LAB — URIC ACID: Uric Acid, Serum: 5.6 mg/dL (ref 2.4–7.0)

## 2019-01-01 LAB — IRON: Iron: 112 ug/dL (ref 42–145)

## 2019-01-01 NOTE — Progress Notes (Addendum)
History of Present Illness: This is a 64 year old female referred by Jenna Luo, MD for the evaluation of elevated transaminases and an abnormal US of the liver suspicious for cirrhosis.  In addition she has thrombocytopenia.  She notes no prior history of hepatitis, liver disease or jaundice.  She states her mother had cirrhosis but she does not know the cause.  No history of alcohol use.  Mildly elevated transaminases were noted and abdominal imaging performed as below.  RUQ [place] 11/14/2018 IMPRESSION: Findings consistent with hepatic cirrhosis. No focal sonographic abnormality is noted. No other abnormality seen in the right upper quadrant of the abdomen.    Allergies  Allergen Reactions  . Macadamia Nut Oil Anaphylaxis  . Other Swelling    POPCORN:  SWELLING THROAT  . Peanut-Containing Drug Products Swelling and Other (See Comments)    THROAT TIGHTNESS; ALSO WALNUTS  . Tomato Swelling    SWELLING UVULA  . Adhesive [Tape] Other (See Comments)    BLISTERS  . Flour Other (See Comments)    SNEEZING  . Penicillins Hives  . Hydrocodone Hives  . Oxycodone   . Sulfa Antibiotics Nausea Only  . Vicodin [Hydrocodone-Acetaminophen] Swelling    Pt had flushing, then itching and swelling of lips  . Eggs Or Egg-Derived Products Other (See Comments)    POSITIVE ON ALLERGY TEST, BUT EATS EGGS WITHOUT PROBLEM  . Soap Other (See Comments)    FRAGRANCES (SOAP/LOTION/PERFUME):  HEADACHE, RUNNY NOSE  . Tramadol Nausea And Vomiting   Outpatient Medications Prior to Visit  Medication Sig Dispense Refill  . Methylcobalamin (B12-ACTIVE PO) Take 1 tablet by mouth.    Marland Kitchen MILK THISTLE PO Take by mouth.    . Multiple Vitamin (MULTIVITAMIN) tablet Take 1 tablet by mouth daily.    . pantoprazole (PROTONIX) 40 MG tablet Take 1 (40 mg) by mouth daily. 90 tablet 3  . levothyroxine (SYNTHROID) 25 MCG tablet Take 1 tablet (25 mcg total) by mouth daily before breakfast. 90 tablet 3  . predniSONE  (DELTASONE) 20 MG tablet 3 tabs poqday 1-2, 2 tabs poqday 3-4, 1 tab poqday 5-6 12 tablet 0   No facility-administered medications prior to visit.    Past Medical History:  Diagnosis Date  . Allergy   . Anxiety   . 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  . Breast cancer, right ([place])    ER 0 PR0 LN neg.  . Cancer ([place])   . Chronic headaches   . Chronic ITP (idiopathic thrombocytopenia) ([facility])    mild thrombocytopenia, normal spleen on [place] 11/18, mild cirrhosis  . Dental crowns present   . Depression    no current meds.  Marland Kitchen GERD (gastroesophageal reflux disease)   . Headache(784.0)    stress, sinus headaches  . Heart murmur   . Hiatal hernia   . Hiatal hernia   . History of colon polyps   . History of esophageal stricture    has had esophageal dilation x 1  . History of migraine   . History of radiation therapy 03/28/2012-05/17/2012   60.4 gray to right breast  . Hyperlipidemia   . Multiple environmental allergies   . Osteopenia   . Osteopenia   . Palpitations    occasional; worse with ingestion of caffeine; no cardiologist  . Personal history of radiation therapy 2013   Right Breast Cancer  . Rash 01/06/2012   right elbow  . Rhinitis, allergic    rhinitis  .  Thyroid disease    Past Surgical History:  Procedure Laterality Date  . BREAST LUMPECTOMY Right 2013  . COLONOSCOPY    . COLONOSCOPY W/ POLYPECTOMY    . ESOPHAGOGASTRODUODENOSCOPY    . PARTIAL MASTECTOMY WITH NEEDLE LOCALIZATION AND AXILLARY SENTINEL LYMPH NODE BX  01/12/2012   Procedure: PARTIAL MASTECTOMY WITH NEEDLE LOCALIZATION AND AXILLARY SENTINEL LYMPH NODE BX;  Surgeon: Adin Hector, MD;  Location: [facility];  Service: General;  Laterality: Right;  . RE-EXCISION OF BREAST CANCER,SUPERIOR MARGINS  02/15/2012   Procedure: RE-EXCISION OF BREAST CANCER,SUPERIOR MARGINS;  Surgeon: Adin Hector, MD;  Location: [facility];  Service:  General;  Laterality: Right;  Re-excision of right lumpectomy, close posterior margin.   Social History   Socioeconomic History  . Marital status: Married    Spouse name: Not on file  . Number of children: 4  . Years of education: Not on file  . Highest education level: Not on file  Occupational History    Employer: [employer]    Comment: Teacher, English as a foreign language for [employer]  Social Needs  . Financial resource strain: Not on file  . Food insecurity    Worry: Not on file    Inability: Not on file  . Transportation needs    Medical: Not on file    Non-medical: Not on file  Tobacco Use  . Smoking status: Former Smoker    Packs/day: 0.50    Years: 5.00    Pack years: 2.50    Types: Cigarettes    Quit date: 03/15/2000    Years since quitting: 18.8  . Smokeless tobacco: Never Used  Substance and Sexual Activity  . Alcohol use: No  . Drug use: No    Comment: quit smoking 11 years ago  . Sexual activity: Yes    Birth control/protection: Post-menopausal  Lifestyle  . Physical activity    Days per week: Not on file    Minutes per session: Not on file  . Stress: Not on file  Relationships  . Social Herbalist on phone: Not on file    Gets together: Not on file    Attends religious service: Not on file    Active member of club or organization: Not on file    Attends meetings of clubs or organizations: Not on file    Relationship status: Not on file  Other Topics Concern  . Not on file  Social History Narrative  . Not on file   Family History  Problem Relation Age of Onset  . Crohn's disease Mother   . Irritable bowel syndrome Mother   . Esophageal cancer Father 67  . Breast cancer Paternal Aunt        dx in her early 5s  . Diabetes Maternal Grandmother   . Breast cancer Paternal Grandmother 31  . Diabetes Paternal Grandmother   . Cancer Maternal Uncle        unknown form of cancer  . Melanoma Cousin        maternal cousin dx in her 70s  . Breast cancer  Other   . Liver disease Other   . Colon cancer Neg Hx   . Rectal cancer Neg Hx   . Stomach cancer Neg Hx     .  Physical Exam: General: Well developed, well nourished, no acute distress Head: Normocephalic and atraumatic Eyes:  sclerae anicteric, EOMI Ears: Normal auditory acuity Mouth: No deformity or lesions Lungs: Clear throughout to  auscultation Heart: Regular rate and rhythm; no murmurs, rubs or bruits Abdomen: Soft, non tender and non distended. No masses, hepatosplenomegaly or hernias noted. Normal Bowel sounds Rectal: Not done Musculoskeletal: Symmetrical with no gross deformities  Pulses:  Normal pulses noted Extremities: No clubbing, cyanosis, edema or deformities noted Neurological: Alert oriented x 4, grossly nonfocal Psychological:  Alert and cooperative. Normal mood and affect   Assessment and Recommendations:  1. Suspected cirrhosis, elevated LFT, thrombocytopenia. Suspected NASH.  Rule out other causes of liver disease.  Send standard hepatic serologies and PT/INR.  We discussed the possibility of liver biopsy for further evaluation pending the serologic studies.  After results return vaccinate for Hep A, B as indicated. Long-term weight loss program with a carb modified and fat modified diet was recommended supervised by her PCP.  Avoid alcohol use as she is doing.  No more than 2 g of acetaminophen per day.  Referral for weight loss program. REV in 6 weeks.   2.  Obesity, BMI 40.75.  Weight loss referral as above.  3.  GERD with a history of esophageal stricture.  Follow antireflux measures and continue pantoprazole 40 mg daily.

## 2019-01-01 NOTE — Patient Instructions (Signed)
Your provider has requested that you go to the basement level for lab work before leaving today. Press "B" on the elevator. The lab is located at the first door on the left as you exit the elevator.  You will receive a call from the weight management clinic to schedule an appointment.

## 2019-01-02 ENCOUNTER — Other Ambulatory Visit: Payer: Self-pay

## 2019-01-02 DIAGNOSIS — K7460 Unspecified cirrhosis of liver: Secondary | ICD-10-CM

## 2019-01-04 LAB — ANTI-SMOOTH MUSCLE ANTIBODY, IGG: Actin (Smooth Muscle) Antibody (IGG): <20 U (ref <20)

## 2019-01-04 LAB — HEPATITIS B SURFACE ANTIGEN: Hepatitis B Surface Ag: NONREACTIVE

## 2019-01-04 LAB — MITOCHONDRIAL ANTIBODIES: Mitochondrial M2 Ab, IgG: <=20 U

## 2019-01-04 LAB — HEPATITIS C ANTIBODY
Hepatitis C Ab: NONREACTIVE
SIGNAL TO CUT-OFF: 0.01 (ref <1.00)

## 2019-01-04 LAB — ALPHA-1-ANTITRYPSIN: A-1 Antitrypsin, Ser: 136 mg/dL (ref 83–199)

## 2019-01-04 LAB — CERULOPLASMIN: Ceruloplasmin: 33 mg/dL (ref 18–53)

## 2019-01-04 LAB — HEPATITIS B SURFACE ANTIBODY,QUALITATIVE: Hep B S Ab: NONREACTIVE

## 2019-01-04 LAB — ANA: Anti Nuclear Antibody (ANA): NEGATIVE

## 2019-01-08 ENCOUNTER — Ambulatory Visit (INDEPENDENT_AMBULATORY_CARE_PROVIDER_SITE_OTHER): Payer: BC Managed Care – PPO | Admitting: Gastroenterology

## 2019-01-08 DIAGNOSIS — K7460 Unspecified cirrhosis of liver: Secondary | ICD-10-CM

## 2019-01-08 DIAGNOSIS — Z23 Encounter for immunization: Secondary | ICD-10-CM

## 2019-01-08 NOTE — Progress Notes (Signed)
Pt is due on 11-30 but she will be out of town.  Would like a follow up appt with Dr. Fuller Plan at that time as well.  She will call back once she knows her travel plans to schedule an OV and will need her 2nd Twinrix at that time.

## 2019-01-23 DIAGNOSIS — F321 Major depressive disorder, single episode, moderate: Secondary | ICD-10-CM | POA: Diagnosis not present

## 2019-01-23 DIAGNOSIS — Z01419 Encounter for gynecological examination (general) (routine) without abnormal findings: Secondary | ICD-10-CM | POA: Diagnosis not present

## 2019-01-23 DIAGNOSIS — Z6839 Body mass index (BMI) 39.0-39.9, adult: Secondary | ICD-10-CM | POA: Diagnosis not present

## 2019-01-23 DIAGNOSIS — Z1151 Encounter for screening for human papillomavirus (HPV): Secondary | ICD-10-CM | POA: Diagnosis not present

## 2019-01-23 DIAGNOSIS — M810 Age-related osteoporosis without current pathological fracture: Secondary | ICD-10-CM | POA: Diagnosis not present

## 2019-01-24 ENCOUNTER — Telehealth: Payer: Self-pay

## 2019-01-24 NOTE — Telephone Encounter (Signed)
-----   Message from Amanda Hoover, [place] sent at 01/08/2019  9:48 AM EDT ----- Regarding: 2nd twinrix needed at 6 week F/U with [facility] Pt is due for 2nd twinrix on 11-30 (appt scheduled), however she thinks she will be out of town on that day.  She will also be due for 6 weeks F/U with Fuller Plan. She Michela Pitcher she would call back to reschedule the Twinrix and will change to appt with Fuller Plan after Thanksgiving.  Make sure appt is noted with "needs 2nd Twinrix"

## 2019-01-24 NOTE — Telephone Encounter (Signed)
Called and spoke to pt who has not scheduled an appt with Dr. Fuller Plan. She thinks she will still be out of town for Thanksgiving on Monday, 11-30 when we have her scheduled for her 2nd Twinrix so we rescheduled her 2nd Twinrix for Monday 12-7 at 9:15. (she was due on 11-26 so I encouraged her to keep as close to that date as possible).  She expressed understanding.  Pt did not want to make a F/U appt with [facility] at this time because of the Covid number raising.

## 2019-01-25 ENCOUNTER — Other Ambulatory Visit: Payer: Self-pay | Admitting: Obstetrics

## 2019-01-25 DIAGNOSIS — M810 Age-related osteoporosis without current pathological fracture: Secondary | ICD-10-CM

## 2019-02-16 ENCOUNTER — Other Ambulatory Visit: Payer: Self-pay

## 2019-02-16 ENCOUNTER — Ambulatory Visit (INDEPENDENT_AMBULATORY_CARE_PROVIDER_SITE_OTHER): Payer: Medicare Other | Admitting: Gastroenterology

## 2019-02-16 DIAGNOSIS — K7460 Unspecified cirrhosis of liver: Secondary | ICD-10-CM | POA: Diagnosis not present

## 2019-02-16 DIAGNOSIS — Z23 Encounter for immunization: Secondary | ICD-10-CM | POA: Diagnosis not present

## 2019-03-02 ENCOUNTER — Emergency Department (HOSPITAL_COMMUNITY)
Admission: EM | Admit: 2019-03-02 | Discharge: 2019-03-02 | Disposition: A | Discharge status: Home / Self Care | Payer: BC Managed Care – PPO | Attending: Emergency Medicine | Admitting: Emergency Medicine

## 2019-03-02 ENCOUNTER — Other Ambulatory Visit: Payer: Self-pay

## 2019-03-02 ENCOUNTER — Encounter (HOSPITAL_COMMUNITY): Payer: Self-pay | Admitting: Emergency Medicine

## 2019-03-02 ENCOUNTER — Emergency Department (HOSPITAL_COMMUNITY): Payer: BC Managed Care – PPO

## 2019-03-02 DIAGNOSIS — R0789 Other chest pain: Secondary | ICD-10-CM | POA: Insufficient documentation

## 2019-03-02 DIAGNOSIS — Z87891 Personal history of nicotine dependence: Secondary | ICD-10-CM | POA: Insufficient documentation

## 2019-03-02 DIAGNOSIS — S6992XA Unspecified injury of left wrist, hand and finger(s), initial encounter: Secondary | ICD-10-CM | POA: Diagnosis not present

## 2019-03-02 DIAGNOSIS — Z79899 Other long term (current) drug therapy: Secondary | ICD-10-CM | POA: Insufficient documentation

## 2019-03-02 DIAGNOSIS — Z91010 Allergy to peanuts: Secondary | ICD-10-CM | POA: Diagnosis not present

## 2019-03-02 DIAGNOSIS — R0781 Pleurodynia: Secondary | ICD-10-CM | POA: Diagnosis not present

## 2019-03-02 DIAGNOSIS — Y9289 Other specified places as the place of occurrence of the external cause: Secondary | ICD-10-CM | POA: Insufficient documentation

## 2019-03-02 DIAGNOSIS — Y9389 Activity, other specified: Secondary | ICD-10-CM | POA: Insufficient documentation

## 2019-03-02 DIAGNOSIS — Y999 Unspecified external cause status: Secondary | ICD-10-CM | POA: Diagnosis not present

## 2019-03-02 DIAGNOSIS — S60222A Contusion of left hand, initial encounter: Secondary | ICD-10-CM | POA: Diagnosis not present

## 2019-03-02 DIAGNOSIS — J9809 Other diseases of bronchus, not elsewhere classified: Secondary | ICD-10-CM | POA: Diagnosis not present

## 2019-03-02 MED ORDER — IBUPROFEN 200 MG PO TABS
600.0000 mg | ORAL_TABLET | Freq: Once | ORAL | Status: AC
  Administered 2019-03-02: 600 mg via ORAL
  Filled 2019-03-02: qty 3

## 2019-03-02 MED ORDER — ACETAMINOPHEN 325 MG PO TABS
650.0000 mg | ORAL_TABLET | Freq: Once | ORAL | Status: AC
  Administered 2019-03-02: 650 mg via ORAL
  Filled 2019-03-02: qty 2
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
## 2019-03-02 NOTE — ED Notes (Signed)
Husband of patient called, Jeleah Napoleon and would like an update, [phone].

## 2019-03-02 NOTE — Discharge Instructions (Addendum)
Your x-rays look fine. You will realistically be sore for about a week. Take 600 mg of ibuprofen every 6 hours as needed for pain. Continue to ice your hand through out the evening. Activity as tolerated.

## 2019-03-02 NOTE — ED Notes (Signed)
Patient transported to X-ray 

## 2019-03-05 ENCOUNTER — Ambulatory Visit (INDEPENDENT_AMBULATORY_CARE_PROVIDER_SITE_OTHER): Payer: BC Managed Care – PPO | Admitting: Family Medicine

## 2019-03-05 ENCOUNTER — Encounter: Payer: Self-pay | Admitting: Family Medicine

## 2019-03-05 ENCOUNTER — Other Ambulatory Visit: Payer: Self-pay

## 2019-03-05 VITALS — BP 144/76 | HR 86 | Temp 97.4°F | Resp 16 | Ht 64.25 in | Wt 235.0 lb

## 2019-03-05 DIAGNOSIS — R0781 Pleurodynia: Secondary | ICD-10-CM

## 2019-03-05 DIAGNOSIS — S60222A Contusion of left hand, initial encounter: Secondary | ICD-10-CM | POA: Diagnosis not present

## 2019-03-05 MED ORDER — TRAMADOL HCL 50 MG PO TABS: 50.0000 mg | ORAL_TABLET | Freq: Three times a day (TID) | ORAL | 0 refills | Status: AC | PRN
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
## 2019-03-07 ENCOUNTER — Ambulatory Visit (INDEPENDENT_AMBULATORY_CARE_PROVIDER_SITE_OTHER): Payer: BC Managed Care – PPO | Admitting: Gastroenterology

## 2019-03-07 ENCOUNTER — Encounter: Payer: Self-pay | Admitting: Gastroenterology

## 2019-03-07 VITALS — BP 126/76 | HR 84 | Temp 98.1°F | Ht 64.25 in | Wt 236.2 lb

## 2019-03-07 DIAGNOSIS — K219 Gastro-esophageal reflux disease without esophagitis: Secondary | ICD-10-CM

## 2019-03-07 DIAGNOSIS — Z1159 Encounter for screening for other viral diseases: Secondary | ICD-10-CM | POA: Diagnosis not present

## 2019-03-07 DIAGNOSIS — R7401 Elevation of levels of liver transaminase levels: Secondary | ICD-10-CM | POA: Diagnosis not present

## 2019-03-07 NOTE — Patient Instructions (Signed)
You have been scheduled for an endoscopy. Please follow written instructions given to you at your visit today. If you use inhalers (even only as needed), please bring them with you on the day of your procedure.  We will contact you in March to have repeat labs and repeat ultrasound.   Due to recent changes in healthcare laws, you may see the results of your imaging and laboratory studies on MyChart before your provider has had a chance to review them.  We understand that in some cases there may be results that are confusing or concerning to you. Not all laboratory results come back in the same time frame and the provider may be waiting for multiple results in order to interpret others.  Please give [place] 48 hours in order for your provider to thoroughly review all the results before contacting the office for clarification of your results.   Thank you for choosing me and [facility].  Pricilla Riffle. Dagoberto Ligas., MD., [facility]
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
## 2019-03-12 ENCOUNTER — Telehealth: Payer: Self-pay | Admitting: Family Medicine

## 2019-03-12 NOTE — Telephone Encounter (Signed)
Patient dropped off fmla forms to be completed  [phone]

## 2019-03-14 NOTE — Addendum Note (Signed)
Addended by: Marzella Schlein on: 03/14/2019 09:03 AM   Modules accepted: Orders

## 2019-03-19 NOTE — Telephone Encounter (Signed)
Patient contacted office and would like a status update on her FMLA. She would like to know if they haven't been sent in can we put the date of 03/22/2019 for her to return to work. Please call her. She states that she isn't on the schedule anymore at [employer].   CB# [phone]

## 2019-03-21 NOTE — Telephone Encounter (Signed)
FMLA was completed on 03/20/2019. Pt had called several times last week for updates and no one ever called her back that is why she is upset. (message was routed to me however I was not here the week of 03/12/19.) I apologized to pt and informed her that they were completed on 03/20/2019 and that we get 7-10 business days to fill out those type of forms and I was going to fax them today however I had a note to call about dates prior to faxing.   Pt would like for her return to work date to be 03/26/19 so she can draw her STD for this week as she can not go back to work until the company has paperwork in system for 3 days. So if you can write her out until 1/11 that would cover her for this week and she is still pretty sore.

## 2019-03-21 NOTE — Telephone Encounter (Signed)
Patient contacted office again today asking for an update on her FMLA and disability paperwork. I tried to call Lovey Newcomer to get an update for patient however she wasn't at her desk. She is expecting a call back today from [employer]. She states that they needed to be sent back within 15 days and she wants to discuss before they are sent in as far as the dates on the form.  Please call her at [phone] it is very important that she talks with you today!

## 2019-03-23 ENCOUNTER — Other Ambulatory Visit: Payer: Self-pay | Admitting: Family Medicine

## 2019-03-23 ENCOUNTER — Ambulatory Visit (INDEPENDENT_AMBULATORY_CARE_PROVIDER_SITE_OTHER): Payer: Medicare Other | Admitting: Family Medicine

## 2019-03-23 ENCOUNTER — Other Ambulatory Visit: Payer: Self-pay

## 2019-03-23 ENCOUNTER — Encounter: Payer: Self-pay | Admitting: Family Medicine

## 2019-03-23 VITALS — BP 126/74 | HR 100 | Temp 96.6°F | Resp 14 | Ht 64.25 in | Wt 236.0 lb

## 2019-03-23 DIAGNOSIS — H9201 Otalgia, right ear: Secondary | ICD-10-CM | POA: Diagnosis not present

## 2019-03-23 MED ORDER — TIZANIDINE HCL 4 MG PO TABS: 4.0000 mg | ORAL_TABLET | Freq: Every evening | ORAL | 0 refills | Status: DC | PRN

## 2019-03-23 MED ORDER — DICLOFENAC SODIUM 75 MG PO TBEC: 75.0000 mg | DELAYED_RELEASE_TABLET | Freq: Two times a day (BID) | ORAL | 0 refills | Status: DC

## 2019-03-23 MED ORDER — AZITHROMYCIN 250 MG PO TABS: ORAL_TABLET | ORAL | 0 refills | Status: DC

## 2019-03-23 NOTE — Progress Notes (Signed)
Subjective:    Patient ID: Amanda Hoover, female    DOB: 11/17/1955, 64 y.o.   MRN: [id number]  HPI Patient reports a 2-day history of severe pain in her right ear primarily at night.  She describes the pain is spreading down her right jaw.  It is an aching throbbing pain.  She also reports a "fluttering" sound in her ear.  She denies any rhinorrhea.  She denies any head congestion.  She denies any sore throat.  She denies any cough.  She denies any sinus headache.  She denies any fever or chills.  On exam today, the tympanic membrane is pearly gray.  There is no middle ear effusion.  It is not bulging or retracted.  There is no abnormality seen within the auditory canal to explain the pain.  There is no erythema in the posterior oropharynx.  Exam is essentially normal Past Medical History:  Diagnosis Date  . Allergy   . Anxiety   . 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  . Breast cancer, right ([facility])    ER 0 PR0 LN neg.  . Cancer ([facility])   . Chronic headaches   . Chronic ITP (idiopathic thrombocytopenia) (HCC)    mild thrombocytopenia, normal spleen on [place] 11/18, mild cirrhosis  . Dental crowns present   . Depression    no current meds.  Marland Kitchen GERD (gastroesophageal reflux disease)   . Headache(784.0)    stress, sinus headaches  . Heart murmur   . Hiatal hernia   . Hiatal hernia   . History of colon polyps   . History of esophageal stricture    has had esophageal dilation x 1  . History of migraine   . History of radiation therapy 03/28/2012-05/17/2012   60.4 gray to right breast  . Hyperlipidemia   . Multiple environmental allergies   . Osteopenia   . Osteopenia   . Palpitations    occasional; worse with ingestion of caffeine; no cardiologist  . Personal history of radiation therapy 2013   Right Breast Cancer  . Rash 01/06/2012   right elbow  . Rhinitis, allergic    rhinitis  . Thyroid disease    Past Surgical History:  Procedure  Laterality Date  . BREAST LUMPECTOMY Right 2013  . COLONOSCOPY    . COLONOSCOPY W/ POLYPECTOMY    . ESOPHAGOGASTRODUODENOSCOPY    . PARTIAL MASTECTOMY WITH NEEDLE LOCALIZATION AND AXILLARY SENTINEL LYMPH NODE BX  01/12/2012   Procedure: PARTIAL MASTECTOMY WITH NEEDLE LOCALIZATION AND AXILLARY SENTINEL LYMPH NODE BX;  Surgeon: Adin Hector, MD;  Location: [facility];  Service: General;  Laterality: Right;  . RE-EXCISION OF BREAST CANCER,SUPERIOR MARGINS  02/15/2012   Procedure: RE-EXCISION OF BREAST CANCER,SUPERIOR MARGINS;  Surgeon: Adin Hector, MD;  Location: [facility];  Service: General;  Laterality: Right;  Re-excision of right lumpectomy, close posterior margin.   Current Outpatient Medications on File Prior to Visit  Medication Sig Dispense Refill  . levothyroxine (SYNTHROID) 25 MCG tablet Take 25 mcg by mouth daily.    . Methylcobalamin (B12-ACTIVE PO) Take 1 tablet by mouth.    Marland Kitchen MILK THISTLE PO Take by mouth.    . Multiple Vitamin (MULTIVITAMIN) tablet Take 1 tablet by mouth daily.    . pantoprazole (PROTONIX) 40 MG tablet Take 1 (40 mg) by mouth daily. 90 tablet 3   No current facility-administered medications on file prior to visit.   Allergies  Allergen  Reactions  . Macadamia Nut Oil Anaphylaxis  . Other Swelling    POPCORN:  SWELLING THROAT  . Peanut-Containing Drug Products Swelling and Other (See Comments)    THROAT TIGHTNESS; ALSO WALNUTS  . Tomato Swelling    SWELLING UVULA  . Adhesive [Tape] Other (See Comments)    BLISTERS  . Flour Other (See Comments)    SNEEZING  . Penicillins Hives  . Hydrocodone Hives  . Oxycodone   . Sulfa Antibiotics Nausea Only  . Vicodin [Hydrocodone-Acetaminophen] Swelling    Pt had flushing, then itching and swelling of lips  . Eggs Or Egg-Derived Products Other (See Comments)    POSITIVE ON ALLERGY TEST, BUT EATS EGGS WITHOUT PROBLEM  . Soap Other (See Comments)    FRAGRANCES  (SOAP/LOTION/PERFUME):  HEADACHE, RUNNY NOSE  . Tramadol Nausea And Vomiting   Social History   Socioeconomic History  . Marital status: Married    Spouse name: Not on file  . Number of children: 4  . Years of education: Not on file  . Highest education level: Not on file  Occupational History    Employer: [employer]    Comment: Teacher, English as a foreign language for [employer]  Tobacco Use  . Smoking status: Former Smoker    Packs/day: 0.50    Years: 5.00    Pack years: 2.50    Types: Cigarettes    Quit date: 03/15/2000    Years since quitting: 19.0  . Smokeless tobacco: Never Used  Substance and Sexual Activity  . Alcohol use: No  . Drug use: No    Comment: quit smoking 11 years ago  . Sexual activity: Yes    Birth control/protection: Post-menopausal  Other Topics Concern  . Not on file  Social History Narrative  . Not on file   Social Determinants of Health   Financial Resource Strain:   . Difficulty of Paying Living Expenses: Not on file  Food Insecurity:   . Worried About Charity fundraiser in the Last Year: Not on file  . Ran Out of Food in the Last Year: Not on file  Transportation Needs:   . Lack of Transportation (Medical): Not on file  . Lack of Transportation (Non-Medical): Not on file  Physical Activity:   . Days of Exercise per Week: Not on file  . Minutes of Exercise per Session: Not on file  Stress:   . Feeling of Stress : Not on file  Social Connections:   . Frequency of Communication with Friends and Family: Not on file  . Frequency of Social Gatherings with Friends and Family: Not on file  . Attends Religious Services: Not on file  . Active Member of Clubs or Organizations: Not on file  . Attends Archivist Meetings: Not on file  . Marital Status: Not on file  Intimate Partner Violence:   . Fear of Current or Ex-Partner: Not on file  . Emotionally Abused: Not on file  . Physically Abused: Not on file  . Sexually Abused: Not on file      Review  of Systems  All other systems reviewed and are negative.      Objective:   Physical Exam Vitals reviewed.  Constitutional:      General: She is not in acute distress.    Appearance: She is well-developed. She is not diaphoretic.  HENT:     Right Ear: Tympanic membrane, ear canal and external ear normal.     Left Ear: Tympanic membrane, ear canal and  external ear normal.     Nose: Mucosal edema present. No congestion or rhinorrhea.     Right Sinus: No maxillary sinus tenderness or frontal sinus tenderness.     Left Sinus: No maxillary sinus tenderness or frontal sinus tenderness.     Mouth/Throat:     Pharynx: No oropharyngeal exudate.  Eyes:     Conjunctiva/sclera: Conjunctivae normal.  Cardiovascular:     Rate and Rhythm: Normal rate and regular rhythm.     Heart sounds: Normal heart sounds. No murmur. No friction rub. No gallop.   Pulmonary:     Effort: Pulmonary effort is normal. No respiratory distress.     Breath sounds: Normal breath sounds. No stridor. No wheezing or rales.  Lymphadenopathy:     Cervical: No cervical adenopathy.           Assessment & Plan:  Acute otalgia, right  Exam shows no explanation for her ear pain.  I see no signs of an ear infection.  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  I recommended trying Zanaflex 4 mg p.o. nightly as a muscle relaxer to help prevent bruxism.  She can also take Voltaren 75 mg twice daily as an anti-inflammatory.  Should she develop fevers or severe ear pain associated with hearing loss, at that point she can take a Z-Pak for possible otitis media however I see no indication of that on exam today.  Reassess next week if no better or sooner if worsening

## 2019-03-30 ENCOUNTER — Encounter: Payer: BC Managed Care – PPO | Admitting: Gastroenterology

## 2019-04-05 ENCOUNTER — Telehealth: Payer: Self-pay | Admitting: Family Medicine

## 2019-04-05 NOTE — Telephone Encounter (Signed)
Pt dropped off paperwork on or about 04/02/2019 stating that she needed [place] to call [employer] to give them additional information to get her disability through the week of 03/26/19. On 04/04/19 I called and spoke to [facility] and they needed an addendum to the last ov note stating why we kept her out of work an additional week and fax to them. Addendum done by Dr. Dennard Schaumann and paperwork was faxed to [phone] with confirmation.

## 2019-04-09 ENCOUNTER — Ambulatory Visit (INDEPENDENT_AMBULATORY_CARE_PROVIDER_SITE_OTHER): Payer: Medicare Other | Admitting: Family Medicine

## 2019-04-09 ENCOUNTER — Other Ambulatory Visit: Payer: Self-pay

## 2019-04-09 ENCOUNTER — Encounter: Payer: Self-pay | Admitting: Family Medicine

## 2019-04-09 DIAGNOSIS — N63 Unspecified lump in unspecified breast: Secondary | ICD-10-CM
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
## 2019-04-09 NOTE — Patient Instructions (Signed)
Follow up in one to two weeks if breast lump has not decreased or resolved or worsens.

## 2019-04-18 ENCOUNTER — Other Ambulatory Visit: Payer: Self-pay | Admitting: Family Medicine

## 2019-04-18 NOTE — Telephone Encounter (Signed)
Ok to refill 

## 2019-04-23 ENCOUNTER — Ambulatory Visit (INDEPENDENT_AMBULATORY_CARE_PROVIDER_SITE_OTHER): Payer: Medicare Other | Admitting: Family Medicine

## 2019-04-23 ENCOUNTER — Encounter (INDEPENDENT_AMBULATORY_CARE_PROVIDER_SITE_OTHER): Payer: Self-pay | Admitting: Family Medicine

## 2019-04-23 ENCOUNTER — Other Ambulatory Visit: Payer: Self-pay

## 2019-04-23 VITALS — BP 105/80 | HR 65 | Temp 98.0°F | Ht 65.0 in | Wt 233.0 lb

## 2019-04-23 DIAGNOSIS — F418 Other specified anxiety disorders: Secondary | ICD-10-CM | POA: Diagnosis not present

## 2019-04-23 DIAGNOSIS — E038 Other specified hypothyroidism: Secondary | ICD-10-CM

## 2019-04-23 DIAGNOSIS — E7849 Other hyperlipidemia: Secondary | ICD-10-CM | POA: Diagnosis not present

## 2019-04-23 DIAGNOSIS — R0602 Shortness of breath: Secondary | ICD-10-CM | POA: Diagnosis not present

## 2019-04-23 DIAGNOSIS — E538 Deficiency of other specified B group vitamins: Secondary | ICD-10-CM | POA: Diagnosis not present

## 2019-04-23 DIAGNOSIS — Z9189 Other specified personal risk factors, not elsewhere classified: Secondary | ICD-10-CM

## 2019-04-23 DIAGNOSIS — R5383 Other fatigue: Secondary | ICD-10-CM

## 2019-04-23 DIAGNOSIS — Z0289 Encounter for other administrative examinations: Secondary | ICD-10-CM

## 2019-04-23 DIAGNOSIS — Z6838 Body mass index (BMI) 38.0-38.9, adult: Secondary | ICD-10-CM

## 2019-04-23 NOTE — Progress Notes (Signed)
Dear Dr. Lucio Hoover,   Thank you for referring Amanda Hoover to our clinic. The following note includes my evaluation and treatment recommendations.  Chief Complaint:   Amanda Hoover (MR# [id number]) is a 64 y.o. female who presents for evaluation and treatment of obesity and related comorbidities. Current BMI is Body mass index is 38.77 kg/m.Marland Kitchen Amanda Hoover has been struggling with her weight for many years and has been unsuccessful in either losing weight, maintaining weight loss, or reaching her healthy weight goal.  Amanda Hoover is currently in the action stage of change and ready to dedicate time achieving and maintaining a healthier weight. Amanda Hoover is interested in becoming our patient and working on intensive lifestyle modifications including (but not limited to) diet and exercise for weight loss.  Amanda Hoover has a diagnosis of liver cirrhosis. For breakfast she eats 1 pack of oatmeal or 2 fried eggs with butter and grits and is full. For a snack she will have Werther's original through the a.m. For lunch she will eat a sandwich with 3 slices of [place], 1 slice of cheese, and mayo (feels full) with a pudding snack. For dinner she will have a microwave meal or eat out. After dinner she will have ice cream.  Amanda Hoover's habits were reviewed today and are as follows: Her family eats meals together, she thinks her family will eat healthier with her, her desired weight loss is 73 lbs, she started gaining weight 10 years ago, her heaviest weight ever was 236 pounds, she eats ice cream in the evening, she skips lunch 3 times weekly, she is frequently drinking liquids with calories, she frequently makes poor food choices and she struggles with emotional eating.  Depression Screen Amanda Food and Mood (modified PHQ-9) score was 14.  Depression screen PHQ 2/9 04/23/2019  Decreased Interest 1  Down, Depressed, Hopeless 2  PHQ - 2 Score 3  Altered sleeping 2  Tired, decreased energy 3  Change in  appetite 3  Feeling bad or failure about yourself  1  Trouble concentrating 1  Moving slowly or fidgety/restless 1  Suicidal thoughts 0  PHQ-9 Score 14  Difficult doing work/chores Somewhat difficult  Some recent data might be hidden   Subjective:   Other fatigue. Amanda Hoover denies daytime somnolence and admits to waking up still tired. Patent has a history of symptoms of morning headache. Amanda Hoover generally gets 8 hours of sleep per night, and states that she does not sleep well most nights. Snoring is present. Apneic episodes are not present. Epworth Sleepiness Score is 2.  Shortness of breath on exertion. Amanda Hoover notes increasing shortness of breath with exercising and seems to be worsening over time with weight gain. She notes getting out of breath sooner with activity than she used to. This has gotten worse recently. Amanda Hoover denies shortness of breath at rest or orthopnea. EKG showed NSR.   Other specified hypothyroidism. Amanda Hoover just recently started on 25 mcg after not taking Synthroid for a while.   Lab Results  Component Value Date   TSH 17.54 (H) 10/31/2018   B12 nutritional deficiency. Amanda Hoover reports having fatigue. She is on 1000 mg of B12 daily.  No results found for: VITAMINB12    Other hyperlipidemia. Amanda Hoover is not on a statin.  Lab Results  Component Value Date   CHOL 176 10/31/2018   HDL 43 (L) 10/31/2018   LDLCALC 111 (H) 10/31/2018   TRIG 113 10/31/2018   CHOLHDL 4.1 10/31/2018   Lab Results  Component  Value Date   ALT 61 (H) 10/31/2018   AST 66 (H) 10/31/2018   ALKPHOS 90 12/28/2016   BILITOT 0.8 10/31/2018   The 10-year ASCVD risk score Mikey Bussing DC Jr., et al., 2013) is: 4%   Values used to calculate the score:     Age: 69 years     Sex: Female     Is Non-Hispanic African American: No     Diabetic: No     Tobacco smoker: No     Systolic Blood Pressure: 123456 mmHg     Is BP treated: No     HDL Cholesterol: 43 mg/dL     Total Cholesterol: 176  mg/dL  Depression with anxiety. Amanda Hoover is struggling with emotional eating and using food for comfort to the extent that it is negatively impacting her health. She has been working on behavior modification techniques to help reduce her emotional eating and has been somewhat successful. She shows no sign of suicidal or homicidal ideations. Amanda Hoover was previously on Wellbutrin and Buspar.  At risk for heart disease. Amanda Hoover is at a higher than average risk for cardiovascular disease due to obesity. Reviewed: no chest pain on exertion, no dyspnea on exertion, and no swelling of ankles.  Assessment/Plan:   Other fatigue. Amanda Hoover does feel that her weight is causing her energy to be lower than it should be. Fatigue may be related to obesity, depression or many other causes. Labs will be ordered, and in the meanwhile, Amanda Hoover will focus on self care including making healthy food choices, increasing physical activity and focusing on stress reduction. EKG 12-Lead, Hemoglobin A1c, Insulin, random, Folate, VITAMIN D 25 Hydroxy (Vit-D Deficiency, Fractures) ordered today.  Shortness of breath on exertion. Amanda Hoover does feel that she gets out of breath more easily that she used to when she exercises. Amanda Hoover's shortness of breath appears to be obesity related and exercise induced. She has agreed to work on weight loss and gradually increase exercise to treat her exercise induced shortness of breath. Will continue to monitor closely.   Other specified hypothyroidism. Patient with long-standing hypothyroidism, on levothyroxine therapy. She appears euthyroid. Orders and follow up as documented in patient record. T3, T4, free, TSH levels ordered today.  Counseling . Good thyroid control is important for overall health. Supratherapeutic thyroid levels are dangerous and will not improve weight loss results. . The correct way to take levothyroxine is fasting, with water, separated by at least 30 minutes from breakfast, and  separated by more than 4 hours from calcium, iron, multivitamins, acid reflux medications (PPIs).    B12 nutritional deficiency. The diagnosis was reviewed with the patient. Counseling provided today, see below. We will continue to monitor. Orders and follow up as documented in patient record. Vitamin B12 level ordered today.  Counseling . The body needs vitamin B12: to make red blood cells; to make DNA; and to help the nerves work properly so they can carry messages from the brain to the body.  . The main causes of vitamin B12 deficiency include dietary deficiency, digestive diseases, pernicious anemia, and having a surgery in which part of the stomach or small intestine is removed.  . Certain medicines can make it harder for the body to absorb vitamin B12. These medicines include: heartburn medications; some antibiotics; some medications used to treat diabetes, gout, and high cholesterol.  . In some cases, there are no symptoms of this condition. If the condition leads to anemia or nerve damage, various symptoms can occur, such as weakness  or fatigue, shortness of breath, and numbness or tingling in your hands and feet.   . Treatment:  o May include taking vitamin B12 supplements.  o Avoid alcohol.  o Eat lots of healthy foods that contain vitamin B12: - Beef, pork, chicken, [place], and organ meats, such as liver.  - Seafood: This includes clams, rainbow trout, salmon, tuna, and haddock.  - Eggs.  - Cereal and dairy products that are fortified: This means that vitamin B12 has been added to the food.    Other hyperlipidemia. Cardiovascular risk and specific lipid/LDL goals reviewed.  We discussed several lifestyle modifications today and Loeva will continue to work on diet, exercise and weight loss efforts. Orders and follow up as documented in patient record. Lipid Panel With LDL/HDL Ratio ordered today.  Counseling Intensive lifestyle modifications are the first line treatment for this  issue. . Dietary changes: Increase soluble fiber. Decrease simple carbohydrates. . Exercise changes: Moderate to vigorous-intensity aerobic activity 150 minutes per week if tolerated. . Lipid-lowering medications: see documented in medical record.    Depression with anxiety. Behavior modification techniques were discussed today to help Amanda Hoover deal with her emotional/non-hunger eating behaviors.  Orders and follow up as documented in patient record. Will follow-up at her next appointment.  At risk for heart disease. Amanda Hoover was given approximately 15 minutes of coronary artery disease prevention counseling today. She is 64 y.o. female and has risk factors for heart disease including obesity. We discussed intensive lifestyle modifications today with an emphasis on specific weight loss instructions and strategies.   Repetitive spaced learning was employed today to elicit superior memory formation and behavioral change.  Class 2 severe obesity with serious comorbidity and body mass index (BMI) of 38.0 to 38.9 in adult, unspecified obesity type ([place]).  Amanda Hoover is currently in the action stage of change and her goal is to continue with weight loss efforts. I recommend Amanda Hoover begin the structured treatment plan as follows:  She has agreed to the Category 2 Plan.  Exercise goals: Older adults should follow the adult guidelines. When older adults cannot meet the adult guidelines, they should be as physically active as their abilities and conditions will allow.    Behavioral modification strategies: increasing lean protein intake, increasing vegetables, meal planning and cooking strategies, keeping healthy foods in the home and planning for success.  She was informed of the importance of frequent follow-up visits to maximize her success with intensive lifestyle modifications for her multiple health conditions. She was informed we would discuss her lab results at her next visit unless there is a critical  issue that needs to be addressed sooner. Amanda Hoover agreed to keep her next visit at the agreed upon time to discuss these results.  Objective:   Blood pressure 105/80, pulse 65, temperature 98 F (36.7 C), temperature source Oral, height 5\' 5"  (1.651 m), weight 233 lb (105.7 kg), SpO2 98 %. Body mass index is 38.77 kg/m.  EKG: Sinus  Rhythm with a rate of 67 BPM. Low voltage in precordial leads. Abnormal.  Indirect Calorimeter completed today shows a VO2 of 224 and a REE of 1560.  Her calculated basal metabolic rate is [id number] thus her basal metabolic rate is worse than expected.  General: Cooperative, alert, well developed, in no acute distress. HEENT: Conjunctivae and lids unremarkable. Cardiovascular: Regular rhythm.  Lungs: Normal work of breathing. Neurologic: No focal deficits.   Lab Results  Component Value Date   CREATININE 0.96 10/31/2018   BUN 15 10/31/2018  NA 139 10/31/2018   K 4.5 10/31/2018   CL 105 10/31/2018   CO2 23 10/31/2018   Lab Results  Component Value Date   ALT 61 (H) 10/31/2018   AST 66 (H) 10/31/2018   ALKPHOS 90 12/28/2016   BILITOT 0.8 10/31/2018   Lab Results  Component Value Date   HGBA1C 4.8 06/17/2016   No results found for: INSULIN Lab Results  Component Value Date   TSH 17.54 (H) 10/31/2018   Lab Results  Component Value Date   CHOL 176 10/31/2018   HDL 43 (L) 10/31/2018   LDLCALC 111 (H) 10/31/2018   TRIG 113 10/31/2018   CHOLHDL 4.1 10/31/2018   Lab Results  Component Value Date   WBC 4.7 10/31/2018   HGB 13.7 10/31/2018   HCT 40.4 10/31/2018   MCV 96.2 10/31/2018   PLT 125 (L) 10/31/2018   Lab Results  Component Value Date   IRON 112 01/01/2019   IRON 112 01/01/2019   FERRITIN 88.0 01/01/2019   Attestation Statements:   Reviewed by clinician on day of visit: allergies, medications, problem list, medical history, surgical history, family history, social history, and previous encounter notes.  I, Michaelene Song, am acting  as transcriptionist for Ilene Qua, MD  I have reviewed the above documentation for accuracy and completeness, and I agree with the above. - Ilene Qua, MD

## 2019-04-24 LAB — CBC WITH DIFFERENTIAL/PLATELET
Basophils Absolute: 0.1 10*3/uL (ref 0.0–0.2)
Basos: 1 %
EOS (ABSOLUTE): 0.1 10*3/uL (ref 0.0–0.4)
Eos: 3 %
Hematocrit: 45.3 % (ref 34.0–46.6)
Hemoglobin: 15 g/dL (ref 11.1–15.9)
Immature Grans (Abs): 0 10*3/uL (ref 0.0–0.1)
Immature Granulocytes: 0 %
Lymphocytes Absolute: 1.2 10*3/uL (ref 0.7–3.1)
Lymphs: 25 %
MCH: 32 pg (ref 26.6–33.0)
MCHC: 33.1 g/dL (ref 31.5–35.7)
MCV: 97 fL (ref 79–97)
Monocytes Absolute: 0.4 10*3/uL (ref 0.1–0.9)
Monocytes: 9 %
Neutrophils Absolute: 3.1 10*3/uL (ref 1.4–7.0)
Neutrophils: 62 %
Platelets: 155 10*3/uL (ref 150–450)
RBC: 4.69 x10E6/uL (ref 3.77–5.28)
RDW: 13.3 % (ref 11.7–15.4)
WBC: 5 10*3/uL (ref 3.4–10.8)

## 2019-04-24 LAB — COMPREHENSIVE METABOLIC PANEL
ALT: 50 IU/L — ABNORMAL HIGH (ref 0–32)
AST: 61 IU/L — ABNORMAL HIGH (ref 0–40)
Albumin/Globulin Ratio: 1.6 (ref 1.2–2.2)
Albumin: 4.7 g/dL (ref 3.8–4.8)
Alkaline Phosphatase: 132 IU/L — ABNORMAL HIGH (ref 39–117)
BUN/Creatinine Ratio: 13 (ref 12–28)
BUN: 13 mg/dL (ref 8–27)
Bilirubin Total: 0.9 mg/dL (ref 0.0–1.2)
CO2: 23 mmol/L (ref 20–29)
Calcium: 9.8 mg/dL (ref 8.7–10.3)
Chloride: 105 mmol/L (ref 96–106)
Creatinine, Ser: 0.99 mg/dL (ref 0.57–1.00)
GFR calc Af Amer: 69 mL/min/{1.73_m2} (ref >59)
GFR calc non Af Amer: 60 mL/min/{1.73_m2} (ref >59)
Globulin, Total: 2.9 g/dL (ref 1.5–4.5)
Glucose: 90 mg/dL (ref 65–99)
Potassium: 4.1 mmol/L (ref 3.5–5.2)
Sodium: 142 mmol/L (ref 134–144)
Total Protein: 7.6 g/dL (ref 6.0–8.5)

## 2019-04-24 LAB — LIPID PANEL WITH LDL/HDL RATIO
Cholesterol, Total: 174 mg/dL (ref 100–199)
HDL: 58 mg/dL (ref >39)
LDL Chol Calc (NIH): 102 mg/dL — ABNORMAL HIGH (ref 0–99)
LDL/HDL Ratio: 1.8 ratio (ref 0.0–3.2)
Triglycerides: 74 mg/dL (ref 0–149)
VLDL Cholesterol Cal: 14 mg/dL (ref 5–40)

## 2019-04-24 LAB — HEMOGLOBIN A1C
Est. average glucose Bld gHb Est-mCnc: 103 mg/dL
Hgb A1c MFr Bld: 5.2 % (ref 4.8–5.6)

## 2019-04-24 LAB — INSULIN, RANDOM: INSULIN: 19.3 u[IU]/mL (ref 2.6–24.9)

## 2019-04-24 LAB — FOLATE: Folate: 19.3 ng/mL (ref >3.0)

## 2019-04-24 LAB — T3: T3, Total: 147 ng/dL (ref 71–180)

## 2019-04-24 LAB — T4, FREE: Free T4: 1.01 ng/dL (ref 0.82–1.77)

## 2019-04-24 LAB — VITAMIN D 25 HYDROXY (VIT D DEFICIENCY, FRACTURES): Vit D, 25-Hydroxy: 34.8 ng/mL (ref 30.0–100.0)

## 2019-04-24 LAB — VITAMIN B12: Vitamin B-12: 1274 pg/mL — ABNORMAL HIGH (ref 232–1245)

## 2019-04-24 LAB — TSH: TSH: 9.76 u[IU]/mL — ABNORMAL HIGH (ref 0.450–4.500)

## 2019-05-02 ENCOUNTER — Other Ambulatory Visit: Payer: Self-pay | Admitting: Gastroenterology

## 2019-05-02 DIAGNOSIS — Z1159 Encounter for screening for other viral diseases: Secondary | ICD-10-CM | POA: Diagnosis not present

## 2019-05-02 LAB — SARS CORONAVIRUS 2 (TAT 6-24 HRS): SARS Coronavirus 2: NEGATIVE

## 2019-05-04 ENCOUNTER — Encounter: Payer: Self-pay | Admitting: Gastroenterology

## 2019-05-04 ENCOUNTER — Ambulatory Visit (AMBULATORY_SURGERY_CENTER): Payer: Medicare Other | Admitting: Gastroenterology

## 2019-05-04 ENCOUNTER — Other Ambulatory Visit: Payer: Self-pay

## 2019-05-04 VITALS — BP 136/78 | HR 66 | Temp 98.0°F | Resp 20 | Ht 64.25 in | Wt 236.0 lb

## 2019-05-04 DIAGNOSIS — K7460 Unspecified cirrhosis of liver: Secondary | ICD-10-CM | POA: Diagnosis not present

## 2019-05-04 DIAGNOSIS — K222 Esophageal obstruction: Secondary | ICD-10-CM | POA: Diagnosis not present

## 2019-05-04 DIAGNOSIS — K317 Polyp of stomach and duodenum: Secondary | ICD-10-CM | POA: Diagnosis not present

## 2019-05-04 DIAGNOSIS — K219 Gastro-esophageal reflux disease without esophagitis: Secondary | ICD-10-CM | POA: Diagnosis not present

## 2019-05-04 DIAGNOSIS — K229 Disease of esophagus, unspecified: Secondary | ICD-10-CM

## 2019-05-04 DIAGNOSIS — Z853 Personal history of malignant neoplasm of breast: Secondary | ICD-10-CM | POA: Diagnosis not present

## 2019-05-04 MED ORDER — SODIUM CHLORIDE 0.9 % IV SOLN: 500.0000 mL | Freq: Once | INTRAVENOUS | Status: DC

## 2019-05-04 NOTE — Patient Instructions (Signed)
YOU HAD AN ENDOSCOPIC PROCEDURE TODAY AT THE [facility]:   Refer to the procedure report that was given to you for any specific questions about what was found during the examination.  If the procedure report does not answer your questions, please call your gastroenterologist to clarify.  If you requested that your care partner not be given the details of your procedure findings, then the procedure report has been included in a sealed envelope for you to review at your convenience later.  YOU SHOULD EXPECT: Some feelings of bloating in the abdomen. Passage of more gas than usual.  Walking can help get rid of the air that was put into your GI tract during the procedure and reduce the bloating. If you had a lower endoscopy (such as a colonoscopy or flexible sigmoidoscopy) you may notice spotting of blood in your stool or on the toilet paper. If you underwent a bowel prep for your procedure, you may not have a normal bowel movement for a few days.  Please Note:  You might notice some irritation and congestion in your nose or some drainage.  This is from the oxygen used during your procedure.  There is no need for concern and it should clear up in a day or so.  SYMPTOMS TO REPORT IMMEDIATELY:   Following upper endoscopy (EGD)  Vomiting of blood or coffee ground material  New chest pain or pain under the shoulder blades  Painful or persistently difficult swallowing  New shortness of breath  Fever of 100F or higher  Black, tarry-looking stools  For urgent or emergent issues, a gastroenterologist can be reached at any hour by calling [phone].   DIET:  We do recommend a small meal at first, but then you may proceed to your regular diet.  Drink plenty of fluids but you should avoid alcoholic beverages for 24 hours.  ACTIVITY:  You should plan to take it easy for the rest of today and you should NOT DRIVE or use heavy machinery until tomorrow (because of the sedation medicines used  during the test).    FOLLOW UP: Our staff will call the number listed on your records 48-72 hours following your procedure to check on you and address any questions or concerns that you may have regarding the information given to you following your procedure. If we do not reach you, we will leave a message.  We will attempt to reach you two times.  During this call, we will ask if you have developed any symptoms of COVID 19. If you develop any symptoms (ie: fever, flu-like symptoms, shortness of breath, cough etc.) before then, please call [phone].  If you test positive for Covid 19 in the 2 weeks post procedure, please call and report this information to us.    If any biopsies were taken you will be contacted by phone or by letter within the next 1-3 weeks.  Please call us at [phone] if you have not heard about the biopsies in 3 weeks.    SIGNATURES/CONFIDENTIALITY: You and/or your care partner have signed paperwork which will be entered into your electronic medical record.  These signatures attest to the fact that that the information above on your After Visit Summary has been reviewed and is understood.  Full responsibility of the confidentiality of this discharge information lies with you and/or your care-partner. 

## 2019-05-04 NOTE — Op Note (Addendum)
[facility] Patient Name: Amanda Hoover Procedure Date: 05/04/2019 9:45 AM MRN: [id number] Endoscopist: Ladene Artist , MD Age: 64 Referring MD:  Date of Birth: [birth year] Gender: Female Account #: [id number] Procedure:                Upper GI endoscopy Indications:              Screening procedure. Cirrhosis. R/O varices. Medicines:                Monitored Anesthesia Care Procedure:                Pre-Anesthesia Assessment:                           - Prior to the procedure, a History and Physical                            was performed, and patient medications and                            allergies were reviewed. The patient's tolerance of                            previous anesthesia was also reviewed. The risks                            and benefits of the procedure and the sedation                            options and risks were discussed with the patient.                            All questions were answered, and informed consent                            was obtained. Prior Anticoagulants: The patient has                            taken no previous anticoagulant or antiplatelet                            agents. ASA Grade Assessment: III - A patient with                            severe systemic disease. After reviewing the risks                            and benefits, the patient was deemed in                            satisfactory condition to undergo the procedure.                           After obtaining informed consent, the endoscope was  passed under direct vision. Throughout the                            procedure, the patient's blood pressure, pulse, and                            oxygen saturations were monitored continuously. The                            Endoscope was introduced through the mouth, and                            advanced to the second part of duodenum. The upper                            GI  endoscopy was accomplished without difficulty.                            The patient tolerated the procedure well. Scope In: Scope Out: Findings:                 The Z-line was variable and was found 40 cm from                            the incisors. Previously biopsied so not repeated.                            No esophageal varices noted.                           The exam of the esophagus was otherwise normal.                           Multiple 4 to 8 mm pedunculated and sessile polyps                            with no bleeding and no stigmata of recent bleeding                            were found in the gastric fundus and in the gastric                            body. Six larger polyps were removed with a cold                            snare. Resection and retrieval were complete.                           The exam of the stomach was otherwise normal. No                            gastric varices noted.  The duodenal bulb and second portion of the                            duodenum were normal. Complications:            No immediate complications. Estimated Blood Loss:     Estimated blood loss was minimal. Impression:               - Z-line variable, 40 cm from the incisors.                           - Multiple gastric polyps. Resected and retrieved.                           - Normal duodenal bulb and second portion of the                            duodenum. Recommendation:           - Patient has a contact number available for                            emergencies. The signs and symptoms of potential                            delayed complications were discussed with the                            patient. Return to normal activities tomorrow.                            Written discharge instructions were provided to the                            patient.                           - Resume previous diet.                           - Continue  present medications.                           - Await pathology results.                           - Repeat upper endoscopy in 2 years for screening                            purposes. Ladene Artist, MD 05/04/2019 10:04:26 AM This report has been signed electronically.

## 2019-05-04 NOTE — Progress Notes (Signed)
Called to room to assist during endoscopic procedure.  Patient ID and intended procedure confirmed with present staff. Received instructions for my participation in the procedure from the performing physician.  

## 2019-05-04 NOTE — Progress Notes (Signed)
Temp by LC, Vitals by DT

## 2019-05-04 NOTE — Progress Notes (Signed)
Report given to PACU, vss 

## 2019-05-07 ENCOUNTER — Telehealth: Payer: Self-pay | Admitting: *Deleted

## 2019-05-07 ENCOUNTER — Ambulatory Visit (INDEPENDENT_AMBULATORY_CARE_PROVIDER_SITE_OTHER): Payer: Medicare Other | Admitting: Family Medicine

## 2019-05-07 ENCOUNTER — Encounter (INDEPENDENT_AMBULATORY_CARE_PROVIDER_SITE_OTHER): Payer: Self-pay | Admitting: Family Medicine

## 2019-05-07 ENCOUNTER — Other Ambulatory Visit: Payer: Self-pay

## 2019-05-07 VITALS — BP 119/60 | HR 77 | Temp 98.0°F | Ht 65.0 in | Wt 226.0 lb

## 2019-05-07 DIAGNOSIS — E8881 Metabolic syndrome: Secondary | ICD-10-CM | POA: Diagnosis not present

## 2019-05-07 DIAGNOSIS — Z9189 Other specified personal risk factors, not elsewhere classified: Secondary | ICD-10-CM | POA: Diagnosis not present

## 2019-05-07 DIAGNOSIS — K7469 Other cirrhosis of liver: Secondary | ICD-10-CM | POA: Diagnosis not present

## 2019-05-07 DIAGNOSIS — E559 Vitamin D deficiency, unspecified: Secondary | ICD-10-CM

## 2019-05-07 DIAGNOSIS — Z6837 Body mass index (BMI) 37.0-37.9, adult: Secondary | ICD-10-CM

## 2019-05-07 DIAGNOSIS — E038 Other specified hypothyroidism: Secondary | ICD-10-CM | POA: Diagnosis not present

## 2019-05-07 DIAGNOSIS — E7849 Other hyperlipidemia: Secondary | ICD-10-CM

## 2019-05-07 NOTE — Telephone Encounter (Signed)
  Follow up Call-  Call back number 05/04/2019 03/03/2017  Post procedure Call Back phone  # [phone]  Permission to leave phone message Yes Yes  Some recent data might be hidden     Patient questions:  Do you have a fever, pain , or abdominal swelling? No. Pain Score  0 *  Have you tolerated food without any problems? Yes.    Have you been able to return to your normal activities? Yes.    Do you have any questions about your discharge instructions: Diet   No. Medications  No. Follow up visit  No.  Do you have questions or concerns about your Care? No.  Actions: * If pain score is 4 or above: No action needed, pain <4.

## 2019-05-08 MED ORDER — VITAMIN D (ERGOCALCIFEROL) 1.25 MG (50000 UNIT) PO CAPS: 50000.0000 [IU] | ORAL_CAPSULE | ORAL | 0 refills | Status: DC

## 2019-05-08 MED ORDER — LEVOTHYROXINE SODIUM 50 MCG PO TABS: 50.0000 ug | ORAL_TABLET | Freq: Every day | ORAL | 0 refills | Status: DC

## 2019-05-08 NOTE — Progress Notes (Signed)
Chief Complaint:   OBESITY Amanda Hoover is here to discuss her progress with her obesity treatment plan along with follow-up of her obesity related diagnoses. Amanda Hoover is on the Category 2 Plan and states she is following her eating plan approximately 50% of the time. Amanda Hoover states she is walking at work, 8 minutes 5 times per week.  Today's visit was #: 2 Starting weight: 233 lbs Starting date: 04/23/2019 Today's weight: 226 lbs Today's date: 05/07/2019 Total lbs lost to date: 7 Total lbs lost since last in-office visit: 7  Interim History: Amanda Hoover voices she didn't follow the plan as much as she would like, secondary to losing power and then having an endoscopy and that showing numerous polyps; apparently caused by Protonix. Amanda Hoover is being fastidious about her snack calories.  Subjective:   Insulin resistance Amanda Hoover has a new diagnosis of insulin resistance based on her elevated fasting insulin level >5. Her last Hgb A1c was at 5.2 and last insulin level was at 19.3 (04/23/19). She is working on diet and exercise to decrease her risk of diabetes. Amanda Hoover admits to carb cravings.  Lab Results  Component Value Date   INSULIN 19.3 04/23/2019   Lab Results  Component Value Date   HGBA1C 5.2 04/23/2019   Other specified hypothyroidism  Amanda Hoover's last TSH was 9.760 (04/23/19). She denies cold intolerance, heat intolerance or palpitations. Amanda Hoover is on levothyroxine 25 mcg PO daily.  Lab Results  Component Value Date   TSH 9.760 (H) 04/23/2019   Other hyperlipidemia Amanda Hoover has hyperlipidemia and she has LDL of 102, HDL of 58 and triglycerides of 74 (04/23/19). She is not on statin. Amanda Hoover has been trying to improve her cholesterol levels with intensive lifestyle modification including a low saturated fat diet, exercise and weight loss.   Lab Results  Component Value Date   ALT 50 (H) 04/23/2019   AST 61 (H) 04/23/2019   ALKPHOS 132 (H) 04/23/2019   BILITOT 0.9 04/23/2019   Lab  Results  Component Value Date   CHOL 174 04/23/2019   HDL 58 04/23/2019   LDLCALC 102 (H) 04/23/2019   TRIG 74 04/23/2019   CHOLHDL 4.1 10/31/2018   Other cirrhosis of liver (HCC) Amanda Hoover's LFTs are stable from a previous lab draw. She sees Dr. Fuller Plan and recent endoscopy is showing no varices.  Vitamin D deficiency  Amanda Hoover's Vitamin D level was 34.8 on 04/23/19. She is not on vit D supplementation.   At risk for diabetes mellitus Amanda Hoover is at higher than average risk for developing diabetes due to her obesity and insulin resistance.   Assessment/Plan:   Insulin resistance Tara will continue to work on weight loss, exercise, and decreasing simple carbohydrates to help decrease the risk of diabetes. No medicine is prescribed at this time. We will repeat labs in 3 months. Amanda Hoover agreed to follow-up with [place] as directed to closely monitor her progress.  Other specified hypothyroidism  Patient with long-standing hypothyroidism, on levothyroxine therapy. She appears euthyroid. Amanda Hoover agrees to increase levothyroxine (SYNTHROID) to 50 MCG PO qAM #30 with no refills. Orders and follow up as documented in patient record.  Counseling . Good thyroid control is important for overall health. Supratherapeutic thyroid levels are dangerous and will not improve weight loss results. . The correct way to take levothyroxine is fasting, with water, separated by at least 30 minutes from breakfast, and separated by more than 4 hours from calcium, iron, multivitamins, acid reflux medications (PPIs).   Other hyperlipidemia  We will repeat labs in 3 months. Amanda Hoover will continue to work on diet, exercise and weight loss efforts. Orders and follow up as documented in patient record.   Other cirrhosis of liver (HCC) Amanda Hoover will follow up with Dr. Fuller Plan.  Vitamin D deficiency Low Vitamin D level contributes to fatigue and are associated with obesity, breast, and colon cancer. Amanda Hoover agrees to start prescription  Vitamin D @50 ,000 IU every week #4 with no refills and she will follow-up for routine testing of Vitamin D, at least 2-3 times per year to avoid over-replacement.  At risk for diabetes mellitus Amanda Hoover was given approximately 15 minutes of diabetes education and counseling today. We discussed intensive lifestyle modifications today with an emphasis on weight loss as well as increasing exercise and decreasing simple carbohydrates in her diet. We also reviewed medication options with an emphasis on risk versus benefit of those discussed.   Repetitive spaced learning was employed today to elicit superior memory formation and behavioral change.  Class 2 severe obesity with serious comorbidity and body mass index (BMI) of 37.0 to 37.9 in adult, unspecified obesity type (HCC) Amanda Hoover is currently in the action stage of change. As such, her goal is to continue with weight loss efforts. She has agreed to the Category 2 Plan.   Behavioral modification strategies: increasing lean protein intake, increasing vegetables, meal planning and cooking strategies, keeping healthy foods in the home and planning for success.  Amanda Hoover has agreed to follow-up with our clinic in 2 weeks. She was informed of the importance of frequent follow-up visits to maximize her success with intensive lifestyle modifications for her multiple health conditions.   Objective:   Blood pressure 119/60, pulse 77, temperature 98 F (36.7 C), temperature source Oral, height 5\' 5"  (1.651 m), weight 226 lb (102.5 kg), SpO2 98 %. Body mass index is 37.61 kg/m.  General: Cooperative, alert, well developed, in no acute distress. HEENT: Conjunctivae and lids unremarkable. Cardiovascular: Regular rhythm.  Lungs: Normal work of breathing. Neurologic: No focal deficits.   Lab Results  Component Value Date   CREATININE 0.99 04/23/2019   BUN 13 04/23/2019   NA 142 04/23/2019   K 4.1 04/23/2019   CL 105 04/23/2019   CO2 23 04/23/2019   Lab  Results  Component Value Date   ALT 50 (H) 04/23/2019   AST 61 (H) 04/23/2019   ALKPHOS 132 (H) 04/23/2019   BILITOT 0.9 04/23/2019   Lab Results  Component Value Date   HGBA1C 5.2 04/23/2019   HGBA1C 4.8 06/17/2016   Lab Results  Component Value Date   INSULIN 19.3 04/23/2019   Lab Results  Component Value Date   TSH 9.760 (H) 04/23/2019   Lab Results  Component Value Date   CHOL 174 04/23/2019   HDL 58 04/23/2019   LDLCALC 102 (H) 04/23/2019   TRIG 74 04/23/2019   CHOLHDL 4.1 10/31/2018   Lab Results  Component Value Date   WBC 5.0 04/23/2019   HGB 15.0 04/23/2019   HCT 45.3 04/23/2019   MCV 97 04/23/2019   PLT 155 04/23/2019   Lab Results  Component Value Date   IRON 112 01/01/2019   IRON 112 01/01/2019   FERRITIN 88.0 01/01/2019    Ref. Range 04/23/2019 12:45  Vitamin D, 25-Hydroxy Latest Ref Range: 30.0 - 100.0 ng/mL 34.8    Attestation Statements:   Reviewed by clinician on day of visit: allergies, medications, problem list, medical history, surgical history, family history, social history, and previous encounter  notes.  I, Doreene Nest, am acting as transcriptionist for Eber Jones, MD.  I have reviewed the above documentation for accuracy and completeness, and I agree with the above. - Ilene Qua, MD

## 2019-05-10 ENCOUNTER — Encounter: Payer: Self-pay | Admitting: Gastroenterology

## 2019-05-22 ENCOUNTER — Encounter (INDEPENDENT_AMBULATORY_CARE_PROVIDER_SITE_OTHER): Payer: Self-pay | Admitting: Family Medicine

## 2019-05-22 ENCOUNTER — Telehealth: Payer: Self-pay

## 2019-05-22 ENCOUNTER — Other Ambulatory Visit: Payer: Self-pay

## 2019-05-22 ENCOUNTER — Ambulatory Visit (INDEPENDENT_AMBULATORY_CARE_PROVIDER_SITE_OTHER): Payer: Medicare Other | Admitting: Family Medicine

## 2019-05-22 VITALS — BP 121/68 | HR 74 | Temp 98.1°F | Ht 64.0 in | Wt 220.0 lb

## 2019-05-22 DIAGNOSIS — E038 Other specified hypothyroidism: Secondary | ICD-10-CM | POA: Diagnosis not present

## 2019-05-22 DIAGNOSIS — K7460 Unspecified cirrhosis of liver: Secondary | ICD-10-CM

## 2019-05-22 DIAGNOSIS — E559 Vitamin D deficiency, unspecified: Secondary | ICD-10-CM

## 2019-05-22 DIAGNOSIS — Z6836 Body mass index (BMI) 36.0-36.9, adult: Secondary | ICD-10-CM | POA: Diagnosis not present

## 2019-05-22 NOTE — Telephone Encounter (Signed)
-----   Message from Lindon Romp, [place] sent at 05/14/2019  3:01 PM EST -----  ----- Message ----- From: Lindon Romp, CMA Sent: 05/14/2019 To: Marzella Schlein, CMA  Patient needs CMP, CBC, PT/INR, and AFP, RUQ abd ultrasound for [facility] screening/cirrhosis for March

## 2019-05-22 NOTE — Telephone Encounter (Signed)
I spoke with Amanda Hoover and she is aware that her U/S appointment is March 16th at 9:15 AM, check in at 9:00AM at [facility], [address]. Afterwards she will come here and have her labs drawn. She is concerned about having so many polyps in her stomach. I told her after Dr Fuller Plan gets these results he will let her know when to return to see him.

## 2019-05-22 NOTE — Progress Notes (Signed)
Chief Complaint:   OBESITY Amanda Hoover is here to discuss her progress with her obesity treatment plan along with follow-up of her obesity related diagnoses. Amanda Hoover is on the Category 2 Plan and states she is following her eating plan approximately 90% of the time. Amanda Hoover states she is working at [employer] 8 minutes 5 times per week.  Today's visit was #: 3 Starting weight: 233 lbs Starting date: 04/23/2019 Today's weight: 220 lbs Today's date: 05/22/2019 Total lbs lost to date: 13 Total lbs lost since last in-office visit: 6  Interim History: The last few weeks, patient did well following the plan. She is looking for substitution at dinner, if she doesn't want to eat dinner meat and vegetable. Patient is planning a trip to see her grandchildren in April, in [place].  Subjective:   Other specified hypothyroidism Amanda Hoover's last TSH was 9.760. She is on levothyroxine. [name] denies cold intolerance, heat intolerance or palpitations.  Lab Results  Component Value Date   TSH 9.760 (H) 04/23/2019   Vitamin D deficiency Amanda Hoover's Vitamin D level was 34.8 on 04/23/19. She is on prescription vit D. She admits fatigue and denies nausea, vomiting or muscle weakness.  Assessment/Plan:   Other specified hypothyroidism Patient with long-standing hypothyroidism, on levothyroxine therapy. She appears euthyroid. We will recheck TSH in 2 appointments. Orders and follow up as documented in patient record.  Counseling . Good thyroid control is important for overall health. Supratherapeutic thyroid levels are dangerous and will not improve weight loss results. . The correct way to take levothyroxine is fasting, with water, separated by at least 30 minutes from breakfast, and separated by more than 4 hours from calcium, iron, multivitamins, acid reflux medications (PPIs).   Vitamin D deficiency Low Vitamin D level contributes to fatigue and are associated with obesity, breast, and colon cancer.  Amanda Hoover will continue to take prescription Vitamin D @50 ,000 IU every week (no refill needed) and she will follow-up for routine testing of Vitamin D, at least 2-3 times per year to avoid over-replacement.  Class 2 severe obesity with serious comorbidity and body mass index (BMI) of 36.0 to 36.9 in adult, unspecified obesity type (HCC) [name] is currently in the action stage of change. As such, her goal is to continue with weight loss efforts. She has agreed to the Category 2 Plan.   Behavioral modification strategies: increasing lean protein intake, increasing vegetables, meal planning and cooking strategies, keeping healthy foods in the home and planning for success.  Amanda Hoover has agreed to follow-up with our clinic in 2 weeks. She was informed of the importance of frequent follow-up visits to maximize her success with intensive lifestyle modifications for her multiple health conditions.   Objective:   Blood pressure 121/68, pulse 74, temperature 98.1 F (36.7 C), temperature source Oral, height 5\' 4"  (1.626 m), weight 220 lb (99.8 kg), SpO2 99 %. Body mass index is 37.76 kg/m.  General: Cooperative, alert, well developed, in no acute distress. HEENT: Conjunctivae and lids unremarkable. Cardiovascular: Regular rhythm.  Lungs: Normal work of breathing. Neurologic: No focal deficits.   Lab Results  Component Value Date   CREATININE 0.99 04/23/2019   BUN 13 04/23/2019   NA 142 04/23/2019   K 4.1 04/23/2019   CL 105 04/23/2019   CO2 23 04/23/2019   Lab Results  Component Value Date   ALT 50 (H) 04/23/2019   AST 61 (H) 04/23/2019   ALKPHOS 132 (H) 04/23/2019   BILITOT 0.9 04/23/2019  Lab Results  Component Value Date   HGBA1C 5.2 04/23/2019   HGBA1C 4.8 06/17/2016   Lab Results  Component Value Date   INSULIN 19.3 04/23/2019   Lab Results  Component Value Date   TSH 9.760 (H) 04/23/2019   Lab Results  Component Value Date   CHOL 174 04/23/2019   HDL 58 04/23/2019    LDLCALC 102 (H) 04/23/2019   TRIG 74 04/23/2019   CHOLHDL 4.1 10/31/2018   Lab Results  Component Value Date   WBC 5.0 04/23/2019   HGB 15.0 04/23/2019   HCT 45.3 04/23/2019   MCV 97 04/23/2019   PLT 155 04/23/2019   Lab Results  Component Value Date   IRON 112 01/01/2019   IRON 112 01/01/2019   FERRITIN 88.0 01/01/2019    Ref. Range 04/23/2019 12:45  Vitamin D, 25-Hydroxy Latest Ref Range: 30.0 - 100.0 ng/mL 34.8    Attestation Statements:   Reviewed by clinician on day of visit: allergies, medications, problem list, medical history, surgical history, family history, social history, and previous encounter notes.  Time spent on visit including pre-visit chart review and post-visit charting and care was 15 minutes.   I, Doreene Nest, am acting as transcriptionist for Amanda Common, MD.  I have reviewed the above documentation for accuracy and completeness, and I agree with the above. - Ilene Qua, MD

## 2019-05-29 ENCOUNTER — Ambulatory Visit
Admission: RE | Admit: 2019-05-29 | Discharge: 2019-05-29 | Disposition: A | Discharge status: Home / Self Care | Payer: Medicare Other | Source: Ambulatory Visit | Attending: Gastroenterology | Admitting: Gastroenterology

## 2019-05-29 ENCOUNTER — Other Ambulatory Visit (INDEPENDENT_AMBULATORY_CARE_PROVIDER_SITE_OTHER): Payer: Medicare Other

## 2019-05-29 DIAGNOSIS — K7460 Unspecified cirrhosis of liver: Secondary | ICD-10-CM | POA: Diagnosis not present

## 2019-05-29 LAB — CBC WITH DIFFERENTIAL/PLATELET
Basophils Absolute: 0 10*3/uL (ref 0.0–0.1)
Basophils Relative: 1 % (ref 0.0–3.0)
Eosinophils Absolute: 0.1 10*3/uL (ref 0.0–0.7)
Eosinophils Relative: 3.6 % (ref 0.0–5.0)
HCT: 42.5 % (ref 36.0–46.0)
Hemoglobin: 14.8 g/dL (ref 12.0–15.0)
Lymphocytes Relative: 25.9 % (ref 12.0–46.0)
Lymphs Abs: 1 10*3/uL (ref 0.7–4.0)
MCHC: 34.7 g/dL (ref 30.0–36.0)
MCV: 96.1 fl (ref 78.0–100.0)
Monocytes Absolute: 0.4 10*3/uL (ref 0.1–1.0)
Monocytes Relative: 10.9 % (ref 3.0–12.0)
Neutro Abs: 2.2 10*3/uL (ref 1.4–7.7)
Neutrophils Relative %: 58.6 % (ref 43.0–77.0)
Platelets: 122 10*3/uL — ABNORMAL LOW (ref 150.0–400.0)
RBC: 4.42 Mil/uL (ref 3.87–5.11)
RDW: 14.5 % (ref 11.5–15.5)
WBC: 3.8 10*3/uL — ABNORMAL LOW (ref 4.0–10.5)

## 2019-05-29 LAB — COMPREHENSIVE METABOLIC PANEL
ALT: 33 U/L (ref 0–35)
AST: 43 U/L — ABNORMAL HIGH (ref 0–37)
Albumin: 4.5 g/dL (ref 3.5–5.2)
Alkaline Phosphatase: 110 U/L (ref 39–117)
BUN: 17 mg/dL (ref 6–23)
CO2: 21 mEq/L (ref 19–32)
Calcium: 10.1 mg/dL (ref 8.4–10.5)
Chloride: 105 mEq/L (ref 96–112)
Creatinine, Ser: 0.84 mg/dL (ref 0.40–1.20)
GFR: 67.98 mL/min (ref >60.00)
Glucose, Bld: 99 mg/dL (ref 70–99)
Potassium: 4.7 mEq/L (ref 3.5–5.1)
Sodium: 138 mEq/L (ref 135–145)
Total Bilirubin: 1 mg/dL (ref 0.2–1.2)
Total Protein: 8 g/dL (ref 6.0–8.3)

## 2019-05-29 LAB — PROTIME-INR
INR: 1.1 ratio — ABNORMAL HIGH (ref 0.8–1.0)
Prothrombin Time: 12.6 s (ref 9.6–13.1)

## 2019-05-30 LAB — AFP TUMOR MARKER: AFP-Tumor Marker: 4.7 ng/mL

## 2019-05-31 ENCOUNTER — Telehealth: Payer: Self-pay | Admitting: Gastroenterology

## 2019-05-31 ENCOUNTER — Other Ambulatory Visit: Payer: Self-pay

## 2019-05-31 DIAGNOSIS — K7460 Unspecified cirrhosis of liver: Secondary | ICD-10-CM

## 2019-05-31 NOTE — Telephone Encounter (Signed)
Patient is returning call in reference to results.

## 2019-05-31 NOTE — Telephone Encounter (Signed)
See results for additional details.  

## 2019-06-05 ENCOUNTER — Ambulatory Visit (INDEPENDENT_AMBULATORY_CARE_PROVIDER_SITE_OTHER): Payer: Medicare Other | Admitting: Family Medicine

## 2019-06-05 ENCOUNTER — Other Ambulatory Visit: Payer: Self-pay

## 2019-06-05 ENCOUNTER — Other Ambulatory Visit (INDEPENDENT_AMBULATORY_CARE_PROVIDER_SITE_OTHER): Payer: Self-pay | Admitting: Family Medicine

## 2019-06-05 ENCOUNTER — Encounter (INDEPENDENT_AMBULATORY_CARE_PROVIDER_SITE_OTHER): Payer: Self-pay | Admitting: Family Medicine

## 2019-06-05 VITALS — BP 112/71 | HR 96 | Temp 98.1°F | Ht 64.0 in | Wt 217.0 lb

## 2019-06-05 DIAGNOSIS — Z6837 Body mass index (BMI) 37.0-37.9, adult: Secondary | ICD-10-CM

## 2019-06-05 DIAGNOSIS — Z9189 Other specified personal risk factors, not elsewhere classified: Secondary | ICD-10-CM

## 2019-06-05 DIAGNOSIS — E038 Other specified hypothyroidism: Secondary | ICD-10-CM

## 2019-06-05 DIAGNOSIS — E559 Vitamin D deficiency, unspecified: Secondary | ICD-10-CM | POA: Diagnosis not present

## 2019-06-05 NOTE — Progress Notes (Signed)
Chief Complaint:   OBESITY Amanda Hoover is here to discuss her progress with her obesity treatment plan along with follow-up of her obesity related diagnoses. Amanda Hoover is on the Category 2 Plan and states she is following her eating plan approximately 85-90% of the time. Amanda Hoover states she is just working.   Today's visit was #: 4 Starting weight: 233 lbs Starting date: 04/23/2019 Today's weight: 217 lbs Today's date: 06/05/2019 Total lbs lost to date: 16 Total lbs lost since last in-office visit: 3  Interim History: Amanda Hoover is doing pretty well following the meal plan. She is able to get in most of the food, but not always getting in all of the protein. She states she's never been much of a meat eater. She also finds herself struggling to get in vegetables secondary to preparation and figuring out how to add flavor.  Subjective:   Vitamin D deficiency. No nausea, vomiting, or muscle weakness, but she does endorse fatigue. She is on prescription Vitamin D. Last Vitamin D 34.8 on 04/23/2019.  Other specified hypothyroidism. Amanda Hoover is now on levothyroxine 50 mcg. No cold intolerance, heat intolerance, or palpitations.   Lab Results  Component Value Date   TSH 9.760 (H) 04/23/2019   At risk for osteoporosis. Amanda Hoover is at higher risk of osteopenia and osteoporosis due to Vitamin D deficiency.   Assessment/Plan:   Vitamin D deficiency. Low Vitamin D level contributes to fatigue and are associated with obesity, breast, and colon cancer. She was given a refill on her Vitamin D, Ergocalciferol, (DRISDOL) 1.25 MG (50000 UNIT) CAPS capsule every week #4 with 0 refills and will follow-up for routine testing of Vitamin D, at least 2-3 times per year to avoid over-replacement.    Other specified hypothyroidism. Patient with long-standing hypothyroidism, on levothyroxine therapy. She appears euthyroid. Orders and follow up as documented in patient record. Amanda Hoover was given a refill on her  levothyroxine (SYNTHROID) 50 MCG tablet PO QAM #30 with 0 refills. She will have labs in 4 weeks.  Counseling . Good thyroid control is important for overall health. Supratherapeutic thyroid levels are dangerous and will not improve weight loss results. . The correct way to take levothyroxine is fasting, with water, separated by at least 30 minutes from breakfast, and separated by more than 4 hours from calcium, iron, multivitamins, acid reflux medications (PPIs).     At risk for osteoporosis. Amanda Hoover was given approximately 15 minutes of osteoporosis prevention counseling today. Amanda Hoover is at risk for osteopenia and osteoporosis due to her Vitamin D deficiency. She was encouraged to take her Vitamin D and follow her higher calcium diet and increase strengthening exercise to help strengthen her bones and decrease her risk of osteopenia and osteoporosis.  Repetitive spaced learning was employed today to elicit superior memory formation and behavioral change.  Class 2 severe obesity with serious comorbidity and body mass index (BMI) of 37.0 to 37.9 in adult, unspecified obesity type ([place]).  Amanda Hoover is currently in the action stage of change. As such, her goal is to continue with weight loss efforts. She has agreed to the Category 2 Plan or the Pescatarian plan.   Exercise goals: Amanda Hoover will continue her current exercise regimen.  Behavioral modification strategies: increasing lean protein intake, increasing vegetables, meal planning and cooking strategies, keeping healthy foods in the home and planning for success.  Amanda Hoover has agreed to follow-up with our clinic in 2 weeks. She was informed of the importance of frequent follow-up visits to  maximize her success with intensive lifestyle modifications for her multiple health conditions.   Objective:   Blood pressure 112/71, pulse 96, temperature 98.1 F (36.7 C), temperature source Oral, height 5\' 4"  (1.626 m), weight 217 lb (98.4 kg), SpO2 96  %. Body mass index is 37.25 kg/m.  General: Cooperative, alert, well developed, in no acute distress. HEENT: Conjunctivae and lids unremarkable. Cardiovascular: Regular rhythm.  Lungs: Normal work of breathing. Neurologic: No focal deficits.   Lab Results  Component Value Date   CREATININE 0.84 05/29/2019   BUN 17 05/29/2019   NA 138 05/29/2019   K 4.7 05/29/2019   CL 105 05/29/2019   CO2 21 05/29/2019   Lab Results  Component Value Date   ALT 33 05/29/2019   AST 43 (H) 05/29/2019   ALKPHOS 110 05/29/2019   BILITOT 1.0 05/29/2019   Lab Results  Component Value Date   HGBA1C 5.2 04/23/2019   HGBA1C 4.8 06/17/2016   Lab Results  Component Value Date   INSULIN 19.3 04/23/2019   Lab Results  Component Value Date   TSH 9.760 (H) 04/23/2019   Lab Results  Component Value Date   CHOL 174 04/23/2019   HDL 58 04/23/2019   LDLCALC 102 (H) 04/23/2019   TRIG 74 04/23/2019   CHOLHDL 4.1 10/31/2018   Lab Results  Component Value Date   WBC 3.8 (L) 05/29/2019   HGB 14.8 05/29/2019   HCT 42.5 05/29/2019   MCV 96.1 05/29/2019   PLT 122.0 (L) 05/29/2019   Lab Results  Component Value Date   IRON 112 01/01/2019   IRON 112 01/01/2019   FERRITIN 88.0 01/01/2019   Attestation Statements:   Reviewed by clinician on day of visit: allergies, medications, problem list, medical history, surgical history, family history, social history, and previous encounter notes.  I, Michaelene Song, am acting as transcriptionist for Coralie Common, MD   I have reviewed the above documentation for accuracy and completeness, and I agree with the above. - Ilene Qua, MD

## 2019-06-06 MED ORDER — VITAMIN D (ERGOCALCIFEROL) 1.25 MG (50000 UNIT) PO CAPS: 50000.0000 [IU] | ORAL_CAPSULE | ORAL | 0 refills | Status: DC

## 2019-06-06 MED ORDER — LEVOTHYROXINE SODIUM 50 MCG PO TABS: 50.0000 ug | ORAL_TABLET | Freq: Every day | ORAL | 0 refills | Status: DC

## 2019-06-07 ENCOUNTER — Other Ambulatory Visit (INDEPENDENT_AMBULATORY_CARE_PROVIDER_SITE_OTHER): Payer: Self-pay | Admitting: Family Medicine

## 2019-06-07 DIAGNOSIS — E038 Other specified hypothyroidism: Secondary | ICD-10-CM

## 2019-06-14 ENCOUNTER — Other Ambulatory Visit (INDEPENDENT_AMBULATORY_CARE_PROVIDER_SITE_OTHER): Payer: Self-pay | Admitting: Family Medicine

## 2019-06-14 DIAGNOSIS — E038 Other specified hypothyroidism: Secondary | ICD-10-CM

## 2019-06-18 DIAGNOSIS — Z853 Personal history of malignant neoplasm of breast: Secondary | ICD-10-CM | POA: Diagnosis not present

## 2019-06-18 DIAGNOSIS — N6312 Unspecified lump in the right breast, upper inner quadrant: Secondary | ICD-10-CM | POA: Diagnosis not present

## 2019-06-18 DIAGNOSIS — N6315 Unspecified lump in the right breast, overlapping quadrants: Secondary | ICD-10-CM | POA: Diagnosis not present

## 2019-06-20 ENCOUNTER — Other Ambulatory Visit: Payer: Self-pay | Admitting: Obstetrics and Gynecology

## 2019-06-20 DIAGNOSIS — N6310 Unspecified lump in the right breast, unspecified quadrant: Secondary | ICD-10-CM

## 2019-06-21 ENCOUNTER — Ambulatory Visit
Admission: RE | Admit: 2019-06-21 | Discharge: 2019-06-21 | Disposition: A | Discharge status: Home / Self Care | Payer: Medicare Other | Source: Ambulatory Visit | Attending: Obstetrics and Gynecology | Admitting: Obstetrics and Gynecology

## 2019-06-21 ENCOUNTER — Ambulatory Visit (INDEPENDENT_AMBULATORY_CARE_PROVIDER_SITE_OTHER): Payer: Medicare Other | Admitting: Family Medicine

## 2019-06-21 ENCOUNTER — Other Ambulatory Visit: Payer: Self-pay

## 2019-06-21 ENCOUNTER — Encounter (INDEPENDENT_AMBULATORY_CARE_PROVIDER_SITE_OTHER): Payer: Self-pay | Admitting: Family Medicine

## 2019-06-21 VITALS — BP 128/69 | HR 61 | Temp 98.0°F | Ht 64.0 in | Wt 215.0 lb

## 2019-06-21 DIAGNOSIS — K760 Fatty (change of) liver, not elsewhere classified: Secondary | ICD-10-CM | POA: Diagnosis not present

## 2019-06-21 DIAGNOSIS — N6310 Unspecified lump in the right breast, unspecified quadrant: Secondary | ICD-10-CM

## 2019-06-21 DIAGNOSIS — R928 Other abnormal and inconclusive findings on diagnostic imaging of breast: Secondary | ICD-10-CM | POA: Diagnosis not present

## 2019-06-21 DIAGNOSIS — E559 Vitamin D deficiency, unspecified: Secondary | ICD-10-CM

## 2019-06-21 DIAGNOSIS — Z6837 Body mass index (BMI) 37.0-37.9, adult: Secondary | ICD-10-CM

## 2019-06-21 DIAGNOSIS — N641 Fat necrosis of breast: Secondary | ICD-10-CM | POA: Diagnosis not present

## 2019-06-21 DIAGNOSIS — E66812 Obesity, class 2: Secondary | ICD-10-CM

## 2019-06-21 NOTE — Progress Notes (Signed)
Chief Complaint:   OBESITY Amanda Hoover is here to discuss her progress with her obesity treatment plan along with follow-up of her obesity related diagnoses. Amanda Hoover is on the Category 2 Plan or the Pescatarian plan and states she is following her eating plan approximately 60% of the time. Amanda Hoover states she is exercising 0 minutes 0 times per week.  Today's visit was #: 5 Starting weight: 233 lbs Starting date: 04/23/2019 Today's weight: 215 lbs Today's date: 06/21/2019 Total lbs lost to date: 18 Total lbs lost since last in-office visit: 2  Interim History: Amanda Hoover has had a very eventful last 2 weeks - husband had hernia repair x2, she found a lump in her breast (is getting a mammogram and ultrasound of the breast today), and a young coworker killed himself. She doesn't think she's been eating enough. Amanda Hoover is hoping to go to [place] next week.  Subjective:   NAFLD (nonalcoholic fatty liver disease). Last INR 1.1, platelets 122, AST 43, but ALT 33. Amanda Hoover sees and follows with GI.  Vitamin D deficiency. No nausea, vomiting, or muscle weakness. She endorses fatigue. Amanda Hoover is on prescription Vitamin D. Last Vitamin D 34.8 on 04/23/2019.  Assessment/Plan:   NAFLD (nonalcoholic fatty liver disease). Amanda Hoover will have repeat labs in 1 month.  Vitamin D deficiency. Low Vitamin D level contributes to fatigue and are associated with obesity, breast, and colon cancer. She agrees to continue to take prescription Vitamin D (no refill needed) and will follow-up for routine testing of Vitamin D, at least 2-3 times per year to avoid over-replacement.  Class 2 severe obesity with serious comorbidity and body mass index (BMI) of 37.0 to 37.9 in adult, unspecified obesity type ([place]).  Amanda Hoover is currently in the action stage of change. As such, her goal is to continue with weight loss efforts. She has agreed to the Category 2 Plan or the Pescatarian plan.   Exercise goals: No exercise has  been prescribed at this time.  Behavioral modification strategies: increasing lean protein intake, increasing vegetables, meal planning and cooking strategies, keeping healthy foods in the home and planning for success.  Amanda Hoover has agreed to follow-up with our clinic in 2 weeks. She was informed of the importance of frequent follow-up visits to maximize her success with intensive lifestyle modifications for her multiple health conditions.   Objective:   Blood pressure 128/69, pulse 61, temperature 98 F (36.7 C), temperature source Oral, height 5\' 4"  (1.626 m), weight 215 lb (97.5 kg), SpO2 96 %. Body mass index is 36.9 kg/m.  General: Cooperative, alert, well developed, in no acute distress. HEENT: Conjunctivae and lids unremarkable. Cardiovascular: Regular rhythm.  Lungs: Normal work of breathing. Neurologic: No focal deficits.   Lab Results  Component Value Date   CREATININE 0.84 05/29/2019   BUN 17 05/29/2019   NA 138 05/29/2019   K 4.7 05/29/2019   CL 105 05/29/2019   CO2 21 05/29/2019   Lab Results  Component Value Date   ALT 33 05/29/2019   AST 43 (H) 05/29/2019   ALKPHOS 110 05/29/2019   BILITOT 1.0 05/29/2019   Lab Results  Component Value Date   HGBA1C 5.2 04/23/2019   HGBA1C 4.8 06/17/2016   Lab Results  Component Value Date   INSULIN 19.3 04/23/2019   Lab Results  Component Value Date   TSH 9.760 (H) 04/23/2019   Lab Results  Component Value Date   CHOL 174 04/23/2019   HDL 58 04/23/2019   [place]  102 (H) 04/23/2019   TRIG 74 04/23/2019   CHOLHDL 4.1 10/31/2018   Lab Results  Component Value Date   WBC 3.8 (L) 05/29/2019   HGB 14.8 05/29/2019   HCT 42.5 05/29/2019   MCV 96.1 05/29/2019   PLT 122.0 (L) 05/29/2019   Lab Results  Component Value Date   IRON 112 01/01/2019   IRON 112 01/01/2019   FERRITIN 88.0 01/01/2019   Attestation Statements:   Reviewed by clinician on day of visit: allergies, medications, problem list, medical  history, surgical history, family history, social history, and previous encounter notes.  Time spent on visit including pre-visit chart review and post-visit charting and care was 15 minutes.   I, Michaelene Song, am acting as transcriptionist for Coralie Common, MD   I have reviewed the above documentation for accuracy and completeness, and I agree with the above. - Ilene Qua, MD

## 2019-06-28 ENCOUNTER — Ambulatory Visit (INDEPENDENT_AMBULATORY_CARE_PROVIDER_SITE_OTHER): Payer: Medicare Other | Admitting: Family Medicine

## 2019-07-05 ENCOUNTER — Other Ambulatory Visit: Payer: Self-pay

## 2019-07-05 ENCOUNTER — Encounter (INDEPENDENT_AMBULATORY_CARE_PROVIDER_SITE_OTHER): Payer: Self-pay | Admitting: Family Medicine

## 2019-07-05 ENCOUNTER — Ambulatory Visit (INDEPENDENT_AMBULATORY_CARE_PROVIDER_SITE_OTHER): Payer: Medicare Other | Admitting: Family Medicine

## 2019-07-05 VITALS — BP 110/74 | HR 102 | Temp 97.7°F | Ht 64.0 in | Wt 212.0 lb

## 2019-07-05 DIAGNOSIS — Z9189 Other specified personal risk factors, not elsewhere classified: Secondary | ICD-10-CM | POA: Diagnosis not present

## 2019-07-05 DIAGNOSIS — E038 Other specified hypothyroidism: Secondary | ICD-10-CM

## 2019-07-05 DIAGNOSIS — E559 Vitamin D deficiency, unspecified: Secondary | ICD-10-CM | POA: Diagnosis not present

## 2019-07-05 DIAGNOSIS — Z6836 Body mass index (BMI) 36.0-36.9, adult: Secondary | ICD-10-CM

## 2019-07-06 LAB — TSH: TSH: 1.92 u[IU]/mL (ref 0.450–4.500)

## 2019-07-09 ENCOUNTER — Other Ambulatory Visit: Payer: Self-pay

## 2019-07-09 ENCOUNTER — Ambulatory Visit (INDEPENDENT_AMBULATORY_CARE_PROVIDER_SITE_OTHER): Payer: Medicare Other | Admitting: Gastroenterology

## 2019-07-09 DIAGNOSIS — K7460 Unspecified cirrhosis of liver: Secondary | ICD-10-CM

## 2019-07-09 DIAGNOSIS — Z23 Encounter for immunization: Secondary | ICD-10-CM

## 2019-07-09 NOTE — Progress Notes (Signed)
Chief Complaint:   OBESITY Amanda Hoover is here to discuss her progress with her obesity treatment plan along with follow-up of her obesity related diagnoses. Amanda Hoover is on the Category 2 Plan or the [place] and states she is following her eating plan approximately 70-75% of the time. Amanda Hoover states she is walking for 30-60 minutes 5 times per week.  Today's visit was #: 6 Starting weight: 233 lbs Starting date: 04/23/2019 Today's weight: 212 lbs Today's date: 07/05/2019 Total lbs lost to date: 21 lbs Total lbs lost since last in-office visit: 3 lbs  Interim History: Amanda Hoover reports trying to remember to take vitamin D is difficult.  [place]  She voices that even while away she tried to make healthier options, but did notice she had less control.  She wants to recommit to the plan.  She voices that sometimes she cannot finish all the food on the plan.  Subjective:   1. Other specified hypothyroidism Amanda Hoover is taking levothyroxine 50 mcg daily.   Lab Results  Component Value Date   TSH 1.920 07/05/2019   2. Vitamin D deficiency Amanda Hoover Vitamin D level was 34.8 on 04/23/2019. She is currently taking prescription vitamin D 50,000 IU each week. She denies nausea, vomiting or muscle weakness.  She endorses fatigue.  3. At risk for osteoporosis Amanda Hoover is at higher risk of osteopenia and osteoporosis due to Vitamin D deficiency.   Assessment/Plan:   1. Other specified hypothyroidism Patient with long-standing hypothyroidism, on levothyroxine therapy. She appears euthyroid. Orders and follow up as documented in patient record.  Counseling . Good thyroid control is important for overall health. Supratherapeutic thyroid levels are dangerous and will not improve weight loss results. . The correct way to take levothyroxine is fasting, with water, separated by at least 30 minutes from breakfast, and separated by more than 4 hours from calcium, iron,  multivitamins, acid reflux medications (PPIs).  - TSH  2. Vitamin D deficiency Low Vitamin D level contributes to fatigue and are associated with obesity, breast, and colon cancer. She agrees to continue to take prescription Vitamin D @50 ,000 IU every week and will follow-up for routine testing of Vitamin D, at least 2-3 times per year to avoid over-replacement.  3. At risk for osteoporosis Amanda Hoover was given approximately 15 minutes of osteoporosis prevention counseling today. Amanda Hoover is at risk for osteopenia and osteoporosis due to her Vitamin D deficiency. She was encouraged to take her Vitamin D and follow her higher calcium diet and increase strengthening exercise to help strengthen her bones and decrease her risk of osteopenia and osteoporosis.  Repetitive spaced learning was employed today to elicit superior memory formation and behavioral change.  4. Class 2 severe obesity with serious comorbidity and body mass index (BMI) of 36.0 to 36.9 in adult, unspecified obesity type ([facility]) Amanda Hoover is currently in the action stage of change. As such, her goal is to continue with weight loss efforts. She has agreed to the Category 2 Plan or the [place].   Exercise goals: As is.  Behavioral modification strategies: increasing lean protein intake, increasing vegetables, meal planning and cooking strategies, keeping healthy foods in the home and planning for success.  Amanda Hoover has agreed to follow-up with our clinic in 2 weeks. She was informed of the importance of frequent follow-up visits to maximize her success with intensive lifestyle modifications for her multiple health conditions.   Amanda Hoover was informed we would discuss her lab results  at her next visit unless there is a critical issue that needs to be addressed sooner. Amanda Hoover agreed to keep her next visit at the agreed upon time to discuss these results.  Objective:   Blood pressure 110/74, pulse (!) 102, temperature 97.7 F (36.5 C),  temperature source Oral, height 5\' 4"  (1.626 m), weight 212 lb (96.2 kg), SpO2 93 %. Body mass index is 36.39 kg/m.  General: Cooperative, alert, well developed, in no acute distress. HEENT: Conjunctivae and lids unremarkable. Cardiovascular: Regular rhythm.  Lungs: Normal work of breathing. Neurologic: No focal deficits.   Lab Results  Component Value Date   CREATININE 0.84 05/29/2019   BUN 17 05/29/2019   NA 138 05/29/2019   K 4.7 05/29/2019   CL 105 05/29/2019   CO2 21 05/29/2019   Lab Results  Component Value Date   ALT 33 05/29/2019   AST 43 (H) 05/29/2019   ALKPHOS 110 05/29/2019   BILITOT 1.0 05/29/2019   Lab Results  Component Value Date   HGBA1C 5.2 04/23/2019   HGBA1C 4.8 06/17/2016   Lab Results  Component Value Date   INSULIN 19.3 04/23/2019   Lab Results  Component Value Date   TSH 1.920 07/05/2019   Lab Results  Component Value Date   CHOL 174 04/23/2019   HDL 58 04/23/2019   LDLCALC 102 (H) 04/23/2019   TRIG 74 04/23/2019   CHOLHDL 4.1 10/31/2018   Lab Results  Component Value Date   WBC 3.8 (L) 05/29/2019   HGB 14.8 05/29/2019   HCT 42.5 05/29/2019   MCV 96.1 05/29/2019   PLT 122.0 (L) 05/29/2019   Lab Results  Component Value Date   IRON 112 01/01/2019   IRON 112 01/01/2019   FERRITIN 88.0 01/01/2019   Attestation Statements:   Reviewed by clinician on day of visit: allergies, medications, problem list, medical history, surgical history, family history, social history, and previous encounter notes.  I, Water quality scientist, CMA, am acting as transcriptionist for Coralie Common, MD.  I have reviewed the above documentation for accuracy and completeness, and I agree with the above. - Ilene Qua, MD

## 2019-07-10 MED ORDER — LEVOTHYROXINE SODIUM 50 MCG PO TABS: 50.0000 ug | ORAL_TABLET | Freq: Every day | ORAL | 0 refills | Status: DC

## 2019-07-12 ENCOUNTER — Other Ambulatory Visit (INDEPENDENT_AMBULATORY_CARE_PROVIDER_SITE_OTHER): Payer: Self-pay | Admitting: Family Medicine

## 2019-07-12 ENCOUNTER — Encounter (INDEPENDENT_AMBULATORY_CARE_PROVIDER_SITE_OTHER): Payer: Self-pay

## 2019-07-12 DIAGNOSIS — E559 Vitamin D deficiency, unspecified: Secondary | ICD-10-CM

## 2019-07-14 ENCOUNTER — Other Ambulatory Visit (INDEPENDENT_AMBULATORY_CARE_PROVIDER_SITE_OTHER): Payer: Self-pay | Admitting: Family Medicine

## 2019-07-14 DIAGNOSIS — E038 Other specified hypothyroidism: Secondary | ICD-10-CM

## 2019-07-16 ENCOUNTER — Other Ambulatory Visit (INDEPENDENT_AMBULATORY_CARE_PROVIDER_SITE_OTHER): Payer: Self-pay | Admitting: Family Medicine

## 2019-07-16 DIAGNOSIS — E038 Other specified hypothyroidism: Secondary | ICD-10-CM

## 2019-07-24 ENCOUNTER — Encounter (INDEPENDENT_AMBULATORY_CARE_PROVIDER_SITE_OTHER): Payer: Self-pay | Admitting: Family Medicine

## 2019-07-24 ENCOUNTER — Ambulatory Visit (INDEPENDENT_AMBULATORY_CARE_PROVIDER_SITE_OTHER): Payer: Medicare Other | Admitting: Family Medicine

## 2019-07-24 ENCOUNTER — Other Ambulatory Visit: Payer: Self-pay

## 2019-07-24 VITALS — BP 109/70 | HR 62 | Temp 97.7°F | Ht 64.0 in | Wt 211.0 lb

## 2019-07-24 DIAGNOSIS — J3089 Other allergic rhinitis: Secondary | ICD-10-CM

## 2019-07-24 DIAGNOSIS — E559 Vitamin D deficiency, unspecified: Secondary | ICD-10-CM

## 2019-07-24 DIAGNOSIS — Z6836 Body mass index (BMI) 36.0-36.9, adult: Secondary | ICD-10-CM

## 2019-07-24 DIAGNOSIS — Z9189 Other specified personal risk factors, not elsewhere classified: Secondary | ICD-10-CM

## 2019-07-24 MED ORDER — VITAMIN D (ERGOCALCIFEROL) 1.25 MG (50000 UNIT) PO CAPS: 50000.0000 [IU] | ORAL_CAPSULE | ORAL | 0 refills | Status: DC

## 2019-07-24 NOTE — Progress Notes (Signed)
Chief Complaint:   OBESITY Amanda Hoover is here to discuss her progress with her obesity treatment plan along with follow-up of her obesity related diagnoses. Amanda Hoover is on the Category 2 Plan and the [place] and states she is following her eating plan approximately 60-70% of the time. Amanda Hoover states she is exercising 0 minutes 0 times per week.  Today's visit was #: 7 Starting weight: 233 lbs Starting date: 04/23/2019 Today's weight: 211 lbs Today's date: 07/24/2019 Total lbs lost to date: 22 Total lbs lost since last in-office visit: 1  Interim History: Amanda Hoover states over the last few weeks she felt she struggled to stay on track. Work has been so hectic and she feels she is falling into "bad habits" or habits she had previously of not eating. She hasn't been making herself eat everything on plan. She would like to stick to the Category 2 plan as she felt the other plan had too many options.  Subjective:   Vitamin D deficiency. No nausea, vomiting, or muscle weakness. Amanda Hoover endorses fatigue. She voices she has struggled to stay on the plan. Last Vitamin D 34.8 on 04/23/2019.  Allergic rhinitis due to other allergic trigger, unspecified seasonality. Amanda Hoover voices that she has been taking Xyzal daily. Her symptoms are mostly controlled, but she voices it makes her sleepy.  At risk for osteoporosis. Amanda Hoover is at higher risk of osteopenia and osteoporosis due to Vitamin D deficiency.   Assessment/Plan:   Vitamin D deficiency. Low Vitamin D level contributes to fatigue and are associated with obesity, breast, and colon cancer. She was given a refill on her Vitamin D, Ergocalciferol, (DRISDOL) 1.25 MG (50000 UNIT) CAPS capsule every week #4 with 0 refills and will follow-up for routine testing of Vitamin D, at least 2-3 times per year to avoid over-replacement.    Allergic rhinitis due to other allergic trigger, unspecified seasonality. Will plan to follow-up at her next  appointment.  At risk for osteoporosis. Amanda Hoover was given approximately 15 minutes of osteoporosis prevention counseling today. Amanda Hoover is at risk for osteopenia and osteoporosis due to her Vitamin D deficiency. She was encouraged to take her Vitamin D and follow her higher calcium diet and increase strengthening exercise to help strengthen her bones and decrease her risk of osteopenia and osteoporosis.  Repetitive spaced learning was employed today to elicit superior memory formation and behavioral change.  Class 2 severe obesity with serious comorbidity and body mass index (BMI) of 36.0 to 36.9 in adult, unspecified obesity type (Amanda Hoover).  Amanda Hoover is currently in the action stage of change. As such, her goal is to continue with weight loss efforts. She has agreed to the Category 2 Plan.   Exercise goals: No exercise has been prescribed at this time.  Behavioral modification strategies: increasing lean protein intake, increasing vegetables, meal planning and cooking strategies, keeping healthy foods in the home and planning for success.  Amanda Hoover has agreed to follow-up with our clinic in 2 weeks. She was informed of the importance of frequent follow-up visits to maximize her success with intensive lifestyle modifications for her multiple health conditions.   Objective:   Blood pressure 109/70, pulse 62, temperature 97.7 F (36.5 C), temperature source Oral, height 5\' 4"  (1.626 m), weight 211 lb (95.7 kg), SpO2 98 %. Body mass index is 36.22 kg/m.  General: Cooperative, alert, well developed, in no acute distress. HEENT: Conjunctivae and lids unremarkable. Cardiovascular: Regular rhythm.  Lungs: Normal work of breathing. Neurologic: No  focal deficits.   Lab Results  Component Value Date   CREATININE 0.84 05/29/2019   BUN 17 05/29/2019   NA 138 05/29/2019   K 4.7 05/29/2019   CL 105 05/29/2019   CO2 21 05/29/2019   Lab Results  Component Value Date   ALT 33 05/29/2019   AST 43 (H)  05/29/2019   ALKPHOS 110 05/29/2019   BILITOT 1.0 05/29/2019   Lab Results  Component Value Date   HGBA1C 5.2 04/23/2019   HGBA1C 4.8 06/17/2016   Lab Results  Component Value Date   INSULIN 19.3 04/23/2019   Lab Results  Component Value Date   TSH 1.920 07/05/2019   Lab Results  Component Value Date   CHOL 174 04/23/2019   HDL 58 04/23/2019   LDLCALC 102 (H) 04/23/2019   TRIG 74 04/23/2019   CHOLHDL 4.1 10/31/2018   Lab Results  Component Value Date   WBC 3.8 (L) 05/29/2019   HGB 14.8 05/29/2019   HCT 42.5 05/29/2019   MCV 96.1 05/29/2019   PLT 122.0 (L) 05/29/2019   Lab Results  Component Value Date   IRON 112 01/01/2019   IRON 112 01/01/2019   FERRITIN 88.0 01/01/2019   Attestation Statements:   Reviewed by clinician on day of visit: allergies, medications, problem list, medical history, surgical history, family history, social history, and previous encounter notes.  I, Michaelene Song, am acting as transcriptionist for Coralie Common, MD   I have reviewed the above documentation for accuracy and completeness, and I agree with the above. - Jinny Blossom, MD

## 2019-08-15 ENCOUNTER — Ambulatory Visit (INDEPENDENT_AMBULATORY_CARE_PROVIDER_SITE_OTHER): Payer: Medicare Other | Admitting: Family Medicine

## 2019-08-15 ENCOUNTER — Other Ambulatory Visit: Payer: Self-pay

## 2019-08-15 ENCOUNTER — Encounter (INDEPENDENT_AMBULATORY_CARE_PROVIDER_SITE_OTHER): Payer: Self-pay | Admitting: Family Medicine

## 2019-08-15 ENCOUNTER — Other Ambulatory Visit (INDEPENDENT_AMBULATORY_CARE_PROVIDER_SITE_OTHER): Payer: Self-pay | Admitting: Family Medicine

## 2019-08-15 VITALS — BP 104/50 | HR 61 | Temp 97.9°F | Ht 64.0 in | Wt 209.0 lb

## 2019-08-15 DIAGNOSIS — E038 Other specified hypothyroidism: Secondary | ICD-10-CM | POA: Diagnosis not present

## 2019-08-15 DIAGNOSIS — E8881 Metabolic syndrome: Secondary | ICD-10-CM | POA: Diagnosis not present

## 2019-08-15 DIAGNOSIS — Z6836 Body mass index (BMI) 36.0-36.9, adult: Secondary | ICD-10-CM

## 2019-08-15 DIAGNOSIS — Z9189 Other specified personal risk factors, not elsewhere classified: Secondary | ICD-10-CM

## 2019-08-15 MED ORDER — LEVOTHYROXINE SODIUM 50 MCG PO TABS: 50.0000 ug | ORAL_TABLET | Freq: Every day | ORAL | 0 refills | Status: DC

## 2019-08-15 NOTE — Progress Notes (Signed)
Chief Complaint:   OBESITY Amanda Hoover is here to discuss her progress with her obesity treatment plan along with follow-up of her obesity related diagnoses. Amanda Hoover is on the Category 2 Plan and states she is following her eating plan approximately 60% of the time. Amanda Hoover states she is exercising for 0 minutes 0 times per week.  Today's visit was #: 8 Starting weight: 233 lbs Starting date: 04/23/2019 Today's weight: 209 lbs Today's date: 08/15/2019 Total lbs lost to date: 24 lbs Total lbs lost since last in-office visit: 2 lbs  Interim History: Amanda Hoover says she has been doing quite a bit of stress eating due to work.  She has also had familial stress with her husband's health and her granddaughter's friend having kidney disease.  Subjective:   1. Other specified hypothyroidism Amanda Hoover is taking levothyroxine 50 mcg daily.  No heat/cold intolerance.  No palpitations.  Lab Results  Component Value Date   TSH 1.920 07/05/2019   2. Insulin resistance Amanda Hoover has a diagnosis of insulin resistance based on her elevated fasting insulin level >5. She continues to work on diet and exercise to decrease her risk of diabetes.  Amanda Hoover endorses carb cravings, especially during this stressful time.  Lab Results  Component Value Date   INSULIN 19.3 04/23/2019   Lab Results  Component Value Date   HGBA1C 5.2 04/23/2019   3. At risk for diabetes mellitus Amanda Hoover is at higher than average risk for developing diabetes due to her obesity.   Assessment/Plan:   1. Other specified hypothyroidism Patient with long-standing hypothyroidism, on levothyroxine therapy. She appears euthyroid. Orders and follow up as documented in patient record.  Counseling . Good thyroid control is important for overall health. Supratherapeutic thyroid levels are dangerous and will not improve weight loss results. . The correct way to take levothyroxine is fasting, with water, separated by at least 30 minutes from  breakfast, and separated by more than 4 hours from calcium, iron, multivitamins, acid reflux medications (PPIs).  - levothyroxine (SYNTHROID) 50 MCG tablet; Take 1 tablet (50 mcg total) by mouth daily.  Dispense: 30 tablet; Refill: 0  2. Insulin resistance Amanda Hoover will continue to work on weight loss, exercise, and decreasing simple carbohydrates to help decrease the risk of diabetes. Amanda Hoover agreed to follow-up with [place] as directed to closely monitor her progress.  3. At risk for diabetes mellitus Amanda Hoover is at higher than average risk for developing diabetes due to her obesity.   4. Class 2 severe obesity with serious comorbidity and body mass index (BMI) of 36.0 to 36.9 in adult, unspecified obesity type (HCC) Amanda Hoover is currently in the action stage of change. As such, her goal is to continue with weight loss efforts. She has agreed to the Category 2 Plan.   Exercise goals: No exercise has been prescribed at this time.  Behavioral modification strategies: increasing lean protein intake, meal planning and cooking strategies, keeping healthy foods in the home and emotional eating strategies.  Amanda Hoover has agreed to follow-up with our clinic in 2 weeks with Amanda Marble, NP-C. She was informed of the importance of frequent follow-up visits to maximize her success with intensive lifestyle modifications for her multiple health conditions.   Objective:   Blood pressure (!) 104/50, pulse 61, temperature 97.9 F (36.6 C), temperature source Oral, height 5\' 4"  (1.626 m), weight 209 lb (94.8 kg), SpO2 99 %. Body mass index is 35.87 kg/m.  General: Cooperative, alert, well developed, in no acute distress. HEENT: Conjunctivae  and lids unremarkable. Cardiovascular: Regular rhythm.  Lungs: Normal work of breathing. Neurologic: No focal deficits.   Lab Results  Component Value Date   CREATININE 0.84 05/29/2019   BUN 17 05/29/2019   NA 138 05/29/2019   K 4.7 05/29/2019   CL 105 05/29/2019   CO2 21  05/29/2019   Lab Results  Component Value Date   ALT 33 05/29/2019   AST 43 (H) 05/29/2019   ALKPHOS 110 05/29/2019   BILITOT 1.0 05/29/2019   Lab Results  Component Value Date   HGBA1C 5.2 04/23/2019   HGBA1C 4.8 06/17/2016   Lab Results  Component Value Date   INSULIN 19.3 04/23/2019   Lab Results  Component Value Date   TSH 1.920 07/05/2019   Lab Results  Component Value Date   CHOL 174 04/23/2019   HDL 58 04/23/2019   LDLCALC 102 (H) 04/23/2019   TRIG 74 04/23/2019   CHOLHDL 4.1 10/31/2018   Lab Results  Component Value Date   WBC 3.8 (L) 05/29/2019   HGB 14.8 05/29/2019   HCT 42.5 05/29/2019   MCV 96.1 05/29/2019   PLT 122.0 (L) 05/29/2019   Lab Results  Component Value Date   IRON 112 01/01/2019   IRON 112 01/01/2019   FERRITIN 88.0 01/01/2019   Attestation Statements:   Reviewed by clinician on day of visit: allergies, medications, problem list, medical history, surgical history, family history, social history, and previous encounter notes.  I, Water quality scientist, CMA, am acting as transcriptionist for Coralie Common, MD.  I have reviewed the above documentation for accuracy and completeness, and I agree with the above. - Jinny Blossom, MD

## 2019-08-16 ENCOUNTER — Other Ambulatory Visit (INDEPENDENT_AMBULATORY_CARE_PROVIDER_SITE_OTHER): Payer: Self-pay | Admitting: Family Medicine

## 2019-08-16 DIAGNOSIS — E038 Other specified hypothyroidism: Secondary | ICD-10-CM

## 2019-08-21 ENCOUNTER — Other Ambulatory Visit (INDEPENDENT_AMBULATORY_CARE_PROVIDER_SITE_OTHER): Payer: Self-pay | Admitting: Family Medicine

## 2019-08-21 DIAGNOSIS — E559 Vitamin D deficiency, unspecified: Secondary | ICD-10-CM

## 2019-08-28 DIAGNOSIS — H04123 Dry eye syndrome of bilateral lacrimal glands: Secondary | ICD-10-CM | POA: Diagnosis not present

## 2019-08-28 DIAGNOSIS — H2513 Age-related nuclear cataract, bilateral: Secondary | ICD-10-CM | POA: Diagnosis not present

## 2019-08-28 DIAGNOSIS — H25013 Cortical age-related cataract, bilateral: Secondary | ICD-10-CM | POA: Diagnosis not present

## 2019-08-28 DIAGNOSIS — H35363 Drusen (degenerative) of macula, bilateral: Secondary | ICD-10-CM | POA: Diagnosis not present

## 2019-08-29 ENCOUNTER — Ambulatory Visit (INDEPENDENT_AMBULATORY_CARE_PROVIDER_SITE_OTHER): Payer: Medicare Other | Admitting: Adult Health

## 2019-09-12 ENCOUNTER — Other Ambulatory Visit (INDEPENDENT_AMBULATORY_CARE_PROVIDER_SITE_OTHER): Payer: Self-pay | Admitting: Family Medicine

## 2019-09-12 ENCOUNTER — Encounter (INDEPENDENT_AMBULATORY_CARE_PROVIDER_SITE_OTHER): Payer: Self-pay

## 2019-09-12 DIAGNOSIS — E038 Other specified hypothyroidism: Secondary | ICD-10-CM

## 2019-09-13 ENCOUNTER — Other Ambulatory Visit (INDEPENDENT_AMBULATORY_CARE_PROVIDER_SITE_OTHER): Payer: Self-pay

## 2019-09-13 ENCOUNTER — Other Ambulatory Visit (INDEPENDENT_AMBULATORY_CARE_PROVIDER_SITE_OTHER): Payer: Self-pay | Admitting: Family Medicine

## 2019-09-13 DIAGNOSIS — E038 Other specified hypothyroidism: Secondary | ICD-10-CM

## 2019-09-13 MED ORDER — LEVOTHYROXINE SODIUM 50 MCG PO TABS: 50.0000 ug | ORAL_TABLET | Freq: Every day | ORAL | 0 refills | Status: DC

## 2019-09-28 ENCOUNTER — Ambulatory Visit (INDEPENDENT_AMBULATORY_CARE_PROVIDER_SITE_OTHER): Payer: Medicare Other | Admitting: Family Medicine

## 2019-09-28 ENCOUNTER — Other Ambulatory Visit: Payer: Self-pay

## 2019-09-28 VITALS — BP 120/70 | HR 78 | Temp 98.1°F | Ht 64.0 in | Wt 216.0 lb

## 2019-09-28 DIAGNOSIS — E038 Other specified hypothyroidism: Secondary | ICD-10-CM

## 2019-09-28 DIAGNOSIS — R229 Localized swelling, mass and lump, unspecified: Secondary | ICD-10-CM | POA: Diagnosis not present

## 2019-09-28 LAB — TSH: TSH: 4.43 mIU/L (ref 0.40–4.50)

## 2019-09-28 MED ORDER — PREDNISONE 20 MG PO TABS: ORAL_TABLET | ORAL | 0 refills | Status: DC

## 2019-09-28 NOTE — Progress Notes (Signed)
Subjective:    Patient ID: Amanda Hoover, female    DOB: Jun 19, 1955, 64 y.o.   MRN: [id number]  HPI Patient presents today with painless swelling over her thyroid gland and the skin overlying her collarbones bilaterally.  She states that the skin there has felt thick and slightly itchy over the last 3 to 4 days.  She took some Benadryl last night and it helped with the swelling.  The thyroid gland is not enlarged.  There is no tenderness to palpation.  There is no visible rash however there is some puffiness and edema in the subcutaneous tissue over the collarbones and lower neck. Past Medical History:  Diagnosis Date  . Acid reflux   . Allergy   . Anxiety   . Bilateral swelling of feet   . 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  . Breast cancer, right ([place])    ER 0 PR0 LN neg.  . Cancer ([place])   . Chronic headaches   . Chronic ITP (idiopathic thrombocytopenia) (HCC)    mild thrombocytopenia, normal spleen on [place] 11/18, mild cirrhosis  . Cirrhosis ([place])   . Dental crowns present   . Depression    no current meds.  . Depression   . Fatty liver   . GERD (gastroesophageal reflux disease)   . Headache(784.0)    stress, sinus headaches  . Heart murmur   . Hiatal hernia   . Hiatal hernia   . History of colon polyps   . History of esophageal stricture    has had esophageal dilation x 1  . History of migraine   . History of radiation therapy 03/28/2012-05/17/2012   60.4 gray to right breast  . Hyperlipidemia   . Hypothyroid   . Multiple environmental allergies   . Multiple food allergies   . Osteopenia   . Osteopenia   . Osteoporosis   . Palpitations    occasional; worse with ingestion of caffeine; no cardiologist  . Personal history of radiation therapy 2013   Right Breast Cancer  . Rash 01/06/2012   right elbow  . Rhinitis, allergic    rhinitis  . Thyroid disease    Past Surgical History:  Procedure Laterality Date  .  BREAST LUMPECTOMY Right 2013  . COLONOSCOPY    . COLONOSCOPY W/ POLYPECTOMY    . ESOPHAGOGASTRODUODENOSCOPY    . MASTECTOMY Right 2013   partial mast  . PARTIAL MASTECTOMY WITH NEEDLE LOCALIZATION AND AXILLARY SENTINEL LYMPH NODE BX  01/12/2012   Procedure: PARTIAL MASTECTOMY WITH NEEDLE LOCALIZATION AND AXILLARY SENTINEL LYMPH NODE BX;  Surgeon: Adin Hector, MD;  Location: [facility];  Service: General;  Laterality: Right;  . RE-EXCISION OF BREAST CANCER,SUPERIOR MARGINS  02/15/2012   Procedure: RE-EXCISION OF BREAST CANCER,SUPERIOR MARGINS;  Surgeon: Adin Hector, MD;  Location: [facility];  Service: General;  Laterality: Right;  Re-excision of right lumpectomy, close posterior margin.   Current Outpatient Medications on File Prior to Visit  Medication Sig Dispense Refill  . levocetirizine (XYZAL) 5 MG tablet Take 5 mg by mouth every evening.    Marland Kitchen levothyroxine (SYNTHROID) 50 MCG tablet Take 1 tablet (50 mcg total) by mouth daily. 30 tablet 0  . Methylcobalamin (B12-ACTIVE PO) Take 1 tablet by mouth.    Marland Kitchen MILK THISTLE PO Take by mouth.    . Multiple Vitamin (MULTIVITAMIN) tablet Take 1 tablet by mouth daily.    . pantoprazole (PROTONIX) 40 MG tablet  Take 1 (40 mg) by mouth daily. 90 tablet 3  . Vitamin D, Ergocalciferol, (DRISDOL) 1.25 MG (50000 UNIT) CAPS capsule Take 1 capsule (50,000 Units total) by mouth every 7 (seven) days. 4 capsule 0   No current facility-administered medications on file prior to visit.   Allergies  Allergen Reactions  . Macadamia Nut Oil Anaphylaxis  . Other Swelling    POPCORN:  SWELLING THROAT  . Peanut-Containing Drug Products Swelling and Other (See Comments)    THROAT TIGHTNESS; ALSO WALNUTS  . Tomato Swelling    SWELLING UVULA  . Adhesive [Tape] Other (See Comments)    BLISTERS  . Flour Other (See Comments)    SNEEZING  . Penicillins Hives  . Hydrocodone Hives  . Oxycodone   . Sulfa Antibiotics Nausea Only    . Vicodin [Hydrocodone-Acetaminophen] Swelling    Pt had flushing, then itching and swelling of lips  . Eggs Or Egg-Derived Products Other (See Comments)    POSITIVE ON ALLERGY TEST, BUT EATS EGGS WITHOUT PROBLEM  . Soap Other (See Comments)    FRAGRANCES (SOAP/LOTION/PERFUME):  HEADACHE, RUNNY NOSE  . Tramadol Nausea And Vomiting   Social History   Socioeconomic History  . Marital status: Married    Spouse name: Not on file  . Number of children: 4  . Years of education: Not on file  . Highest education level: Not on file  Occupational History    Employer: [employer]    Comment: Teacher, English as a foreign language for [employer]  Tobacco Use  . Smoking status: Former Smoker    Packs/day: 0.50    Years: 8.00    Pack years: 4.00    Types: Cigarettes    Quit date: 2006    Years since quitting: 15.5  . Smokeless tobacco: Never Used  Vaping Use  . Vaping Use: Never used  Substance and Sexual Activity  . Alcohol use: No  . Drug use: No    Comment: quit smoking 11 years ago  . Sexual activity: Yes    Birth control/protection: Post-menopausal  Other Topics Concern  . Not on file  Social History Narrative  . Not on file   Social Determinants of Health   Financial Resource Strain:   . Difficulty of Paying Living Expenses:   Food Insecurity:   . Worried About Charity fundraiser in the Last Year:   . Arboriculturist in the Last Year:   Transportation Needs:   . Film/video editor (Medical):   Marland Kitchen Lack of Transportation (Non-Medical):   Physical Activity:   . Days of Exercise per Week:   . Minutes of Exercise per Session:   Stress:   . Feeling of Stress :   Social Connections:   . Frequency of Communication with Friends and Family:   . Frequency of Social Gatherings with Friends and Family:   . Attends Religious Services:   . Active Member of Clubs or Organizations:   . Attends Archivist Meetings:   Marland Kitchen Marital Status:   Intimate Partner Violence:   . Fear of Current or  Ex-Partner:   . Emotionally Abused:   Marland Kitchen Physically Abused:   . Sexually Abused:       Review of Systems  All other systems reviewed and are negative.      Objective:   Physical Exam Vitals reviewed.  Constitutional:      General: She is not in acute distress.    Appearance: She is well-developed. She is not diaphoretic.  HENT:     Right Ear: Tympanic membrane, ear canal and external ear normal.     Left Ear: Tympanic membrane, ear canal and external ear normal.     Nose: No mucosal edema, congestion or rhinorrhea.     Right Sinus: No maxillary sinus tenderness or frontal sinus tenderness.     Left Sinus: No maxillary sinus tenderness or frontal sinus tenderness.     Mouth/Throat:     Pharynx: No oropharyngeal exudate.  Eyes:     Conjunctiva/sclera: Conjunctivae normal.  Neck:     Thyroid: No thyroid mass, thyromegaly or thyroid tenderness.   Cardiovascular:     Rate and Rhythm: Normal rate and regular rhythm.     Heart sounds: Normal heart sounds. No murmur heard.  No friction rub. No gallop.   Pulmonary:     Effort: Pulmonary effort is normal. No respiratory distress.     Breath sounds: Normal breath sounds. No stridor. No wheezing or rales.  Musculoskeletal:     Cervical back: Full passive range of motion without pain, normal range of motion and neck supple. Edema present. No erythema. No pain with movement or muscular tenderness. Normal range of motion.  Lymphadenopathy:     Cervical: No cervical adenopathy.           Assessment & Plan:  Other specified hypothyroidism - Plan: TSH  Swelling of skin  Over the last 3 to 4 days, the patient has had superficial swelling of the skin in the area diagrammed above.  I believe this is a localized allergic reaction likely due to some type of plant dermatitis however there is no visible rash other than some eczema-like skin changes to the skin.  She states that is improved dramatically this morning due to some Benadryl  she took last night.  She denies any trouble breathing.  There is no tongue swelling.  There is no lip swelling.  There is no stridor.  There is no visible hives.  I believe this is a topical mild allergic reaction.  I will treat the patient with the Benadryl over-the-counter as well as a prednisone taper pack.  Given the location and the swelling the patient reports I will also check a TSH to evaluate for possible thyroiditis.

## 2019-10-05 ENCOUNTER — Telehealth: Payer: Self-pay

## 2019-10-05 NOTE — Telephone Encounter (Signed)
I gave her prednisone for an allergic reaction.  Not sure what the cause is. I want to see her Monday.

## 2019-10-05 NOTE — Telephone Encounter (Signed)
She's schedule for Tuesday the 27 @ 2pm

## 2019-10-05 NOTE — Telephone Encounter (Signed)
Amanda Hoover still has swollen lymph nodes, she's done with the prednisone. She went to the dentist this past Wednesday 10/03/19 and the dentist informed her she had blisters on the back of her throat. So she's wondering if she needs to be on another antibiotic? She's not feeling good, but has no pain.

## 2019-10-09 ENCOUNTER — Ambulatory Visit (INDEPENDENT_AMBULATORY_CARE_PROVIDER_SITE_OTHER): Payer: Medicare Other | Admitting: Family Medicine

## 2019-10-09 ENCOUNTER — Other Ambulatory Visit: Payer: Self-pay

## 2019-10-09 VITALS — BP 120/58 | HR 72 | Temp 98.8°F | Ht 64.0 in | Wt 214.0 lb

## 2019-10-09 DIAGNOSIS — S91319A Laceration without foreign body, unspecified foot, initial encounter: Secondary | ICD-10-CM | POA: Diagnosis not present

## 2019-10-09 DIAGNOSIS — Z23 Encounter for immunization: Secondary | ICD-10-CM | POA: Diagnosis not present

## 2019-10-09 DIAGNOSIS — R590 Localized enlarged lymph nodes: Secondary | ICD-10-CM | POA: Diagnosis not present

## 2019-10-09 NOTE — Addendum Note (Signed)
Addended by: Saundra Shelling on: 10/09/2019 05:16 PM   Modules accepted: Orders

## 2019-10-09 NOTE — Progress Notes (Signed)
Subjective:    Patient ID: Amanda Hoover, female    DOB: 10-20-1955, 64 y.o.   MRN: [id number]  HPI  Patient has a history of breast cancer diagnosed originally in 2013.  She is concerned because she has developed swelling in her lymph nodes.  The left and right submandibular lymph nodes are tender and slightly swollen.  They are roughly 2 cm in diameter.  They are not fluctuant.  There is no overlying erythema.  She did recently have a virus with blisters on the roof of her mouth diagnosed by her dentist when she saw them.  She is concerned because the lymph nodes are still swollen and tender almost 2 weeks after having the virus.  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   Breast cancer, right ([place])    ER 0 PR0 LN neg.   Cancer [facility])    Chronic headaches    Chronic ITP (idiopathic thrombocytopenia) (HCC)    mild thrombocytopenia, normal spleen on [place] 11/18, mild cirrhosis   Cirrhosis ([place])    Dental crowns present    Depression    no current meds.   Depression    Fatty liver    GERD (gastroesophageal reflux disease)    Headache(784.0)    stress, sinus headaches   Heart murmur    Hiatal hernia    Hiatal hernia    History of colon polyps    History of esophageal stricture    has had esophageal dilation x 1   History of migraine    History of radiation therapy 03/28/2012-05/17/2012   60.4 gray to right breast   Hyperlipidemia    Hypothyroid    Multiple environmental allergies    Multiple food allergies    Osteopenia    Osteopenia    Osteoporosis    Palpitations    occasional; worse with ingestion of caffeine; no cardiologist   Personal history of radiation therapy 2013   Right Breast Cancer   Rash 01/06/2012   right elbow   Rhinitis, allergic    rhinitis   Thyroid disease    Past  Surgical History:  Procedure Laterality Date   BREAST LUMPECTOMY Right 2013   COLONOSCOPY     COLONOSCOPY W/ POLYPECTOMY     ESOPHAGOGASTRODUODENOSCOPY     MASTECTOMY Right 2013   partial mast   PARTIAL MASTECTOMY WITH NEEDLE LOCALIZATION AND AXILLARY SENTINEL LYMPH NODE BX  01/12/2012   Procedure: PARTIAL MASTECTOMY WITH NEEDLE LOCALIZATION AND AXILLARY SENTINEL LYMPH NODE BX;  Surgeon: Adin Hector, MD;  Location: [place];  Service: General;  Laterality: Right;   RE-EXCISION OF BREAST CANCER,SUPERIOR MARGINS  02/15/2012   Procedure: RE-EXCISION OF BREAST CANCER,SUPERIOR MARGINS;  Surgeon: Adin Hector, MD;  Location: [facility];  Service: General;  Laterality: Right;  Re-excision of right lumpectomy, close posterior margin.   Current Outpatient Medications on File Prior to Visit  Medication Sig Dispense Refill   levocetirizine (XYZAL) 5 MG tablet Take 5 mg by mouth every evening.      levothyroxine (SYNTHROID) 50 MCG tablet Take 1 tablet (50 mcg total) by mouth daily. 30 tablet 0   Methylcobalamin (B12-ACTIVE PO) Take 1 tablet by mouth.     MILK THISTLE PO Take by mouth.      Multiple Vitamin (MULTIVITAMIN)  tablet Take 1 tablet by mouth daily.     pantoprazole (PROTONIX) 40 MG tablet Take 1 (40 mg) by mouth daily. 90 tablet 3   Vitamin D, Ergocalciferol, (DRISDOL) 1.25 MG (50000 UNIT) CAPS capsule Take 1 capsule (50,000 Units total) by mouth every 7 (seven) days. 4 capsule 0   No current facility-administered medications on file prior to visit.   Allergies  Allergen Reactions   Macadamia Nut Oil Anaphylaxis   Other Swelling    POPCORN:  SWELLING THROAT   Peanut-Containing Drug Products Swelling and Other (See Comments)    THROAT TIGHTNESS; ALSO WALNUTS   Tomato Swelling    SWELLING UVULA   Adhesive [Tape] Other (See Comments)    BLISTERS   Flour Other (See Comments)    SNEEZING   Penicillins Hives   Hydrocodone  Hives   Oxycodone    Sulfa Antibiotics Nausea Only   Vicodin [Hydrocodone-Acetaminophen] Swelling    Pt had flushing, then itching and swelling of lips   Eggs Or Egg-Derived Products Other (See Comments)    POSITIVE ON ALLERGY TEST, BUT EATS EGGS WITHOUT PROBLEM   Soap Other (See Comments)    FRAGRANCES (SOAP/LOTION/PERFUME):  HEADACHE, RUNNY NOSE   Tramadol Nausea And Vomiting   Social History   Socioeconomic History   Marital status: Married    Spouse name: Not on file   Number of children: 4   Years of education: Not on file   Highest education level: Not on file  Occupational History    Employer: [employer]    Comment: Teacher, English as a foreign language for [employer]  Tobacco Use   Smoking status: Former Smoker    Packs/day: 0.50    Years: 8.00    Pack years: 4.00    Types: Cigarettes    Quit date: 2006    Years since quitting: 15.5   Smokeless tobacco: Never Used  Scientific laboratory technician Use: Never used  Substance and Sexual Activity   Alcohol use: No   Drug use: No    Comment: quit smoking 11 years ago   Sexual activity: Yes    Birth control/protection: Post-menopausal  Other Topics Concern   Not on file  Social History Narrative   Not on file   Social Determinants of Health   Financial Resource Strain:    Difficulty of Paying Living Expenses:   Food Insecurity:    Worried About Charity fundraiser in the Last Year:    Arboriculturist in the Last Year:   Transportation Needs:    Film/video editor (Medical):    Lack of Transportation (Non-Medical):   Physical Activity:    Days of Exercise per Week:    Minutes of Exercise per Session:   Stress:    Feeling of Stress :   Social Connections:    Frequency of Communication with Friends and Family:    Frequency of Social Gatherings with Friends and Family:    Attends Religious Services:    Active Member of Clubs or Organizations:    Attends Music therapist:    Marital  Status:   Intimate Partner Violence:    Fear of Current or Ex-Partner:    Emotionally Abused:    Physically Abused:    Sexually Abused:       Review of Systems  All other systems reviewed and are negative.      Objective:   Physical Exam Vitals reviewed.  Constitutional:      General: She is  not in acute distress.    Appearance: She is well-developed. She is not diaphoretic.  HENT:     Right Ear: Tympanic membrane, ear canal and external ear normal.     Left Ear: Tympanic membrane, ear canal and external ear normal.     Nose: No mucosal edema, congestion or rhinorrhea.     Right Sinus: No maxillary sinus tenderness or frontal sinus tenderness.     Left Sinus: No maxillary sinus tenderness or frontal sinus tenderness.     Mouth/Throat:     Pharynx: No oropharyngeal exudate.  Eyes:     Conjunctiva/sclera: Conjunctivae normal.  Neck:     Thyroid: No thyroid mass, thyromegaly or thyroid tenderness.   Cardiovascular:     Rate and Rhythm: Normal rate and regular rhythm.     Heart sounds: Normal heart sounds. No murmur heard.  No friction rub. No gallop.   Pulmonary:     Effort: Pulmonary effort is normal. No respiratory distress.     Breath sounds: Normal breath sounds. No stridor. No wheezing or rales.  Musculoskeletal:     Cervical back: Full passive range of motion without pain, normal range of motion and neck supple. No edema or erythema. No pain with movement or muscular tenderness. Normal range of motion.  Lymphadenopathy:     Head:     Right side of head: Submandibular adenopathy present.     Left side of head: Submandibular adenopathy present.     Cervical: No cervical adenopathy.           Assessment & Plan:  Lymphadenopathy of head and neck region - Plan: US Soft Tissue Head/Neck (NON-THYROID)  Cut of foot  I believe these are most likely reactive lymph nodes due to the recent viral infection.  I tried to reassure the patient.  However I can  understand her concern given her history of cancer.  Therefore I will gladly arrange an ultrasound of the neck to evaluate the size and shape of the lymph nodes.  I believe they are most likely benign reactive lymph nodes and should gradually shrink over time.  Patient however would be comfortable getting an ultrasound for peace of mind.  If there are abnormal characteristics on ultrasound, we could certainly arrange a fine-needle biopsy.  As I was leaving the room, the patient mentions that she recently cut the posterior aspect of her right heel on her husband's truck door.  She states that the truck is old and has jagged rusty panels.  The rusty panel lacerated the corner of her heel.  The wound does not appear infected.  Is approximately 2 cm long and appears to be healing.  However she states his been more than 10 years since she had a tetanus shot.  She would like to get a booster on her tetanus shot today

## 2019-10-09 NOTE — Addendum Note (Signed)
Addended by: Saundra Shelling on: 10/09/2019 05:12 PM   Modules accepted: Orders

## 2019-10-17 ENCOUNTER — Other Ambulatory Visit: Payer: Self-pay

## 2019-10-17 ENCOUNTER — Telehealth: Payer: Self-pay | Admitting: Family Medicine

## 2019-10-17 ENCOUNTER — Ambulatory Visit (INDEPENDENT_AMBULATORY_CARE_PROVIDER_SITE_OTHER): Payer: Medicare Other | Admitting: Nurse Practitioner

## 2019-10-17 VITALS — BP 120/74 | HR 60 | Temp 97.7°F | Resp 18 | Wt 217.4 lb

## 2019-10-17 DIAGNOSIS — J029 Acute pharyngitis, unspecified: Secondary | ICD-10-CM | POA: Diagnosis not present

## 2019-10-17 DIAGNOSIS — J019 Acute sinusitis, unspecified: Secondary | ICD-10-CM

## 2019-10-17 MED ORDER — AZITHROMYCIN 250 MG PO TABS: ORAL_TABLET | ORAL | 0 refills | Status: DC

## 2019-10-17 NOTE — Progress Notes (Signed)
New Patient Office Visit  Subjective:  Patient ID: Amanda Hoover, female    DOB: 1955/06/01  Age: 64 y.o. MRN: [id number]  CC:  Chief Complaint  Patient presents with  . Sore Throat    sinus issue, ears, x3 weeks, lymph nodes swelling and drainage, antihist-decongestion meds taken, chloraseptic spray, and gargling with salt water    HPI Amanda Hoover is a 64 year old female presenting to the clinic with c/o sinus pressure, nasal drainage that she has had for about three weeks. She has felt generally fatigued. For one week she has had a sore throat. She has sought treatment with her PCP and completed the treatment pending ultrasound of her neck for swelling possibly r/t swollen lymph node draining which today has improved a bit. She has used salt warm water gargles that soothes her throat and occasional tylenol. No others with similar sxs. She does work in the public setting wearing a cloth mask and fully vaccinated for [place]. Discussed Covid vaccination against sxs not contracting and spreading. No cp, ct, gu, gi sxs, pain, sob, edema, loss of taste/smell, loss or decreased appetite.  Treatment plan discussed and pt desires, agrees.   Past Medical History:  Diagnosis Date  . Acid reflux   . Allergy   . Anxiety   . Bilateral swelling of feet   . 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  . Breast cancer, right ([facility])    ER 0 PR0 LN neg.  . Cancer ([facility])   . Chronic headaches   . Chronic ITP (idiopathic thrombocytopenia) (HCC)    mild thrombocytopenia, normal spleen on [place] 11/18, mild cirrhosis  . Cirrhosis ([facility])   . Dental crowns present   . Depression    no current meds.  . Depression   . Fatty liver   . GERD (gastroesophageal reflux disease)   . Headache(784.0)    stress, sinus headaches  . Heart murmur   . Hiatal hernia   . Hiatal hernia   . History of colon polyps   . History of esophageal stricture    has had esophageal  dilation x 1  . History of migraine   . History of radiation therapy 03/28/2012-05/17/2012   60.4 gray to right breast  . Hyperlipidemia   . Hypothyroid   . Multiple environmental allergies   . Multiple food allergies   . Osteopenia   . Osteopenia   . Osteoporosis   . Palpitations    occasional; worse with ingestion of caffeine; no cardiologist  . Personal history of radiation therapy 2013   Right Breast Cancer  . Rash 01/06/2012   right elbow  . Rhinitis, allergic    rhinitis  . Thyroid disease     Past Surgical History:  Procedure Laterality Date  . BREAST LUMPECTOMY Right 2013  . COLONOSCOPY    . COLONOSCOPY W/ POLYPECTOMY    . ESOPHAGOGASTRODUODENOSCOPY    . MASTECTOMY Right 2013   partial mast  . PARTIAL MASTECTOMY WITH NEEDLE LOCALIZATION AND AXILLARY SENTINEL LYMPH NODE BX  01/12/2012   Procedure: PARTIAL MASTECTOMY WITH NEEDLE LOCALIZATION AND AXILLARY SENTINEL LYMPH NODE BX;  Surgeon: Adin Hector, MD;  Location: [facility];  Service: General;  Laterality: Right;  . RE-EXCISION OF BREAST CANCER,SUPERIOR MARGINS  02/15/2012   Procedure: RE-EXCISION OF BREAST CANCER,SUPERIOR MARGINS;  Surgeon: Adin Hector, MD;  Location: [facility];  Service: General;  Laterality: Right;  Re-excision of right lumpectomy,  close posterior margin.    Family History  Problem Relation Age of Onset  . Crohn's disease Mother   . Irritable bowel syndrome Mother   . Hypertension Mother   . Hyperlipidemia Mother   . Stroke Mother   . Thyroid disease Mother   . Depression Mother   . Esophageal cancer Father 33  . Cancer Father   . Breast cancer Paternal Aunt        dx in her early 52s  . Diabetes Maternal Grandmother   . Breast cancer Paternal Grandmother 74  . Diabetes Paternal Grandmother   . Cancer Maternal Uncle        unknown form of cancer  . Melanoma Cousin        maternal cousin dx in her 49s  . Breast cancer Other   . Liver disease Other    . Colon cancer Neg Hx   . Rectal cancer Neg Hx   . Stomach cancer Neg Hx     Social History   Socioeconomic History  . Marital status: Married    Spouse name: Not on file  . Number of children: 4  . Years of education: Not on file  . Highest education level: Not on file  Occupational History    Employer: [employer]    Comment: Teacher, English as a foreign language for [employer]  Tobacco Use  . Smoking status: Former Smoker    Packs/day: 0.50    Years: 8.00    Pack years: 4.00    Types: Cigarettes    Quit date: 2006    Years since quitting: 15.6  . Smokeless tobacco: Never Used  Vaping Use  . Vaping Use: Never used  Substance and Sexual Activity  . Alcohol use: No  . Drug use: No    Comment: quit smoking 11 years ago  . Sexual activity: Yes    Birth control/protection: Post-menopausal  Other Topics Concern  . Not on file  Social History Narrative  . Not on file   Social Determinants of Health   Financial Resource Strain:   . Difficulty of Paying Living Expenses:   Food Insecurity:   . Worried About Charity fundraiser in the Last Year:   . Arboriculturist in the Last Year:   Transportation Needs:   . Film/video editor (Medical):   Marland Kitchen Lack of Transportation (Non-Medical):   Physical Activity:   . Days of Exercise per Week:   . Minutes of Exercise per Session:   Stress:   . Feeling of Stress :   Social Connections:   . Frequency of Communication with Friends and Family:   . Frequency of Social Gatherings with Friends and Family:   . Attends Religious Services:   . Active Member of Clubs or Organizations:   . Attends Archivist Meetings:   Marland Kitchen Marital Status:   Intimate Partner Violence:   . Fear of Current or Ex-Partner:   . Emotionally Abused:   Marland Kitchen Physically Abused:   . Sexually Abused:     ROS Review of Systems  All other systems reviewed and are negative.   Objective:   Today's Vitals: BP 120/74 (BP Location: Left Arm, Patient Position: Sitting,  Cuff Size: Large)   Pulse 60   Temp 97.7 F (36.5 C) (Temporal)   Resp 18   Wt 217 lb 6.4 oz (98.6 kg)   SpO2 97%   BMI 37.32 kg/m   Physical Exam Vitals and nursing note reviewed.  Constitutional:  General: She is not in acute distress.    Appearance: Normal appearance. She is well-developed and well-groomed. She is not ill-appearing, toxic-appearing or diaphoretic.  HENT:     Head: Normocephalic.     Right Ear: Ear canal normal. No tenderness.     Left Ear: Ear canal normal. No tenderness.     Nose: Rhinorrhea present. No congestion.     Right Sinus: Maxillary sinus tenderness present.     Left Sinus: Maxillary sinus tenderness present.     Mouth/Throat:     Mouth: Mucous membranes are moist. No oral lesions.     Tongue: No lesions.     Pharynx: Uvula midline. Posterior oropharyngeal erythema present.     Tonsils: No tonsillar exudate or tonsillar abscesses.      Comments: Scattered small erythremic bumps Eyes:     Extraocular Movements: Extraocular movements intact.     Conjunctiva/sclera: Conjunctivae normal.     Pupils: Pupils are equal, round, and reactive to light.  Neck:     Vascular: No carotid bruit.  Cardiovascular:     Rate and Rhythm: Normal rate and regular rhythm.     Heart sounds: Normal heart sounds.  Pulmonary:     Effort: Pulmonary effort is normal.     Breath sounds: Normal breath sounds.  Abdominal:     General: Bowel sounds are normal.     Palpations: Abdomen is soft.  Musculoskeletal:        General: Normal range of motion.     Cervical back: Normal range of motion and neck supple. No rigidity or tenderness.     Right lower leg: No edema.     Left lower leg: No edema.  Lymphadenopathy:     Head:     Right side of head: Tonsillar adenopathy present. No submental or submandibular adenopathy.     Left side of head: Tonsillar adenopathy present. No submental or submandibular adenopathy.     Cervical: No cervical adenopathy.  Skin:     General: Skin is warm and dry.     Capillary Refill: Capillary refill takes less than 2 seconds.     Coloration: Skin is not jaundiced or pale.     Findings: No rash.  Neurological:     General: No focal deficit present.     Mental Status: She is alert and oriented to person, place, and time.  Psychiatric:        Mood and Affect: Mood normal.        Behavior: Behavior normal. Behavior is cooperative.        Thought Content: Thought content normal.        Judgment: Judgment normal.     Assessment & Plan:   Problem List Items Addressed This Visit    None    Visit Diagnoses    Pharyngitis, unspecified etiology    -  Primary   Relevant Orders   STREP GROUP A AG, W/REFLEX TO CULT (Completed)   SARS-COV-2 RNA,(COVID-19) QUAL NAAT   Sore throat       Relevant Orders   STREP GROUP A AG, W/REFLEX TO CULT (Completed)   SARS-COV-2 RNA,(COVID-19) QUAL NAAT   Acute sinusitis, recurrence not specified, unspecified location       Relevant Medications   azithromycin (ZITHROMAX) 250 MG tablet    Get plenty of rest  Drink plenty of water  May take over the counter medications to relieve your symptoms  Take prescribed medications as directed  Strep throat test was negative  Your symptoms are consistent with a viral infection however you also have symptoms of a secondary bacterial sinus infections that will be treated with antibiotic and you should take daily antihistamine such as Claritin or Xyzal and nasal spray Flonase.   Covid testing was completed today r/t 3 weeks of sxs that have gradually worsened with new week symptom of sore throat. You should quarantine until sxs resolve.  Salt warm water gargles 1 cup water to 1 tsp salt may help to soothe your sore throat.   Outpatient Encounter Medications as of 10/17/2019  Medication Sig  . levocetirizine (XYZAL) 5 MG tablet Take 5 mg by mouth every evening.   Marland Kitchen levothyroxine (SYNTHROID) 50 MCG tablet Take 1 tablet (50 mcg total) by mouth  daily.  . Methylcobalamin (B12-ACTIVE PO) Take 1 tablet by mouth.  Marland Kitchen MILK THISTLE PO Take by mouth.   . Multiple Vitamin (MULTIVITAMIN) tablet Take 1 tablet by mouth daily.  . pantoprazole (PROTONIX) 40 MG tablet Take 1 (40 mg) by mouth daily.  Marland Kitchen azithromycin (ZITHROMAX) 250 MG tablet Day one take 2 tablets day two through five take one tablet then stop  . Vitamin D, Ergocalciferol, (DRISDOL) 1.25 MG (50000 UNIT) CAPS capsule Take 1 capsule (50,000 Units total) by mouth every 7 (seven) days. (Patient not taking: Reported on 10/17/2019)   No facility-administered encounter medications on file as of 10/17/2019.    Follow-up: Return if symptoms worsen or fail to improve.   Annie Main, FNP

## 2019-10-17 NOTE — Telephone Encounter (Signed)
Just schedule appt. For Pt with the NP 10/17/19

## 2019-10-17 NOTE — Telephone Encounter (Signed)
CB# [phone] Throat still sore some redness can Dr.Pickard prescribe medication.

## 2019-10-17 NOTE — Patient Instructions (Signed)
Get plenty of rest  Drink plenty of water  May take over the counter medications to relieve your symptoms  Take prescribed medications as directed  Strep throat test was negative  Your symptoms are consistent with a viral infection however you also have symptoms of a secondary bacterial sinus infections that will be treated with antibiotic and you should take daily antihistamine such as Claritin or Xyzal and nasal spray Flonase.   Covid testing was completed today r/t 3 weeks of sxs that have gradually worsened with new week symptom of sore throat. You should quarantine until sxs resolve.  Salt warm water gargles 1 cup water to 1 tsp salt may help to soothe your sore throat.

## 2019-10-18 LAB — SARS-COV-2 RNA,(COVID-19) QUALITATIVE NAAT: SARS CoV2 RNA: NOT DETECTED

## 2019-10-19 ENCOUNTER — Other Ambulatory Visit: Payer: Medicare Other

## 2019-10-19 LAB — CULTURE, GROUP A STREP
MICRO NUMBER:: 10786490
SPECIMEN QUALITY:: ADEQUATE

## 2019-10-19 LAB — STREP GROUP A AG, W/REFLEX TO CULT: Streptococcus, Group A Screen (Direct): NOT DETECTED

## 2019-10-22 ENCOUNTER — Telehealth: Payer: Self-pay | Admitting: *Deleted

## 2019-10-22 ENCOUNTER — Other Ambulatory Visit: Payer: Self-pay | Admitting: Family Medicine

## 2019-10-22 DIAGNOSIS — E038 Other specified hypothyroidism: Secondary | ICD-10-CM

## 2019-10-22 MED ORDER — LEVOTHYROXINE SODIUM 50 MCG PO TABS: 50.0000 ug | ORAL_TABLET | Freq: Every day | ORAL | 3 refills | Status: DC

## 2019-10-22 MED ORDER — PANTOPRAZOLE SODIUM 40 MG PO TBEC: DELAYED_RELEASE_TABLET | ORAL | 3 refills | Status: DC

## 2019-10-22 NOTE — Telephone Encounter (Signed)
Left message to return call to notify pt.  

## 2019-10-22 NOTE — Telephone Encounter (Signed)
Pt calling to get refill on levothyroxine and pantoprazole. The levothyroxine is prescribed by specialist Dr. Adair Patter. Pt states she is not going back to see him and wonders if dr pickard would take over. States he did her last bw for thyroid. Last visit with dr Dennard Schaumann 10/03/19 [facility] [address] in [place]

## 2019-10-22 NOTE — Telephone Encounter (Signed)
done

## 2019-10-23 ENCOUNTER — Other Ambulatory Visit (INDEPENDENT_AMBULATORY_CARE_PROVIDER_SITE_OTHER): Payer: Self-pay | Admitting: Family Medicine

## 2019-10-23 ENCOUNTER — Telehealth: Payer: Self-pay | Admitting: Gastroenterology

## 2019-10-23 DIAGNOSIS — E038 Other specified hypothyroidism: Secondary | ICD-10-CM

## 2019-10-23 NOTE — Telephone Encounter (Signed)
Lm on vm for patient to return call 

## 2019-10-23 NOTE — Telephone Encounter (Signed)
Spoke with patient, per result note from 05/29/19 I advised patient that she will be due for labs around 11/29/19, advsied that orders are in and she will just need to go by the lab between 7:30-5, no appt necessary. Pt wanted to discuss polyps and use of Protonix, pt advised that she is due for repeat EGD in 2 years. Pt scheduled for follow up with Dr. Fuller Plan on 12/13/19 at 9:50 am.

## 2019-10-24 NOTE — Telephone Encounter (Signed)
See alternate phone note for more information 

## 2019-10-25 ENCOUNTER — Ambulatory Visit
Admission: RE | Admit: 2019-10-25 | Discharge: 2019-10-25 | Disposition: A | Discharge status: Home / Self Care | Payer: Medicare Other | Source: Ambulatory Visit | Attending: Family Medicine | Admitting: Family Medicine

## 2019-10-25 DIAGNOSIS — R590 Localized enlarged lymph nodes: Secondary | ICD-10-CM

## 2019-10-25 DIAGNOSIS — R221 Localized swelling, mass and lump, neck: Secondary | ICD-10-CM | POA: Diagnosis not present

## 2019-11-01 ENCOUNTER — Other Ambulatory Visit: Payer: Self-pay

## 2019-11-01 ENCOUNTER — Telehealth: Payer: Self-pay

## 2019-11-01 ENCOUNTER — Other Ambulatory Visit: Payer: Medicare Other

## 2019-11-01 DIAGNOSIS — Z20822 Contact with and (suspected) exposure to covid-19: Secondary | ICD-10-CM | POA: Diagnosis not present

## 2019-11-01 NOTE — Telephone Encounter (Signed)
She can be tested here or at [facility] or urgent care.  Just schedule where she prefers.

## 2019-11-01 NOTE — Telephone Encounter (Signed)
Patient has already had testing at this time.

## 2019-11-01 NOTE — Telephone Encounter (Signed)
Amanda Hoover needs info about covid test, she has been exposed. Last Monday that's when the exposure happened, her granddaughter also has covid. She's wanting to know if she needs to be tested and if she could possibly be a carrier.

## 2019-11-02 ENCOUNTER — Other Ambulatory Visit: Payer: Self-pay | Admitting: Obstetrics

## 2019-11-02 DIAGNOSIS — Z1231 Encounter for screening mammogram for malignant neoplasm of breast: Secondary | ICD-10-CM

## 2019-11-02 LAB — SARS-COV-2, NAA 2 DAY TAT

## 2019-11-02 LAB — NOVEL CORONAVIRUS, NAA: SARS-CoV-2, NAA: NOT DETECTED

## 2019-11-30 ENCOUNTER — Other Ambulatory Visit (INDEPENDENT_AMBULATORY_CARE_PROVIDER_SITE_OTHER): Payer: Medicare Other

## 2019-11-30 ENCOUNTER — Other Ambulatory Visit: Payer: Self-pay

## 2019-11-30 DIAGNOSIS — K7460 Unspecified cirrhosis of liver: Secondary | ICD-10-CM

## 2019-11-30 LAB — CBC WITH DIFFERENTIAL/PLATELET
Basophils Absolute: 0 10*3/uL (ref 0.0–0.1)
Basophils Relative: 0.7 % (ref 0.0–3.0)
Eosinophils Absolute: 0.3 10*3/uL (ref 0.0–0.7)
Eosinophils Relative: 5.1 % — ABNORMAL HIGH (ref 0.0–5.0)
HCT: 41.2 % (ref 36.0–46.0)
Hemoglobin: 14.2 g/dL (ref 12.0–15.0)
Lymphocytes Relative: 13.2 % (ref 12.0–46.0)
Lymphs Abs: 0.7 10*3/uL (ref 0.7–4.0)
MCHC: 34.5 g/dL (ref 30.0–36.0)
MCV: 94.1 fl (ref 78.0–100.0)
Monocytes Absolute: 0.5 10*3/uL (ref 0.1–1.0)
Monocytes Relative: 10.3 % (ref 3.0–12.0)
Neutro Abs: 3.8 10*3/uL (ref 1.4–7.7)
Neutrophils Relative %: 70.7 % (ref 43.0–77.0)
Platelets: 119 10*3/uL — ABNORMAL LOW (ref 150.0–400.0)
RBC: 4.38 Mil/uL (ref 3.87–5.11)
RDW: 13.9 % (ref 11.5–15.5)
WBC: 5.3 10*3/uL (ref 4.0–10.5)

## 2019-11-30 LAB — PROTIME-INR
INR: 1.2 ratio — ABNORMAL HIGH (ref 0.8–1.0)
Prothrombin Time: 13 s (ref 9.6–13.1)

## 2019-11-30 LAB — COMPREHENSIVE METABOLIC PANEL
ALT: 25 U/L (ref 0–35)
AST: 30 U/L (ref 0–37)
Albumin: 4.4 g/dL (ref 3.5–5.2)
Alkaline Phosphatase: 98 U/L (ref 39–117)
BUN: 16 mg/dL (ref 6–23)
CO2: 26 mEq/L (ref 19–32)
Calcium: 9.8 mg/dL (ref 8.4–10.5)
Chloride: 105 mEq/L (ref 96–112)
Creatinine, Ser: 0.89 mg/dL (ref 0.40–1.20)
GFR: 63.49 mL/min (ref >60.00)
Glucose, Bld: 91 mg/dL (ref 70–99)
Potassium: 4.1 mEq/L (ref 3.5–5.1)
Sodium: 140 mEq/L (ref 135–145)
Total Bilirubin: 1 mg/dL (ref 0.2–1.2)
Total Protein: 7.4 g/dL (ref 6.0–8.3)

## 2019-12-03 LAB — AFP TUMOR MARKER: AFP-Tumor Marker: 5.4 ng/mL

## 2019-12-04 ENCOUNTER — Other Ambulatory Visit: Payer: Self-pay

## 2019-12-04 DIAGNOSIS — K7460 Unspecified cirrhosis of liver: Secondary | ICD-10-CM

## 2019-12-07 ENCOUNTER — Ambulatory Visit (HOSPITAL_COMMUNITY)
Admission: RE | Admit: 2019-12-07 | Discharge: 2019-12-07 | Disposition: A | Discharge status: Home / Self Care | Payer: Medicare Other | Source: Ambulatory Visit | Attending: Gastroenterology | Admitting: Gastroenterology

## 2019-12-07 ENCOUNTER — Other Ambulatory Visit: Payer: Self-pay

## 2019-12-07 DIAGNOSIS — K7460 Unspecified cirrhosis of liver: Secondary | ICD-10-CM

## 2019-12-11 DIAGNOSIS — R09A2 Foreign body sensation, throat: Secondary | ICD-10-CM | POA: Insufficient documentation

## 2019-12-11 DIAGNOSIS — R221 Localized swelling, mass and lump, neck: Secondary | ICD-10-CM | POA: Insufficient documentation

## 2019-12-11 DIAGNOSIS — R0989 Other specified symptoms and signs involving the circulatory and respiratory systems: Secondary | ICD-10-CM | POA: Diagnosis not present

## 2019-12-11 DIAGNOSIS — K219 Gastro-esophageal reflux disease without esophagitis: Secondary | ICD-10-CM | POA: Diagnosis not present

## 2019-12-13 ENCOUNTER — Encounter: Payer: Self-pay | Admitting: Gastroenterology

## 2019-12-13 ENCOUNTER — Ambulatory Visit (INDEPENDENT_AMBULATORY_CARE_PROVIDER_SITE_OTHER): Payer: Medicare Other | Admitting: Gastroenterology

## 2019-12-13 VITALS — BP 112/72 | Ht 64.0 in | Wt 223.5 lb

## 2019-12-13 DIAGNOSIS — K7460 Unspecified cirrhosis of liver: Secondary | ICD-10-CM

## 2019-12-13 DIAGNOSIS — K317 Polyp of stomach and duodenum: Secondary | ICD-10-CM | POA: Diagnosis not present

## 2019-12-13 DIAGNOSIS — K219 Gastro-esophageal reflux disease without esophagitis: Secondary | ICD-10-CM | POA: Diagnosis not present

## 2019-12-13 MED ORDER — FAMOTIDINE 40 MG PO TABS: 40.0000 mg | ORAL_TABLET | Freq: Every day | ORAL | 11 refills | Status: DC

## 2019-12-13 NOTE — Progress Notes (Signed)
    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  Her reflux symptoms are well controlled on pantoprazole and she has tried to discontinue it with the return of epigastric and substernal burning.  We reviewed her recent ultrasound, blood work and EGD in detail.  RUQ [place] on 9/24 without focal hepatic abnormality. INR=1.3 and platelets=119k otherwise normal blood work. EGD 04/2019 multiple gastric polyps (fundic gland polyps), no varices.   Current Medications, Allergies, Past Medical History, Past Surgical History, Family History and Social History were reviewed in [employer] [employer] record.   Physical Exam: General: Well developed, well nourished, no acute distress Head: Normocephalic and atraumatic Eyes:  sclerae anicteric, EOMI Ears: Normal auditory acuity Mouth: Not examined, mask on during Covid-19 pandemic Lungs: Clear throughout to auscultation Heart: Regular rate and rhythm; no murmurs, rubs or bruits Abdomen: Soft, non tender and non distended. No masses, hepatosplenomegaly or hernias noted. Normal Bowel sounds Rectal: Not done Musculoskeletal: Symmetrical with no gross deformities  Pulses:  Normal pulses noted Extremities: No clubbing, cyanosis, edema or deformities noted Neurological: Alert oriented x 4, grossly nonfocal Psychological:  Alert and cooperative. Normal mood and affect   Assessment and Recommendations:  1. Compensated cirrhosis, likely NASH.  No more than 2 g of Tylenol daily.  Reminded to not start drinking alcohol.  Long-term fat modified, carb modified weight loss diet supervised by her PCP.   RUQ ultrasound and blood work in 6 months. REV in 6 months.  2. GERD.  Follow antireflux measures.  With her gastric polyps she would like to try to treat GERD without a PPI.  Begin famotidine 40 mg daily.  If not effective consider famotidine 40 mg p.o. twice daily and if not effective  then resume pantoprazole 40 mg daily.  3. Multiple benign gastric fundic polyps.  Discussed that these are benign and not precancerous.  No further evaluation is necessary at this time.

## 2019-12-13 NOTE — Patient Instructions (Signed)
We have sent the following medications to your pharmacy for you to pick up at your convenience: famotidine 40 mg daily.   If famotidine does not control your reflux symptoms, then go back on pantoprazole daily.    Thank you for choosing me and [facility].  Pricilla Riffle. Dagoberto Ligas., MD., [facility] Regal

## 2019-12-17 DIAGNOSIS — X32XXXD Exposure to sunlight, subsequent encounter: Secondary | ICD-10-CM | POA: Diagnosis not present

## 2019-12-17 DIAGNOSIS — D225 Melanocytic nevi of trunk: Secondary | ICD-10-CM | POA: Diagnosis not present

## 2019-12-17 DIAGNOSIS — L570 Actinic keratosis: Secondary | ICD-10-CM | POA: Diagnosis not present

## 2019-12-17 DIAGNOSIS — L821 Other seborrheic keratosis: Secondary | ICD-10-CM | POA: Diagnosis not present

## 2020-01-14 ENCOUNTER — Other Ambulatory Visit: Payer: Self-pay

## 2020-01-14 ENCOUNTER — Ambulatory Visit (INDEPENDENT_AMBULATORY_CARE_PROVIDER_SITE_OTHER): Payer: Medicare Other | Admitting: Family Medicine

## 2020-01-14 VITALS — BP 120/76 | HR 84 | Temp 98.1°F | Ht 64.0 in | Wt 230.0 lb

## 2020-01-14 DIAGNOSIS — E049 Nontoxic goiter, unspecified: Secondary | ICD-10-CM

## 2020-01-14 DIAGNOSIS — R198 Other specified symptoms and signs involving the digestive system and abdomen: Secondary | ICD-10-CM

## 2020-01-14 DIAGNOSIS — R0989 Other specified symptoms and signs involving the circulatory and respiratory systems: Secondary | ICD-10-CM

## 2020-01-14 NOTE — Progress Notes (Signed)
Subjective:    Patient ID: Amanda Hoover, female    DOB: August 27, 1955, 65 y.o.   MRN: [id number]  HPI  I saw the patient originally in July. At that time she was 2 weeks past having a virus with blisters in her mouth and was having what she presumed to be lymphadenopathy in the neck. At that time we obtain an ultrasound of the neck which showed no significant lymphadenopathy. She now reports a pressure-like sensation inside her throat at the level of the thyroid. She also reports swelling visible on the exterior of her throat just above the manubrium in the area of the thyroid. There is soft tissue swelling in that area but on palpation it appears to simply be adipose tissue. I do not appreciate a subcutaneous mass. Thyroid is mildly enlarged but no nodularity is present. Patient denies any dysphagia or odynophagia however she does have a masslike sensation in her throat. She states that usually at night she will wake up feeling like something is pressing down on her throat making it hard for her to swallow. I believe she is describing globus sensation. She is already on medicine for acid reflux. She does report increasing anxiety. Past Medical History:  Diagnosis Date  . Acid reflux   . Allergy   . Anxiety   . Bilateral swelling of feet   . 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  . Breast cancer, right ([place])    ER 0 PR0 LN neg.  . Cancer ([facility])   . Chronic headaches   . Chronic ITP (idiopathic thrombocytopenia) (HCC)    mild thrombocytopenia, normal spleen on [place] 11/18, mild cirrhosis  . Cirrhosis ([place])   . Dental crowns present   . Depression    no current meds.  . Depression   . Fatty liver   . GERD (gastroesophageal reflux disease)   . Headache(784.0)    stress, sinus headaches  . Heart murmur   . Hiatal hernia   . Hiatal hernia   . History of colon polyps   . History of esophageal stricture    has had esophageal dilation x 1  .  History of migraine   . History of radiation therapy 03/28/2012-05/17/2012   60.4 gray to right breast  . Hyperlipidemia   . Hypothyroid   . Multiple environmental allergies   . Multiple food allergies   . Osteopenia   . Osteopenia   . Osteoporosis   . Palpitations    occasional; worse with ingestion of caffeine; no cardiologist  . Personal history of radiation therapy 2013   Right Breast Cancer  . Rash 01/06/2012   right elbow  . Rhinitis, allergic    rhinitis  . Thyroid disease    Past Surgical History:  Procedure Laterality Date  . BREAST LUMPECTOMY Right 2013  . COLONOSCOPY    . COLONOSCOPY W/ POLYPECTOMY    . ESOPHAGOGASTRODUODENOSCOPY    . MASTECTOMY Right 2013   partial mast  . PARTIAL MASTECTOMY WITH NEEDLE LOCALIZATION AND AXILLARY SENTINEL LYMPH NODE BX  01/12/2012   Procedure: PARTIAL MASTECTOMY WITH NEEDLE LOCALIZATION AND AXILLARY SENTINEL LYMPH NODE BX;  Surgeon: Adin Hector, MD;  Location: [facility];  Service: General;  Laterality: Right;  . RE-EXCISION OF BREAST CANCER,SUPERIOR MARGINS  02/15/2012   Procedure: RE-EXCISION OF BREAST CANCER,SUPERIOR MARGINS;  Surgeon: Adin Hector, MD;  Location: [facility];  Service: General;  Laterality: Right;  Re-excision of right lumpectomy,  close posterior margin.   Current Outpatient Medications on File Prior to Visit  Medication Sig Dispense Refill  . Ascorbic Acid (VITAMIN C ER PO) Take by mouth.    . Cholecalciferol (D3-1000 PO) Take by mouth.    . famotidine (PEPCID) 40 MG tablet Take 1 tablet (40 mg total) by mouth daily. 30 tablet 11  . levocetirizine (XYZAL) 5 MG tablet Take 5 mg by mouth every evening.     Marland Kitchen levothyroxine (SYNTHROID) 50 MCG tablet Take 1 tablet (50 mcg total) by mouth daily. 90 tablet 3  . Methylcobalamin (B12-ACTIVE PO) Take 1 tablet by mouth.    Marland Kitchen MILK THISTLE PO Take by mouth.  (Patient not taking: Reported on 12/13/2019)    . Multiple Vitamin (MULTIVITAMIN)  tablet Take 1 tablet by mouth daily.     No current facility-administered medications on file prior to visit.   Allergies  Allergen Reactions  . Macadamia Nut Oil Anaphylaxis  . Other Swelling    POPCORN:  SWELLING THROAT  . Peanut-Containing Drug Products Swelling and Other (See Comments)    THROAT TIGHTNESS; ALSO WALNUTS  . Tomato Swelling    SWELLING UVULA  . Adhesive [Tape] Other (See Comments)    BLISTERS  . Flour Other (See Comments)    SNEEZING  . Penicillins Hives  . Hydrocodone Hives  . Oxycodone   . Sulfa Antibiotics Nausea Only  . Vicodin [Hydrocodone-Acetaminophen] Swelling    Pt had flushing, then itching and swelling of lips  . Eggs Or Egg-Derived Products Other (See Comments)    POSITIVE ON ALLERGY TEST, BUT EATS EGGS WITHOUT PROBLEM  . Soap Other (See Comments)    FRAGRANCES (SOAP/LOTION/PERFUME):  HEADACHE, RUNNY NOSE  . Tramadol Nausea And Vomiting   Social History   Socioeconomic History  . Marital status: Married    Spouse name: Not on file  . Number of children: 4  . Years of education: Not on file  . Highest education level: Not on file  Occupational History    Employer: [employer]    Comment: Teacher, English as a foreign language for [employer]  Tobacco Use  . Smoking status: Former Smoker    Packs/day: 0.50    Years: 8.00    Pack years: 4.00    Types: Cigarettes    Quit date: 2006    Years since quitting: 15.8  . Smokeless tobacco: Never Used  Vaping Use  . Vaping Use: Never used  Substance and Sexual Activity  . Alcohol use: No  . Drug use: No    Comment: quit smoking 11 years ago  . Sexual activity: Yes    Birth control/protection: Post-menopausal  Other Topics Concern  . Not on file  Social History Narrative  . Not on file   Social Determinants of Health   Financial Resource Strain:   . Difficulty of Paying Living Expenses: Not on file  Food Insecurity:   . Worried About Charity fundraiser in the Last Year: Not on file  . Ran Out of Food in  the Last Year: Not on file  Transportation Needs:   . Lack of Transportation (Medical): Not on file  . Lack of Transportation (Non-Medical): Not on file  Physical Activity:   . Days of Exercise per Week: Not on file  . Minutes of Exercise per Session: Not on file  Stress:   . Feeling of Stress : Not on file  Social Connections:   . Frequency of Communication with Friends and Family: Not on file  .  Frequency of Social Gatherings with Friends and Family: Not on file  . Attends Religious Services: Not on file  . Active Member of Clubs or Organizations: Not on file  . Attends Archivist Meetings: Not on file  . Marital Status: Not on file  Intimate Partner Violence:   . Fear of Current or Ex-Partner: Not on file  . Emotionally Abused: Not on file  . Physically Abused: Not on file  . Sexually Abused: Not on file      Review of Systems  All other systems reviewed and are negative.      Objective:   Physical Exam Vitals reviewed.  Constitutional:      General: She is not in acute distress.    Appearance: She is well-developed. She is not diaphoretic.  HENT:     Right Ear: Tympanic membrane, ear canal and external ear normal.     Left Ear: Tympanic membrane, ear canal and external ear normal.     Nose: No mucosal edema, congestion or rhinorrhea.     Right Sinus: No maxillary sinus tenderness or frontal sinus tenderness.     Left Sinus: No maxillary sinus tenderness or frontal sinus tenderness.     Mouth/Throat:     Pharynx: No oropharyngeal exudate.  Eyes:     Conjunctiva/sclera: Conjunctivae normal.  Neck:     Thyroid: No thyroid mass, thyromegaly or thyroid tenderness.   Cardiovascular:     Rate and Rhythm: Normal rate and regular rhythm.     Heart sounds: Normal heart sounds. No murmur heard.  No friction rub. No gallop.   Pulmonary:     Effort: Pulmonary effort is normal. No respiratory distress.     Breath sounds: Normal breath sounds. No stridor. No  wheezing or rales.  Musculoskeletal:     Cervical back: Full passive range of motion without pain, normal range of motion and neck supple. No edema or erythema. No pain with movement or muscular tenderness. Normal range of motion.  Lymphadenopathy:     Cervical: No cervical adenopathy.           Assessment & Plan:  Goiter - Plan: US THYROID  Globus sensation  patient is concerned that there is a mass in the of her thyroid gland. I do not appreciate any mass today on exam. There is extra self tissue overlying her thyroid gland that I believe is adipose tissue causing a visible enlargement in that area but I do not believe it is due to an underlying goiter. However I will obtain an ultrasound to evaluate further to rule out a goiter. I believe the globus sensation is likely due to a combination of anxiety and reflux. Await the results of the thyroid ultrasound.

## 2020-01-29 ENCOUNTER — Ambulatory Visit
Admission: RE | Admit: 2020-01-29 | Discharge: 2020-01-29 | Disposition: A | Discharge status: Home / Self Care | Payer: Medicare Other | Source: Ambulatory Visit | Attending: Family Medicine | Admitting: Family Medicine

## 2020-01-29 DIAGNOSIS — E049 Nontoxic goiter, unspecified: Secondary | ICD-10-CM

## 2020-02-12 ENCOUNTER — Ambulatory Visit
Admission: RE | Admit: 2020-02-12 | Discharge: 2020-02-12 | Disposition: A | Discharge status: Home / Self Care | Payer: Medicare Other | Source: Ambulatory Visit | Attending: Obstetrics | Admitting: Obstetrics

## 2020-02-12 ENCOUNTER — Other Ambulatory Visit: Payer: Self-pay

## 2020-02-12 DIAGNOSIS — M810 Age-related osteoporosis without current pathological fracture: Secondary | ICD-10-CM

## 2020-02-12 DIAGNOSIS — Z780 Asymptomatic menopausal state: Secondary | ICD-10-CM | POA: Diagnosis not present

## 2020-02-12 DIAGNOSIS — Z1231 Encounter for screening mammogram for malignant neoplasm of breast: Secondary | ICD-10-CM

## 2020-03-06 ENCOUNTER — Encounter: Payer: Self-pay | Admitting: Nurse Practitioner

## 2020-03-06 ENCOUNTER — Other Ambulatory Visit: Payer: Medicare Other

## 2020-03-06 ENCOUNTER — Telehealth: Payer: Self-pay | Admitting: Family Medicine

## 2020-03-06 ENCOUNTER — Other Ambulatory Visit: Payer: Self-pay

## 2020-03-06 ENCOUNTER — Telehealth: Payer: Self-pay

## 2020-03-06 DIAGNOSIS — R059 Cough, unspecified: Secondary | ICD-10-CM | POA: Diagnosis not present

## 2020-03-06 NOTE — Telephone Encounter (Signed)
Spoke with Amanda Hoover she would like to come by office to have her and [name] tested for covid.

## 2020-03-06 NOTE — Telephone Encounter (Signed)
Spoke with Mrs. Galli giving her advise from her Provider.

## 2020-03-06 NOTE — Telephone Encounter (Signed)
Amanda Hoover has taken a home covid test, it showed positive, she then wanted to come by office to have another test and for Amanda Hoover tested, they have both been tested. She is considering Infusion Clinic !

## 2020-03-06 NOTE — Telephone Encounter (Signed)
Pt would like to be tested for Covid systems headache,sore throat, shortness of breath

## 2020-03-06 NOTE — Telephone Encounter (Signed)
If she has tested positive, I would recommend referral to the infusion clinic rather than wait another 2-3 days for our test to come back.

## 2020-03-06 NOTE — Telephone Encounter (Signed)
Patient needs to be put on schedule for Amanda Hoover to be tested for covid

## 2020-03-06 NOTE — Telephone Encounter (Signed)
[facility]fusion Clinic are not excepting those who are fully vaccinated at this moment, the rules may change next week they are going to keep her name on the list just if anything changes. Mrs. Fromm was made aware of this. And that also no antibiotic is available for covid

## 2020-03-09 LAB — SARS-COV-2 RNA,(COVID-19) QUALITATIVE NAAT: SARS CoV2 RNA: DETECTED — AB

## 2020-03-17 NOTE — Telephone Encounter (Signed)
This encounter was created in error - please disregard.

## 2020-03-25 DIAGNOSIS — Z853 Personal history of malignant neoplasm of breast: Secondary | ICD-10-CM | POA: Diagnosis not present

## 2020-03-25 DIAGNOSIS — Z01419 Encounter for gynecological examination (general) (routine) without abnormal findings: Secondary | ICD-10-CM | POA: Diagnosis not present

## 2020-03-25 DIAGNOSIS — Z01411 Encounter for gynecological examination (general) (routine) with abnormal findings: Secondary | ICD-10-CM | POA: Diagnosis not present

## 2020-03-25 DIAGNOSIS — Z124 Encounter for screening for malignant neoplasm of cervix: Secondary | ICD-10-CM | POA: Diagnosis not present

## 2020-04-28 ENCOUNTER — Institutional Professional Consult (permissible substitution): Payer: Medicare Other | Admitting: Neurology

## 2020-05-15 DIAGNOSIS — K7460 Unspecified cirrhosis of liver: Secondary | ICD-10-CM | POA: Diagnosis not present

## 2020-05-15 DIAGNOSIS — M810 Age-related osteoporosis without current pathological fracture: Secondary | ICD-10-CM | POA: Diagnosis not present

## 2020-06-04 ENCOUNTER — Ambulatory Visit (INDEPENDENT_AMBULATORY_CARE_PROVIDER_SITE_OTHER): Payer: Medicare Other | Admitting: Neurology

## 2020-06-04 ENCOUNTER — Institutional Professional Consult (permissible substitution): Payer: Medicare Other | Admitting: Neurology

## 2020-06-04 ENCOUNTER — Encounter: Payer: Self-pay | Admitting: Neurology

## 2020-06-04 VITALS — BP 126/82 | HR 74 | Ht 63.5 in | Wt 239.0 lb

## 2020-06-04 DIAGNOSIS — G4700 Insomnia, unspecified: Secondary | ICD-10-CM | POA: Diagnosis not present

## 2020-06-04 DIAGNOSIS — R519 Headache, unspecified: Secondary | ICD-10-CM

## 2020-06-04 DIAGNOSIS — R0689 Other abnormalities of breathing: Secondary | ICD-10-CM

## 2020-06-04 DIAGNOSIS — G2581 Restless legs syndrome: Secondary | ICD-10-CM

## 2020-06-04 DIAGNOSIS — G4719 Other hypersomnia: Secondary | ICD-10-CM

## 2020-06-04 DIAGNOSIS — R0683 Snoring: Secondary | ICD-10-CM

## 2020-06-04 DIAGNOSIS — Z9189 Other specified personal risk factors, not elsewhere classified: Secondary | ICD-10-CM

## 2020-06-04 DIAGNOSIS — R351 Nocturia: Secondary | ICD-10-CM

## 2020-06-04 DIAGNOSIS — G478 Other sleep disorders: Secondary | ICD-10-CM | POA: Diagnosis not present

## 2020-06-04 DIAGNOSIS — G479 Sleep disorder, unspecified: Secondary | ICD-10-CM

## 2020-06-04 NOTE — Progress Notes (Signed)
Subjective:    Patient ID: Amanda Hoover is a 65 y.o. female.  HPI     Star Age, MD, PhD [facility] [address] [place], [place] [address]  Dear Dr. Pamala Hurry,   I saw your patient, Amanda Hoover, upon your kind request in my sleep clinic today for initial consultation of her sleep disorder, in particular, concern for underlying obstructive sleep apnea.  The patient is unaccompanied today.  As you know, Amanda Hoover is a 65 year old right-handed woman with an underlying medical history of hypothyroidism, osteoporosis, allergies, ITP, liver disease, depression, anxiety, reflux disease, hyperlipidemia, palpitations, breast cancer with status post surgery in 2014, status post XRT and tamoxifen, and severe obesity with a BMI of over 40, who reports snoring and excessive daytime somnolence, nonrestorative sleep as well as chronic difficulty initiating and maintaining sleep for years.  She also reports that her mom had difficulty sleeping.  She reports that she does not sleep in the same bedroom with her husband and because of therapy individual sleep difficulties.  She has woken up occasionally with a sense of gasping for air.  She has occasionally woken up with a headache.  She does have nocturia about once or twice per average night.  She does not have a family history of sleep apnea.  She tries to go to bed around 10, sometimes later, she watches TV in bed and sometimes leaves the TV on on low volume.  She tries to read to relax.  She wakes up multiple times in the middle of the night, sometimes without a reason.  Rise time is currently between 6:30 AM and 6:45 AM.  She works part-time, 4 days a week, she works currently as a Doctor, hospital at [employer].  She quit smoking in 2004 or 2005.  I reviewed your office note from 03/25/2020.  Her Epworth sleepiness score is 6 out of 24, fatigue severity score is 32 out of 63.  Her weight has been fluctuating.  She  went to the weight loss clinic and had lost about 28 pounds but has gained some weight back.  She tends to sleep elevated, sometimes she uses 4 pillows to sleep, she feels like she breathes better when sleeping upright.  She has a history of migraines and used to be followed at the headache wellness clinic.  She had side effects with Topamax, particularly cognitive side effects.  She drinks caffeine in the form of coffee, 1 or 2 cups/day on average.  She does not drink any alcohol.  Her Past Medical History Is Significant For: Past Medical History:  Diagnosis Date  . Acid reflux   . Allergy   . Anxiety   . Bilateral swelling of feet   . 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  . Breast cancer, right ([facility])    ER 0 PR0 LN neg.  . Cancer ([place])   . Chronic headaches   . Chronic ITP (idiopathic thrombocytopenia) ([facility])    mild thrombocytopenia, normal spleen on [place] 11/18, mild cirrhosis  . Cirrhosis ([place])   . Dental crowns present   . Depression    no current meds.  . Depression   . Fatty liver   . GERD (gastroesophageal reflux disease)   . Headache(784.0)    stress, sinus headaches  . Heart murmur   . Hiatal hernia   . Hiatal hernia   . History of colon polyps   . History of esophageal  stricture    has had esophageal dilation x 1  . History of migraine   . History of radiation therapy 03/28/2012-05/17/2012   60.4 gray to right breast  . Hyperlipidemia   . Hypothyroid   . Multiple environmental allergies   . Multiple food allergies   . Osteopenia   . Osteopenia   . Osteoporosis   . Palpitations    occasional; worse with ingestion of caffeine; no cardiologist  . Personal history of radiation therapy 2013   Right Breast Cancer  . Rash 01/06/2012   right elbow  . Rhinitis, allergic    rhinitis  . Thyroid disease     Her Past Surgical History Is Significant For: Past Surgical History:  Procedure Laterality Date  . BREAST LUMPECTOMY  Right 2013  . COLONOSCOPY    . COLONOSCOPY W/ POLYPECTOMY    . ESOPHAGOGASTRODUODENOSCOPY    . MASTECTOMY Right 2013   partial mast  . PARTIAL MASTECTOMY WITH NEEDLE LOCALIZATION AND AXILLARY SENTINEL LYMPH NODE BX  01/12/2012   Procedure: PARTIAL MASTECTOMY WITH NEEDLE LOCALIZATION AND AXILLARY SENTINEL LYMPH NODE BX;  Surgeon: Adin Hector, MD;  Location: [facility];  Service: General;  Laterality: Right;  . RE-EXCISION OF BREAST CANCER,SUPERIOR MARGINS  02/15/2012   Procedure: RE-EXCISION OF BREAST CANCER,SUPERIOR MARGINS;  Surgeon: Adin Hector, MD;  Location: [facility];  Service: General;  Laterality: Right;  Re-excision of right lumpectomy, close posterior margin.    Her Family History Is Significant For: Family History  Problem Relation Age of Onset  . Crohn's disease Mother   . Irritable bowel syndrome Mother   . Hypertension Mother   . Hyperlipidemia Mother   . Stroke Mother   . Thyroid disease Mother   . Depression Mother   . Esophageal cancer Father 52  . Cancer Father   . Breast cancer Paternal Aunt        dx in her early 31s  . Diabetes Maternal Grandmother   . Breast cancer Paternal Grandmother 5  . Diabetes Paternal Grandmother   . Cancer Maternal Uncle        unknown form of cancer  . Melanoma Cousin        maternal cousin dx in her 15s  . Breast cancer Other   . Liver disease Other   . Colon cancer Neg Hx   . Rectal cancer Neg Hx   . Stomach cancer Neg Hx     Her Social History Is Significant For: Social History   Socioeconomic History  . Marital status: Married    Spouse name: Not on file  . Number of children: 4  . Years of education: Not on file  . Highest education level: Not on file  Occupational History    Employer: [employer]    Comment: Teacher, English as a foreign language for [employer]  Tobacco Use  . Smoking status: Former Smoker    Packs/day: 0.50    Years: 8.00    Pack years: 4.00    Types: Cigarettes    Quit  date: 2006    Years since quitting: 16.2  . Smokeless tobacco: Never Used  Vaping Use  . Vaping Use: Never used  Substance and Sexual Activity  . Alcohol use: No  . Drug use: No    Comment: quit smoking 11 years ago  . Sexual activity: Yes    Birth control/protection: Post-menopausal  Other Topics Concern  . Not on file  Social History Narrative  . Not on file  Social Determinants of Health   Financial Resource Strain: Not on file  Food Insecurity: Not on file  Transportation Needs: Not on file  Physical Activity: Not on file  Stress: Not on file  Social Connections: Not on file    Her Allergies Are:  Allergies  Allergen Reactions  . Macadamia Nut Oil Anaphylaxis  . Other Swelling    POPCORN:  SWELLING THROAT  . Peanut-Containing Drug Products Swelling and Other (See Comments)    THROAT TIGHTNESS; ALSO WALNUTS  . Tomato Swelling    SWELLING UVULA  . Adhesive [Tape] Other (See Comments)    BLISTERS  . Flour Other (See Comments)    SNEEZING  . Penicillins Hives  . Hydrocodone Hives  . Oxycodone   . Sulfa Antibiotics Nausea Only  . Vicodin [Hydrocodone-Acetaminophen] Swelling    Pt had flushing, then itching and swelling of lips  . Eggs Or Egg-Derived Products Other (See Comments)    POSITIVE ON ALLERGY TEST, BUT EATS EGGS WITHOUT PROBLEM  . Soap Other (See Comments)    FRAGRANCES (SOAP/LOTION/PERFUME):  HEADACHE, RUNNY NOSE  . Tramadol Nausea And Vomiting  :   Her Current Medications Are:  Outpatient Encounter Medications as of 06/04/2020  Medication Sig  . Ascorbic Acid (VITAMIN C ER PO) Take by mouth.  . Cholecalciferol (D3-1000 PO) Take by mouth.  . famotidine (PEPCID) 40 MG tablet Take 1 tablet (40 mg total) by mouth daily.  Marland Kitchen levocetirizine (XYZAL) 5 MG tablet Take 5 mg by mouth every evening.   Marland Kitchen levothyroxine (SYNTHROID) 50 MCG tablet Take 1 tablet (50 mcg total) by mouth daily.  . Methylcobalamin (B12-ACTIVE PO) Take 1 tablet by mouth.  . Multiple  Vitamin (MULTIVITAMIN) tablet Take 1 tablet by mouth daily.  . pantoprazole (PROTONIX) 40 MG tablet 1 mg.  . [DISCONTINUED] MILK THISTLE PO Take by mouth.  (Patient not taking: No sig reported)   No facility-administered encounter medications on file as of 06/04/2020.  :   Review of Systems:  Out of a complete 14 point review of systems, all are reviewed and negative with the exception of these symptoms as listed below:  Review of Systems  Neurological:       Here for sleep consult. No prior sleep study reports she does snore some at night. Epworth Sleepiness Scale 0= would never doze 1= slight chance of dozing 2= moderate chance of dozing 3= high chance of dozing  Sitting and reading:2 Watching TV:1 Sitting inactive in a public place (ex. Theater or meeting):0 As a passenger in a car for an hour without a break:1 Lying down to rest in the afternoon:2 Sitting and talking to someone:0 Sitting quietly after lunch (no alcohol):0 In a car, while stopped in traffic:0 Total:6     Objective:  Neurological Exam  Physical Exam Physical Examination:   Vitals:   06/04/20 1336  BP: 126/82  Pulse: 74  SpO2: 97%    General Examination: The patient is a very pleasant 65 y.o. female in no acute distress. She appears well-developed and well-nourished and well groomed.   HEENT: Normocephalic, atraumatic, pupils are equal, round and reactive to light, extraocular tracking is good without limitation to gaze excursion or nystagmus noted. Hearing is grossly intact. Face is symmetric with normal facial animation. Speech is clear with no dysarthria noted. There is no hypophonia. There is no lip, neck/head, jaw or voice tremor. Neck is supple with full range of passive and active motion. There are no carotid bruits on auscultation. Oropharynx  exam reveals: mild mouth dryness, adequate dental hygiene and moderate airway crowding, due to small airway entry, tonsillar size is about 1+.  Mallampati  class IV, neck circumference 15 5/8 inches.  Tongue protrudes centrally and palate elevates symmetrically.  Chest: Clear to auscultation without wheezing, rhonchi or crackles noted.  Heart: S1+S2+0, regular and normal without murmurs, rubs or gallops noted.   Abdomen: Soft, non-tender and non-distended with normal bowel sounds appreciated on auscultation.  Extremities: There is trace pitting edema in the distal lower extremities bilaterally.   Skin: Warm and dry without trophic changes noted.   Musculoskeletal: exam reveals no obvious joint deformities, tenderness or joint swelling or erythema.   Neurologically:  Mental status: The patient is awake, alert and oriented in all 4 spheres. Her immediate and remote memory, attention, language skills and fund of knowledge are appropriate. There is no evidence of aphasia, agnosia, apraxia or anomia. Speech is clear with normal prosody and enunciation. Thought process is linear. Mood is normal and affect is normal.  Cranial nerves II - XII are as described above under HEENT exam.  Motor exam: Normal bulk, strength and tone is noted. There is no tremor. Fine motor skills and coordination: grossly intact.  Cerebellar testing: No dysmetria or intention tremor. There is no truncal or gait ataxia.  Sensory exam: intact to light touch in the upper and lower extremities.  Gait, station and balance: She stands easily. No veering to one side is noted. No leaning to one side is noted. Posture is age-appropriate and stance is narrow based. Gait shows normal stride length and normal pace. No problems turning are noted.   Assessment and Plan:  In summary, Amanda Hoover is a very pleasant 65 y.o.-year old female with an underlying medical history of hypothyroidism, osteoporosis, allergies, ITP, liver disease, depression, anxiety, reflux disease, hyperlipidemia, palpitations, breast cancer with status post surgery in 2014, status post XRT and tamoxifen,and severe  obesity with a BMI of over 40, whose history and physical exam are concerning for obstructive sleep apnea (OSA). I had a long chat with the patient about my findings and the diagnosis of OSA, its prognosis and treatment options. We talked about medical treatments, surgical interventions and non-pharmacological approaches. I explained in particular the risks and ramifications of untreated moderate to severe OSA, especially with respect to developing cardiovascular disease down the Road, including congestive heart failure, difficult to treat hypertension, cardiac arrhythmias, or stroke. Even type 2 diabetes has, in part, been linked to untreated OSA. Symptoms of untreated OSA include daytime sleepiness, memory problems, mood irritability and mood disorder such as depression and anxiety, lack of energy, as well as recurrent headaches, especially morning headaches. We talked about trying to maintain a healthy lifestyle in general, as well as the importance of weight control. We also talked about the importance of good sleep hygiene. I recommended the following at this time: sleep study.   I explained the sleep test procedure to the patient and also outlined possible surgical and non-surgical treatment options of OSA, including the use of a custom-made dental device (which would require a referral to a specialist dentist or oral surgeon), upper airway surgical options, such as traditional UPPP or a novel less invasive surgical option in the form of Inspire hypoglossal nerve stimulation (which would involve a referral to an ENT surgeon). I also explained the CPAP treatment option to the patient, who indicated that she would be willing to try CPAP if the need arises. I explained the importance  of being compliant with PAP treatment, not only for insurance purposes but primarily to improve Her symptoms, and for the patient's long term health benefit, including to reduce Her cardiovascular risks. I answered all her  questions today and the patient was in agreement. I plan to see her back after the sleep study is completed and encouraged her to call with any interim questions, concerns, problems or updates.   Thank you very much for allowing me to participate in the care of this nice patient. If I can be of any further assistance to you please do not hesitate to call me at [phone].  Sincerely,   Star Age, MD, PhD

## 2020-06-04 NOTE — Patient Instructions (Signed)

## 2020-06-11 ENCOUNTER — Telehealth: Payer: Self-pay

## 2020-06-11 NOTE — Telephone Encounter (Signed)
LVM for pt to call me back to schedule sleep study  

## 2020-07-09 ENCOUNTER — Ambulatory Visit (INDEPENDENT_AMBULATORY_CARE_PROVIDER_SITE_OTHER): Payer: Medicare Other | Admitting: Neurology

## 2020-07-09 DIAGNOSIS — R0683 Snoring: Secondary | ICD-10-CM

## 2020-07-09 DIAGNOSIS — R351 Nocturia: Secondary | ICD-10-CM

## 2020-07-09 DIAGNOSIS — R0689 Other abnormalities of breathing: Secondary | ICD-10-CM

## 2020-07-09 DIAGNOSIS — Z9189 Other specified personal risk factors, not elsewhere classified: Secondary | ICD-10-CM

## 2020-07-09 DIAGNOSIS — R519 Headache, unspecified: Secondary | ICD-10-CM

## 2020-07-09 DIAGNOSIS — G4719 Other hypersomnia: Secondary | ICD-10-CM

## 2020-07-09 DIAGNOSIS — G4700 Insomnia, unspecified: Secondary | ICD-10-CM

## 2020-07-09 DIAGNOSIS — G479 Sleep disorder, unspecified: Secondary | ICD-10-CM

## 2020-07-09 DIAGNOSIS — G478 Other sleep disorders: Secondary | ICD-10-CM

## 2020-07-09 DIAGNOSIS — G2581 Restless legs syndrome: Secondary | ICD-10-CM

## 2020-07-09 DIAGNOSIS — G4733 Obstructive sleep apnea (adult) (pediatric): Secondary | ICD-10-CM

## 2020-07-10 NOTE — Progress Notes (Signed)
See procedure note.

## 2020-07-11 NOTE — Progress Notes (Signed)
Patient referred by Dr. Pamala Hurry, seen by me on 06/04/20, HST on 07/09/20.    Please call and notify the patient that the recent home sleep test showed obstructive sleep apnea. OSA is overall mild, but would be worth treating to see if she feels better after treatment. We can, to that end, consider treatment in the form of autoPAP, which means, that we don't have to bring her in for a sleep study with CPAP, but will let her try an autoPAP machine at home, through a DME company (of her choice, or as per insurance requirement). The DME representative will educate her on how to use the machine, how to put the mask on, etc. I have not put an order in yet, please let me know, how she would like to proceed. Alternative treatment could be weight loss and avoiding the supine sleep position or a dental device. If she would like to pursue dental treatment, I can place a referral to dentistry, a dentist of her choosing, or we can refer to one of the practices we know participate in OSA treatment. Again, let me know.   Amanda Age, MD, PhD [facility])

## 2020-07-11 NOTE — Procedures (Signed)
[facility] [place] ([facility])  STUDY DATE: 07/09/20  DOB: Feb 28, 1956  MRN: [id number]  ORDERING CLINICIAN: Star Age, MD, PhD   REFERRING CLINICIAN: Dr. Pamala Hurry   CLINICAL INFORMATION/HISTORY: 65 year old woman with a history of hypothyroidism, osteoporosis, allergies, ITP, liver disease, depression, anxiety, reflux disease, hyperlipidemia, palpitations, breast cancer with status post surgery in 2014, status post XRT and tamoxifen, and severe obesity with a BMI of over 40, who reports snoring and excessive daytime somnolence, nonrestorative sleep as well as chronic difficulty initiating and maintaining sleep for years.    Epworth sleepiness score: 6/24.  BMI: 41.0 kg/m  Neck Circumference: 15-5/8"  FINDINGS:   Total Record Time (hours, min): 8 H 13 min  Total Sleep Time (hours, min):  6 H 16 min   Percent REM (%):    20.47 %   Calculated pAHI (per hour):  6.1      REM pAHI: 10.9  NREM pAHI: 4.8 Supine AHI: 3.9   Oxygen Saturation (%) Mean: 93  Minimum oxygen saturation (%):        89   O2 Saturation Range (%): 89-99  O2Saturation (minutes) <=88%: 0 min  Pulse Mean (bpm):    67  Pulse Range (48-97)   IMPRESSION: OSA (obstructive sleep apnea), mild   RECOMMENDATION:  This HST shows mild obstructive sleep apnea, with an AHI of 6.1/hour and O2 nadir of 89%. Intermittent mild to moderate snoring was noted. Treatment with positive airway pressure can be considered with autoPAP, if desired by patient. Treatment options otherwise include weight loss and avoidance of the supine sleep position or a dental device. These different avenues will be discussed with the patient. The patient will be seen in follow up in sleep clinic, if necessary. Please note, that other causes of the patient's symptoms, including circadian rhythm disturbances, an underlying mood disorder, medication effect and/or an underlying medical problem cannot be ruled out based on this  test. Clinical correlation is recommended. The patient should be cautioned not to drive, work at heights, or operate dangerous or heavy equipment when tired or sleepy. Review and reiteration of good sleep hygiene measures should be pursued with any patient. The referring provider will be notified of the test results.   I certify that I have reviewed the raw data recording prior to the issuance of this report in accordance with the standards of the American Academy of Sleep Medicine (AASM).  INTERPRETING PHYSICIAN:  Star Age, MD, PhD  Board Certified in Neurology and Sleep Medicine  [facility] [address], [place], [place] [address] [phone]  Sleep Summary  Oxygen Saturation Statistics   Start Study Time: End Study Time: Total Recording Time:      10:33:11 PM 6:46:17 AM   8 h, 13 min  Total Sleep Time % REM of Sleep Time:  6 h, 16 min  20.5    Mean: 93 Minimum: 89 Maximum: 99  Mean of Desaturations Nadirs (%):   92  Oxygen Desatur. %: 4-9 10-20 >20 Total  Events Number Total  19 100.0  0 0.0  0 0.0  19 100.0  Oxygen Saturation: <90 <=88 <85 <80 <70  Duration (minutes): Sleep % 0.0 0.0 0.0 0.0 0.0 0.0 0.0 0.0 0.0 0.0     Respiratory Indices      Total Events REM NREM All Night  pRDI: pAHI 3%: ODI 4%: pAHIc 3%: % CSR: pAHI 4%:  50  38  19  5 0.0  15 15.6 10.9 5.5 1.3 6.0 4.8 2.4 1.7 8.0 6.1 3.0 1.6 2.4       Pulse Rate Statistics during Sleep (BPM)      Mean: 67 Minimum: 48 Maximum: 97        Body Position Statistics  Position Supine Prone Right Left Non-Supine  Sleep (min) 30.5 0.0 0.0 108.5 108.5  Sleep % 8.1 0.0 0.0 28.8 28.8  pRDI 5.9 N/A N/A 8.3 8.3  pAHI 3% 3.9 N/A N/A 6.1 6.1  ODI 4% 2.0 N/A N/A 1.7 1.7     Snoring Statistics Snoring Level (dB) >40 >50 >60 >70 >80 >Threshold (45)  Sleep (min) 140.9 12.4 3.3 0.7 0.0 47.8  Sleep % 37.5 3.3 0.9 0.2 0.0 12.7    Mean: 42 dB

## 2020-07-14 ENCOUNTER — Telehealth: Payer: Self-pay

## 2020-07-14 NOTE — Telephone Encounter (Signed)
I called the pt and advised of results. Pt verbalized understanding of results and chose to forgo treatment of autopap and oral appliance at this time. Pt sts she is going to work on weight loss and call back if this needs to be reassessed. Report sent to Dr. Tiburcio Bash and Dr. Dennard Schaumann.

## 2020-07-14 NOTE — Telephone Encounter (Signed)
-----   Message from Star Age, MD sent at 07/11/2020 11:21 AM EDT ----- Patient referred by Dr. Pamala Hurry, seen by me on 06/04/20, HST on 07/09/20.    Please call and notify the patient that the recent home sleep test showed obstructive sleep apnea. OSA is overall mild, but would be worth treating to see if she feels better after treatment. We can, to that end, consider treatment in the form of autoPAP, which means, that we don't have to bring her in for a sleep study with CPAP, but will let her try an autoPAP machine at home, through a DME company (of her choice, or as per insurance requirement). The DME representative will educate her on how to use the machine, how to put the mask on, etc. I have not put an order in yet, please let me know, how she would like to proceed. Alternative treatment could be weight loss and avoiding the supine sleep position or a dental device. If she would like to pursue dental treatment, I can place a referral to dentistry, a dentist of her choosing, or we can refer to one of the practices we know participate in OSA treatment. Again, let me know.   Star Age, MD, PhD [facility])

## 2020-07-15 NOTE — Telephone Encounter (Signed)
Noted, thank you

## 2020-07-24 ENCOUNTER — Other Ambulatory Visit (INDEPENDENT_AMBULATORY_CARE_PROVIDER_SITE_OTHER): Payer: Medicare Other

## 2020-07-24 DIAGNOSIS — K7460 Unspecified cirrhosis of liver: Secondary | ICD-10-CM | POA: Diagnosis not present

## 2020-07-24 LAB — PROTIME-INR
INR: 1.1 ratio — ABNORMAL HIGH (ref 0.8–1.0)
Prothrombin Time: 12.5 s (ref 9.6–13.1)

## 2020-07-24 LAB — CBC
HCT: 42.1 % (ref 36.0–46.0)
Hemoglobin: 14.5 g/dL (ref 12.0–15.0)
MCHC: 34.5 g/dL (ref 30.0–36.0)
MCV: 93.7 fl (ref 78.0–100.0)
Platelets: 138 10*3/uL — ABNORMAL LOW (ref 150.0–400.0)
RBC: 4.49 Mil/uL (ref 3.87–5.11)
RDW: 14 % (ref 11.5–15.5)
WBC: 5.8 10*3/uL (ref 4.0–10.5)

## 2020-07-24 LAB — COMPREHENSIVE METABOLIC PANEL
ALT: 39 U/L — ABNORMAL HIGH (ref 0–35)
AST: 46 U/L — ABNORMAL HIGH (ref 0–37)
Albumin: 4.6 g/dL (ref 3.5–5.2)
Alkaline Phosphatase: 101 U/L (ref 39–117)
BUN: 15 mg/dL (ref 6–23)
CO2: 27 mEq/L (ref 19–32)
Calcium: 10 mg/dL (ref 8.4–10.5)
Chloride: 104 mEq/L (ref 96–112)
Creatinine, Ser: 0.89 mg/dL (ref 0.40–1.20)
GFR: 67.53 mL/min (ref >60.00)
Glucose, Bld: 100 mg/dL — ABNORMAL HIGH (ref 70–99)
Potassium: 4.2 mEq/L (ref 3.5–5.1)
Sodium: 140 mEq/L (ref 135–145)
Total Bilirubin: 0.8 mg/dL (ref 0.2–1.2)
Total Protein: 7.7 g/dL (ref 6.0–8.3)

## 2020-07-25 LAB — AFP TUMOR MARKER: AFP-Tumor Marker: 5.6 ng/mL

## 2020-08-10 ENCOUNTER — Other Ambulatory Visit: Payer: Self-pay | Admitting: Family Medicine

## 2020-08-10 DIAGNOSIS — E038 Other specified hypothyroidism: Secondary | ICD-10-CM

## 2020-08-28 DIAGNOSIS — H35363 Drusen (degenerative) of macula, bilateral: Secondary | ICD-10-CM | POA: Diagnosis not present

## 2020-08-28 DIAGNOSIS — H04123 Dry eye syndrome of bilateral lacrimal glands: Secondary | ICD-10-CM | POA: Diagnosis not present

## 2020-08-28 DIAGNOSIS — H43811 Vitreous degeneration, right eye: Secondary | ICD-10-CM | POA: Diagnosis not present

## 2020-08-28 DIAGNOSIS — H2513 Age-related nuclear cataract, bilateral: Secondary | ICD-10-CM | POA: Diagnosis not present

## 2020-09-02 ENCOUNTER — Other Ambulatory Visit: Payer: Self-pay | Admitting: *Deleted

## 2020-09-02 MED ORDER — EPINEPHRINE 0.3 MG/0.3ML IJ SOAJ: 0.3000 mg | INTRAMUSCULAR | 1 refills | Status: DC | PRN

## 2020-09-22 DIAGNOSIS — L308 Other specified dermatitis: Secondary | ICD-10-CM | POA: Diagnosis not present

## 2020-09-22 DIAGNOSIS — C44612 Basal cell carcinoma of skin of right upper limb, including shoulder: Secondary | ICD-10-CM | POA: Diagnosis not present

## 2020-09-22 DIAGNOSIS — L821 Other seborrheic keratosis: Secondary | ICD-10-CM | POA: Diagnosis not present

## 2020-09-28 ENCOUNTER — Emergency Department (HOSPITAL_COMMUNITY): Payer: Medicare Other

## 2020-09-28 ENCOUNTER — Other Ambulatory Visit: Payer: Self-pay

## 2020-09-28 ENCOUNTER — Encounter (HOSPITAL_COMMUNITY): Payer: Self-pay | Admitting: Emergency Medicine

## 2020-09-28 ENCOUNTER — Emergency Department (HOSPITAL_COMMUNITY)
Admission: EM | Admit: 2020-09-28 | Discharge: 2020-09-28 | Disposition: A | Discharge status: Home / Self Care | Payer: Medicare Other | Attending: Emergency Medicine | Admitting: Emergency Medicine

## 2020-09-28 DIAGNOSIS — R079 Chest pain, unspecified: Secondary | ICD-10-CM | POA: Diagnosis not present

## 2020-09-28 DIAGNOSIS — Z91010 Allergy to peanuts: Secondary | ICD-10-CM | POA: Insufficient documentation

## 2020-09-28 DIAGNOSIS — Z853 Personal history of malignant neoplasm of breast: Secondary | ICD-10-CM | POA: Insufficient documentation

## 2020-09-28 DIAGNOSIS — Z79899 Other long term (current) drug therapy: Secondary | ICD-10-CM | POA: Insufficient documentation

## 2020-09-28 DIAGNOSIS — R0789 Other chest pain: Secondary | ICD-10-CM | POA: Diagnosis not present

## 2020-09-28 DIAGNOSIS — E039 Hypothyroidism, unspecified: Secondary | ICD-10-CM | POA: Insufficient documentation

## 2020-09-28 DIAGNOSIS — Z87891 Personal history of nicotine dependence: Secondary | ICD-10-CM | POA: Diagnosis not present

## 2020-09-28 LAB — BASIC METABOLIC PANEL
Anion gap: 7 (ref 5–15)
BUN: 14 mg/dL (ref 8–23)
CO2: 25 mmol/L (ref 22–32)
Calcium: 9.5 mg/dL (ref 8.9–10.3)
Chloride: 108 mmol/L (ref 98–111)
Creatinine, Ser: 1.13 mg/dL — ABNORMAL HIGH (ref 0.44–1.00)
GFR, Estimated: 54 mL/min — ABNORMAL LOW (ref >60)
Glucose, Bld: 103 mg/dL — ABNORMAL HIGH (ref 70–99)
Potassium: 3.9 mmol/L (ref 3.5–5.1)
Sodium: 140 mmol/L (ref 135–145)

## 2020-09-28 LAB — TROPONIN I (HIGH SENSITIVITY)
Troponin I (High Sensitivity): 4 ng/L (ref <18)
Troponin I (High Sensitivity): 3 ng/L (ref <18)

## 2020-09-28 LAB — CBC
HCT: 41.7 % (ref 36.0–46.0)
Hemoglobin: 13.9 g/dL (ref 12.0–15.0)
MCH: 31.9 pg (ref 26.0–34.0)
MCHC: 33.3 g/dL (ref 30.0–36.0)
MCV: 95.6 fL (ref 80.0–100.0)
Platelets: 148 10*3/uL — ABNORMAL LOW (ref 150–400)
RBC: 4.36 MIL/uL (ref 3.87–5.11)
RDW: 13.9 % (ref 11.5–15.5)
WBC: 6.4 10*3/uL (ref 4.0–10.5)
nRBC: 0 % (ref 0.0–0.2)

## 2020-09-28 NOTE — ED Provider Notes (Signed)
[facility] EMERGENCY DEPARTMENT Provider Note   CSN: [id number] Arrival date & time: 09/28/20  1401     History Chief Complaint  Patient presents with   Chest Pain    Amanda Hoover is a 65 y.o. female history of depression, breast cancer in remission, here presenting with chest pain.  Patient states that she was at the states that around noontime.  She had sudden onset of left-sided chest pain.  She states that she came to the ER and now she is pain-free.  No meds prior to arrival.  She states that she was admitted previously for this and had a stress test that was unremarkable and saw cardiology and does not have any stents in her heart.  She has no known CAD.  Patient denies any shortness of breath or recent travel.  The history is provided by the patient.      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   Breast cancer, right ([place])    ER 0 PR0 LN neg.   Cancer [facility])    Chronic headaches    Chronic ITP (idiopathic thrombocytopenia) (HCC)    mild thrombocytopenia, normal spleen on [place] 11/18, mild cirrhosis   Cirrhosis ([place])    Dental crowns present    Depression    no current meds.   Depression    Fatty liver    GERD (gastroesophageal reflux disease)    Headache(784.0)    stress, sinus headaches   Heart murmur    Hiatal hernia    Hiatal hernia    History of colon polyps    History of esophageal stricture    has had esophageal dilation x 1   History of migraine    History of radiation therapy 03/28/2012-05/17/2012   60.4 gray to right breast   Hyperlipidemia    Hypothyroid    Multiple environmental allergies    Multiple food allergies    Osteopenia    Osteopenia    Osteoporosis    Palpitations    occasional; worse with ingestion of caffeine; no cardiologist   Personal history of radiation therapy  2013   Right Breast Cancer   Rash 01/06/2012   right elbow   Rhinitis, allergic    rhinitis   Thyroid disease     Patient Active Problem List   Diagnosis Date Noted   Breast lump in female-Right 04/09/2019   Chronic ITP (idiopathic thrombocytopenia) (HCC)    Osteopenia 10/10/2014   BMI 39.0-39.9,adult 01/01/2014   Elevated liver enzymes 07/26/2013   Hot flashes due to tamoxifen 07/26/2013   Thrombocytopenia ([place]) 01/29/2013   Chronic headaches    Primary cancer of lower-inner quadrant of right female breast ([place]) 12/23/2011   Chest pain 07/06/2010   Palpitations 07/06/2010   GERD 10/02/2008   OTHER DYSPHAGIA 10/02/2008    Past Surgical History:  Procedure Laterality Date   BREAST LUMPECTOMY Right 2013   COLONOSCOPY     COLONOSCOPY W/ POLYPECTOMY     ESOPHAGOGASTRODUODENOSCOPY     MASTECTOMY Right 2013   partial mast   PARTIAL MASTECTOMY WITH NEEDLE LOCALIZATION AND AXILLARY SENTINEL LYMPH NODE BX  01/12/2012   Procedure: PARTIAL MASTECTOMY WITH NEEDLE LOCALIZATION AND AXILLARY SENTINEL LYMPH NODE BX;  Surgeon: Adin Hector, MD;  Location: [facility];  Service: General;  Laterality: Right;   RE-EXCISION OF BREAST CANCER,SUPERIOR MARGINS  02/15/2012   Procedure: RE-EXCISION OF BREAST CANCER,SUPERIOR MARGINS;  Surgeon: Adin Hector, MD;  Location: [facility];  Service: General;  Laterality: Right;  Re-excision of right lumpectomy, close posterior margin.     OB History     Gravida  4   Para      Term      Preterm      AB      Living         SAB      IAB      Ectopic      Multiple      Live Births              Family History  Problem Relation Age of Onset   Crohn's disease Mother    Irritable bowel syndrome Mother    Hypertension Mother    Hyperlipidemia Mother    Stroke Mother    Thyroid disease Mother    Depression Mother    Esophageal cancer Father 51   Cancer Father    Breast cancer Paternal Aunt         dx in her early 27s   Diabetes Maternal Grandmother    Breast cancer Paternal Grandmother 38   Diabetes Paternal Grandmother    Cancer Maternal Uncle        unknown form of cancer   Melanoma Cousin        maternal cousin dx in her 8s   Breast cancer Other    Liver disease Other    Colon cancer Neg Hx    Rectal cancer Neg Hx    Stomach cancer Neg Hx     Social History   Tobacco Use   Smoking status: Former    Packs/day: 0.50    Years: 8.00    Pack years: 4.00    Types: Cigarettes    Quit date: 2006    Years since quitting: 16.5   Smokeless tobacco: Never  Vaping Use   Vaping Use: Never used  Substance Use Topics   Alcohol use: No   Drug use: No    Comment: quit smoking 11 years ago    Home Medications Prior to Admission medications   Medication Sig Start Date End Date Taking? Authorizing Provider  Ascorbic Acid (VITAMIN C ER PO) Take by mouth.    [provider]  Cholecalciferol (D3-1000 PO) Take by mouth.    [provider]  EPINEPHrine 0.3 mg/0.3 mL IJ SOAJ injection Inject 0.3 mg into the muscle as needed for anaphylaxis. 09/02/20   Susy Frizzle, MD  famotidine (PEPCID) 40 MG tablet Take 1 tablet (40 mg total) by mouth daily. 12/13/19   Ladene Artist, MD  levocetirizine (XYZAL) 5 MG tablet Take 5 mg by mouth every evening.     [provider]  levothyroxine (SYNTHROID) 50 MCG tablet TAKE 1 TABLET(50 MCG) BY MOUTH DAILY 08/12/20   Susy Frizzle, MD  Methylcobalamin (B12-ACTIVE PO) Take 1 tablet by mouth.    [provider]  Multiple Vitamin (MULTIVITAMIN) tablet Take 1 tablet by mouth daily.    [provider]  pantoprazole (PROTONIX) 40 MG tablet TAKE 1 TABLET(40 MG) BY MOUTH DAILY 08/12/20   Susy Frizzle, MD    Allergies    Macadamia nut oil, Other, Peanut-containing drug products, Tomato, Adhesive [tape], Flour, Penicillins, Hydrocodone, Oxycodone, Sulfa antibiotics, Vicodin  [hydrocodone-acetaminophen], Eggs or egg-derived products, Soap, and Tramadol  Review of Systems   Review of Systems  Cardiovascular:  Positive for chest pain.  All other systems reviewed and are negative.  Physical Exam Updated Vital Signs BP 116/71   Pulse 64   Temp 98.1 F (36.7 C) (Oral)   Resp (!) 21   SpO2 100%   Physical Exam Vitals and nursing note reviewed.  Constitutional:      Appearance: She is well-developed.  HENT:     Head: Normocephalic.  Eyes:     Extraocular Movements: Extraocular movements intact.     Pupils: Pupils are equal, round, and reactive to light.  Cardiovascular:     Rate and Rhythm: Normal rate and regular rhythm.     Heart sounds: Normal heart sounds.  Pulmonary:     Effort: Pulmonary effort is normal.     Breath sounds: Normal breath sounds.  Abdominal:     General: Bowel sounds are normal.     Palpations: Abdomen is soft.  Musculoskeletal:        General: Normal range of motion.     Cervical back: Normal range of motion and neck supple.  Skin:    General: Skin is warm.     Capillary Refill: Capillary refill takes less than 2 seconds.  Neurological:     General: No focal deficit present.     Mental Status: She is alert and oriented to person, place, and time.  Psychiatric:        Mood and Affect: Mood normal.        Behavior: Behavior normal.    ED Results / Procedures / Treatments   Labs (all labs ordered are listed, but only abnormal results are displayed) Labs Reviewed  BASIC METABOLIC PANEL - Abnormal; Notable for the following components:      Result Value   Glucose, Bld 103 (*)    Creatinine, Ser 1.13 (*)    GFR, Estimated 54 (*)    All other components within normal limits  CBC - Abnormal; Notable for the following components:   Platelets 148 (*)    All other components within normal limits  TROPONIN I (HIGH SENSITIVITY)  TROPONIN I (HIGH SENSITIVITY)    EKG EKG Interpretation  Date/Time:  Sunday September 28 2020  14:11:00 EDT Ventricular Rate:  76 PR Interval:  200 QRS Duration: 84 QT Interval:  380 QTC Calculation: 427 R Axis:   -16 Text Interpretation: Normal sinus rhythm Low voltage QRS Cannot rule out Anterior infarct , age undetermined Abnormal ECG No significant change since last tracing Confirmed by Wandra Arthurs ([id number]) on 09/28/2020 3:49:13 PM  Radiology DG Chest 2 View  Result Date: 09/28/2020 CLINICAL DATA:  Chest pain. EXAM: CHEST - 2 VIEW COMPARISON:  March 02, 2019 FINDINGS: Cardiomediastinal silhouette is normal. Mediastinal contours appear intact. There is no evidence of focal airspace consolidation, pleural effusion or pneumothorax. Osseous structures are without acute abnormality. Postsurgical changes in the right thorax. IMPRESSION: No active cardiopulmonary disease. Electronically Signed   By: Fidela Salisbury M.D.   On: 09/28/2020 14:53    Procedures Procedures   Medications Ordered in ED Medications - No data to display  ED Course  I have reviewed the triage vital signs and the nursing notes.  Pertinent labs & imaging results that were available during my care of the patient were reviewed by me and considered in my medical decision making (see chart for details).    MDM Rules/Calculators/A&P  NADINA FOMBY is a 65 y.o. female here presenting with chest pain.  Patient has left-sided chest pain.  Appears atypical for ACS and I doubt PE.  Plan to get troponin x2, CBC and BMP and chest x-ray  6:10 PM Trop neg x 2.  I recommend that she follows up with cardiology if she has persistent pain to get a stress test.   Final Clinical Impression(s) / ED Diagnoses Final diagnoses:  None    Rx / DC Orders ED Discharge Orders     None        Drenda Freeze, MD 09/28/20 1811

## 2020-09-28 NOTE — Discharge Instructions (Addendum)
See your doctor for follow up   Consider following up with cardiology if you have pain and you may need another stress test  Return to ER if you have worse chest pain, trouble breathing

## 2020-09-28 NOTE — ED Triage Notes (Signed)
Patient from home, complaint of CP that started at 1215 today.

## 2020-10-06 ENCOUNTER — Ambulatory Visit (INDEPENDENT_AMBULATORY_CARE_PROVIDER_SITE_OTHER): Payer: Medicare Other | Admitting: Family Medicine

## 2020-10-06 ENCOUNTER — Encounter: Payer: Self-pay | Admitting: Family Medicine

## 2020-10-06 ENCOUNTER — Other Ambulatory Visit: Payer: Self-pay

## 2020-10-06 VITALS — BP 128/78 | HR 74 | Temp 98.3°F | Resp 16 | Ht 63.5 in | Wt 238.0 lb

## 2020-10-06 DIAGNOSIS — R000 Tachycardia, unspecified: Secondary | ICD-10-CM

## 2020-10-06 DIAGNOSIS — R002 Palpitations: Secondary | ICD-10-CM

## 2020-10-06 DIAGNOSIS — R079 Chest pain, unspecified: Secondary | ICD-10-CM

## 2020-10-06 MED ORDER — METOPROLOL SUCCINATE ER 25 MG PO TB24: 25.0000 mg | ORAL_TABLET | Freq: Every day | ORAL | 3 refills | Status: DC

## 2020-10-06 NOTE — Progress Notes (Signed)
Subjective:    Patient ID: Amanda Hoover, female    DOB: Dec 12, 1955, 65 y.o.   MRN: [id number]  HPI  Patient was seen in the emergency room on July 17.  At that time she was having chest pain.  Patient states that she has had chest pain almost on a daily basis since that time.  The pain is located substernally.  There is no association to exercise.  However she has noticed increasing dyspnea on exertion separate from the chest pain.  Position does not make the pain worse.  I am unable to reproduce the pain with palpation.  She denies any pleurisy or hemoptysis or fever or chills.  Food does not make the pain worse although she does have severe heartburn and a history of severe acid reflux.  She denies any melena or hematochezia.  The pain does not move to the right upper quadrant and she is nontender in her abdomen today on exam.  In the emergency room, chest x-ray was normal.  2 sets of troponins were negative.  EKGs were normal.  They recommended outpatient cardiology evaluation.  The patient states that also, she has noticed that her heart will start racing for no reason per tickly first thing in the morning.  She describes it lasting a few minutes and then it resolved spontaneously.  She makes it sound like SVT the way she describes it however she has not counted her heart rate.  She can feel it beating rapidly but regular.  This will occur for no reason.  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   Breast cancer, right ([place])    ER 0 PR0 LN neg.   Cancer [facility])    Chronic headaches    Chronic ITP (idiopathic thrombocytopenia) (HCC)    mild thrombocytopenia, normal spleen on [place] 11/18, mild cirrhosis   Cirrhosis ([place])    Dental crowns present    Depression    no current meds.   Depression    Fatty liver     GERD (gastroesophageal reflux disease)    Headache(784.0)    stress, sinus headaches   Heart murmur    Hiatal hernia    Hiatal hernia    History of colon polyps    History of esophageal stricture    has had esophageal dilation x 1   History of migraine    History of radiation therapy 03/28/2012-05/17/2012   60.4 gray to right breast   Hyperlipidemia    Hypothyroid    Multiple environmental allergies    Multiple food allergies    Osteopenia    Osteopenia    Osteoporosis    Palpitations    occasional; worse with ingestion of caffeine; no cardiologist   Personal history of radiation therapy 2013   Right Breast Cancer   Rash 01/06/2012   right elbow   Rhinitis, allergic    rhinitis   Thyroid disease    Past Surgical History:  Procedure Laterality Date   BREAST LUMPECTOMY Right 2013   COLONOSCOPY     COLONOSCOPY W/ POLYPECTOMY     ESOPHAGOGASTRODUODENOSCOPY     MASTECTOMY Right 2013   partial mast   PARTIAL MASTECTOMY WITH NEEDLE LOCALIZATION AND AXILLARY SENTINEL LYMPH NODE BX  01/12/2012   Procedure: PARTIAL MASTECTOMY WITH  NEEDLE LOCALIZATION AND AXILLARY SENTINEL LYMPH NODE BX;  Surgeon: Adin Hector, MD;  Location: [facility];  Service: General;  Laterality: Right;   RE-EXCISION OF BREAST CANCER,SUPERIOR MARGINS  02/15/2012   Procedure: RE-EXCISION OF BREAST CANCER,SUPERIOR MARGINS;  Surgeon: Adin Hector, MD;  Location: [facility];  Service: General;  Laterality: Right;  Re-excision of right lumpectomy, close posterior margin.   Current Outpatient Medications on File Prior to Visit  Medication Sig Dispense Refill   Ascorbic Acid (VITAMIN C ER PO) Take by mouth.     Cholecalciferol (D3-1000 PO) Take by mouth.     EPINEPHrine 0.3 mg/0.3 mL IJ SOAJ injection Inject 0.3 mg into the muscle as needed for anaphylaxis. 2 each 1   famotidine (PEPCID) 40 MG tablet Take 1 tablet (40 mg total) by mouth daily. 30 tablet 11   levocetirizine  (XYZAL) 5 MG tablet Take 5 mg by mouth every evening.      levothyroxine (SYNTHROID) 50 MCG tablet TAKE 1 TABLET(50 MCG) BY MOUTH DAILY 90 tablet 3   Methylcobalamin (B12-ACTIVE PO) Take 1 tablet by mouth.     Multiple Vitamin (MULTIVITAMIN) tablet Take 1 tablet by mouth daily.     pantoprazole (PROTONIX) 40 MG tablet TAKE 1 TABLET(40 MG) BY MOUTH DAILY 90 tablet 3   No current facility-administered medications on file prior to visit.   Allergies  Allergen Reactions   Macadamia Nut Oil Anaphylaxis   Other Swelling    POPCORN:  SWELLING THROAT   Peanut-Containing Drug Products Swelling and Other (See Comments)    THROAT TIGHTNESS; ALSO WALNUTS   Tomato Swelling    SWELLING UVULA   Adhesive [Tape] Other (See Comments)    BLISTERS   Flour Other (See Comments)    SNEEZING   Penicillins Hives   Hydrocodone Hives   Oxycodone    Sulfa Antibiotics Nausea Only   Vicodin [Hydrocodone-Acetaminophen] Swelling    Pt had flushing, then itching and swelling of lips   Eggs Or Egg-Derived Products Other (See Comments)    POSITIVE ON ALLERGY TEST, BUT EATS EGGS WITHOUT PROBLEM   Soap Other (See Comments)    FRAGRANCES (SOAP/LOTION/PERFUME):  HEADACHE, RUNNY NOSE   Tramadol Nausea And Vomiting   Social History   Socioeconomic History   Marital status: Married    Spouse name: Not on file   Number of children: 4   Years of education: Not on file   Highest education level: Not on file  Occupational History    Employer: [employer]    Comment: Teacher, English as a foreign language for [employer]  Tobacco Use   Smoking status: Former    Packs/day: 0.50    Years: 8.00    Pack years: 4.00    Types: Cigarettes    Quit date: 2006    Years since quitting: 16.5   Smokeless tobacco: Never  Vaping Use   Vaping Use: Never used  Substance and Sexual Activity   Alcohol use: No   Drug use: No    Comment: quit smoking 11 years ago   Sexual activity: Yes    Birth control/protection: Post-menopausal  Other Topics  Concern   Not on file  Social History Narrative   Not on file   Social Determinants of Health   Financial Resource Strain: Not on file  Food Insecurity: Not on file  Transportation Needs: Not on file  Physical Activity: Not on file  Stress: Not on file  Social Connections: Not on file  Intimate  Partner Violence: Not on file      Review of Systems     Objective:   Physical Exam Vitals reviewed.  Constitutional:      General: She is not in acute distress.    Appearance: She is not diaphoretic.  HENT:     Right Ear: Tympanic membrane, ear canal and external ear normal.     Left Ear: Tympanic membrane, ear canal and external ear normal.     Nose: No mucosal edema, congestion or rhinorrhea.     Right Sinus: No maxillary sinus tenderness or frontal sinus tenderness.     Left Sinus: No maxillary sinus tenderness or frontal sinus tenderness.     Mouth/Throat:     Pharynx: No oropharyngeal exudate.  Eyes:     Conjunctiva/sclera: Conjunctivae normal.  Neck:     Thyroid: No thyroid mass, thyromegaly or thyroid tenderness.   Cardiovascular:     Rate and Rhythm: Normal rate and regular rhythm.     Heart sounds: Normal heart sounds. No murmur heard.   No friction rub. No gallop.  Pulmonary:     Effort: Pulmonary effort is normal. No respiratory distress.     Breath sounds: Normal breath sounds. No stridor. No wheezing or rales.  Musculoskeletal:     Cervical back: Full passive range of motion without pain, normal range of motion and neck supple. No edema or erythema. No pain with movement or muscular tenderness. Normal range of motion.  Lymphadenopathy:     Cervical: No cervical adenopathy.          Assessment & Plan:  Palpitations - Plan: CBC with Differential/Platelet, COMPLETE METABOLIC PANEL WITH GFR, TSH  Tachycardia  Chest pain, unspecified type Chest pain is very atypical.  However her history does not provide any clues as to the cause of the chest pain.  She  has had it now for approximately a week on a daily basis therefore I do not feel that this is likely cardiac ischemia.  However I am concerned by her tachycardia in the morning and her palpitations that the patient could be having some type of cardiac arrhythmia such as SVT or atrial fibrillation.  I will start the patient on Toprol-XL 25 mg p.o. daily in an effort to try to control or prevent some of the palpitations she is experiencing.  I will also increase Protonix to 40 mg twice a day in case acid reflux is playing a role in the atypical chest discomfort.  I will try to expedite her cardiology consultation as I feel that she would benefit from a stress test and a cardiac monitor

## 2020-10-07 LAB — CBC WITH DIFFERENTIAL/PLATELET
Absolute Monocytes: 534 cells/uL (ref 200–950)
Basophils Absolute: 50 cells/uL (ref 0–200)
Basophils Relative: 0.9 %
Eosinophils Absolute: 182 cells/uL (ref 15–500)
Eosinophils Relative: 3.3 %
HCT: 41.1 % (ref 35.0–45.0)
Hemoglobin: 13.6 g/dL (ref 11.7–15.5)
Lymphs Abs: 1595 cells/uL (ref 850–3900)
MCH: 31.8 pg (ref 27.0–33.0)
MCHC: 33.1 g/dL (ref 32.0–36.0)
MCV: 96 fL (ref 80.0–100.0)
MPV: 12.2 fL (ref 7.5–12.5)
Monocytes Relative: 9.7 %
Neutro Abs: 3141 cells/uL (ref 1500–7800)
Neutrophils Relative %: 57.1 %
Platelets: 132 10*3/uL — ABNORMAL LOW (ref 140–400)
RBC: 4.28 10*6/uL (ref 3.80–5.10)
RDW: 12.9 % (ref 11.0–15.0)
Total Lymphocyte: 29 %
WBC: 5.5 10*3/uL (ref 3.8–10.8)

## 2020-10-07 LAB — COMPLETE METABOLIC PANEL WITH GFR
AG Ratio: 1.5 (calc) (ref 1.0–2.5)
ALT: 25 U/L (ref 6–29)
AST: 31 U/L (ref 10–35)
Albumin: 4.3 g/dL (ref 3.6–5.1)
Alkaline phosphatase (APISO): 91 U/L (ref 37–153)
BUN: 14 mg/dL (ref 7–25)
CO2: 20 mmol/L (ref 20–32)
Calcium: 9.7 mg/dL (ref 8.6–10.4)
Chloride: 105 mmol/L (ref 98–110)
Creat: 0.92 mg/dL (ref 0.50–1.05)
Globulin: 2.8 g/dL (calc) (ref 1.9–3.7)
Glucose, Bld: 88 mg/dL (ref 65–99)
Potassium: 4.5 mmol/L (ref 3.5–5.3)
Sodium: 140 mmol/L (ref 135–146)
Total Bilirubin: 0.6 mg/dL (ref 0.2–1.2)
Total Protein: 7.1 g/dL (ref 6.1–8.1)
eGFR: 69 mL/min/{1.73_m2} (ref >=60)

## 2020-10-07 LAB — TSH: TSH: 10.31 mIU/L — ABNORMAL HIGH (ref 0.40–4.50)

## 2020-10-08 ENCOUNTER — Other Ambulatory Visit: Payer: Self-pay | Admitting: *Deleted

## 2020-10-08 DIAGNOSIS — E038 Other specified hypothyroidism: Secondary | ICD-10-CM

## 2020-10-08 MED ORDER — LEVOTHYROXINE SODIUM 75 MCG PO TABS: ORAL_TABLET | ORAL | 3 refills | Status: DC
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
## 2020-10-28 ENCOUNTER — Encounter: Payer: Self-pay | Admitting: Cardiology

## 2020-10-28 NOTE — Progress Notes (Signed)
Cardiology Office Note  Date: 10/29/2020   ID: Halo, Cardinali [birth year], MRN [id number]  PCP:  Susy Frizzle, MD  Cardiologist:  Rozann Lesches, MD Electrophysiologist:  None   Chief Complaint  Patient presents with   Episode of chest pain, fatigue     History of Present Illness: Amanda Hoover is a 65 y.o. female referred for cardiology consultation by Dr. Dennard Schaumann for evaluation of chest pain and palpitations.  I reviewed his note from July 25 as well as prior ER visit from July 17.  She tells me that she experienced a 4 to 5-hour episode of intense chest pressure leading to her ER encounter in July, states that the symptoms resolved after that and have not recurred.  She cannot identify any specific precipitant.  At baseline she does report lack of stamina, states that she has gained a fair amount of weight since no longer exercising regularly.  She was limited by plantar fasciitis, but this has resolved.  In terms of palpitations, she mainly describes a feeling of rapid heart rate when she gets out of bed to go to the bathroom sometimes.  Less noticeable during the daytime.  She denies any sudden sense of rapid heartbeat or any associated dizziness/syncope.  She was prescribed Toprol-XL 25 mg daily by Dr. Dennard Schaumann, only took it a few days and then stopped it.  She did undergo remote ischemic testing with low risk Myoview back in 2010 showing no evidence of ischemia and LVEF 65%.  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   Breast cancer, right ([facility])    ER 0 PR0 LN neg.   Chronic headaches    Chronic ITP (idiopathic thrombocytopenia) ([facility])    mild thrombocytopenia, normal spleen on [place] 11/18, mild cirrhosis   Cirrhosis ([place])    Dental crowns present    Depression    Fatty liver    GERD (gastroesophageal reflux disease)    Heart murmur     Hiatal hernia    History of colon polyps    History of esophageal stricture    Status post esophageal dilatation   History of radiation therapy 03/28/2012-05/17/2012   60.4 gray to right breast   Hyperlipidemia    Hypothyroid    Multiple environmental allergies    Multiple food allergies    Osteopenia    Osteoporosis    Palpitations    Personal history of radiation therapy 2013   Right Breast Cancer   Rash 01/06/2012   right elbow   Rhinitis, allergic    rhinitis    Past Surgical History:  Procedure Laterality Date   BREAST LUMPECTOMY Right 2013   COLONOSCOPY     COLONOSCOPY W/ POLYPECTOMY     ESOPHAGOGASTRODUODENOSCOPY     MASTECTOMY Right 2013   partial mast   PARTIAL MASTECTOMY WITH NEEDLE LOCALIZATION AND AXILLARY SENTINEL LYMPH NODE BX  01/12/2012   Procedure: PARTIAL MASTECTOMY WITH NEEDLE LOCALIZATION AND AXILLARY SENTINEL LYMPH NODE BX;  Surgeon: Adin Hector, MD;  Location: [facility];  Service: General;  Laterality: Right;   RE-EXCISION OF BREAST CANCER,SUPERIOR MARGINS  02/15/2012   Procedure: RE-EXCISION OF BREAST CANCER,SUPERIOR MARGINS;  Surgeon: Adin Hector, MD;  Location: [facility];  Service: General;  Laterality: Right;  Re-excision of right lumpectomy, close posterior margin.    Current Outpatient Medications  Medication Sig Dispense Refill   Ascorbic Acid (VITAMIN C ER PO) Take by mouth.     Cholecalciferol (D3-1000 PO) Take by mouth.     EPINEPHrine 0.3 mg/0.3 mL IJ SOAJ injection Inject 0.3 mg into the muscle as needed for anaphylaxis. 2 each 1   famotidine (PEPCID) 40 MG tablet Take 1 tablet (40 mg total) by mouth daily. 30 tablet 11   levothyroxine (SYNTHROID) 75 MCG tablet TAKE 1 TABLET (75 MCG) BY MOUTH DAILY 30 tablet 3   Methylcobalamin (B12-ACTIVE PO) Take 1 tablet by mouth.     Multiple Vitamin (MULTIVITAMIN) tablet Take 1 tablet by mouth daily.     pantoprazole (PROTONIX) 40 MG tablet TAKE 1 TABLET(40 MG) BY  MOUTH DAILY 90 tablet 3   levocetirizine (XYZAL) 5 MG tablet Take 5 mg by mouth every evening.      No current facility-administered medications for this visit.   Allergies:  Macadamia nut oil, Other, Peanut-containing drug products, Tomato, Adhesive [tape], Flour, Penicillins, Hydrocodone, Oxycodone, Sulfa antibiotics, Vicodin [hydrocodone-acetaminophen], Eggs or egg-derived products, Soap, and Tramadol   Social History: The patient  reports that she quit smoking about 16 years ago. Her smoking use included cigarettes. She has a 4.00 pack-year smoking history. She has never used smokeless tobacco. She reports that she does not drink alcohol and does not use drugs.   Family History: The patient's family history includes Breast cancer in her paternal aunt and another family member; Breast cancer (age of onset: 43) in her paternal grandmother; Cancer in her father and maternal uncle; Crohn's disease in her mother; Depression in her mother; Diabetes in her maternal grandmother and paternal grandmother; Esophageal cancer (age of onset: 91) in her father; Hyperlipidemia in her mother; Hypertension in her mother; Irritable bowel syndrome in her mother; Liver disease in an other family member; Melanoma in her cousin; Stroke in her mother; Thyroid disease in her mother.   ROS: No orthopnea or PND.  Physical Exam: VS:  BP 134/80   Pulse 74   Ht '5\' 4"'$  (1.626 m)   Wt 237 lb (107.5 kg)   SpO2 98%   BMI 40.68 kg/m , BMI Body mass index is 40.68 kg/m.  Wt Readings from Last 3 Encounters:  10/29/20 237 lb (107.5 kg)  10/06/20 238 lb (108 kg)  06/04/20 239 lb (108.4 kg)    General: Patient appears comfortable at rest. HEENT: Conjunctiva and lids normal, wearing a mask. Neck: Supple, no elevated JVP or carotid bruits, no thyromegaly. Lungs: Clear to auscultation, nonlabored breathing at rest. Cardiac: Regular rate and rhythm, no S3 or significant systolic murmur, no pericardial rub. Abdomen: Soft,  nontender, bowel sounds present. Extremities: No pitting edema, distal pulses 2+. Skin: Warm and dry. Musculoskeletal: No kyphosis. Neuropsychiatric: Alert and oriented x3, affect grossly appropriate.  ECG:  An ECG dated 09/28/2020 was personally reviewed today and demonstrated:  Sinus rhythm with decreased R wave progression, rule out old anterior infarct pattern.  Recent Labwork: 10/06/2020: ALT 25; AST 31; BUN 14; Creat 0.92; Hemoglobin 13.6; Platelets 132; Potassium 4.5; Sodium 140; TSH 10.31     Component Value Date/Time   CHOL 174 04/23/2019 1245   TRIG 74 04/23/2019 1245   HDL 58 04/23/2019 1245   CHOLHDL 4.1 10/31/2018 0835   VLDL 25 10/10/2014 1111   LDLCALC 102 (H) 04/23/2019 1245   LDLCALC 111 (H) 10/31/2018 0835    Other Studies Reviewed Today:  Chest x-ray 09/28/2020: FINDINGS: Cardiomediastinal silhouette is normal. Mediastinal contours appear intact.  There is no evidence of focal airspace consolidation, pleural effusion or pneumothorax.   Osseous structures are without acute abnormality. Postsurgical changes in the right thorax.   IMPRESSION: No active cardiopulmonary disease.  Assessment and Plan:  1.  Episode of prolonged chest pain as discussed above, also exercise intolerance 65 year old with history of hyperlipidemia, breast cancer with prior right sided radiation treatments, hypothyroidism, and general sense of palpitations at times.  No evidence of ACS by cardiac enzymes at ER encounter in July.  ECG shows poor R wave progression.  Plan is to obtain exercise Myoview for ischemic evaluation.  Would stay off beta-blocker for now.  2.  General sense of palpitations as discussed above, no associated syncope or other high risk features.  Hold off beta-blocker for now and pursue 7-day Zio patch first.  3.  Hypothyroidism, on Synthroid.  Recent TSH 10.31 with follow-up plan per PCP.  Medication Adjustments/Labs and Tests Ordered: Current medicines are  reviewed at length with the patient today.  Concerns regarding medicines are outlined above.   Tests Ordered: Orders Placed This Encounter  Procedures   NM Myocar Multi W/Spect W/Wall Motion / EF     Medication Changes: No orders of the defined types were placed in this encounter.   Disposition:  Follow up  test results.  Signed, Satira Sark, MD, [facility] 10/29/2020 9:23 AM    [facility]. [address] [place], [place] [address] Phone: [phone]; Fax: [phone]

## 2020-10-29 ENCOUNTER — Ambulatory Visit: Payer: Medicare Other | Admitting: Cardiology

## 2020-10-29 ENCOUNTER — Encounter: Payer: Self-pay | Admitting: Cardiology

## 2020-10-29 ENCOUNTER — Other Ambulatory Visit: Payer: Self-pay

## 2020-10-29 ENCOUNTER — Ambulatory Visit (INDEPENDENT_AMBULATORY_CARE_PROVIDER_SITE_OTHER): Payer: Medicare Other

## 2020-10-29 ENCOUNTER — Other Ambulatory Visit: Payer: Self-pay | Admitting: Cardiology

## 2020-10-29 VITALS — BP 134/80 | HR 74 | Ht 64.0 in | Wt 237.0 lb

## 2020-10-29 DIAGNOSIS — R002 Palpitations: Secondary | ICD-10-CM

## 2020-10-29 DIAGNOSIS — R9431 Abnormal electrocardiogram [ECG] [EKG]: Secondary | ICD-10-CM

## 2020-10-29 DIAGNOSIS — R6889 Other general symptoms and signs: Secondary | ICD-10-CM

## 2020-10-29 DIAGNOSIS — R0789 Other chest pain: Secondary | ICD-10-CM | POA: Diagnosis not present

## 2020-10-29 DIAGNOSIS — R079 Chest pain, unspecified: Secondary | ICD-10-CM

## 2020-10-29 NOTE — Patient Instructions (Signed)
Medication Instructions:  Your physician recommends that you continue on your current medications as directed. Please refer to the Current Medication list given to you today.  *If you need a refill on your cardiac medications before your next appointment, please call your pharmacy*   Lab Work: None If you have labs (blood work) drawn today and your tests are completely normal, you will receive your results only by: [facility] (if you have MyChart) OR A paper copy in the mail If you have any lab test that is abnormal or we need to change your treatment, we will call you to review the results.   Testing/Procedures: Exercise Myoview   Follow-Up: At [facility], you and your health needs are our priority.  As part of our continuing mission to provide you with exceptional heart care, we have created designated Provider Care Teams.  These Care Teams include your primary Cardiologist (physician) and Advanced Practice Providers (APPs -  Physician Assistants and Nurse Practitioners) who all work together to provide you with the care you need, when you need it.  We recommend signing up for the patient portal called "MyChart".  Sign up information is provided on this After Visit Summary.  MyChart is used to connect with patients for Virtual Visits (Telemedicine).  Patients are able to view lab/test results, encounter notes, upcoming appointments, etc.  Non-urgent messages can be sent to your provider as well.   To learn more about what you can do with MyChart, go to [email or web address].    Your next appointment:   Follow Up: TO BE DETERMINED   Other Instructions ZIO XT- Long Term Monitor Instructions   Your physician has requested you wear your ZIO patch monitor_______days.   This is a single patch monitor.  [employer] supplies one patch monitor per enrollment.  Additional stickers are not available.   Please do not apply patch if you will be having a Nuclear Stress Test,  Echocardiogram, Cardiac CT, MRI, or Chest Xray during the time frame you would be wearing the monitor. The patch cannot be worn during these tests.  You cannot remove and re-apply the ZIO XT patch monitor.   Your ZIO patch monitor will be sent USPS Priority mail from [facility] directly to your home address. The monitor may also be mailed to a PO BOX if home delivery is not available.   It may take 3-5 days to receive your monitor after you have been enrolled.   Once you have received you monitor, please review enclosed instructions.  Your monitor has already been registered assigning a specific monitor serial # to you.   Applying the monitor   Shave hair from upper left chest.   Hold abrader disc by orange tab.  Rub abrader in 40 strokes over left upper chest as indicated in your monitor instructions.   Clean area with 4 enclosed alcohol pads .  Use all pads to assure are is cleaned thoroughly.  Let dry.   Apply patch as indicated in monitor instructions.  Patch will be place under collarbone on left side of chest with arrow pointing upward.   Rub patch adhesive wings for 2 minutes.Remove white label marked "1".  Remove white label marked "2".  Rub patch adhesive wings for 2 additional minutes.   While looking in a mirror, press and release button in center of patch.  A small green light will flash 3-4 times .  This will be your only indicator the monitor has been turned on.  Do not shower for the first 24 hours.  You may shower after the first 24 hours.   Press button if you feel a symptom. You will hear a small click.  Record Date, Time and Symptom in the Patient Log Book.   When you are ready to remove patch, follow instructions on last 2 pages of Patient Log Book.  Stick patch monitor onto last page of Patient Log Book.   Place Patient Log Book in [place] box.  Use locking tab on box and tape box closed securely.  The [employer] has [employer] on it.  Please  place in mailbox as soon as possible.  Your physician should have your test results approximately 7 days after the monitor has been mailed back to [facility].   Call [place] at [phone] if you have questions regarding your ZIO XT patch monitor.  Call them immediately if you see an orange light blinking on your monitor.   If your monitor falls off in less than 4 days contact our Monitor department at [phone].  If your monitor becomes loose or falls off after 4 days call [employer] at [phone] for suggestions on securing your monitor.

## 2020-11-10 DIAGNOSIS — R002 Palpitations: Secondary | ICD-10-CM | POA: Diagnosis not present

## 2020-11-12 ENCOUNTER — Other Ambulatory Visit: Payer: Self-pay

## 2020-11-12 ENCOUNTER — Ambulatory Visit (HOSPITAL_COMMUNITY)
Admission: RE | Admit: 2020-11-12 | Discharge: 2020-11-12 | Disposition: A | Discharge status: Home / Self Care | Payer: Medicare Other | Source: Ambulatory Visit | Attending: Cardiology | Admitting: Cardiology

## 2020-11-12 ENCOUNTER — Encounter (HOSPITAL_COMMUNITY)
Admission: RE | Admit: 2020-11-12 | Discharge: 2020-11-12 | Disposition: A | Discharge status: Home / Self Care | Payer: Medicare Other | Source: Ambulatory Visit | Attending: Cardiology | Admitting: Cardiology

## 2020-11-12 DIAGNOSIS — R079 Chest pain, unspecified: Secondary | ICD-10-CM | POA: Diagnosis not present

## 2020-11-12 LAB — NM MYOCAR MULTI W/SPECT W/WALL MOTION / EF
Angina Index: 1
Duke Treadmill Score: -1
Estimated workload: 4.6
Exercise duration (min): 3 min
Exercise duration (sec): 0 s
LV dias vol: 59 mL (ref 46–106)
LV sys vol: 15 mL
MPHR: 154 {beats}/min
Nuc Stress EF: 75 %
Peak HR: 153 {beats}/min
Percent HR: 99 %
RATE: 0.3
RPE: 16
Rest HR: 62 {beats}/min
Rest Nuclear Isotope Dose: 10 mCi
SDS: 1
SRS: 1
SSS: 2
ST Depression (mm): 0.5 mm
Stress Nuclear Isotope Dose: 30 mCi
TID: 0.99

## 2020-11-12 MED ORDER — TECHNETIUM TC 99M TETROFOSMIN IV KIT
30.0000 | PACK | Freq: Once | INTRAVENOUS | Status: AC | PRN
  Administered 2020-11-12: 30 via INTRAVENOUS

## 2020-11-12 MED ORDER — REGADENOSON 0.4 MG/5ML IV SOLN
INTRAVENOUS | Status: AC
  Filled 2020-11-12: qty 5

## 2020-11-12 MED ORDER — SODIUM CHLORIDE FLUSH 0.9 % IV SOLN
INTRAVENOUS | Status: AC
  Administered 2020-11-12: 10 mL via INTRAVENOUS
  Filled 2020-11-12: qty 10

## 2020-11-12 MED ORDER — TECHNETIUM TC 99M TETROFOSMIN IV KIT
10.0000 | PACK | Freq: Once | INTRAVENOUS | Status: AC | PRN
  Administered 2020-11-12: 10 via INTRAVENOUS

## 2020-11-13 ENCOUNTER — Other Ambulatory Visit: Payer: Self-pay | Admitting: Cardiology

## 2020-11-13 ENCOUNTER — Ambulatory Visit (INDEPENDENT_AMBULATORY_CARE_PROVIDER_SITE_OTHER): Payer: Medicare Other | Admitting: Cardiology

## 2020-11-13 ENCOUNTER — Telehealth: Payer: Self-pay | Admitting: Cardiology

## 2020-11-13 ENCOUNTER — Telehealth: Payer: Self-pay

## 2020-11-13 ENCOUNTER — Other Ambulatory Visit (HOSPITAL_COMMUNITY)
Admission: RE | Admit: 2020-11-13 | Discharge: 2020-11-13 | Disposition: A | Discharge status: Home / Self Care | Payer: Medicare Other | Source: Ambulatory Visit | Attending: Cardiology | Admitting: Cardiology

## 2020-11-13 ENCOUNTER — Encounter: Payer: Self-pay | Admitting: Cardiology

## 2020-11-13 VITALS — BP 124/76 | HR 68 | Ht 64.0 in | Wt 236.0 lb

## 2020-11-13 DIAGNOSIS — R6889 Other general symptoms and signs: Secondary | ICD-10-CM | POA: Diagnosis not present

## 2020-11-13 DIAGNOSIS — Z01818 Encounter for other preprocedural examination: Secondary | ICD-10-CM

## 2020-11-13 DIAGNOSIS — R9439 Abnormal result of other cardiovascular function study: Secondary | ICD-10-CM

## 2020-11-13 DIAGNOSIS — Z87898 Personal history of other specified conditions: Secondary | ICD-10-CM

## 2020-11-13 LAB — CBC
HCT: 41.4 % (ref 36.0–46.0)
Hemoglobin: 13.9 g/dL (ref 12.0–15.0)
MCH: 32.3 pg (ref 26.0–34.0)
MCHC: 33.6 g/dL (ref 30.0–36.0)
MCV: 96.1 fL (ref 80.0–100.0)
Platelets: 131 10*3/uL — ABNORMAL LOW (ref 150–400)
RBC: 4.31 MIL/uL (ref 3.87–5.11)
RDW: 13.7 % (ref 11.5–15.5)
WBC: 4.5 10*3/uL (ref 4.0–10.5)
nRBC: 0 % (ref 0.0–0.2)

## 2020-11-13 LAB — BASIC METABOLIC PANEL
Anion gap: 8 (ref 5–15)
BUN: 17 mg/dL (ref 8–23)
CO2: 24 mmol/L (ref 22–32)
Calcium: 9.1 mg/dL (ref 8.9–10.3)
Chloride: 107 mmol/L (ref 98–111)
Creatinine, Ser: 0.87 mg/dL (ref 0.44–1.00)
GFR, Estimated: >60 mL/min (ref >60)
Glucose, Bld: 109 mg/dL — ABNORMAL HIGH (ref 70–99)
Potassium: 3.6 mmol/L (ref 3.5–5.1)
Sodium: 139 mmol/L (ref 135–145)

## 2020-11-13 MED ORDER — SODIUM CHLORIDE 0.9% FLUSH: 3.0000 mL | Freq: Two times a day (BID) | INTRAVENOUS | Status: DC

## 2020-11-13 NOTE — Telephone Encounter (Deleted)
Patient Consent for Virtual Visit   { TIP  Anything in RED or BLUE will delete when you sign your note. There is no need to delete it or select it by pressing F2.   Please read everything in BLUE to the patient.        :[id number] { PLEASE READ THE FOLLOWING TO THE PATIENT. Then, scroll to the question in red below. If the patient has questions about consent, refer to and read the consent below,    CONSENT FOR VIRTUAL VISIT.  Ms. Boruta, you are scheduled for a virtual visit with your provider today.  Just as we do with appointments in the office, we must obtain your consent to participate.  Your consent will be active for this visit and any virtual visit you may have with one of our providers in the next 365 days.  If you have a MyChart account, I can also send a copy of this consent to you electronically.  All virtual visits are billed to your insurance company just like a traditional visit in the office.  As this is a virtual visit, video technology does not allow for your provider to perform a traditional examination.  This may limit your provider's ability to fully assess your condition.  If your provider identifies any concerns that need to be evaluated in person or the need to arrange testing such as labs, EKG, etc, we will make arrangements to do so.  Although advances in technology are sophisticated, we cannot ensure that it will always work on either your end or our end.  If the connection with a video visit is poor, we may have to switch to a telephone visit.  With either a video or telephone visit, we are not always able to ensure that we have a secure connection.   I need to obtain your verbal consent now.   Are you willing to proceed with your visit today?        :[id number]  {  Did the patient verbally consent to the visit?  Place your cursor on the next line. Then, press F2 or Fn plus F2 to select the list below.  Then, choose YES or NO.  :[id number]   {Click here.  Press F2 and  choose YES or NO                  :[id number]   CONSENT FOR VIRTUAL VISIT FOR:  [place]  By participating in this virtual visit I agree to the following:  I hereby voluntarily request, consent and authorize [facility] and its employed or contracted physicians, physician assistants, nurse practitioners or other licensed health care professionals (the Practitioner), to provide me with telemedicine health care services (the "Services") as deemed necessary by the treating Practitioner. I acknowledge and consent to receive the Services by the Practitioner via telemedicine. I understand that the telemedicine visit will involve communicating with the Practitioner through live audiovisual communication technology and the disclosure of certain medical information by electronic transmission. I acknowledge that I have been given the opportunity to request an in-person assessment or other available alternative prior to the telemedicine visit and am voluntarily participating in the telemedicine visit.  I understand that I have the right to withhold or withdraw my consent to the use of telemedicine in the course of my care at any time, without affecting my right to future care or treatment, and that the Practitioner or I may terminate the telemedicine visit at any time. I  understand that I have the right to inspect all information obtained and/or recorded in the course of the telemedicine visit and may receive copies of available information for a reasonable fee.  I understand that some of the potential risks of receiving the Services via telemedicine include:  Delay or interruption in medical evaluation due to technological equipment failure or disruption; Information transmitted may not be sufficient (e.g. poor resolution of images) to allow for appropriate medical decision making by the Practitioner; and/or  In rare instances, security protocols could fail, causing a breach of personal health  information.  Furthermore, I acknowledge that it is my responsibility to provide information about my medical history, conditions and care that is complete and accurate to the best of my ability. I acknowledge that Practitioner's advice, recommendations, and/or decision may be based on factors not within their control, such as incomplete or inaccurate data provided by me or distortions of diagnostic images or specimens that may result from electronic transmissions. I understand that the practice of medicine is not an exact science and that Practitioner makes no warranties or guarantees regarding treatment outcomes. I acknowledge that a copy of this consent can be made available to me via my patient portal ([place]), or I can request a printed copy by calling the office of [facility].    I understand that my insurance will be billed for this visit.   I have read or had this consent read to me. I understand the contents of this consent, which adequately explains the benefits and risks of the Services being provided via telemedicine.  I have been provided ample opportunity to ask questions regarding this consent and the Services and have had my questions answered to my satisfaction. I give my informed consent for the services to be provided through the use of telemedicine in my medical care

## 2020-11-13 NOTE — Telephone Encounter (Signed)
Patient will have a VV today with Dr.McDowell to obtain consent. Left heart cath scheduled for Wednesday, 11/19/20, arrive at 0630 for a 0830 cath with Dr.Kelly.Patient wil have bmet and cbc done today at [facility].

## 2020-11-13 NOTE — Telephone Encounter (Signed)
  Patient Consent for Virtual Visit      CONSENT FOR VIRTUAL VISIT FOR:  Amanda Hoover  By participating in this virtual visit I agree to the following:  I hereby voluntarily request, consent and authorize [facility] and its employed or contracted physicians, physician assistants, nurse practitioners or other licensed health care professionals (the Practitioner), to provide me with telemedicine health care services (the "Services") as deemed necessary by the treating Practitioner. I acknowledge and consent to receive the Services by the Practitioner via telemedicine. I understand that the telemedicine visit will involve communicating with the Practitioner through live audiovisual communication technology and the disclosure of certain medical information by electronic transmission. I acknowledge that I have been given the opportunity to request an in-person assessment or other available alternative prior to the telemedicine visit and am voluntarily participating in the telemedicine visit.  I understand that I have the right to withhold or withdraw my consent to the use of telemedicine in the course of my care at any time, without affecting my right to future care or treatment, and that the Practitioner or I may terminate the telemedicine visit at any time. I understand that I have the right to inspect all information obtained and/or recorded in the course of the telemedicine visit and may receive copies of available information for a reasonable fee.  I understand that some of the potential risks of receiving the Services via telemedicine include:  Delay or interruption in medical evaluation due to technological equipment failure or disruption; Information transmitted may not be sufficient (e.g. poor resolution of images) to allow for appropriate medical decision making by the Practitioner; and/or  In rare instances, security protocols could fail, causing a breach of personal health  information.  Furthermore, I acknowledge that it is my responsibility to provide information about my medical history, conditions and care that is complete and accurate to the best of my ability. I acknowledge that Practitioner's advice, recommendations, and/or decision may be based on factors not within their control, such as incomplete or inaccurate data provided by me or distortions of diagnostic images or specimens that may result from electronic transmissions. I understand that the practice of medicine is not an exact science and that Practitioner makes no warranties or guarantees regarding treatment outcomes. I acknowledge that a copy of this consent can be made available to me via my patient portal ([place]), or I can request a printed copy by calling the office of [facility].    I understand that my insurance will be billed for this visit.   I have read or had this consent read to me. I understand the contents of this consent, which adequately explains the benefits and risks of the Services being provided via telemedicine.  I have been provided ample opportunity to ask questions regarding this consent and the Services and have had my questions answered to my satisfaction. I give my informed consent for the services to be provided through the use of telemedicine in my medical care

## 2020-11-13 NOTE — H&P (View-Only) (Signed)
Virtual Visit via Telephone Note   This visit type was conducted due to national recommendations for restrictions regarding the COVID-19 Pandemic (e.g. social distancing) in an effort to limit this patient's exposure and mitigate transmission in our community.  Due to her co-morbid illnesses, this patient is at least at moderate risk for complications without adequate follow up.  This format is felt to be most appropriate for this patient at this time.  The patient did not have access to video technology/had technical difficulties with video requiring transitioning to audio format only (telephone).  All issues noted in this document were discussed and addressed.  No physical exam could be performed with this format.  Please refer to the patient's chart for her  consent to telehealth for [facility].    Date:  11/13/2020   ID:  Amanda Hoover, DOB Mar 04, 1956, MRN [id number] The patient was identified using 2 identifiers.  Patient Location: Home Provider Location: Office/Clinic   PCP:  Susy Frizzle, MD   [facility] Providers Cardiologist:  Rozann Lesches, MD {   Evaluation Performed:  Follow-Up Visit  Chief Complaint:  Discussion of cardiac catheterization.  History of Present Illness:    Amanda Hoover is a 65 y.o. female seen recently in consultation on August 17 for evaluation of exercise intolerance and a history of prolonged chest discomfort.  She had been evaluated in the ER back in July with normal cardiac enzymes.  Ischemic testing was arranged.  Exercise Myoview done on August 31 was abnormal as discussed below.  Overall intermediate risk based on Duke treadmill score with limited exercise time and equivocal ST segment changes in the inferior leads.  There were partially reversible apical anterior and basal anterolateral defects on perfusion imaging in the setting of breast attenuation, unable to exclude mild ischemia although variable soft tissue attenuation also  possible.  We discussed these changes today and went over the risks and benefits of a diagnostic cardiac catheterization to clarify coronary anatomy and determine if any revascularization options need to be considered.  The patient understands that risks include but are not limited to stroke (1 in 1000), death (1 in 7), kidney failure [usually temporary] (1 in 500), bleeding (1 in 200), allergic reaction [possibly serious] (1 in 200).  Lab work obtained today as well and noted below.  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   Breast cancer, right ([place])    ER 0 PR0 LN neg.   Chronic headaches    Chronic ITP (idiopathic thrombocytopenia) (HCC)    mild thrombocytopenia, normal spleen on [place] 11/18, mild cirrhosis   Cirrhosis ([place])    Dental crowns present    Depression    Fatty liver    GERD (gastroesophageal reflux disease)    Heart murmur    Hiatal hernia    History of colon polyps    History of esophageal stricture    Status post esophageal dilatation   History of radiation therapy 03/28/2012-05/17/2012   60.4 gray to right breast   Hyperlipidemia    Hypothyroid    Multiple environmental allergies    Multiple food allergies    Osteopenia    Osteoporosis    Palpitations    Personal history of radiation therapy 2013   Right Breast Cancer   Rash 01/06/2012   right elbow   Rhinitis, allergic  rhinitis   Past Surgical History:  Procedure Laterality Date   BREAST LUMPECTOMY Right 2013   COLONOSCOPY     COLONOSCOPY W/ POLYPECTOMY     ESOPHAGOGASTRODUODENOSCOPY     MASTECTOMY Right 2013   partial mast   PARTIAL MASTECTOMY WITH NEEDLE LOCALIZATION AND AXILLARY SENTINEL LYMPH NODE BX  01/12/2012   Procedure: PARTIAL MASTECTOMY WITH NEEDLE LOCALIZATION AND AXILLARY SENTINEL LYMPH NODE BX;  Surgeon: Adin Hector, MD;  Location: [place];  Service: General;  Laterality: Right;   RE-EXCISION OF BREAST CANCER,SUPERIOR MARGINS  02/15/2012   Procedure: RE-EXCISION OF BREAST CANCER,SUPERIOR MARGINS;  Surgeon: Adin Hector, MD;  Location: [place];  Service: General;  Laterality: Right;  Re-excision of right lumpectomy, close posterior margin.     Current Meds  Medication Sig   Ascorbic Acid (VITAMIN C ER PO) Take by mouth.   Cholecalciferol (D3-1000 PO) Take by mouth.   EPINEPHrine 0.3 mg/0.3 mL IJ SOAJ injection Inject 0.3 mg into the muscle as needed for anaphylaxis.   famotidine (PEPCID) 40 MG tablet Take 1 tablet (40 mg total) by mouth daily.   levothyroxine (SYNTHROID) 75 MCG tablet TAKE 1 TABLET (75 MCG) BY MOUTH DAILY   Methylcobalamin (B12-ACTIVE PO) Take 1 tablet by mouth.   Multiple Vitamin (MULTIVITAMIN) tablet Take 1 tablet by mouth daily.   pantoprazole (PROTONIX) 40 MG tablet TAKE 1 TABLET(40 MG) BY MOUTH DAILY     Allergies:   Macadamia nut oil, Other, Peanut-containing drug products, Tomato, Adhesive [tape], Flour, Penicillins, Hydrocodone, Oxycodone, Sulfa antibiotics, Vicodin [hydrocodone-acetaminophen], Eggs or egg-derived products, Soap, and Tramadol   Social History   Tobacco Use   Smoking status: Former    Packs/day: 0.50    Years: 8.00    Pack years: 4.00    Types: Cigarettes    Quit date: 2006    Years since quitting: 16.6   Smokeless tobacco: Never  Vaping Use   Vaping Use: Never used  Substance Use Topics   Alcohol use: No   Drug use: No    Comment: quit smoking 11 years ago     Family Hx: The patient's family history includes Breast cancer in her paternal aunt and another family member; Breast cancer (age of onset: 74) in her paternal grandmother; Cancer in her father and maternal uncle; Crohn's disease in her mother; Depression in her mother; Diabetes in her maternal grandmother and paternal grandmother; Esophageal cancer (age of onset: 25) in her father;  Hyperlipidemia in her mother; Hypertension in her mother; Irritable bowel syndrome in her mother; Liver disease in an other family member; Melanoma in her cousin; Stroke in her mother; Thyroid disease in her mother. There is no history of Colon cancer, Rectal cancer, or Stomach cancer.  ROS:   Please see the history of present illness.    All other systems reviewed and are negative.   Prior CV studies:   The following studies were reviewed today:  Exercise Myoview 11/12/2020:   Findings are consistent with ischemia. Findings are equivocal. The study is intermediate risk. Duke treadmill score of -3.5.   0.5 mm of up sloping ST depression (II, III and aVF) was noted. Ventricular couplet noted at the end of exercise, otherwise no significant arrhythmias.   Defect 1: There is a small defect with mild reduction in uptake present in the apical anterior location(s) that is partially reversible. There is normal wall motion in the defect area. Consistent with ischemia. Defect 2: There is a  small defect with mild reduction in uptake present in the basal anterolateral location(s) that is partially reversible.   Left ventricular function is normal. End diastolic cavity size is normal. End systolic cavity size is normal.   Intermediate risk with Duke treadmill score of -3.5.  Limited exercise tolerance with equivocal ST segment depression in leads II, III, and aVF and a ventricular couplet at the end of exercise.  Partially reversible apical anterior and basal anterolateral defects are noted in the setting of breast attenuation reducing specificity of this finding.  Cannot exclude mild ischemia in these regions.  Labs/Other Tests and Data Reviewed:    EKG:  An ECG dated 09/28/2020 was personally reviewed today and demonstrated:  Sinus rhythm with poor R wave progression rule out old anterior infarct pattern, low voltage.  Recent Labs: 10/06/2020: ALT 25; TSH 10.31 11/13/2020: BUN 17; Creatinine, Ser 0.87;  Hemoglobin 13.9; Platelets 131; Potassium 3.6; Sodium 139   Recent Lipid Panel Lab Results  Component Value Date/Time   CHOL 174 04/23/2019 12:45 PM   TRIG 74 04/23/2019 12:45 PM   HDL 58 04/23/2019 12:45 PM   CHOLHDL 4.1 10/31/2018 08:35 AM   LDLCALC 102 (H) 04/23/2019 12:45 PM   LDLCALC 111 (H) 10/31/2018 08:35 AM    Wt Readings from Last 3 Encounters:  11/13/20 236 lb (107 kg)  10/29/20 237 lb (107.5 kg)  10/06/20 238 lb (108 kg)     Objective:    Vital Signs:  BP 124/76   Pulse 68   Ht '5\' 4"'$  (1.626 m)   Wt 236 lb (107 kg)   SpO2 99%   BMI 40.51 kg/m    Patient spoke in complete sentences on the phone.  ASSESSMENT & PLAN:    1.  Abnormal exercise Myoview as detailed above with history of exercise intolerance and previously episode of chest pain back in July.  Per discussion today and review of risks and benefits, patient is in agreement to proceed with a diagnostic cardiac catheterization for clarification of coronary anatomy and determine if revascularization options need to be considered.  Starting aspirin 81 mg daily for now, otherwise no change in medical therapy pending further evaluation.  2.  Mild thrombocytopenia, chronic with recent platelet count 131 and no spontaneous bleeding problems.  Not specifically a contraindication to utilize antiplatelet therapy with appropriate follow-up.  3.  History of palpitations, cardiac monitor demonstrated only rare atrial and ventricular ectopy with 2 very brief episodes of SVT.  No evidence of atrial fibrillation or sustained arrhythmias.   Time:   Today, I have spent 8 minutes with the patient with telehealth technology discussing the above problems.     Medication Adjustments/Labs and Tests Ordered: Current medicines are reviewed at length with the patient today.  Concerns regarding medicines are outlined above.   Tests Ordered: No orders of the defined types were placed in this encounter.   Medication Changes: No  orders of the defined types were placed in this encounter.   Follow Up:  In Person after procedure.  Signed, Rozann Lesches, MD  11/13/2020 10:39 AM    [place]

## 2020-11-13 NOTE — Progress Notes (Signed)
Virtual Visit via Telephone Note   This visit type was conducted due to national recommendations for restrictions regarding the COVID-19 Pandemic (e.g. social distancing) in an effort to limit this patient's exposure and mitigate transmission in our community.  Due to her co-morbid illnesses, this patient is at least at moderate risk for complications without adequate follow up.  This format is felt to be most appropriate for this patient at this time.  The patient did not have access to video technology/had technical difficulties with video requiring transitioning to audio format only (telephone).  All issues noted in this document were discussed and addressed.  No physical exam could be performed with this format.  Please refer to the patient's chart for her  consent to telehealth for [facility]    Date:  11/13/2020   ID:  Amanda Hoover, DOB [birth year], MRN [id number] The patient was identified using 2 identifiers.  Patient Location: Home Provider Location: Office/Clinic   PCP:  Susy Frizzle, MD   [facility] Providers Cardiologist:  Rozann Lesches, MD {   Evaluation Performed:  Follow-Up Visit  Chief Complaint:  Discussion of cardiac catheterization.  History of Present Illness:    Amanda Hoover is a 65 y.o. female seen recently in consultation on August 17 for evaluation of exercise intolerance and a history of prolonged chest discomfort.  She had been evaluated in the ER back in July with normal cardiac enzymes.  Ischemic testing was arranged.  Exercise Myoview done on August 31 was abnormal as discussed below.  Overall intermediate risk based on Duke treadmill score with limited exercise time and equivocal ST segment changes in the inferior leads.  There were partially reversible apical anterior and basal anterolateral defects on perfusion imaging in the setting of breast attenuation, unable to exclude mild ischemia although variable soft tissue attenuation also  possible.  We discussed these changes today and went over the risks and benefits of a diagnostic cardiac catheterization to clarify coronary anatomy and determine if any revascularization options need to be considered.  The patient understands that risks include but are not limited to stroke (1 in 1000), death (1 in 32), kidney failure [usually temporary] (1 in 500), bleeding (1 in 200), allergic reaction [possibly serious] (1 in 200).  Lab work obtained today as well and noted below.  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   Breast cancer, right ([place])    ER 0 PR0 LN neg.   Chronic headaches    Chronic ITP (idiopathic thrombocytopenia) (HCC)    mild thrombocytopenia, normal spleen on [place] 11/18, mild cirrhosis   Cirrhosis ([place])    Dental crowns present    Depression    Fatty liver    GERD (gastroesophageal reflux disease)    Heart murmur    Hiatal hernia    History of colon polyps    History of esophageal stricture    Status post esophageal dilatation   History of radiation therapy 03/28/2012-05/17/2012   60.4 gray to right breast   Hyperlipidemia    Hypothyroid    Multiple environmental allergies    Multiple food allergies    Osteopenia    Osteoporosis    Palpitations    Personal history of radiation therapy 2013   Right Breast Cancer   Rash 01/06/2012   right elbow   Rhinitis, allergic  rhinitis   Past Surgical History:  Procedure Laterality Date   BREAST LUMPECTOMY Right 2013   COLONOSCOPY     COLONOSCOPY W/ POLYPECTOMY     ESOPHAGOGASTRODUODENOSCOPY     MASTECTOMY Right 2013   partial mast   PARTIAL MASTECTOMY WITH NEEDLE LOCALIZATION AND AXILLARY SENTINEL LYMPH NODE BX  01/12/2012   Procedure: PARTIAL MASTECTOMY WITH NEEDLE LOCALIZATION AND AXILLARY SENTINEL LYMPH NODE BX;  Surgeon: Adin Hector, MD;  Location: [place] [place];  Service: General;  Laterality: Right;   RE-EXCISION OF BREAST CANCER,SUPERIOR MARGINS  02/15/2012   Procedure: RE-EXCISION OF BREAST CANCER,SUPERIOR MARGINS;  Surgeon: Adin Hector, MD;  Location: [place];  Service: General;  Laterality: Right;  Re-excision of right lumpectomy, close posterior margin.     Current Meds  Medication Sig   Ascorbic Acid (VITAMIN C ER PO) Take by mouth.   Cholecalciferol (D3-1000 PO) Take by mouth.   EPINEPHrine 0.3 mg/0.3 mL IJ SOAJ injection Inject 0.3 mg into the muscle as needed for anaphylaxis.   famotidine (PEPCID) 40 MG tablet Take 1 tablet (40 mg total) by mouth daily.   levothyroxine (SYNTHROID) 75 MCG tablet TAKE 1 TABLET (75 MCG) BY MOUTH DAILY   Methylcobalamin (B12-ACTIVE PO) Take 1 tablet by mouth.   Multiple Vitamin (MULTIVITAMIN) tablet Take 1 tablet by mouth daily.   pantoprazole (PROTONIX) 40 MG tablet TAKE 1 TABLET(40 MG) BY MOUTH DAILY     Allergies:   Macadamia nut oil, Other, Peanut-containing drug products, Tomato, Adhesive [tape], Flour, Penicillins, Hydrocodone, Oxycodone, Sulfa antibiotics, Vicodin [hydrocodone-acetaminophen], Eggs or egg-derived products, Soap, and Tramadol   Social History   Tobacco Use   Smoking status: Former    Packs/day: 0.50    Years: 8.00    Pack years: 4.00    Types: Cigarettes    Quit date: 2006    Years since quitting: 16.6   Smokeless tobacco: Never  Vaping Use   Vaping Use: Never used  Substance Use Topics   Alcohol use: No   Drug use: No    Comment: quit smoking 11 years ago     Family Hx: The patient's family history includes Breast cancer in her paternal aunt and another family member; Breast cancer (age of onset: 93) in her paternal grandmother; Cancer in her father and maternal uncle; Crohn's disease in her mother; Depression in her mother; Diabetes in her maternal grandmother and paternal grandmother; Esophageal cancer (age of onset: 62) in her father;  Hyperlipidemia in her mother; Hypertension in her mother; Irritable bowel syndrome in her mother; Liver disease in an other family member; Melanoma in her cousin; Stroke in her mother; Thyroid disease in her mother. There is no history of Colon cancer, Rectal cancer, or Stomach cancer.  ROS:   Please see the history of present illness.    All other systems reviewed and are negative.   Prior CV studies:   The following studies were reviewed today:  Exercise Myoview 11/12/2020:   Findings are consistent with ischemia. Findings are equivocal. The study is intermediate risk. Duke treadmill score of -3.5.   0.5 mm of up sloping ST depression (II, III and aVF) was noted. Ventricular couplet noted at the end of exercise, otherwise no significant arrhythmias.   Defect 1: There is a small defect with mild reduction in uptake present in the apical anterior location(s) that is partially reversible. There is normal wall motion in the defect area. Consistent with ischemia. Defect 2: There is a  small defect with mild reduction in uptake present in the basal anterolateral location(s) that is partially reversible.   Left ventricular function is normal. End diastolic cavity size is normal. End systolic cavity size is normal.   Intermediate risk with Duke treadmill score of -3.5.  Limited exercise tolerance with equivocal ST segment depression in leads II, III, and aVF and a ventricular couplet at the end of exercise.  Partially reversible apical anterior and basal anterolateral defects are noted in the setting of breast attenuation reducing specificity of this finding.  Cannot exclude mild ischemia in these regions.  Labs/Other Tests and Data Reviewed:    EKG:  An ECG dated 09/28/2020 was personally reviewed today and demonstrated:  Sinus rhythm with poor R wave progression rule out old anterior infarct pattern, low voltage.  Recent Labs: 10/06/2020: ALT 25; TSH 10.31 11/13/2020: BUN 17; Creatinine, Ser 0.87;  Hemoglobin 13.9; Platelets 131; Potassium 3.6; Sodium 139   Recent Lipid Panel Lab Results  Component Value Date/Time   CHOL 174 04/23/2019 12:45 PM   TRIG 74 04/23/2019 12:45 PM   HDL 58 04/23/2019 12:45 PM   CHOLHDL 4.1 10/31/2018 08:35 AM   LDLCALC 102 (H) 04/23/2019 12:45 PM   LDLCALC 111 (H) 10/31/2018 08:35 AM    Wt Readings from Last 3 Encounters:  11/13/20 236 lb (107 kg)  10/29/20 237 lb (107.5 kg)  10/06/20 238 lb (108 kg)     Objective:    Vital Signs:  BP 124/76   Pulse 68   Ht '5\' 4"'$  (1.626 m)   Wt 236 lb (107 kg)   SpO2 99%   BMI 40.51 kg/m    Patient spoke in complete sentences on the phone.  ASSESSMENT & PLAN:    1.  Abnormal exercise Myoview as detailed above with history of exercise intolerance and previously episode of chest pain back in July.  Per discussion today and review of risks and benefits, patient is in agreement to proceed with a diagnostic cardiac catheterization for clarification of coronary anatomy and determine if revascularization options need to be considered.  Starting aspirin 81 mg daily for now, otherwise no change in medical therapy pending further evaluation.  2.  Mild thrombocytopenia, chronic with recent platelet count 131 and no spontaneous bleeding problems.  Not specifically a contraindication to utilize antiplatelet therapy with appropriate follow-up.  3.  History of palpitations, cardiac monitor demonstrated only rare atrial and ventricular ectopy with 2 very brief episodes of SVT.  No evidence of atrial fibrillation or sustained arrhythmias.   Time:   Today, I have spent 8 minutes with the patient with telehealth technology discussing the above problems.     Medication Adjustments/Labs and Tests Ordered: Current medicines are reviewed at length with the patient today.  Concerns regarding medicines are outlined above.   Tests Ordered: No orders of the defined types were placed in this encounter.   Medication Changes: No  orders of the defined types were placed in this encounter.   Follow Up:  In Person after procedure.  Signed, Rozann Lesches, MD  11/13/2020 10:39 AM    [place]

## 2020-11-13 NOTE — Telephone Encounter (Signed)
-----   Message from Satira Sark, MD sent at 11/12/2020  3:58 PM EDT ----- Results reviewed.  Please let her know that the stress test was not normal, overall intermediate risk from a cardiac perspective and with equivocal evidence of ischemia affecting the anterior and anterolateral wall, potentially LAD distribution.  In short, I think we need to pursue this further with prior history of chest pain and also exercise intolerance.  Would recommend a diagnostic cardiac catheterization if she is in agreement.  She was seen recently in the office, I can addend my recent note.

## 2020-11-18 ENCOUNTER — Telehealth: Payer: Self-pay | Admitting: *Deleted

## 2020-11-18 MED ORDER — SODIUM CHLORIDE 0.9 % WEIGHT BASED INFUSION
3.0000 mL/kg/h | INTRAVENOUS | Status: AC
  Administered 2020-11-19: 3 mL/kg/h via INTRAVENOUS

## 2020-11-18 MED ORDER — SODIUM CHLORIDE 0.9 % WEIGHT BASED INFUSION
1.0000 mL/kg/h | INTRAVENOUS | Status: DC
  Administered 2020-11-19: 1 mL/kg/h via INTRAVENOUS

## 2020-11-18 MED ORDER — SODIUM CHLORIDE 0.9% FLUSH: 3.0000 mL | INTRAVENOUS | Status: DC | PRN

## 2020-11-18 MED ORDER — ASPIRIN 81 MG PO CHEW: 81.0000 mg | CHEWABLE_TABLET | ORAL | Status: DC

## 2020-11-18 MED ORDER — SODIUM CHLORIDE 0.9 % IV SOLN: 250.0000 mL | INTRAVENOUS | Status: DC | PRN

## 2020-11-18 NOTE — Telephone Encounter (Signed)
Cardiac catheterization scheduled at [facility] for: Wednesday November 19, 2020 8:30 AM [facility] Main Entrance [facility]) at: 6:30 AM   No solid food after midnight prior to cath, clear liquids until 5 AM day of procedure.   Morning medications can be taken pre-cath with sips of water including aspirin 81 mg.    Confirmed patient has responsible adult to drive home post procedure and be with patient first 24 hours after arriving home.  Patients are allowed one visitor in the waiting room during the time they are at the hospital for their procedure. Both patient and visitor must wear a mask once they enter the hospital.   Patient reports does not currently have any symptoms concerning for COVID-19 and no household members with COVID-19 like illness.      Reviewed procedure/mask/visitor instructions with patient.

## 2020-11-19 ENCOUNTER — Ambulatory Visit (HOSPITAL_COMMUNITY)
Admission: RE | Disposition: A | Discharge status: Home / Self Care | Payer: Self-pay | Source: Home / Self Care | Attending: Cardiovascular Disease

## 2020-11-19 ENCOUNTER — Other Ambulatory Visit: Payer: Self-pay

## 2020-11-19 ENCOUNTER — Encounter (HOSPITAL_COMMUNITY): Payer: Self-pay | Admitting: Cardiovascular Disease

## 2020-11-19 ENCOUNTER — Ambulatory Visit (HOSPITAL_COMMUNITY)
Admission: RE | Admit: 2020-11-19 | Discharge: 2020-11-19 | Disposition: A | Discharge status: Home / Self Care | Payer: Medicare Other | Attending: Cardiovascular Disease | Admitting: Cardiovascular Disease

## 2020-11-19 DIAGNOSIS — I2510 Atherosclerotic heart disease of native coronary artery without angina pectoris: Secondary | ICD-10-CM | POA: Diagnosis not present

## 2020-11-19 DIAGNOSIS — Z91010 Allergy to peanuts: Secondary | ICD-10-CM | POA: Diagnosis not present

## 2020-11-19 DIAGNOSIS — Z79890 Hormone replacement therapy (postmenopausal): Secondary | ICD-10-CM | POA: Diagnosis not present

## 2020-11-19 DIAGNOSIS — Z880 Allergy status to penicillin: Secondary | ICD-10-CM | POA: Insufficient documentation

## 2020-11-19 DIAGNOSIS — Z87891 Personal history of nicotine dependence: Secondary | ICD-10-CM | POA: Diagnosis not present

## 2020-11-19 DIAGNOSIS — Z91012 Allergy to eggs: Secondary | ICD-10-CM | POA: Insufficient documentation

## 2020-11-19 DIAGNOSIS — D696 Thrombocytopenia, unspecified: Secondary | ICD-10-CM | POA: Insufficient documentation

## 2020-11-19 DIAGNOSIS — Z885 Allergy status to narcotic agent status: Secondary | ICD-10-CM | POA: Insufficient documentation

## 2020-11-19 DIAGNOSIS — Z79899 Other long term (current) drug therapy: Secondary | ICD-10-CM | POA: Insufficient documentation

## 2020-11-19 DIAGNOSIS — Z91048 Other nonmedicinal substance allergy status: Secondary | ICD-10-CM | POA: Diagnosis not present

## 2020-11-19 DIAGNOSIS — Z91018 Allergy to other foods: Secondary | ICD-10-CM | POA: Diagnosis not present

## 2020-11-19 DIAGNOSIS — R9439 Abnormal result of other cardiovascular function study: Secondary | ICD-10-CM

## 2020-11-19 HISTORY — PX: LEFT HEART CATH AND CORONARY ANGIOGRAPHY: CATH118249

## 2020-11-19 SURGERY — LEFT HEART CATH AND CORONARY ANGIOGRAPHY
Anesthesia: LOCAL

## 2020-11-19 MED ORDER — FENTANYL CITRATE (PF) 100 MCG/2ML IJ SOLN
INTRAMUSCULAR | Status: AC
  Filled 2020-11-19: qty 2

## 2020-11-19 MED ORDER — LABETALOL HCL 5 MG/ML IV SOLN: 10.0000 mg | INTRAVENOUS | Status: DC | PRN

## 2020-11-19 MED ORDER — LIDOCAINE HCL (PF) 1 % IJ SOLN
INTRAMUSCULAR | Status: AC
  Filled 2020-11-19: qty 30

## 2020-11-19 MED ORDER — IOHEXOL 350 MG/ML SOLN
INTRAVENOUS | Status: DC | PRN
  Administered 2020-11-19: 50 mL via INTRA_ARTERIAL

## 2020-11-19 MED ORDER — ACETAMINOPHEN 325 MG PO TABS: 650.0000 mg | ORAL_TABLET | ORAL | Status: DC | PRN

## 2020-11-19 MED ORDER — HYDRALAZINE HCL 20 MG/ML IJ SOLN: 10.0000 mg | INTRAMUSCULAR | Status: DC | PRN

## 2020-11-19 MED ORDER — SODIUM CHLORIDE 0.9 % IV SOLN: 250.0000 mL | INTRAVENOUS | Status: DC | PRN

## 2020-11-19 MED ORDER — FENTANYL CITRATE (PF) 100 MCG/2ML IJ SOLN
INTRAMUSCULAR | Status: DC | PRN
  Administered 2020-11-19: 50 ug via INTRAVENOUS

## 2020-11-19 MED ORDER — SODIUM CHLORIDE 0.9% FLUSH: 3.0000 mL | Freq: Two times a day (BID) | INTRAVENOUS | Status: DC

## 2020-11-19 MED ORDER — HEPARIN (PORCINE) IN NACL 1000-0.9 UT/500ML-% IV SOLN
INTRAVENOUS | Status: AC
  Filled 2020-11-19: qty 500

## 2020-11-19 MED ORDER — LIDOCAINE HCL (PF) 1 % IJ SOLN
INTRAMUSCULAR | Status: DC | PRN
  Administered 2020-11-19: 15 mL via INTRADERMAL

## 2020-11-19 MED ORDER — HEPARIN (PORCINE) IN NACL 1000-0.9 UT/500ML-% IV SOLN
INTRAVENOUS | Status: DC | PRN
  Administered 2020-11-19 (×2): 500 mL

## 2020-11-19 MED ORDER — HEPARIN SODIUM (PORCINE) 1000 UNIT/ML IJ SOLN
INTRAMUSCULAR | Status: AC
  Filled 2020-11-19: qty 1

## 2020-11-19 MED ORDER — VERAPAMIL HCL 2.5 MG/ML IV SOLN
INTRAVENOUS | Status: AC
  Filled 2020-11-19: qty 2

## 2020-11-19 MED ORDER — MIDAZOLAM HCL 2 MG/2ML IJ SOLN
INTRAMUSCULAR | Status: AC
  Filled 2020-11-19: qty 2

## 2020-11-19 MED ORDER — SODIUM CHLORIDE 0.9% FLUSH: 3.0000 mL | INTRAVENOUS | Status: DC | PRN

## 2020-11-19 MED ORDER — MIDAZOLAM HCL 2 MG/2ML IJ SOLN
INTRAMUSCULAR | Status: DC | PRN
  Administered 2020-11-19: 2 mg via INTRAVENOUS

## 2020-11-19 MED ORDER — ONDANSETRON HCL 4 MG/2ML IJ SOLN: 4.0000 mg | Freq: Four times a day (QID) | INTRAMUSCULAR | Status: DC | PRN

## 2020-11-19 MED ORDER — SODIUM CHLORIDE 0.9 % IV SOLN: INTRAVENOUS | Status: DC

## 2020-11-19 SURGICAL SUPPLY — 9 items
CATH INFINITI 5FR MULTPACK ANG (CATHETERS) ×2 IMPLANT
CLOSURE MYNX CONTROL 5F (Vascular Products) ×2 IMPLANT
KIT HEART LEFT (KITS) ×2 IMPLANT
PACK CARDIAC CATHETERIZATION (CUSTOM PROCEDURE TRAY) ×2 IMPLANT
SHEATH PINNACLE 5F 10CM (SHEATH) ×2 IMPLANT
SYR MEDRAD MARK 7 150ML (SYRINGE) ×2 IMPLANT
TRANSDUCER W/STOPCOCK (MISCELLANEOUS) ×2 IMPLANT
TUBING CIL FLEX 10 FLL-RA (TUBING) ×2 IMPLANT
WIRE EMERALD 3MM-J .035X150CM (WIRE) ×2 IMPLANT

## 2020-11-19 NOTE — Interval H&P Note (Signed)
Cath Lab Visit (complete for each Cath Lab visit)  Clinical Evaluation Leading to the Procedure:   ACS: No.  Non-ACS:    Anginal Classification: CCS II  Anti-ischemic medical therapy: No Therapy  Non-Invasive Test Results: Intermediate-risk stress test findings: cardiac mortality 1-3%/year  Prior CABG: No previous CABG      History and Physical Interval Note:  11/19/2020 8:39 AM  Amanda Hoover  has presented today for surgery, with the diagnosis of abnormal stress test.  The various methods of treatment have been discussed with the patient and family. After consideration of risks, benefits and other options for treatment, the patient has consented to  Procedure(s): LEFT HEART CATH AND CORONARY ANGIOGRAPHY (N/A) as a surgical intervention.  The patient's history has been reviewed, patient examined, no change in status, stable for surgery.  I have reviewed the patient's chart and labs.  Questions were answered to the patient's satisfaction.     Shelva Majestic

## 2020-11-19 NOTE — Progress Notes (Signed)
Pt ambulated without difficulty or bleeding.   Discharged home with her husband who will drive and stay with pt x 24 hrs. 

## 2020-11-20 MED FILL — Verapamil HCl IV Soln 2.5 MG/ML: INTRAVENOUS | Qty: 2 | Status: AC

## 2020-11-21 ENCOUNTER — Telehealth: Payer: Self-pay | Admitting: Cardiology

## 2020-11-21 NOTE — Telephone Encounter (Signed)
Pt notified and voiced understanding of plan of care.

## 2020-11-21 NOTE — Telephone Encounter (Signed)
New message    Patients want to know when she can return to work after heart cath ? She works at [employer] and she is standing and lifting all day long , how long will she have restrictions?

## 2020-11-24 ENCOUNTER — Other Ambulatory Visit: Payer: Self-pay

## 2020-11-24 ENCOUNTER — Ambulatory Visit (INDEPENDENT_AMBULATORY_CARE_PROVIDER_SITE_OTHER): Payer: Medicare Other | Admitting: Family Medicine

## 2020-11-24 ENCOUNTER — Encounter: Payer: Self-pay | Admitting: Family Medicine

## 2020-11-24 VITALS — BP 138/78 | HR 78 | Temp 97.6°F | Resp 16 | Ht 64.0 in | Wt 236.0 lb

## 2020-11-24 DIAGNOSIS — E038 Other specified hypothyroidism: Secondary | ICD-10-CM | POA: Diagnosis not present

## 2020-11-24 DIAGNOSIS — R079 Chest pain, unspecified: Secondary | ICD-10-CM

## 2020-11-24 DIAGNOSIS — Z13220 Encounter for screening for lipoid disorders: Secondary | ICD-10-CM | POA: Diagnosis not present

## 2020-11-24 MED ORDER — ALPRAZOLAM 0.5 MG PO TABS: 0.5000 mg | ORAL_TABLET | Freq: Three times a day (TID) | ORAL | 0 refills | Status: DC | PRN

## 2020-11-24 NOTE — Progress Notes (Signed)
Subjective:    Patient ID: Amanda Hoover, female    DOB: [birth year], 65 y.o.   MRN: [id number]  HPI  TSH in July was > 10.  Currently on levothyroxine 75 mcg poqday.  She is due today to recheck her TSH.  Since I last saw the patient, I referred her to cardiology due to atypical chest pain and palpitations.  Cardiology has seen the patient.  She had an abnormal stress test so they performed a catheterization.  Cardiac catheterization revealed a 10% blockage in the LAD but was otherwise normal with an ejection fraction of 65%.  Cardiology discontinued the Toprol.  The patient states that she still having the chest pain.  It occurs intermittently.  It is a tightness in the center of her chest below her sternum.  There is no exacerbating or alleviating factors.  It can last for hours.  Chest x-ray has been normal. 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   Breast cancer, right ([place])    ER 0 PR0 LN neg.   Chronic headaches    Chronic ITP (idiopathic thrombocytopenia) (HCC)    mild thrombocytopenia, normal spleen on [place] 11/18, mild cirrhosis   Cirrhosis ([place])    Dental crowns present    Depression    Fatty liver    GERD (gastroesophageal reflux disease)    Heart murmur    Hiatal hernia    History of colon polyps    History of esophageal stricture    Status post esophageal dilatation   History of radiation therapy 03/28/2012-05/17/2012   60.4 gray to right breast   Hyperlipidemia    Hypothyroid    Multiple environmental allergies    Multiple food allergies    Osteopenia    Osteoporosis    Palpitations    Personal history of radiation therapy 2013   Right Breast Cancer   Rash 01/06/2012   right elbow   Rhinitis, allergic    rhinitis   Past Surgical History:  Procedure Laterality Date   BREAST LUMPECTOMY Right 2013   COLONOSCOPY     COLONOSCOPY W/  POLYPECTOMY     ESOPHAGOGASTRODUODENOSCOPY     LEFT HEART CATH AND CORONARY ANGIOGRAPHY N/A 11/19/2020   Procedure: LEFT HEART CATH AND CORONARY ANGIOGRAPHY;  Surgeon: Troy Sine, MD;  Location: [facility];  Service: Cardiovascular;  Laterality: N/A;   MASTECTOMY Right 2013   partial mast   PARTIAL MASTECTOMY WITH NEEDLE LOCALIZATION AND AXILLARY SENTINEL LYMPH NODE BX  01/12/2012   Procedure: PARTIAL MASTECTOMY WITH NEEDLE LOCALIZATION AND AXILLARY SENTINEL LYMPH NODE BX;  Surgeon: Adin Hector, MD;  Location: [facility];  Service: General;  Laterality: Right;   RE-EXCISION OF BREAST CANCER,SUPERIOR MARGINS  02/15/2012   Procedure: RE-EXCISION OF BREAST CANCER,SUPERIOR MARGINS;  Surgeon: Adin Hector, MD;  Location: [facility];  Service: General;  Laterality: Right;  Re-excision of right lumpectomy, close posterior margin.   Current Outpatient Medications on File Prior to Visit  Medication Sig Dispense Refill   diphenhydrAMINE (BENADRYL) 25 MG tablet Take 25 mg by mouth at bedtime as needed for allergies or sleep.     EPINEPHrine 0.3 mg/0.3 mL IJ SOAJ injection Inject 0.3 mg into the muscle as needed for anaphylaxis. 2 each 1   famotidine (PEPCID) 40 MG tablet Take 1 tablet (40 mg total) by  mouth daily. 30 tablet 11   ibuprofen (ADVIL) 200 MG tablet Take 400-800 mg by mouth every 6 (six) hours as needed for headache or moderate pain.     levothyroxine (SYNTHROID) 75 MCG tablet TAKE 1 TABLET (75 MCG) BY MOUTH DAILY 30 tablet 3   Methylcobalamin (B12-ACTIVE PO) Take 1 tablet by mouth daily.     Multiple Vitamin (MULTIVITAMIN) tablet Take 1 tablet by mouth daily.     Olopatadine HCl (PATADAY OP) Place 1 drop into both eyes daily as needed (allergies).     pantoprazole (PROTONIX) 40 MG tablet TAKE 1 TABLET(40 MG) BY MOUTH DAILY 90 tablet 3   Polyethyl Glycol-Propyl Glycol (SYSTANE OP) Place 1 drop into both eyes daily as needed (dry eyes).      VITAMIN D PO Take 1 capsule by mouth daily.     Current Facility-Administered Medications on File Prior to Visit  Medication Dose Route Frequency Provider Last Rate Last Admin   sodium chloride flush (NS) 0.9 % injection 3 mL  3 mL Intravenous Q12H Satira Sark, MD       Allergies  Allergen Reactions   Macadamia Nut Oil Anaphylaxis   Other Swelling    POPCORN:  SWELLING THROAT   Peanut-Containing Drug Products Swelling and Other (See Comments)    THROAT TIGHTNESS; ALSO WALNUTS   Pistachio Nut (Diagnostic) Anaphylaxis   Tomato Swelling    SWELLING UVULA   Adhesive [Tape] Other (See Comments)    BLISTERS   Flour Other (See Comments)    SNEEZING   Penicillins Hives   Oxycodone     Unknown reaction   Sulfa Antibiotics Nausea Only   Vicodin [Hydrocodone-Acetaminophen] Hives and Swelling    Pt had flushing, then itching and swelling of lips   Soap Other (See Comments)    FRAGRANCES (SOAP/LOTION/PERFUME):  HEADACHE, RUNNY NOSE   Tramadol Nausea And Vomiting   Social History   Socioeconomic History   Marital status: Married    Spouse name: Not on file   Number of children: 4   Years of education: Not on file   Highest education level: Not on file  Occupational History    Employer: [employer]    Comment: Teacher, English as a foreign language for [employer]  Tobacco Use   Smoking status: Former    Packs/day: 0.50    Years: 8.00    Pack years: 4.00    Types: Cigarettes    Quit date: 2006    Years since quitting: 16.7   Smokeless tobacco: Never  Vaping Use   Vaping Use: Never used  Substance and Sexual Activity   Alcohol use: No   Drug use: No    Comment: quit smoking 11 years ago   Sexual activity: Yes    Birth control/protection: Post-menopausal  Other Topics Concern   Not on file  Social History Narrative   Not on file   Social Determinants of Health   Financial Resource Strain: Not on file  Food Insecurity: Not on file  Transportation Needs: Not on file  Physical  Activity: Not on file  Stress: Not on file  Social Connections: Not on file  Intimate Partner Violence: Not on file      Review of Systems     Objective:   Physical Exam Vitals reviewed.  Constitutional:      General: She is not in acute distress.    Appearance: She is not diaphoretic.  HENT:     Right Ear: Tympanic membrane, ear canal and external ear  normal.     Left Ear: Tympanic membrane, ear canal and external ear normal.     Nose: No mucosal edema, congestion or rhinorrhea.     Right Sinus: No maxillary sinus tenderness or frontal sinus tenderness.     Left Sinus: No maxillary sinus tenderness or frontal sinus tenderness.     Mouth/Throat:     Pharynx: No oropharyngeal exudate.  Eyes:     Conjunctiva/sclera: Conjunctivae normal.  Neck:     Thyroid: No thyroid mass, thyromegaly or thyroid tenderness.  Cardiovascular:     Rate and Rhythm: Normal rate and regular rhythm.     Heart sounds: Normal heart sounds. No murmur heard.   No friction rub. No gallop.  Pulmonary:     Effort: Pulmonary effort is normal. No respiratory distress.     Breath sounds: Normal breath sounds. No stridor. No wheezing or rales.  Musculoskeletal:     Cervical back: Full passive range of motion without pain, normal range of motion and neck supple. No edema or erythema. No pain with movement or muscular tenderness. Normal range of motion.  Lymphadenopathy:     Cervical: No cervical adenopathy.          Assessment & Plan:  Other specified hypothyroidism - Plan: TSH, Lipid panel  Screening cholesterol level - Plan: Lipid panel Chest pain is very atypical.  I am reassured by her normal catheterization.  Differential diagnosis includes anxiety provoked chest pain/muscle spasms in the chest wall versus GERD versus esophageal spasms.  We will try empirically Xanax 0.5 mg p.o. every 8 hours as needed pain.  If the Xanax relieves the pain, I would think that would help to support the theory that  this is anxiety induced muscle spasms in the chest wall.  If the Xanax is not improving, she is already on Pepcid and Protonix, I would consider a diagnostic work-up for esophageal spasms.  Recheck TSH and also screen cholesterol

## 2020-11-25 LAB — LIPID PANEL
Cholesterol: 207 mg/dL — ABNORMAL HIGH (ref <200)
HDL: 46 mg/dL — ABNORMAL LOW (ref >=50)
LDL Cholesterol (Calc): 129 mg/dL (calc) — ABNORMAL HIGH
Non-HDL Cholesterol (Calc): 161 mg/dL (calc) — ABNORMAL HIGH (ref <130)
Total CHOL/HDL Ratio: 4.5 (calc) (ref <5.0)
Triglycerides: 180 mg/dL — ABNORMAL HIGH (ref <150)

## 2020-11-25 LAB — TSH: TSH: 9.05 mIU/L — ABNORMAL HIGH (ref 0.40–4.50)

## 2020-11-26 ENCOUNTER — Other Ambulatory Visit: Payer: Self-pay | Admitting: *Deleted

## 2020-11-26 DIAGNOSIS — E038 Other specified hypothyroidism: Secondary | ICD-10-CM

## 2020-11-26 MED ORDER — LEVOTHYROXINE SODIUM 100 MCG PO TABS: 100.0000 ug | ORAL_TABLET | Freq: Every day | ORAL | 2 refills | Status: DC

## 2020-12-18 ENCOUNTER — Other Ambulatory Visit: Payer: Self-pay

## 2020-12-18 ENCOUNTER — Ambulatory Visit: Payer: Medicare Other | Admitting: Student

## 2020-12-18 ENCOUNTER — Telehealth: Payer: Self-pay

## 2020-12-18 ENCOUNTER — Encounter: Payer: Self-pay | Admitting: Student

## 2020-12-18 ENCOUNTER — Ambulatory Visit (INDEPENDENT_AMBULATORY_CARE_PROVIDER_SITE_OTHER): Payer: Medicare Other

## 2020-12-18 VITALS — BP 112/72 | HR 72 | Ht 64.0 in | Wt 235.0 lb

## 2020-12-18 VITALS — BP 132/74 | HR 72 | Temp 98.6°F | Ht 64.0 in | Wt 236.0 lb

## 2020-12-18 DIAGNOSIS — Z0000 Encounter for general adult medical examination without abnormal findings: Secondary | ICD-10-CM | POA: Diagnosis not present

## 2020-12-18 DIAGNOSIS — R9439 Abnormal result of other cardiovascular function study: Secondary | ICD-10-CM

## 2020-12-18 DIAGNOSIS — E039 Hypothyroidism, unspecified: Secondary | ICD-10-CM | POA: Diagnosis not present

## 2020-12-18 DIAGNOSIS — E785 Hyperlipidemia, unspecified: Secondary | ICD-10-CM

## 2020-12-18 DIAGNOSIS — Z87898 Personal history of other specified conditions: Secondary | ICD-10-CM

## 2020-12-18 NOTE — Progress Notes (Signed)
Cardiology Office Note    Date:  12/18/2020   ID:  Amanda Hoover, DOB 1955-06-21, MRN [id number]  PCP:  Susy Frizzle, MD  Cardiologist: Rozann Lesches, MD    Chief Complaint  Patient presents with   Follow-up    S/p cardiac catheterization    History of Present Illness:    Amanda Hoover is a 65 y.o. female with past medical history of HLD, hypothyroidism, GERD, ITP and history of breast cancer who presents to the office today for follow-up from her recent cardiac catheterization.  She was examined by Dr. Domenic Polite in 10/2020 and reported intermittent episodes of chest discomfort and a Treadmill Myoview was recommended for further evaluation. This was abnormal with an intermediate risk Duke treadmill score of -3.5 and imaging showed partially reversible defects and ischemic could not be excluded but was also noted to have breast attenuation. Dr. Domenic Polite did recommend a cardiac catheterization for definitive evaluation and this was performed by Dr. Claiborne Billings on 11/19/2020 and showed only minimal 10% plaque along the mid LAD but otherwise no plaque and EF was preserved at 65% without focal wall motion normalities. Continued risk factor modification was recommended.  In talking with the patient today, she reports overall feeling well since her catheterization. No complications regarding her cath site. She denies any recurrent chest pain. Does have baseline dyspnea on exertion but no acute changes in this. No reported orthopnea, PND or pitting edema. Recent labs showed her cholesterol was elevated but she thinks this was secondary to her food choices at that time as she had been consuming more pimento cheese sandwiches and also having hot dogs.    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   Breast cancer, right ([place])    ER 0 PR0 LN neg.   Chest  pain    a. false positive NST in 10/2020 with cath in 11/2020 showing only minimal CAD with 10% mid-LAD stenosis.   Chronic headaches    Chronic ITP (idiopathic thrombocytopenia) (HCC)    mild thrombocytopenia, normal spleen on [place] 11/18, mild cirrhosis   Cirrhosis ([place])    Dental crowns present    Depression    Fatty liver    GERD (gastroesophageal reflux disease)    Heart murmur    Hiatal hernia    History of colon polyps    History of esophageal stricture    Status post esophageal dilatation   History of radiation therapy 03/28/2012-05/17/2012   60.4 gray to right breast   Hyperlipidemia    Hypothyroid    Multiple environmental allergies    Multiple food allergies    Osteopenia    Osteoporosis    Palpitations    Personal history of radiation therapy 2013   Right Breast Cancer   Rash 01/06/2012   right elbow   Rhinitis, allergic    rhinitis    Past Surgical History:  Procedure Laterality Date   BREAST LUMPECTOMY Right 2013   COLONOSCOPY     COLONOSCOPY W/ POLYPECTOMY     ESOPHAGOGASTRODUODENOSCOPY     LEFT HEART CATH AND CORONARY ANGIOGRAPHY N/A 11/19/2020   Procedure: LEFT HEART CATH AND CORONARY ANGIOGRAPHY;  Surgeon: Troy Sine, MD;  Location: [facility];  Service: Cardiovascular;  Laterality: N/A;   MASTECTOMY Right 2013   partial mast   PARTIAL MASTECTOMY WITH  NEEDLE LOCALIZATION AND AXILLARY SENTINEL LYMPH NODE BX  01/12/2012   Procedure: PARTIAL MASTECTOMY WITH NEEDLE LOCALIZATION AND AXILLARY SENTINEL LYMPH NODE BX;  Surgeon: Adin Hector, MD;  Location: [facility];  Service: General;  Laterality: Right;   RE-EXCISION OF BREAST CANCER,SUPERIOR MARGINS  02/15/2012   Procedure: RE-EXCISION OF BREAST CANCER,SUPERIOR MARGINS;  Surgeon: Adin Hector, MD;  Location: [facility];  Service: General;  Laterality: Right;  Re-excision of right lumpectomy, close posterior margin.    Current Medications: Outpatient  Medications Prior to Visit  Medication Sig Dispense Refill   ALPRAZolam (XANAX) 0.5 MG tablet Take 1 tablet (0.5 mg total) by mouth 3 (three) times daily as needed. 30 tablet 0   clindamycin (CLEOCIN) 150 MG capsule      diphenhydrAMINE (BENADRYL) 25 MG tablet Take 25 mg by mouth at bedtime as needed for allergies or sleep.     EPINEPHrine 0.3 mg/0.3 mL IJ SOAJ injection Inject 0.3 mg into the muscle as needed for anaphylaxis. 2 each 1   famotidine (PEPCID) 40 MG tablet Take 1 tablet (40 mg total) by mouth daily. 30 tablet 11   ibuprofen (ADVIL) 200 MG tablet Take 400-800 mg by mouth every 6 (six) hours as needed for headache or moderate pain.     levothyroxine (SYNTHROID) 100 MCG tablet Take 1 tablet (100 mcg total) by mouth daily before breakfast. TAKE 1 TABLET (75 MCG) BY MOUTH DAILY 30 tablet 2   Methylcobalamin (B12-ACTIVE PO) Take 1 tablet by mouth daily.     Multiple Vitamin (MULTIVITAMIN) tablet Take 1 tablet by mouth daily.     Olopatadine HCl (PATADAY OP) Place 1 drop into both eyes daily as needed (allergies).     pantoprazole (PROTONIX) 40 MG tablet TAKE 1 TABLET(40 MG) BY MOUTH DAILY 90 tablet 3   Polyethyl Glycol-Propyl Glycol (SYSTANE OP) Place 1 drop into both eyes daily as needed (dry eyes).     VITAMIN D PO Take 1 capsule by mouth daily.     Facility-Administered Medications Prior to Visit  Medication Dose Route Frequency Provider Last Rate Last Admin   sodium chloride flush (NS) 0.9 % injection 3 mL  3 mL Intravenous Q12H Satira Sark, MD         Allergies:   Macadamia nut oil, Other, Peanut-containing drug products, Pistachio nut (diagnostic), Tomato, Adhesive [tape], Flour, Penicillins, Oxycodone, Sulfa antibiotics, Vicodin [hydrocodone-acetaminophen], Soap, and Tramadol   Social History   Socioeconomic History   Marital status: Married    Spouse name: Darrell   Number of children: 4   Years of education: Not on file   Highest education level: Not on file   Occupational History    Employer: [employer]    Comment: Teacher, English as a foreign language for [employer]  Tobacco Use   Smoking status: Former    Packs/day: 0.50    Years: 8.00    Pack years: 4.00    Types: Cigarettes    Quit date: 03/15/2004    Years since quitting: 16.7   Smokeless tobacco: Never  Vaping Use   Vaping Use: Never used  Substance and Sexual Activity   Alcohol use: No   Drug use: No    Comment: quit smoking 11 years ago   Sexual activity: Yes    Birth control/protection: Post-menopausal  Other Topics Concern   Not on file  Social History Narrative   1 daughter and 3 sons   5 grandchildren   Social Determinants of Health  Financial Resource Strain: Low Risk    Difficulty of Paying Living Expenses: Not hard at all  Food Insecurity: No Food Insecurity   Worried About Charity fundraiser in the Last Year: Never true   Ran Out of Food in the Last Year: Never true  Transportation Needs: No Transportation Needs   Lack of Transportation (Medical): No   Lack of Transportation (Non-Medical): No  Physical Activity: Sufficiently Active   Days of Exercise per Week: 5 days   Minutes of Exercise per Session: 30 min  Stress: No Stress Concern Present   Feeling of Stress : Only a little  Social Connections: Engineer, building services of Communication with Friends and Family: More than three times a week   Frequency of Social Gatherings with Friends and Family: More than three times a week   Attends Religious Services: 1 to 4 times per year   Active Member of [employer] or Organizations: Yes   Attends Music therapist: 1 to 4 times per year   Marital Status: Married     Family History:  The patient's family history includes Breast cancer in her paternal aunt and another family member; Breast cancer (age of onset: 26) in her paternal grandmother; Cancer in her father, maternal uncle, and paternal grandmother; Crohn's disease in her mother; Depression in her mother;  Diabetes in her maternal grandmother and paternal grandmother; Esophageal cancer (age of onset: 88) in her father; Hyperlipidemia in her mother; Hypertension in her mother; Irritable bowel syndrome in her mother; Liver disease in an other family member; Melanoma in her cousin; Stroke in her mother; Thyroid disease in her mother.   Review of Systems:    Please see the history of present illness.     All other systems reviewed and are otherwise negative except as noted above.   Physical Exam:    VS:  BP 112/72   Pulse 72   Ht 5\' 4"  (1.626 m)   Wt 235 lb (106.6 kg)   SpO2 99%   BMI 40.34 kg/m    General: Well developed, well nourished,female appearing in no acute distress. Head: Normocephalic, atraumatic. Neck: No carotid bruits. JVD not elevated.  Lungs: Respirations regular and unlabored, without wheezes or rales.  Heart: Regular rate and rhythm. No S3 or S4.  No murmur, no rubs, or gallops appreciated. Abdomen: Appears non-distended. No obvious abdominal masses. Msk:  Strength and tone appear normal for age. No obvious joint deformities or effusions. Extremities: No clubbing or cyanosis. No pitting  edema.  Distal pedal pulses are 2+ bilaterally. Neuro: Alert and oriented X 3. Moves all extremities spontaneously. No focal deficits noted. Psych:  Responds to questions appropriately with a normal affect. Skin: No rashes or lesions noted  Wt Readings from Last 3 Encounters:  12/18/20 235 lb (106.6 kg)  12/18/20 236 lb (107 kg)  11/24/20 236 lb (107 kg)     Studies/Labs Reviewed:   EKG:  EKG is not ordered today.    Recent Labs: 10/06/2020: ALT 25 11/13/2020: BUN 17; Creatinine, Ser 0.87; Hemoglobin 13.9; Platelets 131; Potassium 3.6; Sodium 139 11/24/2020: TSH 9.05   Lipid Panel    Component Value Date/Time   CHOL 207 (H) 11/24/2020 0820   CHOL 174 04/23/2019 1245   TRIG 180 (H) 11/24/2020 0820   HDL 46 (L) 11/24/2020 0820   HDL 58 04/23/2019 1245   CHOLHDL 4.5  11/24/2020 0820   VLDL 25 10/10/2014 1111   LDLCALC 129 (H) 11/24/2020 0820  Additional studies/ records that were reviewed today include:   NST: 11/12/2020 Findings are consistent with ischemia. Findings are equivocal. The study is intermediate risk. Duke treadmill score of -3.5.   0.5 mm of up sloping ST depression (II, III and aVF) was noted. Ventricular couplet noted at the end of exercise, otherwise no significant arrhythmias.   Defect 1: There is a small defect with mild reduction in uptake present in the apical anterior location(s) that is partially reversible. There is normal wall motion in the defect area. Consistent with ischemia. Defect 2: There is a small defect with mild reduction in uptake present in the basal anterolateral location(s) that is partially reversible.   Left ventricular function is normal. End diastolic cavity size is normal. End systolic cavity size is normal.   Intermediate risk with Duke treadmill score of -3.5.  Limited exercise tolerance with equivocal ST segment depression in leads II, III, and aVF and a ventricular couplet at the end of exercise.  Partially reversible apical anterior and basal anterolateral defects are noted in the setting of breast attenuation reducing specificity of this finding.  Cannot exclude mild ischemia in these regions.  LHC: 11/2020  Mid LAD lesion is 10% stenosed.   The left ventricular systolic function is normal.   The left ventricular ejection fraction is 55-65% by visual estimate.   Essentially normal coronary arteries with a dominant right coronary system.  Minimal 10% smooth narrowing in the LAD after the second diagonal vessel.   Normal hyperdynamic LV function with EF estimated approximately 65% without focal segmental wall motion abnormalities.  LVEDP 22 mm.   RECOMMENDATION: I suspect the abnormality seen on the nuclear stress imaging is related to breast/diaphragmatic attenuation contributed by the patient's large  size with BMI consistent with morbid obesity.  Averaged increased exercise, weight reduction.  She will follow-up with Dr. Johnny Bridge.  Assessment:    1. Abnormal myocardial perfusion study   2. History of chest pain   3. Hyperlipidemia LDL goal <70   4. Hypothyroidism, unspecified type      Plan:   In order of problems listed above:  1. Chest Pain/Abnormal Stress Test - She did have a false positive stress test as this was concerning for ischemia but also had breast tissue attenuation. Catheterization showed only minimal 10% plaque as outlined above.  Reviewed the catheterization report in detail with the patient today. Will continue to focus on risk factor modification and she may ultimately benefit from statin therapy as outlined below but will try dietary modifications initially.  2. HLD - Recent FLP showed total cholesterol 207, HDL 46, triglycerides 180 and LDL 129.  Her values were previously more well-controlled but she does report dietary indiscretion as outlined above. Will focus on dietary modification for now and recheck lipids in 6 months. If LDL remains above goal, would recommend initiation of statin therapy.  3. Hypothyroidism - Followed by her PCP. TSH was elevated to 9.05 last month and Synthroid dosing was adjusted at that time.    Medication Adjustments/Labs and Tests Ordered: Current medicines are reviewed at length with the patient today.  Concerns regarding medicines are outlined above.  Medication changes, Labs and Tests ordered today are listed in the Patient Instructions below. Patient Instructions  Medication Instructions:  Your physician recommends that you continue on your current medications as directed. Please refer to the Current Medication list given to you today.  *If you need a refill on your cardiac medications before your next appointment, please  call your pharmacy*   Lab Work: FASTING LIPID PANEL- 6 Months If you have labs (blood work) drawn  today and your tests are completely normal, you will receive your results only by: [facility] (if you have MyChart) OR A paper copy in the mail If you have any lab test that is abnormal or we need to change your treatment, we will call you to review the results.   Testing/Procedures: None   Follow-Up: At [facility], you and your health needs are our priority.  As part of our continuing mission to provide you with exceptional heart care, we have created designated Provider Care Teams.  These Care Teams include your primary Cardiologist (physician) and Advanced Practice Providers (APPs -  Physician Assistants and Nurse Practitioners) who all work together to provide you with the care you need, when you need it.  We recommend signing up for the patient portal called "MyChart".  Sign up information is provided on this After Visit Summary.  MyChart is used to connect with patients for Virtual Visits (Telemedicine).  Patients are able to view lab/test results, encounter notes, upcoming appointments, etc.  Non-urgent messages can be sent to your provider as well.   To learn more about what you can do with MyChart, go to [email or web address].    Your next appointment:   Follow Up: AS NEEDED    Other Instructions     Signed, Erma Heritage, PA-C  12/18/2020 5:31 PM    [facility] [address] [place], [place] [address] Phone: [phone] Fax: [phone]

## 2020-12-18 NOTE — Telephone Encounter (Signed)
Pt here for AWV. Pt asks what she should do regarding her osteoporosis? Pt states she has an Rx for Fosamax but is hesitant to take. Advised pt weight bearing exercises do help but the Fosamax is a good medication for her dx. Pt states she is currently taking Vitamin D but not taking calcium because she was told to stop it when dx with breast cancer. Advised pt that she may need to have Vitamin D level drawn to see where she stands.  Pt asks if she should start taking calcium again?

## 2020-12-18 NOTE — Patient Instructions (Signed)
Amanda Hoover , Thank you for taking time to come for your Medicare Wellness Visit. I appreciate your ongoing commitment to your health goals. Please review the following plan we discussed and let me know if I can assist you in the future.   Screening recommendations/referrals: Colonoscopy: Done 03/03/17 Repeat in 5 years  Mammogram: Done 02/12/2020 Repeat annually  Bone Density: Done 02/12/2020 Repeat every 2 years  Recommended yearly ophthalmology/optometry visit for glaucoma screening and checkup Recommended yearly dental visit for hygiene and checkup  Vaccinations: Influenza vaccine: Due Pneumococcal vaccine: No due until age 74 Tdap vaccine: 10/09/19 Repeat in 10 years  Shingles vaccine: Shingrix discussed. Please contact your pharmacy for coverage information.     Covid-19:Done 04/04/19 and 04/25/19.  Advanced directives: Advance directive discussed with you today. Even though you declined this today, please call our office should you change your mind, and we can give you the proper paperwork for you to fill out.   Conditions/risks identified: Aim for 30 minutes of exercise or brisk walking each day, drink 6-8 glasses of water and eat lots of fruits and vegetables.   Next appointment: Follow up in one year for your annual wellness visit 2023   Preventive Care 65 Years and Older, Female Preventive care refers to lifestyle choices and visits with your health care provider that can promote health and wellness. What does preventive care include? A yearly physical exam. This is also called an annual well check. Dental exams once or twice a year. Routine eye exams. Ask your health care provider how often you should have your eyes checked. Personal lifestyle choices, including: Daily care of your teeth and gums. Regular physical activity. Eating a healthy diet. Avoiding tobacco and drug use. Limiting alcohol use. Practicing safe sex. Taking low-dose aspirin every day. Taking  vitamin and mineral supplements as recommended by your health care provider. What happens during an annual well check? The services and screenings done by your health care provider during your annual well check will depend on your age, overall health, lifestyle risk factors, and family history of disease. Counseling  Your health care provider may ask you questions about your: Alcohol use. Tobacco use. Drug use. Emotional well-being. Home and relationship well-being. Sexual activity. Eating habits. History of falls. Memory and ability to understand (cognition). Work and work Statistician. Reproductive health. Screening  You may have the following tests or measurements: Height, weight, and BMI. Blood pressure. Lipid and cholesterol levels. These may be checked every 5 years, or more frequently if you are over 42 years old. Skin check. Lung cancer screening. You may have this screening every year starting at age 70 if you have a 30-pack-year history of smoking and currently smoke or have quit within the past 15 years. Fecal occult blood test (FOBT) of the stool. You may have this test every year starting at age 6. Flexible sigmoidoscopy or colonoscopy. You may have a sigmoidoscopy every 5 years or a colonoscopy every 10 years starting at age 69. Hepatitis C blood test. Hepatitis B blood test. Sexually transmitted disease (STD) testing. Diabetes screening. This is done by checking your blood sugar (glucose) after you have not eaten for a while (fasting). You may have this done every 1-3 years. Bone density scan. This is done to screen for osteoporosis. You may have this done starting at age 44. Mammogram. This may be done every 1-2 years. Talk to your health care provider about how often you should have regular mammograms. Talk with your health care  provider about your test results, treatment options, and if necessary, the need for more tests. Vaccines  Your health care provider may  recommend certain vaccines, such as: Influenza vaccine. This is recommended every year. Tetanus, diphtheria, and acellular pertussis (Tdap, Td) vaccine. You may need a Td booster every 10 years. Zoster vaccine. You may need this after age 72. Pneumococcal 13-valent conjugate (PCV13) vaccine. One dose is recommended after age 89. Pneumococcal polysaccharide (PPSV23) vaccine. One dose is recommended after age 39. Talk to your health care provider about which screenings and vaccines you need and how often you need them. This information is not intended to replace advice given to you by your health care provider. Make sure you discuss any questions you have with your health care provider. Document Released: 03/28/2015 Document Revised: 11/19/2015 Document Reviewed: 12/31/2014 Elsevier Interactive Patient Education  2017 [place] Prevention in the Home Falls can cause injuries. They can happen to people of all ages. There are many things you can do to make your home safe and to help prevent falls. What can I do on the outside of my home? Regularly fix the edges of walkways and driveways and fix any cracks. Remove anything that might make you trip as you walk through a door, such as a raised step or threshold. Trim any bushes or trees on the path to your home. Use bright outdoor lighting. Clear any walking paths of anything that might make someone trip, such as rocks or tools. Regularly check to see if handrails are loose or broken. Make sure that both sides of any steps have handrails. Any raised decks and porches should have guardrails on the edges. Have any leaves, snow, or ice cleared regularly. Use sand or salt on walking paths during winter. Clean up any spills in your garage right away. This includes oil or grease spills. What can I do in the bathroom? Use night lights. Install grab bars by the toilet and in the tub and shower. Do not use towel bars as grab bars. Use non-skid  mats or decals in the tub or shower. If you need to sit down in the shower, use a plastic, non-slip stool. Keep the floor dry. Clean up any water that spills on the floor as soon as it happens. Remove soap buildup in the tub or shower regularly. Attach bath mats securely with double-sided non-slip rug tape. Do not have throw rugs and other things on the floor that can make you trip. What can I do in the bedroom? Use night lights. Make sure that you have a light by your bed that is easy to reach. Do not use any sheets or blankets that are too big for your bed. They should not hang down onto the floor. Have a firm chair that has side arms. You can use this for support while you get dressed. Do not have throw rugs and other things on the floor that can make you trip. What can I do in the kitchen? Clean up any spills right away. Avoid walking on wet floors. Keep items that you use a lot in easy-to-reach places. If you need to reach something above you, use a strong step stool that has a grab bar. Keep electrical cords out of the way. Do not use floor polish or wax that makes floors slippery. If you must use wax, use non-skid floor wax. Do not have throw rugs and other things on the floor that can make you trip. What can I  do with my stairs? Do not leave any items on the stairs. Make sure that there are handrails on both sides of the stairs and use them. Fix handrails that are broken or loose. Make sure that handrails are as long as the stairways. Check any carpeting to make sure that it is firmly attached to the stairs. Fix any carpet that is loose or worn. Avoid having throw rugs at the top or bottom of the stairs. If you do have throw rugs, attach them to the floor with carpet tape. Make sure that you have a light switch at the top of the stairs and the bottom of the stairs. If you do not have them, ask someone to add them for you. What else can I do to help prevent falls? Wear shoes  that: Do not have high heels. Have rubber bottoms. Are comfortable and fit you well. Are closed at the toe. Do not wear sandals. If you use a stepladder: Make sure that it is fully opened. Do not climb a closed stepladder. Make sure that both sides of the stepladder are locked into place. Ask someone to hold it for you, if possible. Clearly mark and make sure that you can see: Any grab bars or handrails. First and last steps. Where the edge of each step is. Use tools that help you move around (mobility aids) if they are needed. These include: Canes. Walkers. Scooters. Crutches. Turn on the lights when you go into a dark area. Replace any light bulbs as soon as they burn out. Set up your furniture so you have a clear path. Avoid moving your furniture around. If any of your floors are uneven, fix them. If there are any pets around you, be aware of where they are. Review your medicines with your doctor. Some medicines can make you feel dizzy. This can increase your chance of falling. Ask your doctor what other things that you can do to help prevent falls. This information is not intended to replace advice given to you by your health care provider. Make sure you discuss any questions you have with your health care provider. Document Released: 12/26/2008 Document Revised: 08/07/2015 Document Reviewed: 04/05/2014 Elsevier Interactive Patient Education  2017 [employer].

## 2020-12-18 NOTE — Progress Notes (Signed)
Subjective:   Amanda Hoover is a 65 y.o. female who presents for an Initial Medicare Annual Wellness Visit.  Review of Systems     Cardiac Risk Factors include: advanced age (>6men, >76 women);obesity (BMI >30kg/m2);sedentary lifestyle     Objective:    Today's Vitals   12/18/20 0857  BP: 132/74  Pulse: 72  Temp: 98.6 F (37 C)  SpO2: 99%  Weight: 236 lb (107 kg)  Height: 5\' 4"  (1.626 m)   Body mass index is 40.51 kg/m.  Advanced Directives 12/18/2020 11/19/2020 09/28/2020 03/02/2019 04/08/2018 02/21/2018 03/03/2017  Does Patient Have a Medical Advance Directive? No No No No No Yes Yes  Type of Advance Directive - - - - - - Press photographer  Does patient want to make changes to medical advance directive? - - - - - No - Patient declined -  Would patient like information on creating a medical advance directive? No - Patient declined No - Patient declined No - Patient declined - No - Patient declined - -  Pre-existing out of facility DNR order (yellow form or pink MOST form) - - - - - - -    Current Medications (verified) Outpatient Encounter Medications as of 12/18/2020  Medication Sig   ALPRAZolam (XANAX) 0.5 MG tablet Take 1 tablet (0.5 mg total) by mouth 3 (three) times daily as needed.   clindamycin (CLEOCIN) 150 MG capsule    diphenhydrAMINE (BENADRYL) 25 MG tablet Take 25 mg by mouth at bedtime as needed for allergies or sleep.   EPINEPHrine 0.3 mg/0.3 mL IJ SOAJ injection Inject 0.3 mg into the muscle as needed for anaphylaxis.   famotidine (PEPCID) 40 MG tablet Take 1 tablet (40 mg total) by mouth daily.   ibuprofen (ADVIL) 200 MG tablet Take 400-800 mg by mouth every 6 (six) hours as needed for headache or moderate pain.   levothyroxine (SYNTHROID) 100 MCG tablet Take 1 tablet (100 mcg total) by mouth daily before breakfast. TAKE 1 TABLET (75 MCG) BY MOUTH DAILY   Methylcobalamin (B12-ACTIVE PO) Take 1 tablet by mouth daily.   Multiple Vitamin  (MULTIVITAMIN) tablet Take 1 tablet by mouth daily.   Olopatadine HCl (PATADAY OP) Place 1 drop into both eyes daily as needed (allergies).   pantoprazole (PROTONIX) 40 MG tablet TAKE 1 TABLET(40 MG) BY MOUTH DAILY   Polyethyl Glycol-Propyl Glycol (SYSTANE OP) Place 1 drop into both eyes daily as needed (dry eyes).   VITAMIN D PO Take 1 capsule by mouth daily.   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   Breast cancer, right ([place])    ER 0 PR0 LN neg.   Chronic headaches    Chronic ITP (idiopathic thrombocytopenia) ([facility])    mild thrombocytopenia, normal spleen on [place] 11/18, mild cirrhosis   Cirrhosis ([place])    Dental crowns present    Depression    Fatty liver    GERD (gastroesophageal reflux disease)    Heart murmur    Hiatal hernia    History of colon polyps  History of esophageal stricture    Status post esophageal dilatation   History of radiation therapy 03/28/2012-05/17/2012   60.4 gray to right breast   Hyperlipidemia    Hypothyroid    Multiple environmental allergies    Multiple food allergies    Osteopenia    Osteoporosis    Palpitations    Personal history of radiation therapy 2013   Right Breast Cancer   Rash 01/06/2012   right elbow   Rhinitis, allergic    rhinitis   Past Surgical History:  Procedure Laterality Date   BREAST LUMPECTOMY Right 2013   COLONOSCOPY     COLONOSCOPY W/ POLYPECTOMY     ESOPHAGOGASTRODUODENOSCOPY     LEFT HEART CATH AND  CORONARY ANGIOGRAPHY N/A 11/19/2020   Procedure: LEFT HEART CATH AND CORONARY ANGIOGRAPHY;  Surgeon: Troy Sine, MD;  Location: [facility];  Service: Cardiovascular;  Laterality: N/A;   MASTECTOMY Right 2013   partial mast   PARTIAL MASTECTOMY WITH NEEDLE LOCALIZATION AND AXILLARY SENTINEL LYMPH NODE BX  01/12/2012   Procedure: PARTIAL MASTECTOMY WITH NEEDLE LOCALIZATION AND AXILLARY SENTINEL LYMPH NODE BX;  Surgeon: Adin Hector, MD;  Location: [facility];  Service: General;  Laterality: Right;   RE-EXCISION OF BREAST CANCER,SUPERIOR MARGINS  02/15/2012   Procedure: RE-EXCISION OF BREAST CANCER,SUPERIOR MARGINS;  Surgeon: Adin Hector, MD;  [facility];  Service: General;  Laterality: Right;  Re-excision of right lumpectomy, close posterior margin.   Family History  Problem Relation Age of Onset   Crohn's disease Mother    Irritable bowel syndrome Mother    Hypertension Mother    Hyperlipidemia Mother    Stroke Mother    Thyroid disease Mother    Depression Mother    Esophageal cancer Father 25   Cancer Father    Breast cancer Paternal Aunt        dx in her early 39s   Diabetes Maternal Grandmother    Breast cancer Paternal Grandmother 23   Diabetes Paternal Grandmother    Cancer Paternal Grandmother    Cancer Maternal Uncle        unknown form of cancer   Melanoma Cousin        maternal cousin dx in her 71s   Breast cancer Other    Liver disease Other    Colon cancer Neg Hx    Rectal cancer Neg Hx    Stomach cancer Neg Hx    Social History   Socioeconomic History   Marital status: Married    Spouse name: Darrell   Number of children: 4   Years of education: Not on file   Highest education level: Not on file  Occupational History    Employer: [employer]    Comment: Teacher, English as a foreign language for [employer]  Tobacco Use   Smoking status: Former    Packs/day: 0.50    Years: 8.00    Pack years: 4.00    Types:  Cigarettes    Quit date: 03/15/2004    Years since quitting: 16.7   Smokeless tobacco: Never  Vaping Use   Vaping Use: Never used  Substance and Sexual Activity   Alcohol use: No   Drug use: No    Comment: quit smoking 11 years ago   Sexual activity: Yes    Birth control/protection: Post-menopausal  Other Topics Concern   Not on file  Social History Narrative   1 daughter and 3 sons   5 grandchildren  Social Determinants of Health   Financial Resource Strain: Low Risk    Difficulty of Paying Living Expenses: Not hard at all  Food Insecurity: No Food Insecurity   Worried About Charity fundraiser in the Last Year: Never true   [place] in the Last Year: Never true  Transportation Needs: No Transportation Needs   Lack of Transportation (Medical): No   Lack of Transportation (Non-Medical): No  Physical Activity: Sufficiently Active   Days of Exercise per Week: 5 days   Minutes of Exercise per Session: 30 min  Stress: No Stress Concern Present   Feeling of Stress : Only a little  Social Connections: Engineer, building services of Communication with Friends and Family: More than three times a week   Frequency of Social Gatherings with Friends and Family: More than three times a week   Attends Religious Services: 1 to 4 times per year   Active Member of [employer] or Organizations: Yes   Attends Archivist Meetings: 1 to 4 times per year   Marital Status: Married    Tobacco Counseling Counseling given: Not Answered   Clinical Intake:  Pre-visit preparation completed: Yes  Pain : No/denies pain     BMI - recorded: 40.51 Nutritional Status: BMI > 30  Obese Nutritional Risks: None Diabetes: No  How often do you need to have someone help you when you read instructions, pamphlets, or other written materials from your doctor or pharmacy?: 1 - Never  Diabetic?no  Interpreter Needed?: No  Information entered by :: MJ Rivky Clendenning, LPN   Activities of  Daily Living In your present state of health, do you have any difficulty performing the following activities: 12/18/2020 11/19/2020  Hearing? N -  Vision? N -  Difficulty concentrating or making decisions? Y -  Walking or climbing stairs? N N  Dressing or bathing? N -  Doing errands, shopping? N -  Preparing Food and eating ? N -  Using the Toilet? N -  In the past six months, have you accidently leaked urine? Y -  Comment Pt c/o some urine leakage, wears pads. -  Do you have problems with loss of bowel control? N -  Managing your Medications? N -  Managing your Finances? N -  Housekeeping or managing your Housekeeping? N -  Some recent data might be hidden    Patient Care Team: Susy Frizzle, MD as PCP - General (Family Medicine) Satira Sark, MD as PCP - Cardiology (Cardiology)  Indicate any recent Medical Services you may have received from other than [facility] providers in the past year (date may be approximate).     Assessment:   This is a routine wellness examination for [place].  Hearing/Vision screen Hearing Screening - Comments:: No hearing issues. Vision Screening - Comments:: Readers. Dr. Kathlen Mody. 09/2020.  Dietary issues and exercise activities discussed: Current Exercise Habits: Home exercise routine, Type of exercise: walking, Time (Minutes): 30, Frequency (Times/Week): 5, Weekly Exercise (Minutes/Week): 150, Intensity: Mild, Exercise limited by: cardiac condition(s)   Goals Addressed             This Visit's Progress    Exercise 3x per week (30 min per time)       Attend local Y, has membership.       Depression Screen PHQ 2/9 Scores 12/18/2020 11/24/2020 04/23/2019 12/02/2017  PHQ - 2 Score 2 3 3 4   PHQ- 9 Score 5 11 14 6     Fall Risk  Fall Risk  12/18/2020 11/24/2020 12/02/2017 06/17/2016  Falls in the past year? 0 0 No No  Number falls in past yr: 0 0 - -  Injury with Fall? 0 0 - -  Risk for fall due to : Impaired vision No Fall Risks - -  Follow up  Falls prevention discussed Falls evaluation completed - -    FALL RISK PREVENTION PERTAINING TO THE HOME:  Any stairs in or around the home? No  If so, are there any without handrails? No  Home free of loose throw rugs in walkways, pet beds, electrical cords, etc? Yes  Adequate lighting in your home to reduce risk of falls? Yes   ASSISTIVE DEVICES UTILIZED TO PREVENT FALLS:  Life alert? No  Use of a cane, walker or w/c? No  Grab bars in the bathroom? No  Shower chair or bench in shower? No  Elevated toilet seat or a handicapped toilet? No   TIMED UP AND GO:  Was the test performed? Yes .  Length of time to ambulate 10 feet: 8 sec.   Gait steady and fast without use of assistive device  Cognitive Function:     6CIT Screen 12/18/2020  What Year? 0 points  What month? 0 points  What time? 0 points  Count back from 20 0 points  Months in reverse 0 points  Repeat phrase 0 points  Total Score 0    Immunizations Immunization History  Administered Date(s) Administered   Hep A / Hep B 01/08/2019, 02/16/2019, 07/09/2019   Influenza,inj,Quad PF,6+ Mos 03/13/2013, 11/26/2013, 01/12/2017, 12/21/2018   Influenza-Unspecified 12/13/2017   PFIZER(Purple Top)SARS-COV-2 Vaccination 04/04/2019, 04/25/2019   Tdap 10/09/2019    TDAP status: Up to date  Flu Vaccine status: Due, Education has been provided regarding the importance of this vaccine. Advised may receive this vaccine at local pharmacy or Health Dept. Aware to provide a copy of the vaccination record if obtained from local pharmacy or Health Dept. Verbalized acceptance and understanding.  Pneumococcal vaccine status: Due, Education has been provided regarding the importance of this vaccine. Advised may receive this vaccine at local pharmacy or Health Dept. Aware to provide a copy of the vaccination record if obtained from local pharmacy or Health Dept. Verbalized acceptance and understanding.  Covid-19 vaccine status:  Information provided on how to obtain vaccines.   Qualifies for Shingles Vaccine? Yes   Zostavax completed No   Shingrix Completed?: No.    Education has been provided regarding the importance of this vaccine. Patient has been advised to call insurance company to determine out of pocket expense if they have not yet received this vaccine. Advised may also receive vaccine at local pharmacy or Health Dept. Verbalized acceptance and understanding.  Screening Tests Health Maintenance  Topic Date Due   Zoster Vaccines- Shingrix (1 of 2) Never done   COVID-19 Vaccine (3 - Pfizer risk series) 05/23/2019   INFLUENZA VACCINE  10/13/2020   MAMMOGRAM  02/11/2022   COLONOSCOPY (Pts 45-73yrs Insurance coverage will need to be confirmed)  03/03/2022   TETANUS/TDAP  10/08/2029   DEXA SCAN  Completed   Hepatitis C Screening  Completed   HPV VACCINES  Aged Out    Health Maintenance  Health Maintenance Due  Topic Date Due   Zoster Vaccines- Shingrix (1 of 2) Never done   COVID-19 Vaccine (3 - Pfizer risk series) 05/23/2019   INFLUENZA VACCINE  10/13/2020    Colorectal cancer screening: Type of screening: Colonoscopy. Completed 03/03/2017. Repeat every 5  years  Mammogram status: Completed 02/12/2020. Repeat every year  Bone Density status: Completed 02/12/2020. Results reflect: Bone density results: OSTEOPENIA. Repeat every 2 years.  Lung Cancer Screening: (Low Dose CT Chest recommended if Age 35-80 years, 30 pack-year currently smoking OR have quit w/in 15years.) does qualify.     Additional Screening:  Hepatitis C Screening: does qualify; Completed 01/01/2019  Vision Screening: Recommended annual ophthalmology exams for early detection of glaucoma and other disorders of the eye. Is the patient up to date with their annual eye exam?  Yes  Who is the provider or what is the name of the office in which the patient attends annual eye exams? Dr. Kathlen Mody If pt is not established with a provider,  would they like to be referred to a provider to establish care? No .   Dental Screening: Recommended annual dental exams for proper oral hygiene  Community Resource Referral / Chronic Care Management: CRR required this visit?  No   CCM required this visit?  No      Plan:     I have personally reviewed and noted the following in the patient's chart:   Medical and social history Use of alcohol, tobacco or illicit drugs  Current medications and supplements including opioid prescriptions. Patient is not currently taking opioid prescriptions. Functional ability and status Nutritional status Physical activity Advanced directives List of other physicians Hospitalizations, surgeries, and ER visits in previous 12 months Vitals Screenings to include cognitive, depression, and falls Referrals and appointments  In addition, I have reviewed and discussed with patient certain preventive protocols, quality metrics, and best practice recommendations. A written personalized care plan for preventive services as well as general preventive health recommendations were provided to patient.     Chriss Driver, LPN   11/19/2834   Nurse Notes: Pt c/o issues with depression. Pt states that it is an ongoing issue for several years. Offered counseling services but pt declines at this time. Also discussed appointment with Dr. Dennard Schaumann. Pt states she will call  if sx get worse.

## 2020-12-18 NOTE — Patient Instructions (Signed)
Medication Instructions:  Your physician recommends that you continue on your current medications as directed. Please refer to the Current Medication list given to you today.  *If you need a refill on your cardiac medications before your next appointment, please call your pharmacy*   Lab Work: FASTING LIPID PANEL- 6 Months If you have labs (blood work) drawn today and your tests are completely normal, you will receive your results only by: Experiment (if you have MyChart) OR A paper copy in the mail If you have any lab test that is abnormal or we need to change your treatment, we will call you to review the results.   Testing/Procedures: None   Follow-Up: At [facility], you and your health needs are our priority.  As part of our continuing mission to provide you with exceptional heart care, we have created designated Provider Care Teams.  These Care Teams include your primary Cardiologist (physician) and Advanced Practice Providers (APPs -  Physician Assistants and Nurse Practitioners) who all work together to provide you with the care you need, when you need it.  We recommend signing up for the patient portal called "MyChart".  Sign up information is provided on this After Visit Summary.  MyChart is used to connect with patients for Virtual Visits (Telemedicine).  Patients are able to view lab/test results, encounter notes, upcoming appointments, etc.  Non-urgent messages can be sent to your provider as well.   To learn more about what you can do with MyChart, go to [email or web address].    Your next appointment:   Follow Up: AS NEEDED    Other Instructions

## 2020-12-19 ENCOUNTER — Other Ambulatory Visit: Payer: Self-pay | Admitting: Family Medicine

## 2020-12-19 MED ORDER — ALENDRONATE SODIUM 70 MG PO TABS: 70.0000 mg | ORAL_TABLET | ORAL | 11 refills | Status: DC

## 2020-12-25 DIAGNOSIS — X32XXXD Exposure to sunlight, subsequent encounter: Secondary | ICD-10-CM | POA: Diagnosis not present

## 2020-12-25 DIAGNOSIS — Z08 Encounter for follow-up examination after completed treatment for malignant neoplasm: Secondary | ICD-10-CM | POA: Diagnosis not present

## 2020-12-25 DIAGNOSIS — Z85828 Personal history of other malignant neoplasm of skin: Secondary | ICD-10-CM | POA: Diagnosis not present

## 2020-12-25 DIAGNOSIS — L570 Actinic keratosis: Secondary | ICD-10-CM | POA: Diagnosis not present

## 2021-01-08 ENCOUNTER — Other Ambulatory Visit: Payer: Self-pay

## 2021-01-08 ENCOUNTER — Ambulatory Visit: Payer: Medicare Other

## 2021-01-08 DIAGNOSIS — Z23 Encounter for immunization: Secondary | ICD-10-CM

## 2021-01-31 ENCOUNTER — Other Ambulatory Visit: Payer: Self-pay | Admitting: Gastroenterology

## 2021-02-01 ENCOUNTER — Other Ambulatory Visit: Payer: Self-pay | Admitting: Gastroenterology

## 2021-02-03 ENCOUNTER — Other Ambulatory Visit: Payer: Self-pay | Admitting: Obstetrics

## 2021-02-03 DIAGNOSIS — Z1231 Encounter for screening mammogram for malignant neoplasm of breast: Secondary | ICD-10-CM

## 2021-02-09 DIAGNOSIS — C44519 Basal cell carcinoma of skin of other part of trunk: Secondary | ICD-10-CM | POA: Diagnosis not present

## 2021-02-12 ENCOUNTER — Other Ambulatory Visit: Payer: Self-pay

## 2021-02-12 ENCOUNTER — Other Ambulatory Visit: Payer: Medicare Other

## 2021-02-12 DIAGNOSIS — E038 Other specified hypothyroidism: Secondary | ICD-10-CM

## 2021-02-13 LAB — TSH: TSH: 0.26 mIU/L — ABNORMAL LOW (ref 0.40–4.50)

## 2021-02-25 ENCOUNTER — Other Ambulatory Visit: Payer: Self-pay | Admitting: Gastroenterology

## 2021-02-25 ENCOUNTER — Other Ambulatory Visit: Payer: Self-pay | Admitting: Family Medicine

## 2021-02-25 DIAGNOSIS — E038 Other specified hypothyroidism: Secondary | ICD-10-CM

## 2021-03-24 ENCOUNTER — Ambulatory Visit
Admission: RE | Admit: 2021-03-24 | Discharge: 2021-03-24 | Disposition: A | Discharge status: Home / Self Care | Payer: Medicare Other | Source: Ambulatory Visit | Attending: Obstetrics | Admitting: Obstetrics

## 2021-03-24 DIAGNOSIS — Z1231 Encounter for screening mammogram for malignant neoplasm of breast: Secondary | ICD-10-CM | POA: Diagnosis not present

## 2021-06-03 ENCOUNTER — Telehealth: Payer: Self-pay | Admitting: Family Medicine

## 2021-06-03 NOTE — Telephone Encounter (Signed)
Pt is schedule for office visit with Dr. Dennard Schaumann on Monday  ?

## 2021-06-03 NOTE — Telephone Encounter (Signed)
Patient called to request for provider to put in an order for her blood sugar, cholesterol, and vitamin D levels to be tested when she comes in on Friday to have her thyroid level tested. ? ?Please advise at [phone].  ?

## 2021-06-05 ENCOUNTER — Other Ambulatory Visit: Payer: Medicare Other

## 2021-06-08 ENCOUNTER — Other Ambulatory Visit: Payer: Self-pay

## 2021-06-08 ENCOUNTER — Ambulatory Visit (INDEPENDENT_AMBULATORY_CARE_PROVIDER_SITE_OTHER): Payer: Medicare Other | Admitting: Family Medicine

## 2021-06-08 VITALS — BP 146/86 | HR 76 | Temp 97.6°F | Ht 64.0 in | Wt 237.6 lb

## 2021-06-08 DIAGNOSIS — D693 Immune thrombocytopenic purpura: Secondary | ICD-10-CM

## 2021-06-08 DIAGNOSIS — R682 Dry mouth, unspecified: Secondary | ICD-10-CM | POA: Diagnosis not present

## 2021-06-08 DIAGNOSIS — E038 Other specified hypothyroidism: Secondary | ICD-10-CM | POA: Diagnosis not present

## 2021-06-08 DIAGNOSIS — E559 Vitamin D deficiency, unspecified: Secondary | ICD-10-CM | POA: Diagnosis not present

## 2021-06-08 DIAGNOSIS — H04123 Dry eye syndrome of bilateral lacrimal glands: Secondary | ICD-10-CM

## 2021-06-08 DIAGNOSIS — E7800 Pure hypercholesterolemia, unspecified: Secondary | ICD-10-CM

## 2021-06-08 NOTE — Progress Notes (Signed)
? ?Subjective:  ? ? Patient ID: Amanda Hoover, female    DOB: 18-Jul-1955, 66 y.o.   MRN: [id number] ? ?HPI  ?Last TSH was 0.26 in December after having been > 9 previously.  Here today to recheck.  Currently on levothyroxine 100 mcg poqday.  Patient reports extreme dry mouth.  She reports dry eyes despite taking Pataday and eyedrops..  She is not taking any regular antihistamines.  She denies any diuretic.  She is drinking plenty of water.  Infectious had 2 bottles of water this morning as he feels like she has cottonmouth.  She does report polyuria.  Her blood sugar last year was slightly high at 109.  She also complains of motivation.  She states that her dentist recently told her that her "teeth are being resorbed."  He wonders what could be causing this although the dentist was concerned about her dry mouth as well. ?Past Medical History:  ?Diagnosis Date  ? Allergy 1975  ? Anxiety   ? Bilateral swelling of feet   ? Breast cancer ([place]) 12/17/2011  ?  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  ? Breast cancer, right ([place])   ? ER 0 PR0 LN neg.  ? Chest pain   ? a. false positive NST in 10/2020 with cath in 11/2020 showing only minimal CAD with 10% mid-LAD stenosis.  ? Chronic headaches   ? Chronic ITP (idiopathic thrombocytopenia) (HCC)   ? mild thrombocytopenia, normal spleen on [place] 11/18, mild cirrhosis  ? Cirrhosis ([place])   ? Dental crowns present   ? Depression   ? Fatty liver   ? GERD (gastroesophageal reflux disease)   ? Heart murmur   ? Hiatal hernia   ? History of colon polyps   ? History of esophageal stricture   ? Status post esophageal dilatation  ? History of radiation therapy 03/28/2012-05/17/2012  ? 60.4 gray to right breast  ? Hyperlipidemia   ? Hypothyroid   ? Multiple environmental allergies   ? Multiple food allergies   ? Osteopenia   ? Osteoporosis   ? Palpitations   ? Personal history of radiation therapy 2013  ? Right Breast Cancer  ? Rash 01/06/2012  ? right elbow  ? Rhinitis, allergic    ? rhinitis  ? ?Past Surgical History:  ?Procedure Laterality Date  ? BREAST LUMPECTOMY Right 2013  ? COLONOSCOPY    ? COLONOSCOPY W/ POLYPECTOMY    ? ESOPHAGOGASTRODUODENOSCOPY    ? LEFT HEART CATH AND CORONARY ANGIOGRAPHY N/A 11/19/2020  ? Procedure: LEFT HEART CATH AND CORONARY ANGIOGRAPHY;  Surgeon: Troy Sine, MD;  Location: [facility];  Service: Cardiovascular;  Laterality: N/A;  ? MASTECTOMY Right 2013  ? partial mast  ? PARTIAL MASTECTOMY WITH NEEDLE LOCALIZATION AND AXILLARY SENTINEL LYMPH NODE BX  01/12/2012  ? Procedure: PARTIAL MASTECTOMY WITH NEEDLE LOCALIZATION AND AXILLARY SENTINEL LYMPH NODE BX;  Surgeon: Adin Hector, MD;  Location: [facility];  Service: General;  Laterality: Right;  ? RE-EXCISION OF BREAST CANCER,SUPERIOR MARGINS  02/15/2012  ? Procedure: RE-EXCISION OF BREAST CANCER,SUPERIOR MARGINS;  Surgeon: Adin Hector, MD;  Location: [facility];  Service: General;  Laterality: Right;  Re-excision of right lumpectomy, close posterior margin.  ? ?Current Outpatient Medications on File Prior to Visit  ?Medication Sig Dispense Refill  ? ALPRAZolam (XANAX) 0.5 MG tablet Take 1 tablet (0.5 mg total) by mouth 3 (three) times daily as needed. 30 tablet 0  ? azithromycin (ZITHROMAX) 250 MG  tablet Take by mouth.    ? EPINEPHrine 0.3 mg/0.3 mL IJ SOAJ injection Inject 0.3 mg into the muscle as needed for anaphylaxis. 2 each 1  ? famotidine (PEPCID) 40 MG tablet Take 1 tablet (40 mg total) by mouth daily. 30 tablet 11  ? ibuprofen (ADVIL) 200 MG tablet Take 400-800 mg by mouth every 6 (six) hours as needed for headache or moderate pain.    ? levothyroxine (SYNTHROID) 100 MCG tablet TAKE 1 TABLET BY MOUTH DAILY BEFORE BREAKFAST 30 tablet 2  ? Methylcobalamin (B12-ACTIVE PO) Take 1 tablet by mouth daily.    ? Multiple Vitamin (MULTIVITAMIN) tablet Take 1 tablet by mouth daily.    ? Olopatadine HCl (PATADAY OP) Place 1 drop into both eyes daily as needed  (allergies).    ? pantoprazole (PROTONIX) 40 MG tablet TAKE 1 TABLET(40 MG) BY MOUTH DAILY 90 tablet 3  ? Polyethyl Glycol-Propyl Glycol (SYSTANE OP) Place 1 drop into both eyes daily as needed (dry eyes).    ? VITAMIN D PO Take 1 capsule by mouth daily.    ? alendronate (FOSAMAX) 70 MG tablet Take 1 tablet (70 mg total) by mouth every 7 (seven) days. Take with a full glass of water on an empty stomach. 4 tablet 11  ? clindamycin (CLEOCIN) 150 MG capsule     ? diphenhydrAMINE (BENADRYL) 25 MG tablet Take 25 mg by mouth at bedtime as needed for allergies or sleep.    ? ?Current Facility-Administered Medications on File Prior to Visit  ?Medication Dose Route Frequency Provider Last Rate Last Admin  ? sodium chloride flush (NS) 0.9 % injection 3 mL  3 mL Intravenous Q12H Satira Sark, MD      ? ?Allergies  ?Allergen Reactions  ? Macadamia Nut Oil Anaphylaxis  ? Other Swelling  ?  POPCORN:  SWELLING THROAT  ? Peanut-Containing Drug Products Swelling and Other (See Comments)  ?  THROAT TIGHTNESS; ALSO WALNUTS  ? Pistachio Nut (Diagnostic) Anaphylaxis  ? Tomato Swelling  ?  SWELLING UVULA  ? Adhesive [Tape] Other (See Comments)  ?  BLISTERS  ? Flour Other (See Comments)  ?  SNEEZING  ? Penicillins Hives  ? Oxycodone   ?  Unknown reaction  ? Sulfa Antibiotics Nausea Only  ? Vicodin [Hydrocodone-Acetaminophen] Hives and Swelling  ?  Pt had flushing, then itching and swelling of lips  ? Soap Other (See Comments)  ?  FRAGRANCES (SOAP/LOTION/PERFUME):  HEADACHE, RUNNY NOSE  ? Tramadol Nausea And Vomiting  ? ?Social History  ? ?Socioeconomic History  ? Marital status: Married  ?  Spouse name: Marguerite Olea  ? Number of children: 4  ? Years of education: Not on file  ? Highest education level: Not on file  ?Occupational History  ?  Employer: [employer]  ?  Comment: Department manager for [employer]  ?Tobacco Use  ? Smoking status: Former  ?  Packs/day: 0.50  ?  Years: 8.00  ?  Pack years: 4.00  ?  Types: Cigarettes  ?  Quit date:  03/15/2004  ?  Years since quitting: 17.2  ? Smokeless tobacco: Never  ?Vaping Use  ? Vaping Use: Never used  ?Substance and Sexual Activity  ? Alcohol use: No  ? Drug use: No  ?  Comment: quit smoking 11 years ago  ? Sexual activity: Yes  ?  Birth control/protection: Post-menopausal  ?Other Topics Concern  ? Not on file  ?Social History Narrative  ? 1 daughter and 3  sons  ? 5 grandchildren  ? ?Social Determinants of Health  ? ?Financial Resource Strain: Low Risk   ? Difficulty of Paying Living Expenses: Not hard at all  ?Food Insecurity: No Food Insecurity  ? Worried About Charity fundraiser in the Last Year: Never true  ? Ran Out of Food in the Last Year: Never true  ?Transportation Needs: No Transportation Needs  ? Lack of Transportation (Medical): No  ? Lack of Transportation (Non-Medical): No  ?Physical Activity: Sufficiently Active  ? Days of Exercise per Week: 5 days  ? Minutes of Exercise per Session: 30 min  ?Stress: No Stress Concern Present  ? Feeling of Stress : Only a little  ?Social Connections: Socially Integrated  ? Frequency of Communication with Friends and Family: More than three times a week  ? Frequency of Social Gatherings with Friends and Family: More than three times a week  ? Attends Religious Services: 1 to 4 times per year  ? Active Member of Clubs or Organizations: Yes  ? Attends Archivist Meetings: 1 to 4 times per year  ? Marital Status: Married  ?Intimate Partner Violence: Not At Risk  ? Fear of Current or Ex-Partner: No  ? Emotionally Abused: No  ? Physically Abused: No  ? Sexually Abused: No  ? ? ? ? ?Review of Systems ? ?   ?Objective:  ? Physical Exam ?Vitals reviewed.  ?Constitutional:   ?   General: She is not in acute distress. ?   Appearance: She is not diaphoretic.  ?HENT:  ?   Right Ear: Tympanic membrane, ear canal and external ear normal.  ?   Left Ear: Tympanic membrane, ear canal and external ear normal.  ?   Nose: No mucosal edema, congestion or rhinorrhea.  ?    Right Sinus: No maxillary sinus tenderness or frontal sinus tenderness.  ?   Left Sinus: No maxillary sinus tenderness or frontal sinus tenderness.  ?   Mouth/Throat:  ?   Pharynx: No oropharyngeal exu

## 2021-06-09 LAB — COMPLETE METABOLIC PANEL WITH GFR
AG Ratio: 1.5 (calc) (ref 1.0–2.5)
ALT: 34 U/L — ABNORMAL HIGH (ref 6–29)
AST: 39 U/L — ABNORMAL HIGH (ref 10–35)
Albumin: 4.7 g/dL (ref 3.6–5.1)
Alkaline phosphatase (APISO): 112 U/L (ref 37–153)
BUN: 12 mg/dL (ref 7–25)
CO2: 26 mmol/L (ref 20–32)
Calcium: 10.1 mg/dL (ref 8.6–10.4)
Chloride: 102 mmol/L (ref 98–110)
Creat: 0.91 mg/dL (ref 0.50–1.05)
Globulin: 3.1 g/dL (calc) (ref 1.9–3.7)
Glucose, Bld: 110 mg/dL — ABNORMAL HIGH (ref 65–99)
Potassium: 4.5 mmol/L (ref 3.5–5.3)
Sodium: 138 mmol/L (ref 135–146)
Total Bilirubin: 0.8 mg/dL (ref 0.2–1.2)
Total Protein: 7.8 g/dL (ref 6.1–8.1)
eGFR: 69 mL/min/{1.73_m2} (ref >=60)

## 2021-06-09 LAB — CBC WITH DIFFERENTIAL/PLATELET
Absolute Monocytes: 481 cells/uL (ref 200–950)
Basophils Absolute: 52 cells/uL (ref 0–200)
Basophils Relative: 0.9 %
Eosinophils Absolute: 139 cells/uL (ref 15–500)
Eosinophils Relative: 2.4 %
HCT: 44.8 % (ref 35.0–45.0)
Hemoglobin: 14.8 g/dL (ref 11.7–15.5)
Lymphs Abs: 1317 cells/uL (ref 850–3900)
MCH: 31.2 pg (ref 27.0–33.0)
MCHC: 33 g/dL (ref 32.0–36.0)
MCV: 94.3 fL (ref 80.0–100.0)
MPV: 11.4 fL (ref 7.5–12.5)
Monocytes Relative: 8.3 %
Neutro Abs: 3811 cells/uL (ref 1500–7800)
Neutrophils Relative %: 65.7 %
Platelets: 154 10*3/uL (ref 140–400)
RBC: 4.75 10*6/uL (ref 3.80–5.10)
RDW: 12.4 % (ref 11.0–15.0)
Total Lymphocyte: 22.7 %
WBC: 5.8 10*3/uL (ref 3.8–10.8)

## 2021-06-09 LAB — LIPID PANEL
Cholesterol: 216 mg/dL — ABNORMAL HIGH (ref <200)
HDL: 47 mg/dL — ABNORMAL LOW (ref >=50)
LDL Cholesterol (Calc): 138 mg/dL (calc) — ABNORMAL HIGH
Non-HDL Cholesterol (Calc): 169 mg/dL (calc) — ABNORMAL HIGH (ref <130)
Total CHOL/HDL Ratio: 4.6 (calc) (ref <5.0)
Triglycerides: 172 mg/dL — ABNORMAL HIGH (ref <150)

## 2021-06-09 LAB — TSH: TSH: 0.25 mIU/L — ABNORMAL LOW (ref 0.40–4.50)

## 2021-06-09 LAB — VITAMIN D 25 HYDROXY (VIT D DEFICIENCY, FRACTURES): Vit D, 25-Hydroxy: 49 ng/mL (ref 30–100)

## 2021-06-09 LAB — SJOGREN'S SYNDROME ANTIBODS(SSA + SSB)
SSA (Ro) (ENA) Antibody, IgG: <1 AI
SSB (La) (ENA) Antibody, IgG: <1 AI

## 2021-06-12 ENCOUNTER — Other Ambulatory Visit: Payer: Self-pay

## 2021-06-12 MED ORDER — PANTOPRAZOLE SODIUM 40 MG PO TBEC: DELAYED_RELEASE_TABLET | ORAL | 3 refills | Status: DC

## 2021-06-15 ENCOUNTER — Telehealth: Payer: Self-pay

## 2021-06-15 DIAGNOSIS — E038 Other specified hypothyroidism: Secondary | ICD-10-CM

## 2021-06-15 MED ORDER — LEVOTHYROXINE SODIUM 88 MCG PO TABS: 88.0000 ug | ORAL_TABLET | Freq: Every day | ORAL | 3 refills | Status: DC

## 2021-06-15 NOTE — Telephone Encounter (Signed)
GIVEN PT rx ?

## 2021-08-12 DIAGNOSIS — G43B0 Ophthalmoplegic migraine, not intractable: Secondary | ICD-10-CM | POA: Diagnosis not present

## 2021-08-24 DIAGNOSIS — D225 Melanocytic nevi of trunk: Secondary | ICD-10-CM | POA: Diagnosis not present

## 2021-08-24 DIAGNOSIS — Z08 Encounter for follow-up examination after completed treatment for malignant neoplasm: Secondary | ICD-10-CM | POA: Diagnosis not present

## 2021-08-24 DIAGNOSIS — Z85828 Personal history of other malignant neoplasm of skin: Secondary | ICD-10-CM | POA: Diagnosis not present

## 2021-08-24 DIAGNOSIS — L02223 Furuncle of chest wall: Secondary | ICD-10-CM | POA: Diagnosis not present

## 2021-08-28 DIAGNOSIS — H53032 Strabismic amblyopia, left eye: Secondary | ICD-10-CM | POA: Diagnosis not present

## 2021-08-28 DIAGNOSIS — H04123 Dry eye syndrome of bilateral lacrimal glands: Secondary | ICD-10-CM | POA: Diagnosis not present

## 2021-08-28 DIAGNOSIS — G43B0 Ophthalmoplegic migraine, not intractable: Secondary | ICD-10-CM | POA: Diagnosis not present

## 2021-08-28 DIAGNOSIS — H25813 Combined forms of age-related cataract, bilateral: Secondary | ICD-10-CM | POA: Diagnosis not present

## 2021-09-08 ENCOUNTER — Other Ambulatory Visit: Payer: Medicare Other

## 2021-09-08 DIAGNOSIS — E038 Other specified hypothyroidism: Secondary | ICD-10-CM | POA: Diagnosis not present

## 2021-09-09 LAB — TSH: TSH: 4.54 mIU/L — ABNORMAL HIGH (ref 0.40–4.50)

## 2021-10-19 ENCOUNTER — Other Ambulatory Visit: Payer: Self-pay | Admitting: Family Medicine

## 2021-10-19 ENCOUNTER — Other Ambulatory Visit: Payer: Medicare Other

## 2021-10-19 ENCOUNTER — Telehealth: Payer: Self-pay

## 2021-10-19 DIAGNOSIS — N390 Urinary tract infection, site not specified: Secondary | ICD-10-CM | POA: Diagnosis not present

## 2021-10-19 LAB — MICROSCOPIC MESSAGE

## 2021-10-19 LAB — URINALYSIS, ROUTINE W REFLEX MICROSCOPIC
Bilirubin Urine: NEGATIVE
Glucose, UA: NEGATIVE
Hyaline Cast: NONE SEEN /LPF
Ketones, ur: NEGATIVE
Nitrite: POSITIVE — AB
Protein, ur: NEGATIVE
Specific Gravity, Urine: 1.01 (ref 1.001–1.035)
pH: 6.5 (ref 5.0–8.0)

## 2021-10-19 MED ORDER — NITROFURANTOIN MONOHYD MACRO 100 MG PO CAPS: 100.0000 mg | ORAL_CAPSULE | Freq: Two times a day (BID) | ORAL | 0 refills | Status: DC

## 2021-10-19 NOTE — Telephone Encounter (Signed)
Pt called in stating that she feels as if she has a UTI and wanted to drop off a urine sample to be tested.

## 2021-10-19 NOTE — Telephone Encounter (Signed)
Call pt and tell her to drop it off.

## 2021-10-21 ENCOUNTER — Encounter (INDEPENDENT_AMBULATORY_CARE_PROVIDER_SITE_OTHER): Payer: Self-pay

## 2021-11-05 DIAGNOSIS — H524 Presbyopia: Secondary | ICD-10-CM | POA: Diagnosis not present

## 2021-11-17 NOTE — Progress Notes (Unsigned)
Subjective:    Patient ID: Amanda Hoover, female    DOB: February 01, 1956, 66 y.o.   MRN: [id number]  HPI  Amanda Hoover presents today with complaints of *** for ***. She was recently treated with Macrobid for UTI on 10/19/2021, no culture sent at that time. Today's UA shows ***.   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   Breast cancer, right ([place])    ER 0 PR0 LN neg.   Chest pain    a. false positive NST in 10/2020 with cath in 11/2020 showing only minimal CAD with 10% mid-LAD stenosis.   Chronic headaches    Chronic ITP (idiopathic thrombocytopenia) (HCC)    mild thrombocytopenia, normal spleen on [place] 11/18, mild cirrhosis   Cirrhosis ([place])    Dental crowns present    Depression    Fatty liver    GERD (gastroesophageal reflux disease)    Heart murmur    Hiatal hernia    History of colon polyps    History of esophageal stricture    Status post esophageal dilatation   History of radiation therapy 03/28/2012-05/17/2012   60.4 gray to right breast   Hyperlipidemia    Hypothyroid    Multiple environmental allergies    Multiple food allergies    Osteopenia    Osteoporosis    Palpitations    Personal history of radiation therapy 2013   Right Breast Cancer   Rash 01/06/2012   right elbow   Rhinitis, allergic    rhinitis   Past Surgical History:  Procedure Laterality Date   BREAST LUMPECTOMY Right 2013   COLONOSCOPY     COLONOSCOPY W/ POLYPECTOMY     ESOPHAGOGASTRODUODENOSCOPY     LEFT HEART CATH AND CORONARY ANGIOGRAPHY N/A 11/19/2020   Procedure: LEFT HEART CATH AND CORONARY ANGIOGRAPHY;  Surgeon: Troy Sine, MD;  Location: [facility];  Service: Cardiovascular;  Laterality: N/A;   MASTECTOMY Right 2013   partial mast   PARTIAL MASTECTOMY WITH NEEDLE LOCALIZATION AND AXILLARY SENTINEL LYMPH NODE BX  01/12/2012   Procedure:  PARTIAL MASTECTOMY WITH NEEDLE LOCALIZATION AND AXILLARY SENTINEL LYMPH NODE BX;  Surgeon: Adin Hector, MD;  Location: [facility];  Service: General;  Laterality: Right;   RE-EXCISION OF BREAST CANCER,SUPERIOR MARGINS  02/15/2012   Procedure: RE-EXCISION OF BREAST CANCER,SUPERIOR MARGINS;  Surgeon: Adin Hector, MD;  Location: [facility];  Service: General;  Laterality: Right;  Re-excision of right lumpectomy, close posterior margin.   Current Outpatient Medications on File Prior to Visit  Medication Sig Dispense Refill   alendronate (FOSAMAX) 70 MG tablet Take 1 tablet (70 mg total) by mouth every 7 (seven) days. Take with a full glass of water on an empty stomach. 4 tablet 11   ALPRAZolam (XANAX) 0.5 MG tablet Take 1 tablet (0.5 mg total) by mouth 3 (three) times daily as needed. 30 tablet 0   clindamycin (CLEOCIN) 150 MG capsule      diphenhydrAMINE (BENADRYL) 25 MG tablet Take 25 mg by mouth at bedtime as needed for allergies or sleep.     EPINEPHrine 0.3 mg/0.3 mL IJ SOAJ injection Inject 0.3 mg into the muscle as needed for anaphylaxis. 2 each 1   famotidine (PEPCID) 40 MG tablet Take 1 tablet (40 mg total) by mouth daily. 30 tablet 11  ibuprofen (ADVIL) 200 MG tablet Take 400-800 mg by mouth every 6 (six) hours as needed for headache or moderate pain.     levothyroxine (SYNTHROID) 88 MCG tablet Take 1 tablet (88 mcg total) by mouth daily. 90 tablet 3   Methylcobalamin (B12-ACTIVE PO) Take 1 tablet by mouth daily.     Multiple Vitamin (MULTIVITAMIN) tablet Take 1 tablet by mouth daily.     nitrofurantoin, macrocrystal-monohydrate, (MACROBID) 100 MG capsule Take 1 capsule (100 mg total) by mouth 2 (two) times daily. 14 capsule 0   Olopatadine HCl (PATADAY OP) Place 1 drop into both eyes daily as needed (allergies).     pantoprazole (PROTONIX) 40 MG tablet TAKE 1 TABLET(40 MG) BY MOUTH DAILY 90 tablet 3   Polyethyl Glycol-Propyl Glycol (SYSTANE OP) Place  1 drop into both eyes daily as needed (dry eyes).     VITAMIN D PO Take 1 capsule by mouth daily.     Current Facility-Administered Medications on File Prior to Visit  Medication Dose Route Frequency Provider Last Rate Last Admin   sodium chloride flush (NS) 0.9 % injection 3 mL  3 mL Intravenous Q12H Satira Sark, MD       Allergies  Allergen Reactions   Macadamia Nut Oil Anaphylaxis   Other Swelling    POPCORN:  SWELLING THROAT   Peanut-Containing Drug Products Swelling and Other (See Comments)    THROAT TIGHTNESS; ALSO WALNUTS   Pistachio Nut (Diagnostic) Anaphylaxis   Tomato Swelling    SWELLING UVULA   Adhesive [Tape] Other (See Comments)    BLISTERS   Flour Other (See Comments)    SNEEZING   Penicillins Hives   Oxycodone     Unknown reaction   Sulfa Antibiotics Nausea Only   Vicodin [Hydrocodone-Acetaminophen] Hives and Swelling    Pt had flushing, then itching and swelling of lips   Soap Other (See Comments)    FRAGRANCES (SOAP/LOTION/PERFUME):  HEADACHE, RUNNY NOSE   Tramadol Nausea And Vomiting      Review of Systems     Objective:   Physical Exam        Assessment & Plan:

## 2021-11-18 ENCOUNTER — Ambulatory Visit (INDEPENDENT_AMBULATORY_CARE_PROVIDER_SITE_OTHER): Payer: Medicare Other | Admitting: Family Medicine

## 2021-11-18 ENCOUNTER — Encounter: Payer: Self-pay | Admitting: Family Medicine

## 2021-11-18 VITALS — BP 122/82 | HR 81 | Temp 97.6°F | Ht 64.0 in | Wt 232.0 lb

## 2021-11-18 DIAGNOSIS — B356 Tinea cruris: Secondary | ICD-10-CM

## 2021-11-18 DIAGNOSIS — R300 Dysuria: Secondary | ICD-10-CM | POA: Diagnosis not present

## 2021-11-18 LAB — URINALYSIS, ROUTINE W REFLEX MICROSCOPIC
Bilirubin Urine: NEGATIVE
Glucose, UA: NEGATIVE
Hgb urine dipstick: NEGATIVE
Ketones, ur: NEGATIVE
Leukocytes,Ua: NEGATIVE
Nitrite: NEGATIVE
Protein, ur: NEGATIVE
Specific Gravity, Urine: 1.015 (ref 1.001–1.035)
pH: 7 (ref 5.0–8.0)

## 2021-11-18 MED ORDER — TERBINAFINE HCL 1 % EX CREA
TOPICAL_CREAM | Freq: Two times a day (BID) | CUTANEOUS | Status: AC
Start: 2021-11-18 — End: 2021-12-02

## 2021-11-18 NOTE — Addendum Note (Signed)
Addended by: Rubie Maid on: 11/18/2021 11:07 AM   Modules accepted: Orders

## 2021-11-26 ENCOUNTER — Ambulatory Visit (INDEPENDENT_AMBULATORY_CARE_PROVIDER_SITE_OTHER): Payer: Medicare Other | Admitting: Family Medicine

## 2021-11-26 VITALS — BP 118/72 | HR 70 | Temp 98.4°F | Ht 64.0 in | Wt 234.6 lb

## 2021-11-26 DIAGNOSIS — B372 Candidiasis of skin and nail: Secondary | ICD-10-CM

## 2021-11-26 MED ORDER — CLOTRIMAZOLE-BETAMETHASONE 1-0.05 % EX CREA: 1.0000 | TOPICAL_CREAM | Freq: Two times a day (BID) | CUTANEOUS | 0 refills | Status: DC

## 2021-11-26 NOTE — Progress Notes (Signed)
[place] Subjective:    Patient ID: Amanda Hoover, female    DOB: [birth year], 66 y.o.   MRN: [id number]  Patient recently went to the beach.  It was extremely hot at the beach.  She was sweating a lot.  She was also wearing a bed and stayed most often when due to swimming in the ocean.  She subsequently developed a rash on her upper inner thigh adjacent to her perineum.  On her left upper inner thigh there is a violaceous color patch with a sharp serpiginous border consistent with Candida intertrigo.  There is a similar rash on her upper right inner thigh.  They do not hurt.  There may be some slight itching.  She has tried topical antibiotic ointment without any relief.  She is here today for follow-up. 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   Breast cancer, right ([place])    ER 0 PR0 LN neg.   Chest pain    a. false positive NST in 10/2020 with cath in 11/2020 showing only minimal CAD with 10% mid-LAD stenosis.   Chronic headaches    Chronic ITP (idiopathic thrombocytopenia) ([facility])    mild thrombocytopenia, normal spleen on [place] 11/18, mild cirrhosis   Cirrhosis ([place])    Dental crowns present    Depression    Fatty liver    GERD (gastroesophageal reflux disease)    Heart murmur    Hiatal hernia    History of colon polyps    History of esophageal stricture    Status post esophageal dilatation   History of radiation therapy 03/28/2012-05/17/2012   60.4 gray to right breast   Hyperlipidemia    Hypothyroid    Multiple environmental allergies    Multiple food allergies    Osteopenia    Osteoporosis    Palpitations    Personal history of radiation therapy 2013   Right Breast Cancer   Rash 01/06/2012   right elbow   Rhinitis, allergic    rhinitis   Past Surgical History:  Procedure Laterality Date   BREAST LUMPECTOMY Right 2013   COLONOSCOPY      COLONOSCOPY W/ POLYPECTOMY     ESOPHAGOGASTRODUODENOSCOPY     LEFT HEART CATH AND CORONARY ANGIOGRAPHY N/A 11/19/2020   Procedure: LEFT HEART CATH AND CORONARY ANGIOGRAPHY;  Surgeon: Troy Sine, MD;  Location: [facility];  Service: Cardiovascular;  Laterality: N/A;   MASTECTOMY Right 2013   partial mast   PARTIAL MASTECTOMY WITH NEEDLE LOCALIZATION AND AXILLARY SENTINEL LYMPH NODE BX  01/12/2012   Procedure: PARTIAL MASTECTOMY WITH NEEDLE LOCALIZATION AND AXILLARY SENTINEL LYMPH NODE BX;  Surgeon: Adin Hector, MD;  Location: [place];  Service: General;  Laterality: Right;   RE-EXCISION OF BREAST CANCER,SUPERIOR MARGINS  02/15/2012   Procedure: RE-EXCISION OF BREAST CANCER,SUPERIOR MARGINS;  Surgeon: Adin Hector, MD;  Location: [place];  Service: General;  Laterality: Right;  Re-excision of right lumpectomy, close posterior margin.   Current Outpatient Medications on File Prior to Visit  Medication Sig Dispense Refill   ALPRAZolam (XANAX) 0.5 MG tablet Take 1 tablet (0.5 mg total) by mouth 3 (three) times daily as needed. 30 tablet 0   diphenhydrAMINE (BENADRYL) 25 MG tablet Take 25 mg by mouth at bedtime as needed for allergies or sleep.  EPINEPHrine 0.3 mg/0.3 mL IJ SOAJ injection Inject 0.3 mg into the muscle as needed for anaphylaxis. 2 each 1   ibuprofen (ADVIL) 200 MG tablet Take 400-800 mg by mouth every 6 (six) hours as needed for headache or moderate pain.     levothyroxine (SYNTHROID) 88 MCG tablet Take 1 tablet (88 mcg total) by mouth daily. 90 tablet 3   Methylcobalamin (B12-ACTIVE PO) Take 1 tablet by mouth daily.     Multiple Vitamin (MULTIVITAMIN) tablet Take 1 tablet by mouth daily.     Olopatadine HCl (PATADAY OP) Place 1 drop into both eyes daily as needed (allergies).     pantoprazole (PROTONIX) 40 MG tablet TAKE 1 TABLET(40 MG) BY MOUTH DAILY 90 tablet 3   Polyethyl Glycol-Propyl Glycol (SYSTANE OP) Place 1 drop  into both eyes daily as needed (dry eyes).     VITAMIN D PO Take 1 capsule by mouth daily.     alendronate (FOSAMAX) 70 MG tablet Take 1 tablet (70 mg total) by mouth every 7 (seven) days. Take with a full glass of water on an empty stomach. (Patient not taking: Reported on 11/18/2021) 4 tablet 11   clindamycin (CLEOCIN) 150 MG capsule  (Patient not taking: Reported on 11/18/2021)     famotidine (PEPCID) 40 MG tablet Take 1 tablet (40 mg total) by mouth daily. (Patient not taking: Reported on 11/18/2021) 30 tablet 11   nitrofurantoin, macrocrystal-monohydrate, (MACROBID) 100 MG capsule Take 1 capsule (100 mg total) by mouth 2 (two) times daily. (Patient not taking: Reported on 11/18/2021) 14 capsule 0   Current Facility-Administered Medications on File Prior to Visit  Medication Dose Route Frequency Provider Last Rate Last Admin   sodium chloride flush (NS) 0.9 % injection 3 mL  3 mL Intravenous Q12H Satira Sark, MD       terbinafine (LAMISIL) 1 % cream   Topical BID Mila Merry S, FNP       Allergies  Allergen Reactions   Macadamia Nut Oil Anaphylaxis   Other Swelling    POPCORN:  SWELLING THROAT   Peanut-Containing Drug Products Swelling and Other (See Comments)    THROAT TIGHTNESS; ALSO WALNUTS   Pistachio Nut (Diagnostic) Anaphylaxis   Tomato Swelling    SWELLING UVULA   Adhesive [Tape] Other (See Comments)    BLISTERS   Flour Other (See Comments)    SNEEZING   Penicillins Hives   Oxycodone     Unknown reaction   Sulfa Antibiotics Nausea Only   Vicodin [Hydrocodone-Acetaminophen] Hives and Swelling    Pt had flushing, then itching and swelling of lips   Soap Other (See Comments)    FRAGRANCES (SOAP/LOTION/PERFUME):  HEADACHE, RUNNY NOSE   Tramadol Nausea And Vomiting   Social History   Socioeconomic History   Marital status: Married    Spouse name: Darrell   Number of children: 4   Years of education: Not on file   Highest education level: Not on file  Occupational  History    Employer: [employer]    Comment: Teacher, English as a foreign language for [employer]  Tobacco Use   Smoking status: Former    Packs/day: 0.50    Years: 8.00    Total pack years: 4.00    Types: Cigarettes    Quit date: 03/15/2004    Years since quitting: 17.7   Smokeless tobacco: Never  Vaping Use   Vaping Use: Never used  Substance and Sexual Activity   Alcohol use: No   Drug use: No  Comment: quit smoking 11 years ago   Sexual activity: Yes    Birth control/protection: Post-menopausal  Other Topics Concern   Not on file  Social History Narrative   1 daughter and 3 sons   5 grandchildren   Social Determinants of Health   Financial Resource Strain: Low Risk  (12/18/2020)   Overall Financial Resource Strain (CARDIA)    Difficulty of Paying Living Expenses: Not hard at all  Food Insecurity: No Food Insecurity (12/18/2020)   Hunger Vital Sign    Worried About Running Out of Food in the Last Year: Never true    [place] in the Last Year: Never true  Transportation Needs: No Transportation Needs (12/18/2020)   PRAPARE - Hydrologist (Medical): No    Lack of Transportation (Non-Medical): No  Physical Activity: Sufficiently Active (12/18/2020)   Exercise Vital Sign    Days of Exercise per Week: 5 days    Minutes of Exercise per Session: 30 min  Stress: No Stress Concern Present (12/18/2020)   [place]    Feeling of Stress : Only a little  Social Connections: Socially Integrated (12/18/2020)   Social Connection and Isolation Panel [NHANES]    Frequency of Communication with Friends and Family: More than three times a week    Frequency of Social Gatherings with Friends and Family: More than three times a week    Attends Religious Services: 1 to 4 times per year    Active Member of [employer] or Organizations: Yes    Attends Archivist Meetings: 1 to 4 times per year    Marital  Status: Married  Human resources officer Violence: Not At Risk (12/18/2020)   Humiliation, Afraid, Rape, and Kick questionnaire    Fear of Current or Ex-Partner: No    Emotionally Abused: No    Physically Abused: No    Sexually Abused: No      Review of Systems     Objective:   Physical Exam Vitals reviewed.  Constitutional:      General: She is not in acute distress.    Appearance: She is not diaphoretic.  HENT:     Nose: No mucosal edema.     Right Sinus: No maxillary sinus tenderness or frontal sinus tenderness.     Left Sinus: No maxillary sinus tenderness or frontal sinus tenderness.  Neck:     Thyroid: No thyroid mass, thyromegaly or thyroid tenderness.  Cardiovascular:     Rate and Rhythm: Normal rate and regular rhythm.     Heart sounds: Normal heart sounds. No murmur heard.    No friction rub. No gallop.  Pulmonary:     Effort: Pulmonary effort is normal. No respiratory distress.     Breath sounds: Normal breath sounds. No stridor. No wheezing or rales.  Musculoskeletal:     Cervical back: Full passive range of motion without pain. No edema or erythema. No pain with movement or muscular tenderness. Normal range of motion.  Skin:    Findings: Erythema and rash present.                Assessment & Plan:  Candidal intertrigo Patient has Candida intertrigo.  Use Lotrisone cream twice daily for 10 to 14 days.  Recommended keeping the area dry.  Also recommended allowing the skin in that area to air out as much as possible by wearing loosefitting clothing and breathable fabric.

## 2022-01-04 ENCOUNTER — Ambulatory Visit (INDEPENDENT_AMBULATORY_CARE_PROVIDER_SITE_OTHER): Payer: Medicare Other | Admitting: Family Medicine

## 2022-01-04 ENCOUNTER — Other Ambulatory Visit (INDEPENDENT_AMBULATORY_CARE_PROVIDER_SITE_OTHER): Payer: Medicare Other

## 2022-01-04 ENCOUNTER — Encounter: Payer: Self-pay | Admitting: Family Medicine

## 2022-01-04 VITALS — BP 118/92 | HR 87 | Ht 64.0 in

## 2022-01-04 DIAGNOSIS — Z23 Encounter for immunization: Secondary | ICD-10-CM

## 2022-01-04 DIAGNOSIS — Z0000 Encounter for general adult medical examination without abnormal findings: Secondary | ICD-10-CM

## 2022-01-04 NOTE — Progress Notes (Signed)
Subjective:   Amanda Hoover is a 66 y.o. female who presents for Medicare Annual (Subsequent) preventive examination.  Review of Systems     Cardiac Risk Factors include: advanced age (>5mn, >>54women);obesity (BMI >30kg/m2)     Objective:    Today's Vitals   01/04/22 1559  BP: (!) 118/92  Pulse: 87  SpO2: 98%  Height: '5\' 4"'$  (1.626 m)   Body mass index is 40.27 kg/m.     01/04/2022    3:37 PM 12/18/2020    9:17 AM 11/19/2020    6:52 AM 09/28/2020    2:08 PM 03/02/2019    6:35 PM 04/08/2018    4:44 PM 02/21/2018    4:56 PM  Advanced Directives  Does Patient Have a Medical Advance Directive? Yes No No No No No Yes  Does patient want to make changes to medical advance directive?       No - Patient declined  Would patient like information on creating a medical advance directive?  No - Patient declined No - Patient declined No - Patient declined  No - Patient declined     Current Medications (verified) Outpatient Encounter Medications as of 01/04/2022  Medication Sig   ALPRAZolam (XANAX) 0.5 MG tablet Take 1 tablet (0.5 mg total) by mouth 3 (three) times daily as needed.   clotrimazole-betamethasone (LOTRISONE) cream Apply 1 Application topically 2 (two) times daily.   EPINEPHrine 0.3 mg/0.3 mL IJ SOAJ injection Inject 0.3 mg into the muscle as needed for anaphylaxis.   ibuprofen (ADVIL) 200 MG tablet Take 400-800 mg by mouth every 6 (six) hours as needed for headache or moderate pain.   levothyroxine (SYNTHROID) 88 MCG tablet Take 1 tablet (88 mcg total) by mouth daily.   Methylcobalamin (B12-ACTIVE PO) Take 1 tablet by mouth daily.   Multiple Vitamin (MULTIVITAMIN) tablet Take 1 tablet by mouth daily.   nitrofurantoin, macrocrystal-monohydrate, (MACROBID) 100 MG capsule Take 1 capsule (100 mg total) by mouth 2 (two) times daily.   Olopatadine HCl (PATADAY OP) Place 1 drop into both eyes daily as needed (allergies).   pantoprazole (PROTONIX) 40 MG tablet TAKE 1 TABLET(40  MG) BY MOUTH DAILY   Polyethyl Glycol-Propyl Glycol (SYSTANE OP) Place 1 drop into both eyes daily as needed (dry eyes).   VITAMIN D PO Take 1 capsule by mouth daily.   [DISCONTINUED] alendronate (FOSAMAX) 70 MG tablet Take 1 tablet (70 mg total) by mouth every 7 (seven) days. Take with a full glass of water on an empty stomach. (Patient not taking: Reported on 11/18/2021)   [DISCONTINUED] clindamycin (CLEOCIN) 150 MG capsule  (Patient not taking: Reported on 11/18/2021)   [DISCONTINUED] diphenhydrAMINE (BENADRYL) 25 MG tablet Take 25 mg by mouth at bedtime as needed for allergies or sleep.   [DISCONTINUED] famotidine (PEPCID) 40 MG tablet Take 1 tablet (40 mg total) by mouth daily. 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   [place]    ER 0 PR0 LN neg.  Chest pain    a. false positive NST in 10/2020 with cath in 11/2020 showing only minimal CAD with 10% mid-LAD stenosis.   Chronic headaches    Chronic ITP (idiopathic thrombocytopenia) (HCC)    mild thrombocytopenia, normal spleen on [place] 11/18, mild cirrhosis   Cirrhosis ([place])    Dental crowns present    Depression    Fatty liver    GERD (gastroesophageal reflux disease)    Heart murmur    Hiatal hernia    History of colon polyps    History of esophageal stricture    Status post esophageal dilatation   History of radiation therapy 03/28/2012-05/17/2012   60.4 gray  to right breast   Hyperlipidemia    Hypothyroid    Multiple environmental allergies    Multiple food allergies    Osteopenia    Osteoporosis    Palpitations    Personal history of radiation therapy 2013   Right Breast Cancer   Rash 01/06/2012   right elbow   Rhinitis, allergic    rhinitis   Past Surgical History:  Procedure Laterality Date   BREAST LUMPECTOMY Right 2013   COLONOSCOPY     COLONOSCOPY W/ POLYPECTOMY     ESOPHAGOGASTRODUODENOSCOPY     LEFT HEART CATH AND CORONARY ANGIOGRAPHY N/A 11/19/2020   Procedure: LEFT HEART CATH AND CORONARY ANGIOGRAPHY;  Surgeon: Troy Sine, MD;  Location: [facility];  Service: Cardiovascular;  Laterality: N/A;   MASTECTOMY Right 2013   partial mast   PARTIAL MASTECTOMY WITH NEEDLE LOCALIZATION AND AXILLARY SENTINEL LYMPH NODE BX  01/12/2012   Procedure: PARTIAL MASTECTOMY WITH NEEDLE LOCALIZATION AND AXILLARY SENTINEL LYMPH NODE BX;  Surgeon: Adin Hector, MD;  Location: [facility];  Service: General;  Laterality: Right;   RE-EXCISION OF BREAST CANCER,SUPERIOR MARGINS  02/15/2012   Procedure: RE-EXCISION OF BREAST CANCER,SUPERIOR MARGINS;  Surgeon: Adin Hector, MD;  Location: [facility];  Service: General;  Laterality: Right;  Re-excision of right lumpectomy, close posterior margin.   Family History  Problem Relation Age of Onset   Crohn's disease Mother    Irritable bowel syndrome Mother    Hypertension Mother    Hyperlipidemia Mother    Stroke Mother    Thyroid disease Mother    Depression Mother    Esophageal cancer Father 79   Cancer Father    Breast cancer Paternal Aunt        dx in her early 75s   Diabetes Maternal Grandmother    Breast cancer Paternal Grandmother 82   Diabetes Paternal Grandmother    Cancer Paternal Grandmother    Cancer Maternal Uncle        unknown form of cancer   Melanoma Cousin        maternal cousin dx in her 62s   Breast cancer Other    Liver  disease Other    Colon cancer Neg Hx    Rectal cancer Neg Hx    Stomach cancer Neg Hx    Social History   Socioeconomic History   Marital status: Married    Spouse name: Darrell   Number of children: 4   Years of education: Not on file   Highest education level: Not on file  Occupational History    Employer: [employer]    Comment: Teacher, English as a foreign language for [employer]  Tobacco Use   Smoking status: Former    Packs/day: 0.50    Years: 8.00    Total pack  years: 4.00    Types: Cigarettes    Quit date: 03/15/2004    Years since quitting: 17.8   Smokeless tobacco: Never  Vaping Use   Vaping Use: Never used  Substance and Sexual Activity   Alcohol use: No   Drug use: No    Comment: quit smoking 11 years ago   Sexual activity: Yes    Birth control/protection: Post-menopausal  Other Topics Concern   Not on file  Social History Narrative   1 daughter and 3 sons   5 grandchildren   Social Determinants of Health   Financial Resource Strain: Low Risk  (01/04/2022)   Overall Financial Resource Strain (CARDIA)    Difficulty of Paying Living Expenses: Not hard at all  Food Insecurity: No Food Insecurity (01/04/2022)   Hunger Vital Sign    Worried About Running Out of Food in the Last Year: Never true    Ran Out of Food in the Last Year: Never true  Transportation Needs: No Transportation Needs (01/04/2022)   PRAPARE - Hydrologist (Medical): No    Lack of Transportation (Non-Medical): No  Physical Activity: Inactive (01/04/2022)   Exercise Vital Sign    Days of Exercise per Week: 0 days    Minutes of Exercise per Session: 0 min  Stress: Stress Concern Present (01/04/2022)   [place]    Feeling of Stress : To some extent  Social Connections: Moderately Isolated (01/04/2022)   Social Connection and Isolation Panel [NHANES]    Frequency of Communication with Friends and Family: Once a  week    Frequency of Social Gatherings with Friends and Family: Once a week    Attends Religious Services: 1 to 4 times per year    Active Member of [employer] or Organizations: No    Attends Music therapist: Never    Marital Status: Married    Tobacco Counseling Counseling given: Not Answered   Clinical Intake:  Pre-visit preparation completed: Yes  Pain : No/denies pain (just been having headaches/take ibuprophen)     BMI - recorded: 40.25 Nutritional Status: BMI > 30  Obese Diabetes: No (just have boarderline BS)  How often do you need to have someone help you when you read instructions, pamphlets, or other written materials from your doctor or pharmacy?: 1 - Never What is the last grade level you completed in school?: 14  Diabetic?no  Interpreter Needed?: No      Activities of Daily Living    01/04/2022    3:39 PM  In your present state of health, do you have any difficulty performing the following activities:  Hearing? 0  Vision? 1  Comment have glasses for reading/distance  Difficulty concentrating or making decisions? 1  Comment sometimes w/recalling  Walking or climbing stairs? 1  Comment only coming down from the stairs due right knee  Dressing or bathing? 0  Doing errands, shopping? 0  Preparing Food and eating ? N  Using the Toilet? N  In the past six months, have you accidently leaked urine? Y  Do you have problems with loss of bowel control? N  Managing your Medications? N  Managing your Finances? N  Housekeeping or managing your Housekeeping? N    Patient Care Team: Susy Frizzle, MD as PCP - General (Family Medicine) Satira Sark, MD as PCP - Cardiology (Cardiology)  Indicate any recent Medical Services you may have received from other than [facility]  providers in the past year (date may be approximate).     Assessment:   This is a routine wellness examination for [place].  Hearing/Vision screen No results  found.  Dietary issues and exercise activities discussed: Exercise limited by: orthopedic condition(s)   Goals Addressed             This Visit's Progress    DIET - EAT MORE FRUITS AND VEGETABLES         Depression Screen    01/04/2022    3:34 PM 11/26/2021   11:32 AM 11/18/2021    9:10 AM 12/18/2020    9:08 AM 11/24/2020    8:15 AM 04/23/2019   10:27 AM 12/02/2017    8:28 AM  PHQ 2/9 Scores  PHQ - 2 Score 1 0 '1 2 3 3 4  '$ PHQ- 9 Score '6  3 5 11 14 6    '$ Fall Risk    11/18/2021    9:11 AM 12/18/2020    9:18 AM 11/24/2020    8:15 AM 12/02/2017    8:28 AM 06/17/2016    2:06 PM  Fall Risk   Falls in the past year? 0 0 0 No No  Number falls in past yr: 0 0 0    Injury with Fall? 0 0 0    Risk for fall due to :  Impaired vision No Fall Risks    Follow up  Falls prevention discussed Falls evaluation completed      FALL RISK PREVENTION PERTAINING TO THE HOME:  Any stairs in or around the home? Yes  If so, are there any without handrails? No  Home free of loose throw rugs in walkways, pet beds, electrical cords, etc? Yes  Adequate lighting in your home to reduce risk of falls? Yes   ASSISTIVE DEVICES UTILIZED TO PREVENT FALLS:  Life alert? No  Use of a cane, walker or w/c? No  Grab bars in the bathroom? No  Shower chair or bench in shower? No  Elevated toilet seat or a handicapped toilet? No   TIMED UP AND GO:  Was the test performed? Yes .  Length of time to ambulate 10 feet: 2 sec.   Gait steady and fast without use of assistive device  Cognitive Function:        01/04/2022    3:43 PM 12/18/2020    9:23 AM  6CIT Screen  What Year? 0 points 0 points  What month? 0 points 0 points  What time?  0 points  Count back from 20 0 points 0 points  Months in reverse 0 points 0 points  Repeat phrase 2 points 0 points  Total Score  0 points    Immunizations Immunization History  Administered Date(s) Administered   Fluad Quad(high Dose 65+) 01/08/2021   Hep A / Hep  B 01/08/2019, 02/16/2019, 07/09/2019   Influenza,inj,Quad PF,6+ Mos 03/13/2013, 11/26/2013, 01/12/2017, 12/21/2018   Influenza-Unspecified 12/13/2017   PFIZER(Purple Top)SARS-COV-2 Vaccination 04/04/2019, 04/25/2019   Tdap 10/09/2019    TDAP status: Up to date  Flu Vaccine status: Completed at today's visit  Pneumococcal vaccine status: Due, Education has been provided regarding the importance of this vaccine. Advised may receive this vaccine at local pharmacy or Health Dept. Aware to provide a copy of the vaccination record if obtained from local pharmacy or Health Dept. Verbalized acceptance and understanding.  Covid-19 vaccine status: Declined, Education has been provided regarding the importance of this vaccine but patient still declined. Advised may receive this vaccine  at local pharmacy or Health Dept.or vaccine clinic. Aware to provide a copy of the vaccination record if obtained from local pharmacy or Health Dept. Verbalized acceptance and understanding.  Qualifies for Shingles Vaccine? No   Zostavax completed No   Shingrix Completed?: No.    Education has been provided regarding the importance of this vaccine. Patient has been advised to call insurance company to determine out of pocket expense if they have not yet received this vaccine. Advised may also receive vaccine at local pharmacy or Health Dept. Verbalized acceptance and understanding.  Screening Tests Health Maintenance  Topic Date Due   COVID-19 Vaccine (3 - Pfizer risk series) 01/20/2022 (Originally 05/23/2019)   INFLUENZA VACCINE  06/13/2022 (Originally 10/13/2021)   Pneumonia Vaccine 84+ Years old (1 - PCV) 07/06/2022 (Originally 01/31/2019)   Zoster Vaccines- Shingrix (1 of 2) 07/06/2022 (Originally 01/30/1975)   COLONOSCOPY (Pts 45-48yr Insurance coverage will need to be confirmed)  03/03/2022   MAMMOGRAM  03/25/2023   TETANUS/TDAP  10/08/2029   DEXA SCAN  Completed   Hepatitis C Screening  Completed   HPV  VACCINES  Aged Out    Health Maintenance  There are no preventive care reminders to display for this patient.   Colorectal cancer screening: Type of screening: Colonoscopy. Completed 02/2017. Repeat every 5 years  Mammogram status: Completed 10/23. Repeat every year  Bone Density status: Completed 01/2020. Results reflect: Bone density results: NORMAL. Repeat every 1 years.  Lung Cancer Screening: (Low Dose CT Chest recommended if Age 68-80years, 30 pack-year currently smoking OR have quit w/in 15years.) does not qualify.   Lung Cancer Screening Referral: n/a  Additional Screening:  Hepatitis C Screening: does not qualify; Completed n/a  Vision Screening: Recommended annual ophthalmology exams for early detection of glaucoma and other disorders of the eye. Is the patient up to date with their annual eye exam?  Yes  Who is the provider or what is the name of the office in which the patient attends annual eye exams? [facility] GMadelineIf pt is not established with a provider, would they like to be referred to a provider to establish care? No .   Dental Screening: Recommended annual dental exams for proper oral hygiene  Community Resource Referral / Chronic Care Management: CRR required this visit?  No   CCM required this visit?  No      Plan:     I have personally reviewed and noted the following in the patient's chart:   Medical and social history Use of alcohol, tobacco or illicit drugs  Current medications and supplements including opioid prescriptions. Patient is currently taking opioid prescriptions. Information provided to patient regarding non-opioid alternatives. Patient advised to discuss non-opioid treatment plan with their provider. Functional ability and status Nutritional status Physical activity Advanced directives List of other physicians Hospitalizations, surgeries, and ER visits in previous 12 months Vitals Screenings to include  cognitive, depression, and falls Referrals and appointments  In addition, I have reviewed and discussed with patient certain preventive protocols, quality metrics, and best practice recommendations. A written personalized care plan for preventive services as well as general preventive health recommendations were provided to patient.     BColman Cater [place]  01/04/2022   Nurse Notes: n/a

## 2022-02-09 ENCOUNTER — Other Ambulatory Visit: Payer: Self-pay | Admitting: Obstetrics

## 2022-02-09 DIAGNOSIS — Z1231 Encounter for screening mammogram for malignant neoplasm of breast: Secondary | ICD-10-CM

## 2022-02-10 ENCOUNTER — Encounter: Payer: Self-pay | Admitting: Gastroenterology

## 2022-03-08 DIAGNOSIS — U071 COVID-19: Secondary | ICD-10-CM | POA: Diagnosis not present

## 2022-03-09 ENCOUNTER — Telehealth: Payer: Self-pay

## 2022-03-09 NOTE — Telephone Encounter (Signed)
Pt called and states she was seen at the [facility] yesterday and tested + for Covid. Pt states she was given an prescription for Paxlovid. Pt states her son, who is a Tax adviser, advised Ivermectin instead. Pt states she started the Paxlovid yesterday and asks if she should do the Ivermectin. Thank you.

## 2022-03-11 ENCOUNTER — Other Ambulatory Visit: Payer: Self-pay | Admitting: Family Medicine

## 2022-03-11 MED ORDER — CIPROFLOXACIN HCL 500 MG PO TABS: 500.0000 mg | ORAL_TABLET | Freq: Two times a day (BID) | ORAL | 0 refills | Status: AC

## 2022-03-11 NOTE — Telephone Encounter (Signed)
Patient called back to report she's having sx of a UTI and believes it's due to [place]; experiencing a lot of discomfort.   Patient requesting call back at [phone].

## 2022-03-16 ENCOUNTER — Encounter: Payer: Self-pay | Admitting: Gastroenterology

## 2022-03-21 ENCOUNTER — Other Ambulatory Visit: Payer: Self-pay | Admitting: Family Medicine

## 2022-03-31 ENCOUNTER — Ambulatory Visit (AMBULATORY_SURGERY_CENTER): Payer: Medicare Other | Admitting: *Deleted

## 2022-03-31 VITALS — Ht 64.0 in | Wt 232.0 lb

## 2022-03-31 DIAGNOSIS — Z86010 Personal history of colonic polyps: Secondary | ICD-10-CM

## 2022-03-31 MED ORDER — NA SULFATE-K SULFATE-MG SULF 17.5-3.13-1.6 GM/177ML PO SOLN: 1.0000 | Freq: Once | ORAL | 0 refills | Status: AC

## 2022-03-31 NOTE — Progress Notes (Signed)
No egg or soy allergy known to patient  No issues known to pt with past sedation with any surgeries or procedures Patient denies ever being told they had issues or difficulty with intubation  No FH of Malignant Hyperthermia Pt is not on diet pills Pt is not on  home 02  Pt is not on blood thinners  Pt denies issues with constipation  Pt is not on dialysis Pt denies any upcoming cardiac testing Pt encouraged to use to use Singlecare or Goodrx to reduce cost  Patient's chart reviewed by Osvaldo Angst CNRA prior to previsit and patient appropriate for the [place].  Previsit completed and red dot placed by patient's name on their procedure day (on provider's schedule).  . Visit by phone After talking with pt she agreed that she should have an OV with MD before moving further procedure has not been canceled  and a time blocked for potiental Endo can be added after MD speaks with pt. RN reviewed instructions with pt on the phone. Copy of instructions sent to my chart and by mail with coupon

## 2022-04-06 ENCOUNTER — Ambulatory Visit
Admission: RE | Admit: 2022-04-06 | Discharge: 2022-04-06 | Disposition: A | Discharge status: Home / Self Care | Payer: Medicare Other | Source: Ambulatory Visit | Attending: Obstetrics | Admitting: Obstetrics

## 2022-04-06 DIAGNOSIS — Z1231 Encounter for screening mammogram for malignant neoplasm of breast: Secondary | ICD-10-CM

## 2022-04-14 ENCOUNTER — Encounter: Payer: Self-pay | Admitting: Gastroenterology

## 2022-04-22 ENCOUNTER — Encounter: Payer: Self-pay | Admitting: Gastroenterology

## 2022-04-22 ENCOUNTER — Ambulatory Visit: Payer: Medicare Other | Admitting: Gastroenterology

## 2022-04-22 ENCOUNTER — Other Ambulatory Visit (INDEPENDENT_AMBULATORY_CARE_PROVIDER_SITE_OTHER): Payer: Medicare Other

## 2022-04-22 VITALS — BP 124/80 | HR 71 | Ht 64.0 in | Wt 236.0 lb

## 2022-04-22 DIAGNOSIS — K7460 Unspecified cirrhosis of liver: Secondary | ICD-10-CM | POA: Diagnosis not present

## 2022-04-22 DIAGNOSIS — Z86010 Personal history of colonic polyps: Secondary | ICD-10-CM | POA: Diagnosis not present

## 2022-04-22 LAB — CBC WITH DIFFERENTIAL/PLATELET
Basophils Absolute: 0.1 10*3/uL (ref 0.0–0.1)
Basophils Relative: 1.1 % (ref 0.0–3.0)
Eosinophils Absolute: 0.2 10*3/uL (ref 0.0–0.7)
Eosinophils Relative: 3.2 % (ref 0.0–5.0)
HCT: 42.1 % (ref 36.0–46.0)
Hemoglobin: 14.3 g/dL (ref 12.0–15.0)
Lymphocytes Relative: 24.8 % (ref 12.0–46.0)
Lymphs Abs: 1.3 10*3/uL (ref 0.7–4.0)
MCHC: 33.9 g/dL (ref 30.0–36.0)
MCV: 94 fl (ref 78.0–100.0)
Monocytes Absolute: 0.4 10*3/uL (ref 0.1–1.0)
Monocytes Relative: 8.5 % (ref 3.0–12.0)
Neutro Abs: 3.2 10*3/uL (ref 1.4–7.7)
Neutrophils Relative %: 62.4 % (ref 43.0–77.0)
Platelets: 171 10*3/uL (ref 150.0–400.0)
RBC: 4.48 Mil/uL (ref 3.87–5.11)
RDW: 14.4 % (ref 11.5–15.5)
WBC: 5.2 10*3/uL (ref 4.0–10.5)

## 2022-04-22 LAB — COMPREHENSIVE METABOLIC PANEL
ALT: 40 U/L — ABNORMAL HIGH (ref 0–35)
AST: 44 U/L — ABNORMAL HIGH (ref 0–37)
Albumin: 4.5 g/dL (ref 3.5–5.2)
Alkaline Phosphatase: 90 U/L (ref 39–117)
BUN: 10 mg/dL (ref 6–23)
CO2: 25 mEq/L (ref 19–32)
Calcium: 9.6 mg/dL (ref 8.4–10.5)
Chloride: 102 mEq/L (ref 96–112)
Creatinine, Ser: 0.89 mg/dL (ref 0.40–1.20)
GFR: 66.71 mL/min (ref >60.00)
Glucose, Bld: 101 mg/dL — ABNORMAL HIGH (ref 70–99)
Potassium: 3.9 mEq/L (ref 3.5–5.1)
Sodium: 139 mEq/L (ref 135–145)
Total Bilirubin: 1 mg/dL (ref 0.2–1.2)
Total Protein: 7.8 g/dL (ref 6.0–8.3)

## 2022-04-22 LAB — PROTIME-INR
INR: 1.2 ratio — ABNORMAL HIGH (ref 0.8–1.0)
Prothrombin Time: 12.8 s (ref 9.6–13.1)

## 2022-04-22 MED ORDER — NA SULFATE-K SULFATE-MG SULF 17.5-3.13-1.6 GM/177ML PO SOLN: 1.0000 | Freq: Once | ORAL | 0 refills | Status: AC

## 2022-04-22 NOTE — Patient Instructions (Signed)
Your provider has requested that you go to the basement level for lab work before leaving today. Press "B" on the elevator. The lab is located at the first door on the left as you exit the elevator.  You have been scheduled for an abdominal ultrasound at [facility] (1st floor of hospital) on 04/28/22 at 8:00am. Please arrive 30 minutes prior to your appointment for registration. Make certain not to have anything to eat or drink 6 hours prior to your appointment. Should you need to reschedule your appointment, please contact radiology at [phone]. This test typically takes about 30 minutes to perform.  You have been scheduled for an endoscopy and colonoscopy. Please follow the written instructions given to you at your visit today. Please pick up your prep supplies at the pharmacy within the next 1-3 days. If you use inhalers (even only as needed), please bring them with you on the day of your procedure.  The [facility] providers would like to encourage you to use [facility] to communicate with providers for non-urgent requests or questions.  Due to long hold times on the telephone, sending your provider a message by [facility] may be a faster and more efficient way to get a response.  Please allow 48 business hours for a response.  Please remember that this is for non-urgent requests.   Due to recent changes in healthcare laws, you may see the results of your imaging and laboratory studies on MyChart before your provider has had a chance to review them.  We understand that in some cases there may be results that are confusing or concerning to you. Not all laboratory results come back in the same time frame and the provider may be waiting for multiple results in order to interpret others.  Please give [place] 48 hours in order for your provider to thoroughly review all the results before contacting the office for clarification of your results.   Thank you for choosing me and [facility].  Pricilla Riffle. Dagoberto Ligas., MD., [facility] Regal

## 2022-04-22 NOTE — Progress Notes (Addendum)
    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 The risks (including bleeding, perforation, infection, missed lesions, medication reactions and possible hospitalization or surgery if complications occur), benefits, and alternatives to colonoscopy with possible biopsy and possible polypectomy were discussed with the patient and they consent to proceed.  The risks (including bleeding, perforation, infection, missed lesions, medication reactions and possible hospitalization or surgery if complications occur), benefits, and alternatives to endoscopy with possible biopsy and possible dilation were discussed with the patient and they consent to proceed.   Continue pantoprazole 40 mg daily and follow antireflux measures REV in 1 year   HPI    This is a 67 year old female with cirrhosis and a history of colon polyps. Last office visit in Sept 2021.  Colonoscopy in 2018 with 1 TA, 1 SSP.  She has not had follow-up for her cirrhosis or routine [place] screening recently.  She feels well and her energy level is good.  She is considering retiring this summer.  She denies abdominal distention, edema. Denies weight loss, abdominal pain, constipation, diarrhea, change in stool caliber, melena, hematochezia, nausea, vomiting, dysphagia, reflux symptoms, chest pain.    Labs / Imaging       Latest Ref Rng & Units 06/08/2021    9:17 AM 10/06/2020   11:06 AM 07/24/2020    1:28 PM  Hepatic Function  Total Protein 6.1 - 8.1 g/dL 7.8  7.1  7.7   Albumin 3.5 - 5.2 g/dL   4.6   AST 10 - 35 U/L 39  31  46   ALT 6 - 29 U/L 34  25  39   Alk Phosphatase 39 - 117 U/L   101   Total Bilirubin 0.2 - 1.2 mg/dL 0.8  0.6  0.8         Latest Ref Rng & Units 06/08/2021    9:17 AM 11/13/2020    8:54 AM 10/06/2020   11:06 AM  CBC  WBC 3.8 - 10.8 Thousand/uL 5.8  4.5  5.5   Hemoglobin 11.7 - 15.5 g/dL 14.8  13.9  13.6   Hematocrit 35.0 - 45.0 % 44.8  41.4  41.1   Platelets 140 - 400 Thousand/uL 154  131  132     Current Medications, Allergies, Past Medical History, Past Surgical History, Family History and Social History were reviewed in [employer] record.   Physical Exam: General: Well developed, well nourished, no acute distress Head: Normocephalic and atraumatic Eyes: Sclerae anicteric, EOMI Ears: Normal auditory acuity Mouth: No deformities or lesions noted Lungs: Clear throughout to auscultation Heart: Regular rate and rhythm; No murmurs, rubs or bruits Abdomen: Soft, non tender and non distended. No masses, hepatosplenomegaly or hernias noted. Normal Bowel sounds Rectal: Deferred to colonoscopy Musculoskeletal: Symmetrical with no gross deformities  Pulses:  Normal pulses noted Extremities: No edema or deformities noted Neurological: Alert oriented x 4, grossly nonfocal Psychological:  Alert and cooperative. Normal mood and affect   Amran Malter T. Fuller Plan, MD 04/22/2022, 10:16 AM

## 2022-04-26 LAB — AFP TUMOR MARKER: AFP-Tumor Marker: 5.5 ng/mL

## 2022-04-27 ENCOUNTER — Telehealth: Payer: Self-pay | Admitting: Family Medicine

## 2022-04-27 NOTE — Telephone Encounter (Signed)
Patient called to report severe nasal congestion and headache; requesting call back. Wants to know what kind of flu/virus is going around and what to take for it.   Please advise at [phone].

## 2022-04-28 ENCOUNTER — Encounter: Payer: Self-pay | Admitting: Gastroenterology

## 2022-04-28 ENCOUNTER — Ambulatory Visit (HOSPITAL_COMMUNITY)
Admission: RE | Admit: 2022-04-28 | Discharge: 2022-04-28 | Disposition: A | Discharge status: Home / Self Care | Payer: Medicare Other | Source: Ambulatory Visit | Attending: Gastroenterology | Admitting: Gastroenterology

## 2022-04-28 DIAGNOSIS — K7460 Unspecified cirrhosis of liver: Secondary | ICD-10-CM | POA: Insufficient documentation

## 2022-04-28 DIAGNOSIS — K8020 Calculus of gallbladder without cholecystitis without obstruction: Secondary | ICD-10-CM | POA: Diagnosis not present

## 2022-05-06 ENCOUNTER — Ambulatory Visit (AMBULATORY_SURGERY_CENTER): Payer: Medicare Other | Admitting: Gastroenterology

## 2022-05-06 ENCOUNTER — Encounter: Payer: Self-pay | Admitting: Gastroenterology

## 2022-05-06 VITALS — BP 162/81 | HR 67 | Temp 97.3°F | Resp 11 | Ht 64.0 in | Wt 236.0 lb

## 2022-05-06 DIAGNOSIS — K317 Polyp of stomach and duodenum: Secondary | ICD-10-CM

## 2022-05-06 DIAGNOSIS — D128 Benign neoplasm of rectum: Secondary | ICD-10-CM | POA: Diagnosis not present

## 2022-05-06 DIAGNOSIS — K7460 Unspecified cirrhosis of liver: Secondary | ICD-10-CM

## 2022-05-06 DIAGNOSIS — D123 Benign neoplasm of transverse colon: Secondary | ICD-10-CM

## 2022-05-06 DIAGNOSIS — Z86010 Personal history of colonic polyps: Secondary | ICD-10-CM

## 2022-05-06 DIAGNOSIS — Z09 Encounter for follow-up examination after completed treatment for conditions other than malignant neoplasm: Secondary | ICD-10-CM | POA: Diagnosis not present

## 2022-05-06 DIAGNOSIS — K635 Polyp of colon: Secondary | ICD-10-CM | POA: Diagnosis not present

## 2022-05-06 DIAGNOSIS — K621 Rectal polyp: Secondary | ICD-10-CM | POA: Diagnosis not present

## 2022-05-06 DIAGNOSIS — K219 Gastro-esophageal reflux disease without esophagitis: Secondary | ICD-10-CM

## 2022-05-06 MED ORDER — SODIUM CHLORIDE 0.9 % IV SOLN: 500.0000 mL | Freq: Once | INTRAVENOUS | Status: DC

## 2022-05-06 NOTE — Op Note (Addendum)
[place] Patient Name: Amanda Hoover Procedure Date: 05/06/2022 9:18 AM MRN: [id number] Endoscopist: Ladene Artist , MD, [id number] Age: 67 Referring MD:  Date of Birth: Jan 19, 1956 Gender: Female Account #: [id number] Procedure:                Colonoscopy Indications:              Surveillance: Personal history of adenomatous and                            sessile serrated polyps on last colonoscopy 5 years                            ago Medicines:                Monitored Anesthesia Care Procedure:                Pre-Anesthesia Assessment:                           - Prior to the procedure, a History and Physical                            was performed, and patient medications and                            allergies were reviewed. The patient's tolerance of                            previous anesthesia was also reviewed. The risks                            and benefits of the procedure and the sedation                            options and risks were discussed with the patient.                            All questions were answered, and informed consent                            was obtained. Prior Anticoagulants: The patient has                            taken no anticoagulant or antiplatelet agents. ASA                            Grade Assessment: III - A patient with severe                            systemic disease. After reviewing the risks and                            benefits, the patient was deemed in satisfactory  condition to undergo the procedure.                           After obtaining informed consent, the colonoscope                            was passed under direct vision. Throughout the                            procedure, the patient's blood pressure, pulse, and                            oxygen saturations were monitored continuously. The                            CF [id number] was introduced through the  anus                            and advanced to the the cecum, identified by                            appendiceal orifice and ileocecal valve. The                            colonoscopy was performed without difficulty. The                            patient tolerated the procedure well. The quality                            of the bowel preparation was good. The ileocecal                            valve, appendiceal orifice, and rectum were                            photographed. Scope In: 9:35:17 AM Scope Out: 9:50:41 AM Scope Withdrawal Time: 0 hours 11 minutes 56 seconds  Total Procedure Duration: 0 hours 15 minutes 24 seconds  Findings:                 The perianal and digital rectal examinations were                            normal.                           Four sessile polyps were found in the rectum (1)                            and transverse colon (3). The polyps were 6 to 7 mm                            in size. These polyps were removed with a cold  snare. Resection and retrieval were complete.                           Internal hemorrhoids were found during                            retroflexion. The hemorrhoids were small and Grade                            I (internal hemorrhoids that do not prolapse).                           The exam was otherwise normal throughout the                            examined colon. Complications:            No immediate complications. Estimated Blood Loss:     Estimated blood loss was minimal. Impression:               - Four 6 to 7 mm polyps in the rectum and in the                            transverse colon, removed with a cold snare.                            Resected and retrieved.                           - Internal hemorrhoids. Recommendation:           - Patient has a contact number available for                            emergencies. The signs and symptoms of potential                             delayed complications were discussed with the                            patient. Return to normal activities tomorrow.                            Written discharge instructions were provided to the                            patient.                           - Resume previous diet.                           - Continue present medications.                           - Await pathology results.                           -  Repeat colonoscopy is recommended for                            surveillance. The colonoscopy date will be                            determined after pathology results from today's                            exam become available for review. Ladene Artist, MD 05/06/2022 10:05:44 AM This report has been signed electronically.

## 2022-05-06 NOTE — Progress Notes (Signed)
See 04/22/2022 H&P, no changes

## 2022-05-06 NOTE — Progress Notes (Signed)
Called to room to assist during endoscopic procedure.  Patient ID and intended procedure confirmed with present staff. Received instructions for my participation in the procedure from the performing physician.  

## 2022-05-06 NOTE — Op Note (Signed)
[place] Patient Name: Amanda Hoover Procedure Date: 05/06/2022 9:52 AM MRN: [id number] Endoscopist: Ladene Artist , MD, [id number] Age: 67 Referring MD:  Date of Birth: [birth year] Gender: Female Account #: [id number] Procedure:                Upper GI endoscopy Indications:              Screening procedure: Cirrhosis screening for varices Medicines:                Monitored Anesthesia Care Procedure:                Pre-Anesthesia Assessment:                           - Prior to the procedure, a History and Physical                            was performed, and patient medications and                            allergies were reviewed. The patient's tolerance of                            previous anesthesia was also reviewed. The risks                            and benefits of the procedure and the sedation                            options and risks were discussed with the patient.                            All questions were answered, and informed consent                            was obtained. Prior Anticoagulants: The patient has                            taken no anticoagulant or antiplatelet agents. ASA                            Grade Assessment: III - A patient with severe                            systemic disease. After reviewing the risks and                            benefits, the patient was deemed in satisfactory                            condition to undergo the procedure.                           After obtaining informed consent, the endoscope was  passed under direct vision. Throughout the                            procedure, the patient's blood pressure, pulse, and                            oxygen saturations were monitored continuously. The                            GIF [id number] was introduced through the                            mouth, and advanced to the second part of duodenum.                             The upper GI endoscopy was accomplished without                            difficulty. The patient tolerated the procedure                            well. Scope In: Scope Out: Findings:                 The Z-line was variable 1-2 cm and was found at the                            gastroesophageal junction. Biopsies were taken with                            a cold forceps for histology.                           The exam of the esophagus was otherwise normal.                           A small hiatal hernia was present.                           Multiple 3 to 8 mm pedunculated and sessile polyps                            with no bleeding and no stigmata of recent bleeding                            were found in the gastric fundus and in the gastric                            body. Biopsies were taken with a cold forceps for                            histology.  The exam of the stomach was otherwise normal.                           The duodenal bulb and second portion of the                            duodenum were normal. Complications:            No immediate complications. Estimated Blood Loss:     Estimated blood loss was minimal. Impression:               - Z-line variable, at the gastroesophageal                            junction. Biopsied.                           - Small hiatal hernia.                           - Multiple gastric polyps. Biopsied.                           - Normal duodenal bulb and second portion of the                            duodenum. Recommendation:           - Patient has a contact number available for                            emergencies. The signs and symptoms of potential                            delayed complications were discussed with the                            patient. Return to normal activities tomorrow.                            Written discharge instructions were provided to the                             patient.                           - Resume previous diet.                           - Follow antireflux measures long term.                           - Continue present medications.                           - Await pathology results. Ladene Artist, MD 05/06/2022 10:10:16 AM This report has been signed electronically.

## 2022-05-06 NOTE — Progress Notes (Signed)
Pt's states no medical or surgical changes since previsit or office visit. 

## 2022-05-06 NOTE — Patient Instructions (Signed)
Handouts on hemorrhoids, polyps, and hiatal hernia given to patient. Await pathology results. Resume previous diet and continue present medications - follow an antireflux regimen  Repeat colonoscopy for surveillance will be determined based off of pathology results   YOU HAD AN ENDOSCOPIC PROCEDURE TODAY AT [facility]:   Refer to the procedure report that was given to you for any specific questions about what was found during the examination.  If the procedure report does not answer your questions, please call your gastroenterologist to clarify.  If you requested that your care partner not be given the details of your procedure findings, then the procedure report has been included in a sealed envelope for you to review at your convenience later.  YOU SHOULD EXPECT: Some feelings of bloating in the abdomen. Passage of more gas than usual.  Walking can help get rid of the air that was put into your GI tract during the procedure and reduce the bloating. If you had a lower endoscopy (such as a colonoscopy or flexible sigmoidoscopy) you may notice spotting of blood in your stool or on the toilet paper. If you underwent a bowel prep for your procedure, you may not have a normal bowel movement for a few days.  Please Note:  You might notice some irritation and congestion in your nose or some drainage.  This is from the oxygen used during your procedure.  There is no need for concern and it should clear up in a day or so.  SYMPTOMS TO REPORT IMMEDIATELY:  Following lower endoscopy (colonoscopy or flexible sigmoidoscopy):  Excessive amounts of blood in the stool  Significant tenderness or worsening of abdominal pains  Swelling of the abdomen that is new, acute  Fever of 100F or higher  Following upper endoscopy (EGD)  Vomiting of blood or coffee ground material  New chest pain or pain under the shoulder blades  Painful or persistently difficult swallowing  New shortness of  breath  Fever of 100F or higher  Black, tarry-looking stools  For urgent or emergent issues, a gastroenterologist can be reached at any hour by calling [phone]. Do not use MyChart messaging for urgent concerns.    DIET:  We do recommend a small meal at first, but then you may proceed to your regular diet.  Drink plenty of fluids but you should avoid alcoholic beverages for 24 hours.  ACTIVITY:  You should plan to take it easy for the rest of today and you should NOT DRIVE or use heavy machinery until tomorrow (because of the sedation medicines used during the test).    FOLLOW UP: Our staff will call the number listed on your records the next business day following your procedure.  We will call around 7:15- 8:00 am to check on you and address any questions or concerns that you may have regarding the information given to you following your procedure. If we do not reach you, we will leave a message.     If any biopsies were taken you will be contacted by phone or by letter within the next 1-3 weeks.  Please call us at [phone] if you have not heard about the biopsies in 3 weeks.    SIGNATURES/CONFIDENTIALITY: You and/or your care partner have signed paperwork which will be entered into your electronic medical record.  These signatures attest to the fact that that the information above on your After Visit Summary has been reviewed and is understood.  Full responsibility of the confidentiality of  this discharge information lies with you and/or your care-partner.

## 2022-05-06 NOTE — Progress Notes (Signed)
Pt resting comfortably. VSS. Airway intact. SBAR complete to RN. All questions answered.   

## 2022-05-07 ENCOUNTER — Telehealth: Payer: Self-pay

## 2022-05-07 NOTE — Telephone Encounter (Signed)
  Follow up Call-     05/06/2022    8:41 AM  Call back number  Post procedure Call Back phone  # [phone]  Permission to leave phone message Yes    Attempted to leave a message but the mailbox was full

## 2022-05-11 ENCOUNTER — Ambulatory Visit (INDEPENDENT_AMBULATORY_CARE_PROVIDER_SITE_OTHER): Payer: Medicare Other | Admitting: Family Medicine

## 2022-05-11 ENCOUNTER — Encounter: Payer: Self-pay | Admitting: Family Medicine

## 2022-05-11 VITALS — BP 114/76 | HR 86 | Temp 98.2°F | Ht 64.0 in | Wt 236.0 lb

## 2022-05-11 DIAGNOSIS — F419 Anxiety disorder, unspecified: Secondary | ICD-10-CM | POA: Diagnosis not present

## 2022-05-11 DIAGNOSIS — E669 Obesity, unspecified: Secondary | ICD-10-CM | POA: Diagnosis not present

## 2022-05-11 DIAGNOSIS — J329 Chronic sinusitis, unspecified: Secondary | ICD-10-CM

## 2022-05-11 MED ORDER — PHENTERMINE HCL 37.5 MG PO CAPS: 37.5000 mg | ORAL_CAPSULE | ORAL | 2 refills | Status: DC

## 2022-05-11 MED ORDER — LEVOFLOXACIN 500 MG PO TABS: 500.0000 mg | ORAL_TABLET | Freq: Every day | ORAL | 0 refills | Status: AC

## 2022-05-11 MED ORDER — ALPRAZOLAM 0.5 MG PO TABS: 0.5000 mg | ORAL_TABLET | Freq: Two times a day (BID) | ORAL | 0 refills | Status: DC | PRN
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
## 2022-05-17 ENCOUNTER — Encounter: Payer: Self-pay | Admitting: Gastroenterology

## 2022-06-30 ENCOUNTER — Other Ambulatory Visit: Payer: Self-pay | Admitting: Family Medicine

## 2022-06-30 DIAGNOSIS — M62830 Muscle spasm of back: Secondary | ICD-10-CM | POA: Diagnosis not present

## 2022-06-30 DIAGNOSIS — M9902 Segmental and somatic dysfunction of thoracic region: Secondary | ICD-10-CM | POA: Diagnosis not present

## 2022-06-30 DIAGNOSIS — M9903 Segmental and somatic dysfunction of lumbar region: Secondary | ICD-10-CM | POA: Diagnosis not present

## 2022-06-30 DIAGNOSIS — M9905 Segmental and somatic dysfunction of pelvic region: Secondary | ICD-10-CM | POA: Diagnosis not present

## 2022-07-02 DIAGNOSIS — M62830 Muscle spasm of back: Secondary | ICD-10-CM | POA: Diagnosis not present

## 2022-07-02 DIAGNOSIS — M9903 Segmental and somatic dysfunction of lumbar region: Secondary | ICD-10-CM | POA: Diagnosis not present

## 2022-07-02 DIAGNOSIS — M9905 Segmental and somatic dysfunction of pelvic region: Secondary | ICD-10-CM | POA: Diagnosis not present

## 2022-07-02 DIAGNOSIS — M9902 Segmental and somatic dysfunction of thoracic region: Secondary | ICD-10-CM | POA: Diagnosis not present

## 2022-07-05 DIAGNOSIS — M9903 Segmental and somatic dysfunction of lumbar region: Secondary | ICD-10-CM | POA: Diagnosis not present

## 2022-07-05 DIAGNOSIS — M9905 Segmental and somatic dysfunction of pelvic region: Secondary | ICD-10-CM | POA: Diagnosis not present

## 2022-07-05 DIAGNOSIS — M62830 Muscle spasm of back: Secondary | ICD-10-CM | POA: Diagnosis not present

## 2022-07-05 DIAGNOSIS — M9902 Segmental and somatic dysfunction of thoracic region: Secondary | ICD-10-CM | POA: Diagnosis not present

## 2022-07-09 DIAGNOSIS — M62830 Muscle spasm of back: Secondary | ICD-10-CM | POA: Diagnosis not present

## 2022-07-09 DIAGNOSIS — M9902 Segmental and somatic dysfunction of thoracic region: Secondary | ICD-10-CM | POA: Diagnosis not present

## 2022-07-09 DIAGNOSIS — M9905 Segmental and somatic dysfunction of pelvic region: Secondary | ICD-10-CM | POA: Diagnosis not present

## 2022-07-09 DIAGNOSIS — M9903 Segmental and somatic dysfunction of lumbar region: Secondary | ICD-10-CM | POA: Diagnosis not present

## 2022-07-12 DIAGNOSIS — M9905 Segmental and somatic dysfunction of pelvic region: Secondary | ICD-10-CM | POA: Diagnosis not present

## 2022-07-12 DIAGNOSIS — M9903 Segmental and somatic dysfunction of lumbar region: Secondary | ICD-10-CM | POA: Diagnosis not present

## 2022-07-12 DIAGNOSIS — M62830 Muscle spasm of back: Secondary | ICD-10-CM | POA: Diagnosis not present

## 2022-07-12 DIAGNOSIS — M9902 Segmental and somatic dysfunction of thoracic region: Secondary | ICD-10-CM | POA: Diagnosis not present

## 2022-07-15 DIAGNOSIS — M62830 Muscle spasm of back: Secondary | ICD-10-CM | POA: Diagnosis not present

## 2022-07-15 DIAGNOSIS — M9903 Segmental and somatic dysfunction of lumbar region: Secondary | ICD-10-CM | POA: Diagnosis not present

## 2022-07-15 DIAGNOSIS — M9905 Segmental and somatic dysfunction of pelvic region: Secondary | ICD-10-CM | POA: Diagnosis not present

## 2022-07-15 DIAGNOSIS — M9902 Segmental and somatic dysfunction of thoracic region: Secondary | ICD-10-CM | POA: Diagnosis not present

## 2022-07-19 DIAGNOSIS — M9903 Segmental and somatic dysfunction of lumbar region: Secondary | ICD-10-CM | POA: Diagnosis not present

## 2022-07-19 DIAGNOSIS — M9902 Segmental and somatic dysfunction of thoracic region: Secondary | ICD-10-CM | POA: Diagnosis not present

## 2022-07-19 DIAGNOSIS — M9905 Segmental and somatic dysfunction of pelvic region: Secondary | ICD-10-CM | POA: Diagnosis not present

## 2022-07-19 DIAGNOSIS — M62830 Muscle spasm of back: Secondary | ICD-10-CM | POA: Diagnosis not present

## 2022-07-22 DIAGNOSIS — M9903 Segmental and somatic dysfunction of lumbar region: Secondary | ICD-10-CM | POA: Diagnosis not present

## 2022-07-22 DIAGNOSIS — M9905 Segmental and somatic dysfunction of pelvic region: Secondary | ICD-10-CM | POA: Diagnosis not present

## 2022-07-22 DIAGNOSIS — M62830 Muscle spasm of back: Secondary | ICD-10-CM | POA: Diagnosis not present

## 2022-07-22 DIAGNOSIS — M9902 Segmental and somatic dysfunction of thoracic region: Secondary | ICD-10-CM | POA: Diagnosis not present

## 2022-07-30 DIAGNOSIS — M9903 Segmental and somatic dysfunction of lumbar region: Secondary | ICD-10-CM | POA: Diagnosis not present

## 2022-07-30 DIAGNOSIS — M62830 Muscle spasm of back: Secondary | ICD-10-CM | POA: Diagnosis not present

## 2022-07-30 DIAGNOSIS — M9902 Segmental and somatic dysfunction of thoracic region: Secondary | ICD-10-CM | POA: Diagnosis not present

## 2022-07-30 DIAGNOSIS — M9905 Segmental and somatic dysfunction of pelvic region: Secondary | ICD-10-CM | POA: Diagnosis not present

## 2022-08-04 DIAGNOSIS — M9905 Segmental and somatic dysfunction of pelvic region: Secondary | ICD-10-CM | POA: Diagnosis not present

## 2022-08-04 DIAGNOSIS — M9902 Segmental and somatic dysfunction of thoracic region: Secondary | ICD-10-CM | POA: Diagnosis not present

## 2022-08-04 DIAGNOSIS — M9903 Segmental and somatic dysfunction of lumbar region: Secondary | ICD-10-CM | POA: Diagnosis not present

## 2022-08-04 DIAGNOSIS — M62830 Muscle spasm of back: Secondary | ICD-10-CM | POA: Diagnosis not present

## 2022-08-31 ENCOUNTER — Encounter: Payer: Self-pay | Admitting: *Deleted

## 2022-08-31 ENCOUNTER — Ambulatory Visit: Payer: Medicare Other | Admitting: *Deleted

## 2022-08-31 NOTE — Patient Instructions (Signed)
Visit Information  Thank you for taking time to visit with me today. Please don't hesitate to contact me if I can be of assistance to you.   Following are the goals we discussed today:   Goals Addressed             This Visit's Progress    COMPLETED: Assess Need for Social Work Involvement.   On track    Care Coordination Interventions:  Interventions Today    Flowsheet Row Most Recent Value  Chronic Disease   Chronic disease during today's visit Other  [Chronic Headaches, Morbid Obesity]  General Interventions   General Interventions Discussed/Reviewed General Interventions Discussed, Labs, Vaccines, Doctor Visits, Referral to Nurse, Health Screening, Annual Foot Exam, General Interventions Reviewed, Annual Eye Exam, Durable Medical Equipment (DME), [employer], Communication with, Level of Care  [Encouraged]  Labs Kidney Function  [Encouraged]  Vaccines COVID-19, Flu, Pneumonia, RSV, Shingles, Tetanus/Pertussis/Diphtheria  [Encouraged]  Doctor Visits Discussed/Reviewed Doctor Visits Discussed, Specialist, Doctor Visits Reviewed, Annual Wellness Visits, PCP  [Encouraged]  Health Screening Bone Density, Colonoscopy, Mammogram  [Encouraged]  Durable Medical Equipment (DME) Other  [Encouraged]  PCP/Specialist Visits Compliance with follow-up visit  [Encouraged]  Communication with PCP/Specialists  [Encouraged]  Level of Care Applications, Personal Care Services  [Encouraged]  Applications Personal Care Services, [place]  [Encouraged]  Exercise Interventions   Exercise Discussed/Reviewed Exercise Discussed, Assistive device use and maintanence, Exercise Reviewed, Physical Activity, Weight Managment  [Encouraged]  Physical Activity Discussed/Reviewed Physical Activity Discussed, Home Exercise Program (HEP), Physical Activity Reviewed, Types of exercise  [Encouraged]  Weight Management Weight loss  [Encouraged]  Education Interventions   Education Provided Provided Development worker, international aid, Provided Education  [Encouraged]  Provided Verbal Education On Mental Health/Coping with Illness, Nutrition, When to see the doctor, Foot Care, Eye Care, Labs, [employer], [employer], Medication, Exercise, Applications  [Encouraged]  Applications Personal Care Services, [place]  [Encouraged]  Mental Health Interventions   Mental Health Discussed/Reviewed Mental Health Discussed, Anxiety, Depression, Mental Health Reviewed, Grief and Loss, Substance Abuse, Coping Strategies, Crisis, Other, Suicide  [Domestic Violence]  Nutrition Interventions   Nutrition Discussed/Reviewed Nutrition Discussed, Adding fruits and vegetables, Increasing proteins, Decreasing fats, Nutrition Reviewed, Fluid intake, Decreasing salt, Portion sizes, Carbohydrate meal planning, Decreasing sugar intake  [Encouraged]  Pharmacy Interventions   Pharmacy Dicussed/Reviewed Pharmacy Topics Discussed, Pharmacy Topics Reviewed, Medication Adherence, Affording Medications  [Encouraged]  Safety Interventions   Safety Discussed/Reviewed Safety Discussed, Safety Reviewed  [Encouraged]  Advanced Directive Interventions   Advanced Directives Discussed/Reviewed Advanced Directives Discussed  [Encouraged]     Assessed Social Determinant of Health Barriers. Discussed Plans for Ongoing Care Management Follow Up. Provided Careers information officer Information for Care Management Team Members. Screened for Signs & Symptoms of Depression, Related to Chronic Disease State.  PHQ2 & PHQ9 Depression Screen Completed & Results Reviewed.  Suicidal Ideation & Homicidal Ideation Assessed - None Present.   Domestic Violence Assessed - None Present. Access to Weapons Assessed - None Present.   Active Listening & Reflection Utilized.  Verbalization of Feelings Encouraged.  Emotional Support Provided. Problem Solving Interventions Reviewed. Task-Centered Solutions Discussed.   Solution-Focused Strategies Employed. Reviewed  Prescription Medications & Discussed Importance of Compliance. Quality of Sleep Assessed & Sleep Hygiene Techniques Promoted. Encouraged Routine Follow-Up with Primary Care Provider, Dr. Lynnea Ferrier with [facility] [phone]). Encouraged Careers information officer with CSW (# [phone]), if You Have Questions, Need Assistance, or If Social Work Needs Are Identified in The Near Future.  Please call the care guide team at [phone] if you need to cancel or reschedule your appointment.   If you are experiencing a Mental Health or [place] or need someone to talk to, please call the Suicide and Crisis Lifeline: 988 call the [place] National Suicide Prevention Lifeline: 601 811 2434 or TTY: 262-775-7399 TTY 203-513-8447) to talk to a trained counselor call 1-800-273-TALK (toll free, 24 hour hotline) go to [facility] [address], [place] [phone]) call the [place]: 515-464-2465 call 911  Patient verbalizes understanding of instructions and care plan provided today and agrees to view in [place]. Active MyChart status and patient understanding of how to access instructions and care plan via MyChart confirmed with patient.     No further follow up required.  Nat Christen, BSW, MSW, LCSW  Licensed Education officer, environmental [facility]  Mailing [place] [address] [place], [place] [address] Physical Address-[address] [place], [place] [address] Toll Free Main # [phone] Fax # [phone] Cell # [phone] [email or web address]

## 2022-08-31 NOTE — Patient Outreach (Signed)
Care Coordination   Initial Visit Note   08/31/2022  Name: Amanda Hoover MRN: [id number] DOB: 17-Nov-1955  Amanda Hoover is a 67 y.o. year old female who sees Pickard, Priscille Heidelberg, MD for primary care. I spoke with Amanda Hoover by phone today.  What matters to the patients health and wellness today?  No interventions identified.  Patient denies need for social work involvement at this time.   Goals Addressed             This Visit's Progress    COMPLETED: Assess Need for Social Work Involvement.   On track    Care Coordination Interventions:  Interventions Today    Flowsheet Row Most Recent Value  Chronic Disease   Chronic disease during today's visit Other  [Chronic Headaches, Morbid Obesity]  General Interventions   General Interventions Discussed/Reviewed General Interventions Discussed, Labs, Vaccines, Doctor Visits, Referral to Nurse, Health Screening, Annual Foot Exam, General Interventions Reviewed, Annual Eye Exam, Durable Medical Equipment (DME), [employer], Communication with, Level of Care  [Encouraged]  Labs Kidney Function  [Encouraged]  Vaccines COVID-19, Flu, Pneumonia, RSV, Shingles, Tetanus/Pertussis/Diphtheria  [Encouraged]  Doctor Visits Discussed/Reviewed Doctor Visits Discussed, Specialist, Doctor Visits Reviewed, Annual Wellness Visits, PCP  [Encouraged]  Health Screening Bone Density, Colonoscopy, Mammogram  [Encouraged]  Durable Medical Equipment (DME) Other  [Encouraged]  PCP/Specialist Visits Compliance with follow-up visit  [Encouraged]  Communication with PCP/Specialists  [Encouraged]  Level of Care Applications, Personal Care Services  [Encouraged]  Applications Personal Care Services, [place]  [Encouraged]  Exercise Interventions   Exercise Discussed/Reviewed Exercise Discussed, Assistive device use and maintanence, Exercise Reviewed, Physical Activity, Weight Managment  [Encouraged]  Physical Activity Discussed/Reviewed Physical  Activity Discussed, Home Exercise Program (HEP), Physical Activity Reviewed, Types of exercise  [Encouraged]  Weight Management Weight loss  [Encouraged]  Education Interventions   Education Provided Provided Therapist, sports, Provided Education  [Encouraged]  Provided Verbal Education On Mental Health/Coping with Illness, Nutrition, When to see the doctor, Foot Care, Eye Care, Labs, [employer], [employer], Medication, Exercise, Applications  [Encouraged]  Applications Personal Care Services, [place]  [Encouraged]  Mental Health Interventions   Mental Health Discussed/Reviewed Mental Health Discussed, Anxiety, Depression, Mental Health Reviewed, Grief and Loss, Substance Abuse, Coping Strategies, Crisis, Other, Suicide  [Domestic Violence]  Nutrition Interventions   Nutrition Discussed/Reviewed Nutrition Discussed, Adding fruits and vegetables, Increasing proteins, Decreasing fats, Nutrition Reviewed, Fluid intake, Decreasing salt, Portion sizes, Carbohydrate meal planning, Decreasing sugar intake  [Encouraged]  Pharmacy Interventions   Pharmacy Dicussed/Reviewed Pharmacy Topics Discussed, Pharmacy Topics Reviewed, Medication Adherence, Affording Medications  [Encouraged]  Safety Interventions   Safety Discussed/Reviewed Safety Discussed, Safety Reviewed  [Encouraged]  Advanced Directive Interventions   Advanced Directives Discussed/Reviewed Advanced Directives Discussed  [Encouraged]     Assessed Social Determinant of Health Barriers. Discussed Plans for Ongoing Care Management Follow Up. Provided Careers information officer Information for Care Management Team Members. Screened for Signs & Symptoms of Depression, Related to Chronic Disease State.  PHQ2 & PHQ9 Depression Screen Completed & Results Reviewed.  Suicidal Ideation & Homicidal Ideation Assessed - None Present.   Domestic Violence Assessed - None Present. Access to Weapons Assessed - None Present.   Active Listening &  Reflection Utilized.  Verbalization of Feelings Encouraged.  Emotional Support Provided. Problem Solving Interventions Reviewed. Task-Centered Solutions Discussed.   Solution-Focused Strategies Employed. Reviewed Prescription Medications & Discussed Importance of Compliance. Quality of Sleep Assessed & Sleep Hygiene Techniques Promoted. Encouraged Routine Follow-Up with Primary Care Provider,  Dr. Lynnea Ferrier with [facility] [phone]). Encouraged Careers information officer with CSW (# [phone]), if You Have Questions, Need Assistance, or If Social Work Needs Are Identified in The Near Future.        SDOH assessments and interventions completed:  Yes.  SDOH Interventions Today    Flowsheet Row Most Recent Value  SDOH Interventions   Food Insecurity Interventions Intervention Not Indicated  Housing Interventions Intervention Not Indicated  Transportation Interventions Intervention Not Indicated, Patient Resources (Friends/Family)  Utilities Interventions Intervention Not Indicated  Alcohol Usage Interventions Intervention Not Indicated (Score <7)  Financial Strain Interventions Intervention Not Indicated  Physical Activity Interventions Patient Declined  Stress Interventions Intervention Not Indicated, Offered [employer], Provide Counseling  Social Connections Interventions Intervention Not Indicated     Care Coordination Interventions:  No, not indicated.   Follow up plan: No further intervention required.   Encounter Outcome:  Pt. Visit Completed.   Amanda Hoover, BSW, MSW, LCSW  Licensed Restaurant manager, [employer] food [facility]  Mailing [place] [address] [place], [place] [address] Physical Address[address] [place], [place] [address] Toll Free Main # [phone] Fax # [phone] Cell # [phone] [email or web address]

## 2022-09-07 DIAGNOSIS — H25813 Combined forms of age-related cataract, bilateral: Secondary | ICD-10-CM | POA: Diagnosis not present

## 2022-09-07 DIAGNOSIS — H04123 Dry eye syndrome of bilateral lacrimal glands: Secondary | ICD-10-CM | POA: Diagnosis not present

## 2022-09-07 DIAGNOSIS — H01004 Unspecified blepharitis left upper eyelid: Secondary | ICD-10-CM | POA: Diagnosis not present

## 2022-09-07 DIAGNOSIS — H01001 Unspecified blepharitis right upper eyelid: Secondary | ICD-10-CM | POA: Diagnosis not present

## 2022-09-07 DIAGNOSIS — H524 Presbyopia: Secondary | ICD-10-CM | POA: Diagnosis not present

## 2022-10-07 DIAGNOSIS — B880 Other acariasis: Secondary | ICD-10-CM | POA: Diagnosis not present

## 2022-10-07 DIAGNOSIS — H0011 Chalazion right upper eyelid: Secondary | ICD-10-CM | POA: Diagnosis not present

## 2022-10-07 DIAGNOSIS — H01001 Unspecified blepharitis right upper eyelid: Secondary | ICD-10-CM | POA: Diagnosis not present

## 2022-10-07 DIAGNOSIS — H01004 Unspecified blepharitis left upper eyelid: Secondary | ICD-10-CM | POA: Diagnosis not present

## 2022-10-15 ENCOUNTER — Other Ambulatory Visit: Payer: Medicare Other

## 2022-10-15 DIAGNOSIS — E7800 Pure hypercholesterolemia, unspecified: Secondary | ICD-10-CM | POA: Diagnosis not present

## 2022-10-15 DIAGNOSIS — D693 Immune thrombocytopenic purpura: Secondary | ICD-10-CM

## 2022-10-15 DIAGNOSIS — E038 Other specified hypothyroidism: Secondary | ICD-10-CM

## 2022-10-15 DIAGNOSIS — E559 Vitamin D deficiency, unspecified: Secondary | ICD-10-CM

## 2022-10-15 LAB — CBC WITH DIFFERENTIAL/PLATELET
Absolute Monocytes: 678 cells/uL (ref 200–950)
Basophils Absolute: 83 cells/uL (ref 0–200)
Basophils Relative: 1.3 %
Eosinophils Absolute: 352 cells/uL (ref 15–500)
Eosinophils Relative: 5.5 %
HCT: 43.1 % (ref 35.0–45.0)
Hemoglobin: 14.7 g/dL (ref 11.7–15.5)
Lymphs Abs: 1478 cells/uL (ref 850–3900)
MCH: 32 pg (ref 27.0–33.0)
MCHC: 34.1 g/dL (ref 32.0–36.0)
MCV: 93.7 fL (ref 80.0–100.0)
MPV: 11.6 fL (ref 7.5–12.5)
Monocytes Relative: 10.6 %
Neutro Abs: 3808 cells/uL (ref 1500–7800)
Neutrophils Relative %: 59.5 %
Platelets: 162 10*3/uL (ref 140–400)
RBC: 4.6 10*6/uL (ref 3.80–5.10)
RDW: 12.9 % (ref 11.0–15.0)
Total Lymphocyte: 23.1 %
WBC: 6.4 10*3/uL (ref 3.8–10.8)

## 2022-10-19 ENCOUNTER — Encounter: Payer: Self-pay | Admitting: Family Medicine

## 2022-10-19 ENCOUNTER — Ambulatory Visit (INDEPENDENT_AMBULATORY_CARE_PROVIDER_SITE_OTHER): Payer: Medicare Other | Admitting: Family Medicine

## 2022-10-19 VITALS — BP 130/76 | HR 101 | Temp 97.9°F | Ht 64.0 in | Wt 245.8 lb

## 2022-10-19 DIAGNOSIS — E669 Obesity, unspecified: Secondary | ICD-10-CM

## 2022-10-19 DIAGNOSIS — Z0001 Encounter for general adult medical examination with abnormal findings: Secondary | ICD-10-CM

## 2022-10-19 DIAGNOSIS — Z23 Encounter for immunization: Secondary | ICD-10-CM | POA: Diagnosis not present

## 2022-10-19 DIAGNOSIS — E038 Other specified hypothyroidism: Secondary | ICD-10-CM

## 2022-10-19 DIAGNOSIS — Z0000 Encounter for general adult medical examination without abnormal findings: Secondary | ICD-10-CM

## 2022-10-19 DIAGNOSIS — E7800 Pure hypercholesterolemia, unspecified: Secondary | ICD-10-CM

## 2022-10-19 DIAGNOSIS — M810 Age-related osteoporosis without current pathological fracture: Secondary | ICD-10-CM

## 2022-10-19 MED ORDER — NEOMYCIN-POLYMYXIN-HC 3.5-10000-1 OT SOLN: 3.0000 [drp] | Freq: Four times a day (QID) | OTIC | 0 refills | Status: DC

## 2022-10-19 MED ORDER — PANTOPRAZOLE SODIUM 40 MG PO TBEC: DELAYED_RELEASE_TABLET | ORAL | 3 refills | Status: DC

## 2022-10-19 MED ORDER — LEVOTHYROXINE SODIUM 100 MCG PO TABS: 100.0000 ug | ORAL_TABLET | Freq: Every day | ORAL | 3 refills | Status: DC

## 2022-10-19 NOTE — Progress Notes (Signed)
Subjective:    Patient ID: Amanda Hoover, female    DOB: [birth year], 67 y.o.   MRN: [id number]  HPI  Patient is a 67 year old Caucasian female here today for a physical exam.  Her last mammogram was in January and was normal.  Her last colonoscopy in 2022.  They found 1 tubular adenoma and recommended a repeat colonoscopy in 7 years.  She is over the age of 65 and therefore does not require Pap smear.  She does have a history of osteoporosis and she is overdue for a bone density test.  She is not currently taking any bisphosphonate due to concerns about osteonecrosis of the jaw.  She is due for Pneumovax 23.  She is due for Shingrix.  Her blood pressure today is excellent.  Her most recent lab work as listed below Lab on 10/15/2022  Component Date Value Ref Range Status   WBC 10/15/2022 6.4  3.8 - 10.8 Thousand/uL Final   RBC 10/15/2022 4.60  3.80 - 5.10 Million/uL Final   Hemoglobin 10/15/2022 14.7  11.7 - 15.5 g/dL Final   HCT 65/78/4696 43.1  35.0 - 45.0 % Final   MCV 10/15/2022 93.7  80.0 - 100.0 fL Final   MCH 10/15/2022 32.0  27.0 - 33.0 pg Final   MCHC 10/15/2022 34.1  32.0 - 36.0 g/dL Final   RDW 29/52/8413 12.9  11.0 - 15.0 % Final   Platelets 10/15/2022 162  140 - 400 Thousand/uL Final   MPV 10/15/2022 11.6  7.5 - 12.5 fL Final   Neutro Abs 10/15/2022 3,808  1,500 - 7,800 cells/uL Final   Lymphs Abs 10/15/2022 1,478  850 - 3,900 cells/uL Final   Absolute Monocytes 10/15/2022 678  200 - 950 cells/uL Final   Eosinophils Absolute 10/15/2022 352  15 - 500 cells/uL Final   Basophils Absolute 10/15/2022 83  0 - 200 cells/uL Final   Neutrophils Relative % 10/15/2022 59.5  % Final   Total Lymphocyte 10/15/2022 23.1  % Final   Monocytes Relative 10/15/2022 10.6  % Final   Eosinophils Relative 10/15/2022 5.5  % Final   Basophils Relative 10/15/2022 1.3  % Final   Glucose, Bld 10/15/2022 119 (H)  65 - 99 mg/dL Final   Comment: .            Fasting reference interval . For someone  without known diabetes, a glucose value between 100 and 125 mg/dL is consistent with prediabetes and should be confirmed with a follow-up test. .    BUN 10/15/2022 16  7 - 25 mg/dL Final   Creat 24/40/1027 0.97  0.50 - 1.05 mg/dL Final   eGFR 25/36/6440 64  > OR = 60 mL/min/1.15m2 Final   BUN/Creatinine Ratio 10/15/2022 SEE NOTE:  6 - 22 (calc) Final   Comment:    Not Reported: BUN and Creatinine are within    reference range. .    Sodium 10/15/2022 137  135 - 146 mmol/L Final   Potassium 10/15/2022 4.4  3.5 - 5.3 mmol/L Final   Chloride 10/15/2022 102  98 - 110 mmol/L Final   CO2 10/15/2022 24  20 - 32 mmol/L Final   Calcium 10/15/2022 10.3  8.6 - 10.4 mg/dL Final   Total Protein 34/74/2595 7.4  6.1 - 8.1 g/dL Final   Albumin 63/87/5643 4.5  3.6 - 5.1 g/dL Final   Globulin 32/95/1884 2.9  1.9 - 3.7 g/dL (calc) Final   AG Ratio 10/15/2022 1.6  1.0 - 2.5 (calc) Final  Total Bilirubin 10/15/2022 0.7  0.2 - 1.2 mg/dL Final   Alkaline phosphatase (APISO) 10/15/2022 94  37 - 153 U/L Final   AST 10/15/2022 58 (H)  10 - 35 U/L Final   ALT 10/15/2022 52 (H)  6 - 29 U/L Final   Cholesterol 10/15/2022 264 (H)  <200 mg/dL Final   HDL 60/45/4098 44 (L)  > OR = 50 mg/dL Final   Triglycerides 11/91/4782 231 (H)  <150 mg/dL Final   Comment: . If a non-fasting specimen was collected, consider repeat triglyceride testing on a fasting specimen if clinically indicated.  Perry Mount et al. J. of Clin. Lipidol. 2015;9:129-169. Marland Kitchen    LDL Cholesterol (Calc) 10/15/2022 177 (H)  mg/dL (calc) Final   Comment: Reference range: <100 . Desirable range <100 mg/dL for primary prevention;   <70 mg/dL for patients with CHD or diabetic patients  with > or = 2 CHD risk factors. Marland Kitchen LDL-C is now calculated using the Martin-Hopkins  calculation, which is a validated novel method providing  better accuracy than the Friedewald equation in the  estimation of LDL-C.  Horald Pollen et al. Lenox Ahr. 9562;130(86): 2061-2068   (http://education.[facility].com/faq/FAQ164)    Total CHOL/HDL Ratio 10/15/2022 6.0 (H)  <5.0 (calc) Final   Non-HDL Cholesterol (Calc) 10/15/2022 220 (H)  <130 mg/dL (calc) Final   Comment: Non-HDL level > or = 220 is very high and may indicate  genetic familial hypercholesterolemia (FH). Clinical  assessment and measurement of blood lipid levels  should be considered for all first-degree relatives  of patients with an FH diagnosis. . For patients with diabetes plus 1 major ASCVD risk  factor, treating to a non-HDL-C goal of <100 mg/dL  (LDL-C of <57 mg/dL) is considered a therapeutic  option.    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   Breast cancer, right (HCC)    ER 0 PR0 LN neg.   Chest pain    a. false positive NST in 10/2020 with cath in 11/2020 showing only minimal CAD with 10% mid-LAD stenosis.   Chronic headaches    Chronic ITP (idiopathic thrombocytopenia) (HCC)    mild thrombocytopenia, normal spleen on [place] 11/18, mild cirrhosis   Cirrhosis (HCC)    Dental crowns present    Depression    Fatty liver    GERD (gastroesophageal reflux disease)    Heart murmur    Hiatal hernia    History of colon polyps    History of esophageal stricture    Status post esophageal dilatation   History of radiation therapy 03/28/2012-05/17/2012   60.4 gray to right breast   Hyperlipidemia    Hypothyroid    Multiple environmental allergies    Multiple food allergies    Osteopenia    Osteoporosis    Palpitations    Personal history of radiation therapy 2013   Right Breast Cancer   Rash 01/06/2012   right elbow   Rhinitis, allergic    rhinitis   Past Surgical History:  Procedure Laterality Date   BREAST LUMPECTOMY Right 2013   COLONOSCOPY     COLONOSCOPY W/ POLYPECTOMY      ESOPHAGOGASTRODUODENOSCOPY     LEFT HEART CATH AND CORONARY ANGIOGRAPHY N/A 11/19/2020   Procedure: LEFT HEART CATH AND CORONARY ANGIOGRAPHY;  Surgeon: Tresa Endo,  Clovis Pu, MD;  Location: [facility];  Service: Cardiovascular;  Laterality: N/A;   MASTECTOMY Right 2013   partial mast   PARTIAL MASTECTOMY WITH NEEDLE LOCALIZATION AND AXILLARY SENTINEL LYMPH NODE BX  01/12/2012   Procedure: PARTIAL MASTECTOMY WITH NEEDLE LOCALIZATION AND AXILLARY SENTINEL LYMPH NODE BX;  Surgeon: Ernestene Mention, MD;  Location: [facility];  Service: General;  Laterality: Right;   RE-EXCISION OF BREAST CANCER,SUPERIOR MARGINS  02/15/2012   Procedure: RE-EXCISION OF BREAST CANCER,SUPERIOR MARGINS;  Surgeon: Ernestene Mention, MD;  Location: [facility];  Service: General;  Laterality: Right;  Re-excision of right lumpectomy, close posterior margin.   Current Outpatient Medications on File Prior to Visit  Medication Sig Dispense Refill   ALPRAZolam (XANAX) 0.5 MG tablet Take 1 tablet (0.5 mg total) by mouth 2 (two) times daily as needed for anxiety. 30 tablet 0   ascorbic acid (VITAMIN C) 500 MG tablet Take 500 mg by mouth daily.     Methylcobalamin (B12-ACTIVE PO) Take 1 tablet by mouth daily.     Multiple Vitamin (MULTIVITAMIN) tablet Take 1 tablet by mouth daily.     VITAMIN D PO Take 1 capsule by mouth daily.     zinc gluconate 50 MG tablet Take 50 mg by mouth daily.     phentermine 37.5 MG capsule Take 1 capsule (37.5 mg total) by mouth every morning. (Patient not taking: Reported on 10/19/2022) 30 capsule 2   No current facility-administered medications on file prior to visit.   Allergies  Allergen Reactions   Macadamia Nut Oil Anaphylaxis   Other Swelling    POPCORN:  SWELLING THROAT   Peanut-Containing Drug Products Swelling and Other (See Comments)    THROAT TIGHTNESS; ALSO WALNUTS   Pistachio Nut (Diagnostic) Anaphylaxis   Tomato Swelling    SWELLING UVULA   Adhesive  [Tape] Other (See Comments)    BLISTERS   Flour Other (See Comments)    SNEEZING   Penicillins Hives   Oxycodone     Unknown reaction   Sulfa Antibiotics Nausea Only   Vicodin [Hydrocodone-Acetaminophen] Hives and Swelling    Pt had flushing, then itching and swelling of lips   Soap Other (See Comments)    FRAGRANCES (SOAP/LOTION/PERFUME):  HEADACHE, RUNNY NOSE   Tramadol Nausea And Vomiting   Social History   Socioeconomic History   Marital status: Married    Spouse name: Tanija Kerst   Number of children: 4   Years of education: 12   Highest education level: 12th grade  Occupational History    Employer: [employer]    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[employer]  Tobacco Use   Smoking status: Former    Current packs/day: 0.00    Average packs/day: 0.5 packs/day for 8.0 years (4.0 ttl pk-yrs)    Types: Cigarettes    Start date: 03/15/1996    Quit date: 03/15/2004    Years since quitting: 18.6    Passive exposure: Past   Smokeless tobacco: Never  Vaping Use   Vaping status: Never Used  Substance and Sexual Activity   Alcohol use: No   Drug use: No    Comment: quit smoking 11 years ago   Sexual activity: Yes    Birth control/protection: Post-menopausal  Other Topics Concern   Not on file  Social History Narrative   1 daughter and 3 sons   5 grandchildren   Social Determinants of Health   Financial Resource Strain: Low Risk  (  08/31/2022)   Overall Financial Resource Strain (CARDIA)    Difficulty of Paying Living Expenses: Not hard at all  Food Insecurity: No Food Insecurity (08/31/2022)   Hunger Vital Sign    Worried About Running Out of Food in the Last Year: Never true    Ran Out of Food in the Last Year: Never true  Transportation Needs: No Transportation Needs (08/31/2022)   PRAPARE - Administrator, Civil Service (Medical): No    Lack of Transportation (Non-Medical): No  Physical Activity: Inactive (08/31/2022)   Exercise Vital Sign    Days of  Exercise per Week: 0 days    Minutes of Exercise per Session: 0 min  Stress: No Stress Concern Present (08/31/2022)   [employer] of Occupational Health - Occupational Stress Questionnaire    Feeling of Stress : Only a little  Social Connections: Socially Integrated (08/31/2022)   Social Connection and Isolation Panel [NHANES]    Frequency of Communication with Friends and Family: More than three times a week    Frequency of Social Gatherings with Friends and Family: More than three times a week    Attends Religious Services: More than 4 times per year    Active Member of [employer] or Organizations: Yes    Attends Banker Meetings: More than 4 times per year    Marital Status: Married  Catering manager Violence: Not At Risk (08/31/2022)   Humiliation, Afraid, Rape, and Kick questionnaire    Fear of Current or Ex-Partner: No    Emotionally Abused: No    Physically Abused: No    Sexually Abused: No      Review of Systems     Objective:   Physical Exam Vitals reviewed.  Constitutional:      General: She is not in acute distress.    Appearance: She is obese. She is not toxic-appearing or diaphoretic.  HENT:     Head: Normocephalic.     Right Ear: Tympanic membrane, ear canal and external ear normal.     Left Ear: Tympanic membrane, ear canal and external ear normal.     Nose: No mucosal edema, congestion or rhinorrhea.     Right Sinus: No maxillary sinus tenderness or frontal sinus tenderness.     Left Sinus: No maxillary sinus tenderness or frontal sinus tenderness.     Mouth/Throat:     Mouth: Mucous membranes are moist.     Pharynx: Oropharynx is clear. No oropharyngeal exudate.  Eyes:     Extraocular Movements: Extraocular movements intact.     Conjunctiva/sclera: Conjunctivae normal.     Pupils: Pupils are equal, round, and reactive to light.  Neck:     Thyroid: No thyroid mass, thyromegaly or thyroid tenderness.  Cardiovascular:     Rate and Rhythm:  Normal rate and regular rhythm.     Heart sounds: Normal heart sounds. No murmur heard.    No friction rub. No gallop.  Pulmonary:     Effort: Pulmonary effort is normal. No respiratory distress.     Breath sounds: Normal breath sounds. No stridor. No wheezing or rales.  Abdominal:     General: Abdomen is flat. Bowel sounds are normal. There is no distension.     Palpations: Abdomen is soft.     Tenderness: There is no abdominal tenderness. There is no guarding or rebound.  Musculoskeletal:     Cervical back: Full passive range of motion without pain, normal range of motion and neck supple. No edema  or erythema. No pain with movement or muscular tenderness. Normal range of motion.     Right lower leg: Edema present.     Left lower leg: Edema present.  Lymphadenopathy:     Cervical: No cervical adenopathy.  Skin:    Findings: No rash.  Neurological:     General: No focal deficit present.     Mental Status: She is alert and oriented to person, place, and time. Mental status is at baseline.     Cranial Nerves: No cranial nerve deficit.     Motor: No weakness.     Coordination: Coordination normal.     Gait: Gait normal.  Psychiatric:        Mood and Affect: Mood normal.        Behavior: Behavior normal.        Thought Content: Thought content normal.        Judgment: Judgment normal.           Assessment & Plan:  Age related osteoporosis, unspecified pathological fracture presence - Plan: DG Bone Density  Need for 23-polyvalent pneumococcal polysaccharide vaccine - Plan: Pneumococcal polysaccharide vaccine 23-valent greater than or equal to 2yo subcutaneous/IM  Encounter for Medicare annual wellness exam  Obesity, unspecified classification, unspecified obesity type, unspecified whether serious comorbidity present  Pure hypercholesterolemia  Other specified hypothyroidism I am very concerned about the patient's weight.  Her blood sugars in the prediabetic range of 119  and she has elevated liver function test leading me to believe that she has fatty liver disease.  I have strongly recommended diet exercise and weight loss.  Specifically I asked the patient to start trying to walk 10 to 15 minutes every day and gradually build up her exercise tolerance.  I want her to decrease her consumption of processed foods.  I want her to eat more fruits and vegetables and fish and chicken.  I want her to drink more water and stay away from sodas.  I would like to recheck blood work in 3 months and if her cholesterol is not improved at that point I would recommend statin.  Patient received Pneumovax 23 today.  Recommend raise but she wants to defer that.  Recommended we repeat a bone density test and if her bone density test has continued to deteriorate, I would strongly urged the patient to consider a bisphosphonate or Prolia

## 2022-10-28 ENCOUNTER — Telehealth: Payer: Self-pay

## 2022-10-28 DIAGNOSIS — K7460 Unspecified cirrhosis of liver: Secondary | ICD-10-CM

## 2022-10-28 NOTE — Telephone Encounter (Signed)
The pt was called and she agrees to the [place]. Order has been entered and sent to the schedulers

## 2022-10-28 NOTE — Telephone Encounter (Signed)
-----   Message from Nurse Wilson Singer sent at 10/28/2022  8:09 AM EDT -----  ----- Message ----- From: Loretha Stapler, RN Sent: 10/28/2022  12:00 AM EDT To: Lucky Cowboy, RN  Repeat RUQ [place] in 6 months

## 2022-11-02 ENCOUNTER — Encounter: Payer: Self-pay | Admitting: Family Medicine

## 2022-11-02 ENCOUNTER — Ambulatory Visit (HOSPITAL_COMMUNITY)
Admission: RE | Admit: 2022-11-02 | Discharge: 2022-11-02 | Disposition: A | Discharge status: Home / Self Care | Payer: Medicare Other | Source: Ambulatory Visit | Attending: Gastroenterology | Admitting: Gastroenterology

## 2022-11-02 ENCOUNTER — Ambulatory Visit (INDEPENDENT_AMBULATORY_CARE_PROVIDER_SITE_OTHER): Payer: Medicare Other | Admitting: Family Medicine

## 2022-11-02 VITALS — BP 140/78 | HR 94 | Temp 98.6°F | Ht 64.0 in | Wt 245.0 lb

## 2022-11-02 DIAGNOSIS — H6091 Unspecified otitis externa, right ear: Secondary | ICD-10-CM | POA: Diagnosis not present

## 2022-11-02 DIAGNOSIS — K7460 Unspecified cirrhosis of liver: Secondary | ICD-10-CM | POA: Insufficient documentation

## 2022-11-02 DIAGNOSIS — H6121 Impacted cerumen, right ear: Secondary | ICD-10-CM | POA: Diagnosis not present

## 2022-11-02 DIAGNOSIS — K8020 Calculus of gallbladder without cholecystitis without obstruction: Secondary | ICD-10-CM | POA: Diagnosis not present

## 2022-11-02 NOTE — Assessment & Plan Note (Addendum)
Has completed treatment with Neomycin and Polymycin drops. She did have cerumen impacted on her TM, this was relieved with flushing and she experienced immediate relief of symptoms. So s/s of curent infection. Instructed to return to office if symptoms return.

## 2022-11-02 NOTE — Progress Notes (Signed)
Subjective:  HPI: Amanda Hoover is a 67 y.o. female presenting on 11/02/2022 for Follow-up (f/u on right ear - drops didn't help - J)   HPI Patient is in today for right ear feeling like she is in a tunnel for a few weeks with diminished hearing. He was treated by my partner with Neomycin and Polymycin 2 weeks ago. She has been using these drops twice daily and has seen worsening of symptoms. Denies fever, body aches, chills, pain, or drainage.   Review of Systems  All other systems reviewed and are negative.   Relevant past medical history reviewed and updated as indicated.   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   Breast cancer, right (HCC)    ER 0 PR0 LN neg.   Chest pain    a. false positive NST in 10/2020 with cath in 11/2020 showing only minimal CAD with 10% mid-LAD stenosis.   Chronic headaches    Chronic ITP (idiopathic thrombocytopenia) (HCC)    mild thrombocytopenia, normal spleen on [place] 11/18, mild cirrhosis   Cirrhosis (HCC)    Dental crowns present    Depression    Fatty liver    GERD (gastroesophageal reflux disease)    Heart murmur    Hiatal hernia    History of colon polyps    History of esophageal stricture    Status post esophageal dilatation   History of radiation therapy 03/28/2012-05/17/2012   60.4 gray to right breast   Hyperlipidemia    Hypothyroid    Multiple environmental allergies    Multiple food allergies    Osteopenia    Osteoporosis    Palpitations    Personal history of radiation therapy 2013   Right Breast Cancer   Rash 01/06/2012   right elbow   Rhinitis, allergic    rhinitis     Past Surgical History:  Procedure Laterality Date   BREAST LUMPECTOMY Right 2013   COLONOSCOPY     COLONOSCOPY W/ POLYPECTOMY     ESOPHAGOGASTRODUODENOSCOPY     LEFT HEART CATH AND CORONARY ANGIOGRAPHY N/A  11/19/2020   Procedure: LEFT HEART CATH AND CORONARY ANGIOGRAPHY;  Surgeon: Lennette Bihari, MD;  Location: [facility];  Service: Cardiovascular;  Laterality: N/A;   MASTECTOMY Right 2013   partial mast   PARTIAL MASTECTOMY WITH NEEDLE LOCALIZATION AND AXILLARY SENTINEL LYMPH NODE BX  01/12/2012   Procedure: PARTIAL MASTECTOMY WITH NEEDLE LOCALIZATION AND AXILLARY SENTINEL LYMPH NODE BX;  Surgeon: Ernestene Mention, MD;  Location: [facility];  Service: General;  Laterality: Right;   RE-EXCISION OF BREAST CANCER,SUPERIOR MARGINS  02/15/2012   Procedure: RE-EXCISION OF BREAST CANCER,SUPERIOR MARGINS;  Surgeon: Ernestene Mention, MD;  Location: [facility];  Service: General;  Laterality: Right;  Re-excision of right lumpectomy, close posterior margin.    Allergies and medications reviewed and updated.   Current Outpatient Medications:    ALPRAZolam (XANAX) 0.5 MG tablet, Take 1 tablet (0.5 mg total) by mouth 2 (two) times daily as needed for anxiety., Disp: 30 tablet, Rfl: 0   ascorbic acid (VITAMIN C) 500 MG tablet, Take 500 mg by mouth daily., Disp: , Rfl:    doxycycline (VIBRAMYCIN) 100 MG capsule, Take 100 mg by mouth 2 (two) times daily., Disp: , Rfl:    levothyroxine (SYNTHROID) 100 MCG  tablet, Take 1 tablet (100 mcg total) by mouth daily., Disp: 90 tablet, Rfl: 3   Methylcobalamin (B12-ACTIVE PO), Take 1 tablet by mouth daily., Disp: , Rfl:    Multiple Vitamin (MULTIVITAMIN) tablet, Take 1 tablet by mouth daily., Disp: , Rfl:    neomycin-polymyxin-hydrocortisone (CORTISPORIN) OTIC solution, Place 3 drops into the right ear 4 (four) times daily., Disp: 10 mL, Rfl: 0   pantoprazole (PROTONIX) 40 MG tablet, TAKE 1 TABLET(40 MG) BY MOUTH DAILY, Disp: 90 tablet, Rfl: 3   phentermine 37.5 MG capsule, Take 1 capsule (37.5 mg total) by mouth every morning., Disp: 30 capsule, Rfl: 2   VITAMIN D PO, Take 1 capsule by mouth daily., Disp: , Rfl:    zinc gluconate  50 MG tablet, Take 50 mg by mouth daily., Disp: , Rfl:   Allergies  Allergen Reactions   Macadamia Nut Oil Anaphylaxis   Other Swelling    POPCORN:  SWELLING THROAT   Peanut-Containing Drug Products Swelling and Other (See Comments)    THROAT TIGHTNESS; ALSO WALNUTS   Pistachio Nut (Diagnostic) Anaphylaxis   Tomato Swelling    SWELLING UVULA   Adhesive [Tape] Other (See Comments)    BLISTERS   Flour Other (See Comments)    SNEEZING   Penicillins Hives   Azithromycin Swelling    Per pt lips swells up   Oxycodone     Unknown reaction   Sulfa Antibiotics Nausea Only   Vicodin [Hydrocodone-Acetaminophen] Hives and Swelling    Pt had flushing, then itching and swelling of lips   Soap Other (See Comments)    FRAGRANCES (SOAP/LOTION/PERFUME):  HEADACHE, RUNNY NOSE   Tramadol Nausea And Vomiting    Objective:   BP (!) 140/78   Pulse 94   Temp 98.6 F (37 C) (Oral)   Ht 5\' 4"  (1.626 m)   Wt 245 lb (111.1 kg)   SpO2 95%   BMI 42.05 kg/m      11/02/2022    2:16 PM 10/19/2022    2:33 PM 05/11/2022    2:05 PM  Vitals with BMI  Height 5\' 4"  5\' 4"  5\' 4"   Weight 245 lbs 245 lbs 13 oz 236 lbs  BMI 42.03 42.17 40.49  Systolic 140 130 952  Diastolic 78 76 76  Pulse 94 101 86     Physical Exam Vitals and nursing note reviewed.  Constitutional:      Appearance: Normal appearance. She is normal weight.  HENT:     Head: Normocephalic and atraumatic.     Right Ear: Hearing, tympanic membrane, ear canal and external ear normal. There is impacted cerumen.     Left Ear: Tympanic membrane, ear canal and external ear normal.  Skin:    General: Skin is warm and dry.  Neurological:     General: No focal deficit present.     Mental Status: She is alert and oriented to person, place, and time. Mental status is at baseline.  Psychiatric:        Mood and Affect: Mood normal.        Behavior: Behavior normal.        Thought Content: Thought content normal.        Judgment: Judgment  normal.     Assessment & Plan:  Otitis externa of right ear, unspecified chronicity, unspecified type Assessment & Plan: Has completed treatment with Neomycin and Polymycin drops. She did have cerumen impacted on her TM, this was relieved with flushing and she experienced immediate relief  of symptoms. So s/s of curent infection. Instructed to return to office if symptoms return.      Follow up plan: Return if symptoms worsen or fail to improve.  Park Meo, FNP

## 2022-12-28 ENCOUNTER — Ambulatory Visit: Payer: Medicare Other

## 2022-12-28 DIAGNOSIS — Z23 Encounter for immunization: Secondary | ICD-10-CM | POA: Diagnosis not present

## 2022-12-28 NOTE — Progress Notes (Signed)
Pt here for flu vaccine. Given in R deltoid as ordered. Pt tolerated well. Mjp,lpn

## 2023-01-06 ENCOUNTER — Ambulatory Visit (INDEPENDENT_AMBULATORY_CARE_PROVIDER_SITE_OTHER): Payer: Medicare Other

## 2023-01-06 VITALS — Ht 64.0 in | Wt 245.0 lb

## 2023-01-06 DIAGNOSIS — Z0000 Encounter for general adult medical examination without abnormal findings: Secondary | ICD-10-CM | POA: Diagnosis not present

## 2023-01-06 NOTE — Progress Notes (Signed)
Subjective:   Amanda Hoover is a 67 y.o. female who presents for Medicare Annual (Subsequent) preventive examination.  Visit Complete: Virtual I connected with  [name] on 01/06/23 by a audio enabled telemedicine application and verified that I am speaking with the correct person using two identifiers.  Patient Location: Home  Provider Location: Home Office  I discussed the limitations of evaluation and management by telemedicine. The patient expressed understanding and agreed to proceed.  Vital Signs: Because this visit was a virtual/telehealth visit, some criteria may be missing or patient reported. Any vitals not documented were not able to be obtained and vitals that have been documented are patient reported.  Cardiac Risk Factors include: advanced age (>52men, >8 women);dyslipidemia     Objective:    Today's Vitals   01/06/23 0939  Weight: 245 lb (111.1 kg)  Height: 5\' 4"  (1.626 m)   Body mass index is 42.05 kg/m.     01/06/2023    9:38 AM 08/31/2022    7:48 PM 01/04/2022    3:37 PM 12/18/2020    9:17 AM 11/19/2020    6:52 AM 09/28/2020    2:08 PM 03/02/2019    6:35 PM  Advanced Directives  Does Patient Have a Medical Advance Directive? No No Yes No No No No  Would patient like information on creating a medical advance directive? Yes (MAU/Ambulatory/Procedural Areas - Information given) No - Patient declined  No - Patient declined No - Patient declined No - Patient declined     Current Medications (verified) Outpatient Encounter Medications as of 01/06/2023  Medication Sig   ALPRAZolam (XANAX) 0.5 MG tablet Take 1 tablet (0.5 mg total) by mouth 2 (two) times daily as needed for anxiety.   ascorbic acid (VITAMIN C) 500 MG tablet Take 500 mg by mouth daily.   levothyroxine (SYNTHROID) 100 MCG tablet Take 1 tablet (100 mcg total) by mouth daily.   Methylcobalamin (B12-ACTIVE PO) Take 1 tablet by mouth daily.   Multiple Vitamin (MULTIVITAMIN) tablet Take 1  tablet by mouth daily.   neomycin-polymyxin-hydrocortisone (CORTISPORIN) OTIC solution Place 3 drops into the right ear 4 (four) times daily.   pantoprazole (PROTONIX) 40 MG tablet TAKE 1 TABLET(40 MG) BY MOUTH DAILY   phentermine 37.5 MG capsule Take 1 capsule (37.5 mg total) by mouth every morning.   VITAMIN D PO Take 1 capsule by mouth daily.   zinc gluconate 50 MG tablet Take 50 mg by mouth daily.   [DISCONTINUED] doxycycline (VIBRAMYCIN) 100 MG capsule Take 100 mg by mouth 2 (two) times daily.   No facility-administered encounter medications on file as of 01/06/2023.    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   Breast cancer, right (HCC)    ER 0 PR0 LN neg.   Chest pain    a. false positive NST in 10/2020 with cath in 11/2020 showing only minimal CAD with 10% mid-LAD stenosis.   Chronic headaches    Chronic ITP (idiopathic thrombocytopenia) (HCC)    mild thrombocytopenia, normal spleen on [place] 11/18, mild cirrhosis   Cirrhosis (HCC)    Dental crowns present    Depression    Fatty liver  GERD (gastroesophageal reflux disease)    Heart murmur    Hiatal hernia    History of colon polyps    History of esophageal stricture    Status post esophageal dilatation   History of radiation therapy 03/28/2012-05/17/2012   60.4 gray to right breast   Hyperlipidemia    Hypothyroid    Multiple environmental allergies    Multiple food allergies    Osteopenia    Osteoporosis    Palpitations    Personal history of radiation therapy 2013   Right Breast Cancer   Rash 01/06/2012   right  elbow   Rhinitis, allergic    rhinitis   Past Surgical History:  Procedure Laterality Date   BREAST LUMPECTOMY Right 2013   COLONOSCOPY     COLONOSCOPY W/ POLYPECTOMY     ESOPHAGOGASTRODUODENOSCOPY     LEFT HEART CATH AND CORONARY ANGIOGRAPHY N/A 11/19/2020   Procedure: LEFT HEART CATH AND CORONARY ANGIOGRAPHY;  Surgeon: Lennette Bihari, MD;  Location: [facility];  Service: Cardiovascular;  Laterality: N/A;   MASTECTOMY Right 2013   partial mast   PARTIAL MASTECTOMY WITH NEEDLE LOCALIZATION AND AXILLARY SENTINEL LYMPH NODE BX  01/12/2012   Procedure: PARTIAL MASTECTOMY WITH NEEDLE LOCALIZATION AND AXILLARY SENTINEL LYMPH NODE BX;  Surgeon: Ernestene Mention, MD;  Location: [facility];  Service: General;  Laterality: Right;   RE-EXCISION OF BREAST CANCER,SUPERIOR MARGINS  02/15/2012   Procedure: RE-EXCISION OF BREAST CANCER,SUPERIOR MARGINS;  Surgeon: Ernestene Mention, MD;  Location: [facility];  Service: General;  Laterality: Right;  Re-excision of right lumpectomy, close posterior margin.   Family History  Problem Relation Age of Onset   Crohn's disease Mother    Irritable bowel syndrome Mother    Hypertension Mother    Hyperlipidemia Mother    Stroke Mother    Thyroid disease Mother    Depression Mother    Esophageal cancer Father 71   Cancer Father    Cancer Maternal Uncle        unknown form of cancer   Breast cancer Paternal Aunt        dx in her early 67s   Diabetes Maternal Grandmother    Breast cancer Paternal Grandmother 66   Diabetes Paternal Grandmother    Cancer Paternal Grandmother    Melanoma Cousin        maternal cousin dx in her 4s   Breast cancer Other    Liver disease Other    Colon cancer Neg Hx    Rectal cancer Neg Hx    Stomach cancer Neg Hx    Colon polyps Neg Hx    Social History   Socioeconomic History   Marital status: Married    Spouse name: Jakaylah Loughry   Number of children: 4   Years of  education: 12   Highest education level: 12th grade  Occupational History    Employer: [employer]    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[employer]  Tobacco Use   Smoking status: Former    Current packs/day: 0.00    Average packs/day: 0.5 packs/day for 8.0 years (4.0 ttl pk-yrs)    Types: Cigarettes    Start date: 03/15/1996    Quit date: 03/15/2004    Years since quitting: 18.8    Passive exposure: Past   Smokeless tobacco: Never  Vaping Use   Vaping status: Never Used  Substance and Sexual Activity   Alcohol use: No   Drug use: No  Comment: quit smoking 11 years ago   Sexual activity: Yes    Birth control/protection: Post-menopausal  Other Topics Concern   Not on file  Social History Narrative   1 daughter and 3 sons   5 grandchildren   Social Determinants of Health   Financial Resource Strain: Low Risk  (01/06/2023)   Overall Financial Resource Strain (CARDIA)    Difficulty of Paying Living Expenses: Not hard at all  Food Insecurity: No Food Insecurity (01/06/2023)   Hunger Vital Sign    Worried About Running Out of Food in the Last Year: Never true    Ran Out of Food in the Last Year: Never true  Transportation Needs: No Transportation Needs (01/06/2023)   PRAPARE - Administrator, Civil Service (Medical): No    Lack of Transportation (Non-Medical): No  Physical Activity: Inactive (01/06/2023)   Exercise Vital Sign    Days of Exercise per Week: 0 days    Minutes of Exercise per Session: 0 min  Stress: No Stress Concern Present (01/06/2023)   [employer] of Occupational Health - Occupational Stress Questionnaire    Feeling of Stress : Not at all  Social Connections: Socially Integrated (01/06/2023)   Social Connection and Isolation Panel [NHANES]    Frequency of Communication with Friends and Family: More than three times a week    Frequency of Social Gatherings with Friends and Family: Three times a week    Attends Religious Services: More than 4  times per year    Active Member of Clubs or Organizations: Yes    Attends Engineer, structural: More than 4 times per year    Marital Status: Married    Tobacco Counseling Counseling given: Not Answered   Clinical Intake:  Pre-visit preparation completed: Yes  Pain : No/denies pain     Diabetes: No  How often do you need to have someone help you when you read instructions, pamphlets, or other written materials from your doctor or pharmacy?: 1 - Never  Interpreter Needed?: No  Information entered by :: Kandis Fantasia LPN   Activities of Daily Living    01/06/2023    9:37 AM 08/31/2022    7:49 PM  In your present state of health, do you have any difficulty performing the following activities:  Hearing? 0 0  Vision? 0 0  Difficulty concentrating or making decisions? 0 0  Walking or climbing stairs? 0 0  Dressing or bathing? 0 0  Doing errands, shopping? 0 0  Preparing Food and eating ? N N  Using the Toilet? N N  In the past six months, have you accidently leaked urine? N N  Do you have problems with loss of bowel control? N N  Managing your Medications? N N  Managing your Finances? N N  Housekeeping or managing your Housekeeping? N N    Patient Care Team: Donita Brooks, MD as PCP - General (Family Medicine) Jonelle Sidle, MD as PCP - Cardiology (Cardiology)  Indicate any recent Medical Services you may have received from other than [facility] providers in the past year (date may be approximate).     Assessment:   This is a routine wellness examination for Sims.  Hearing/Vision screen Hearing Screening - Comments:: Denies hearing difficulties   Vision Screening - Comments:: No vision problems; will schedule routine eye exam soon     Goals Addressed   None   Depression Screen    01/06/2023    9:44 AM 10/19/2022  2:36 PM 08/31/2022    7:46 PM 01/04/2022    3:34 PM 11/26/2021   11:32 AM 11/18/2021    9:10 AM 12/18/2020    9:08 AM  PHQ 2/9  Scores  PHQ - 2 Score 0 0 0 1 0 1 2  PHQ- 9 Score    6  3 5     Fall Risk    01/06/2023    9:37 AM 10/19/2022    2:36 PM 08/31/2022    7:49 PM 11/18/2021    9:11 AM 12/18/2020    9:18 AM  Fall Risk   Falls in the past year? 0 0 0 0 0  Number falls in past yr: 0 0 0 0 0  Injury with Fall? 0 0 0 0 0  Risk for fall due to : No Fall Risks No Fall Risks No Fall Risks  Impaired vision  Follow up Falls prevention discussed;Education provided;Falls evaluation completed Falls prevention discussed Falls evaluation completed;Education provided;Falls prevention discussed  Falls prevention discussed    MEDICARE RISK AT HOME: Medicare Risk at Home Any stairs in or around the home?: No If so, are there any without handrails?: No Home free of loose throw rugs in walkways, pet beds, electrical cords, etc?: Yes Adequate lighting in your home to reduce risk of falls?: Yes Life alert?: No Use of a cane, walker or w/c?: No Grab bars in the bathroom?: Yes Shower chair or bench in shower?: No Elevated toilet seat or a handicapped toilet?: Yes  TIMED UP AND GO:  Was the test performed?  No    Cognitive Function:        01/06/2023    9:38 AM 01/04/2022    3:43 PM 12/18/2020    9:23 AM  6CIT Screen  What Year? 0 points 0 points 0 points  What month? 0 points 0 points 0 points  What time? 0 points  0 points  Count back from 20 0 points 0 points 0 points  Months in reverse 0 points 0 points 0 points  Repeat phrase 0 points 2 points 0 points  Total Score 0 points  0 points    Immunizations Immunization History  Administered Date(s) Administered   Fluad Quad(high Dose 65+) 01/08/2021   Fluad Trivalent(High Dose 65+) 12/28/2022   Hep A / Hep B 01/08/2019, 02/16/2019, 07/09/2019   Influenza,inj,Quad PF,6+ Mos 03/13/2013, 11/26/2013, 01/12/2017, 12/21/2018, 01/04/2022   Influenza-Unspecified 12/13/2017   PFIZER(Purple Top)SARS-COV-2 Vaccination 04/04/2019, 04/25/2019   Pneumococcal  Polysaccharide-23 10/19/2022   Tdap 10/09/2019    TDAP status: Up to date  Flu Vaccine status: Up to date  Pneumococcal vaccine status: Up to date  Covid-19 vaccine status: Information provided on how to obtain vaccines.   Qualifies for Shingles Vaccine? Yes   Zostavax completed No   Shingrix Completed?: No.    Education has been provided regarding the importance of this vaccine. Patient has been advised to call insurance company to determine out of pocket expense if they have not yet received this vaccine. Advised may also receive vaccine at local pharmacy or Health Dept. Verbalized acceptance and understanding.  Screening Tests Health Maintenance  Topic Date Due   COVID-19 Vaccine (3 - Pfizer risk series) 04/21/2023 (Originally 05/23/2019)   Zoster Vaccines- Shingrix (1 of 2) 10/17/2023 (Originally 01/30/1975)   Pneumonia Vaccine 44+ Years old (2 of 2 - PCV) 10/19/2023   Medicare Annual Wellness (AWV)  01/06/2024   MAMMOGRAM  04/06/2024   Colonoscopy  05/06/2029   DTaP/Tdap/Td (2 - Td  or Tdap) 10/08/2029   INFLUENZA VACCINE  Completed   DEXA SCAN  Completed   Hepatitis C Screening  Completed   HPV VACCINES  Aged Out    Health Maintenance  There are no preventive care reminders to display for this patient.  Colorectal cancer screening: Type of screening: Colonoscopy. Completed 05/06/22. Repeat every 7 years  Mammogram status: Completed 04/06/22. Repeat every year  Bone Density status: Ordered and scheduled for 04/26/23. Pt provided with contact info and advised to call to schedule appt.  Lung Cancer Screening: (Low Dose CT Chest recommended if Age 49-80 years, 20 pack-year currently smoking OR have quit w/in 15years.) does not qualify.   Lung Cancer Screening Referral: n/a  Additional Screening:  Hepatitis C Screening: does qualify; Completed 01/01/19  Vision Screening: Recommended annual ophthalmology exams for early detection of glaucoma and other disorders of the  eye. Is the patient up to date with their annual eye exam?  Yes  Who is the provider or what is the name of the office in which the patient attends annual eye exams? Unable to provide name If pt is not established with a provider, would they like to be referred to a provider to establish care? No .   Dental Screening: Recommended annual dental exams for proper oral hygiene  Community Resource Referral / Chronic Care Management: CRR required this visit?  No   CCM required this visit?  No     Plan:     I have personally reviewed and noted the following in the patient's chart:   Medical and social history Use of alcohol, tobacco or illicit drugs  Current medications and supplements including opioid prescriptions. Patient is not currently taking opioid prescriptions. Functional ability and status Nutritional status Physical activity Advanced directives List of other physicians Hospitalizations, surgeries, and ER visits in previous 12 months Vitals Screenings to include cognitive, depression, and falls Referrals and appointments  In addition, I have reviewed and discussed with patient certain preventive protocols, quality metrics, and best practice recommendations. A written personalized care plan for preventive services as well as general preventive health recommendations were provided to patient.     Kandis Fantasia [place], [place]   [id number]   After Visit Summary: (MyChart) Due to this being a telephonic visit, the after visit summary with patients personalized plan was offered to patient via MyChart   Nurse Notes: No concerns at this time

## 2023-01-06 NOTE — Patient Instructions (Signed)
Amanda Hoover , Thank you for taking time to come for your Medicare Wellness Visit. I appreciate your ongoing commitment to your health goals. Please review the following plan we discussed and let me know if I can assist you in the future.   Referrals/Orders/Follow-Ups/Clinician Recommendations: Aim for 30 minutes of exercise or brisk walking, 6-8 glasses of water, and 5 servings of fruits and vegetables each day.  This is a list of the screening recommended for you and due dates:  Health Maintenance  Topic Date Due   COVID-19 Vaccine (3 - Pfizer risk series) 04/21/2023*   Zoster (Shingles) Vaccine (1 of 2) 10/17/2023*   Pneumonia Vaccine (2 of 2 - PCV) 10/19/2023   Medicare Annual Wellness Visit  01/06/2024   Mammogram  04/06/2024   Colon Cancer Screening  05/06/2029   DTaP/Tdap/Td vaccine (2 - Td or Tdap) 10/08/2029   Flu Shot  Completed   DEXA scan (bone density measurement)  Completed   Hepatitis C Screening  Completed   HPV Vaccine  Aged Out  *Topic was postponed. The date shown is not the original due date.    Advanced directives: (ACP Link)Information on Advanced Care Planning can be found at [facility] Advance Health Care Directives Advance Health Care Directives ([email or web address]   Next Medicare Annual Wellness Visit scheduled for next year: Yes

## 2023-01-20 ENCOUNTER — Ambulatory Visit: Payer: Medicare Other | Admitting: Family Medicine

## 2023-01-25 ENCOUNTER — Encounter: Payer: Self-pay | Admitting: Family Medicine

## 2023-01-25 ENCOUNTER — Ambulatory Visit (INDEPENDENT_AMBULATORY_CARE_PROVIDER_SITE_OTHER): Payer: Medicare Other | Admitting: Family Medicine

## 2023-01-25 VITALS — BP 128/82 | HR 88 | Temp 98.1°F | Ht 64.0 in | Wt 246.0 lb

## 2023-01-25 DIAGNOSIS — E038 Other specified hypothyroidism: Secondary | ICD-10-CM | POA: Diagnosis not present

## 2023-01-25 DIAGNOSIS — E7800 Pure hypercholesterolemia, unspecified: Secondary | ICD-10-CM

## 2023-01-25 NOTE — Progress Notes (Signed)
Subjective:    Patient ID: Amanda Hoover, female    DOB: 1955-09-04, 67 y.o.   MRN: [id number]  HPI   3 months ago the patients ldl was 177.  Glucose was 199 and TSH was > 7.  We increased her levothyroxine.  She has been working on diet and exercise to lower cholesterol.  She has a history of fatty liver disease and her liver function test last time were near 60. Wt Readings from Last 3 Encounters:  01/25/23 246 lb (111.6 kg)  01/06/23 245 lb (111.1 kg)  11/02/22 245 lb (111.1 kg)   unfortunately the patient has not been able to lose weight.  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   Breast cancer, right (HCC)    ER 0 PR0 LN neg.   Chest pain    a. false positive NST in 10/2020 with cath in 11/2020 showing only minimal CAD with 10% mid-LAD stenosis.   Chronic headaches    Chronic ITP (idiopathic thrombocytopenia) (HCC)    mild thrombocytopenia, normal spleen on [place] 11/18, mild cirrhosis   Cirrhosis (HCC)    Dental crowns present    Depression    Fatty liver    GERD (gastroesophageal reflux disease)    Heart murmur    Hiatal hernia    History of colon polyps    History of esophageal stricture    Status post esophageal dilatation   History of radiation therapy 03/28/2012-05/17/2012   60.4 gray to right breast   Hyperlipidemia    Hypothyroid    Multiple environmental allergies    Multiple food allergies    Osteopenia    Osteoporosis    Palpitations    Personal history of radiation therapy 2013   Right Breast Cancer   Rash 01/06/2012   right elbow   Rhinitis, allergic    rhinitis   Past Surgical History:  Procedure Laterality Date   BREAST LUMPECTOMY Right 2013   COLONOSCOPY     COLONOSCOPY W/ POLYPECTOMY     ESOPHAGOGASTRODUODENOSCOPY     LEFT HEART CATH AND CORONARY ANGIOGRAPHY N/A 11/19/2020   Procedure: LEFT HEART  CATH AND CORONARY ANGIOGRAPHY;  Surgeon: Lennette Bihari, MD;  Location: [facility];  Service: Cardiovascular;  Laterality: N/A;   MASTECTOMY Right 2013   partial mast   PARTIAL MASTECTOMY WITH NEEDLE LOCALIZATION AND AXILLARY SENTINEL LYMPH NODE BX  01/12/2012   Procedure: PARTIAL MASTECTOMY WITH NEEDLE LOCALIZATION AND AXILLARY SENTINEL LYMPH NODE BX;  Surgeon: Ernestene Mention, MD;  Location: [facility];  Service: General;  Laterality: Right;   RE-EXCISION OF BREAST CANCER,SUPERIOR MARGINS  02/15/2012   Procedure: RE-EXCISION OF BREAST CANCER,SUPERIOR MARGINS;  Surgeon: Ernestene Mention, MD;  Location: [facility];  Service: General;  Laterality: Right;  Re-excision of right lumpectomy, close posterior margin.   Current Outpatient Medications on File Prior to Visit  Medication Sig Dispense Refill   ALPRAZolam (XANAX) 0.5 MG tablet Take 1 tablet (0.5 mg total) by mouth 2 (two) times daily as needed for anxiety. 30 tablet 0   ascorbic acid (VITAMIN C) 500 MG tablet Take 500 mg by mouth daily.     levothyroxine (SYNTHROID) 100 MCG tablet Take 1 tablet (100 mcg total) by mouth daily. 90 tablet 3   Methylcobalamin (B12-ACTIVE PO) Take 1 tablet by  mouth daily.     Multiple Vitamin (MULTIVITAMIN) tablet Take 1 tablet by mouth daily.     neomycin-polymyxin-hydrocortisone (CORTISPORIN) OTIC solution Place 3 drops into the right ear 4 (four) times daily. 10 mL 0   pantoprazole (PROTONIX) 40 MG tablet TAKE 1 TABLET(40 MG) BY MOUTH DAILY 90 tablet 3   phentermine 37.5 MG capsule Take 1 capsule (37.5 mg total) by mouth every morning. 30 capsule 2   VITAMIN D PO Take 1 capsule by mouth daily.     zinc gluconate 50 MG tablet Take 50 mg by mouth daily.     No current facility-administered medications on file prior to visit.   Allergies  Allergen Reactions   Macadamia Nut Oil Anaphylaxis   Other Swelling    POPCORN:  SWELLING THROAT   Peanut-Containing Drug Products  Swelling and Other (See Comments)    THROAT TIGHTNESS; ALSO WALNUTS   Pistachio Nut (Diagnostic) Anaphylaxis   Tomato Swelling    SWELLING UVULA   Adhesive [Tape] Other (See Comments)    BLISTERS   Flour Other (See Comments)    SNEEZING   Penicillins Hives   Azithromycin Swelling    Per pt lips swells up   Oxycodone     Unknown reaction   Sulfa Antibiotics Nausea Only   Vicodin [Hydrocodone-Acetaminophen] Hives and Swelling    Pt had flushing, then itching and swelling of lips   Soap Other (See Comments)    FRAGRANCES (SOAP/LOTION/PERFUME):  HEADACHE, RUNNY NOSE   Tramadol Nausea And Vomiting   Social History   Socioeconomic History   Marital status: Married    Spouse name: Amyah Joswick   Number of children: 4   Years of education: 12   Highest education level: 12th grade  Occupational History    Employer: [employer]    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[employer]  Tobacco Use   Smoking status: Former    Current packs/day: 0.00    Average packs/day: 0.5 packs/day for 8.0 years (4.0 ttl pk-yrs)    Types: Cigarettes    Start date: 03/15/1996    Quit date: 03/15/2004    Years since quitting: 18.8    Passive exposure: Past   Smokeless tobacco: Never  Vaping Use   Vaping status: Never Used  Substance and Sexual Activity   Alcohol use: No   Drug use: No    Comment: quit smoking 11 years ago   Sexual activity: Yes    Birth control/protection: Post-menopausal  Other Topics Concern   Not on file  Social History Narrative   1 daughter and 3 sons   5 grandchildren   Social Determinants of Health   Financial Resource Strain: Low Risk  (01/06/2023)   Overall Financial Resource Strain (CARDIA)    Difficulty of Paying Living Expenses: Not hard at all  Food Insecurity: No Food Insecurity (01/06/2023)   Hunger Vital Sign    Worried About Running Out of Food in the Last Year: Never true    Ran Out of Food in the Last Year: Never true  Transportation Needs: No Transportation  Needs (01/06/2023)   PRAPARE - Administrator, Civil Service (Medical): No    Lack of Transportation (Non-Medical): No  Physical Activity: Inactive (01/06/2023)   Exercise Vital Sign    Days of Exercise per Week: 0 days    Minutes of Exercise per Session: 0 min  Stress: No Stress Concern Present (01/06/2023)   [employer] of Occupational Health - Occupational Stress Questionnaire  Feeling of Stress : Not at all  Social Connections: Socially Integrated (01/06/2023)   Social Connection and Isolation Panel [NHANES]    Frequency of Communication with Friends and Family: More than three times a week    Frequency of Social Gatherings with Friends and Family: Three times a week    Attends Religious Services: More than 4 times per year    Active Member of Clubs or Organizations: Yes    Attends Banker Meetings: More than 4 times per year    Marital Status: Married  Catering manager Violence: Not At Risk (01/06/2023)   Humiliation, Afraid, Rape, and Kick questionnaire    Fear of Current or Ex-Partner: No    Emotionally Abused: No    Physically Abused: No    Sexually Abused: No      Review of Systems     Objective:   Physical Exam Vitals reviewed.  Constitutional:      General: She is not in acute distress.    Appearance: She is obese. She is not toxic-appearing or diaphoretic.  HENT:     Head: Normocephalic.     Right Ear: Tympanic membrane, ear canal and external ear normal.     Left Ear: Tympanic membrane, ear canal and external ear normal.     Nose: No mucosal edema, congestion or rhinorrhea.     Right Sinus: No maxillary sinus tenderness or frontal sinus tenderness.     Left Sinus: No maxillary sinus tenderness or frontal sinus tenderness.     Mouth/Throat:     Mouth: Mucous membranes are moist.     Pharynx: Oropharynx is clear. No oropharyngeal exudate.  Eyes:     Extraocular Movements: Extraocular movements intact.     Conjunctiva/sclera:  Conjunctivae normal.     Pupils: Pupils are equal, round, and reactive to light.  Neck:     Thyroid: No thyroid mass, thyromegaly or thyroid tenderness.  Cardiovascular:     Rate and Rhythm: Normal rate and regular rhythm.     Heart sounds: Normal heart sounds. No murmur heard.    No friction rub. No gallop.  Pulmonary:     Effort: Pulmonary effort is normal. No respiratory distress.     Breath sounds: Normal breath sounds. No stridor. No wheezing or rales.  Abdominal:     General: Abdomen is flat. Bowel sounds are normal. There is no distension.     Palpations: Abdomen is soft.     Tenderness: There is no abdominal tenderness. There is no guarding or rebound.  Musculoskeletal:     Cervical back: Full passive range of motion without pain, normal range of motion and neck supple. No edema or erythema. No pain with movement or muscular tenderness. Normal range of motion.     Right lower leg: Edema present.     Left lower leg: Edema present.  Lymphadenopathy:     Cervical: No cervical adenopathy.  Skin:    Findings: No rash.  Neurological:     General: No focal deficit present.     Mental Status: She is alert and oriented to person, place, and time. Mental status is at baseline.     Cranial Nerves: No cranial nerve deficit.     Motor: No weakness.     Coordination: Coordination normal.     Gait: Gait normal.  Psychiatric:        Mood and Affect: Mood normal.        Behavior: Behavior normal.  Thought Content: Thought content normal.        Judgment: Judgment normal.           Assessment & Plan:  Pure hypercholesterolemia - Plan: CBC with Differential/Platelet, COMPLETE METABOLIC PANEL WITH GFR, Lipid panel, TSH  Other specified hypothyroidism - Plan: CBC with Differential/Platelet, COMPLETE METABOLIC PANEL WITH GFR, Lipid panel, TSH Check CBC CMP lipid panel and TSH.  LDL cholesterol is greater than 100 would recommend a statin.  Uptitrate levothyroxine if necessary to  achieve a TSH near 1-2.  Strongly recommended semaglutide to assist in weight loss along for fatty liver disease.  Patient will check with [facility] drug on the price.

## 2023-01-26 LAB — CBC WITH DIFFERENTIAL/PLATELET
Absolute Lymphocytes: 1224 {cells}/uL (ref 850–3900)
Absolute Monocytes: 490 {cells}/uL (ref 200–950)
Basophils Absolute: 61 {cells}/uL (ref 0–200)
Basophils Relative: 1.2 %
Eosinophils Absolute: 306 {cells}/uL (ref 15–500)
Eosinophils Relative: 6 %
HCT: 43.7 % (ref 35.0–45.0)
Hemoglobin: 14.4 g/dL (ref 11.7–15.5)
MCH: 32 pg (ref 27.0–33.0)
MCHC: 33 g/dL (ref 32.0–36.0)
MCV: 97.1 fL (ref 80.0–100.0)
MPV: 11.9 fL (ref 7.5–12.5)
Monocytes Relative: 9.6 %
Neutro Abs: 3019 {cells}/uL (ref 1500–7800)
Neutrophils Relative %: 59.2 %
Platelets: 151 10*3/uL (ref 140–400)
RBC: 4.5 10*6/uL (ref 3.80–5.10)
RDW: 12.4 % (ref 11.0–15.0)
Total Lymphocyte: 24 %
WBC: 5.1 10*3/uL (ref 3.8–10.8)

## 2023-01-26 LAB — COMPLETE METABOLIC PANEL WITH GFR
AG Ratio: 1.4 (calc) (ref 1.0–2.5)
ALT: 41 U/L — ABNORMAL HIGH (ref 6–29)
AST: 45 U/L — ABNORMAL HIGH (ref 10–35)
Albumin: 4.2 g/dL (ref 3.6–5.1)
Alkaline phosphatase (APISO): 94 U/L (ref 37–153)
BUN: 14 mg/dL (ref 7–25)
CO2: 20 mmol/L (ref 20–32)
Calcium: 9.3 mg/dL (ref 8.6–10.4)
Chloride: 106 mmol/L (ref 98–110)
Creat: 0.92 mg/dL (ref 0.50–1.05)
Globulin: 2.9 g/dL (ref 1.9–3.7)
Glucose, Bld: 108 mg/dL — ABNORMAL HIGH (ref 65–99)
Potassium: 4.3 mmol/L (ref 3.5–5.3)
Sodium: 140 mmol/L (ref 135–146)
Total Bilirubin: 0.3 mg/dL (ref 0.2–1.2)
Total Protein: 7.1 g/dL (ref 6.1–8.1)
eGFR: 68 mL/min/{1.73_m2} (ref >=60)

## 2023-01-26 LAB — LIPID PANEL
Cholesterol: 203 mg/dL — ABNORMAL HIGH (ref <200)
HDL: 42 mg/dL — ABNORMAL LOW (ref >=50)
LDL Cholesterol (Calc): 125 mg/dL — ABNORMAL HIGH
Non-HDL Cholesterol (Calc): 161 mg/dL — ABNORMAL HIGH (ref <130)
Total CHOL/HDL Ratio: 4.8 (calc) (ref <5.0)
Triglycerides: 225 mg/dL — ABNORMAL HIGH (ref <150)

## 2023-01-26 LAB — TSH: TSH: 1.53 m[IU]/L (ref 0.40–4.50)

## 2023-01-28 ENCOUNTER — Other Ambulatory Visit: Payer: Self-pay

## 2023-01-28 DIAGNOSIS — E7800 Pure hypercholesterolemia, unspecified: Secondary | ICD-10-CM

## 2023-01-28 MED ORDER — ATORVASTATIN CALCIUM 20 MG PO TABS
20.0000 mg | ORAL_TABLET | Freq: Every day | ORAL | 3 refills | Status: DC
Start: 2023-01-28 — End: 2024-01-03

## 2023-02-15 ENCOUNTER — Encounter: Payer: Self-pay | Admitting: *Deleted

## 2023-02-15 ENCOUNTER — Ambulatory Visit: Payer: Self-pay | Admitting: *Deleted

## 2023-02-15 ENCOUNTER — Telehealth: Payer: Self-pay | Admitting: *Deleted

## 2023-02-15 NOTE — Patient Outreach (Signed)
  Care Coordination   Initial Visit Note   02/15/2023  Name: Amanda Hoover MRN: [id number] DOB: [birth year]  Amanda Hoover is a 67 y.o. year old female who sees Pickard, Priscille Heidelberg, MD for primary care. I spoke with Amanda Hoover by phone today.  What matters to the patients health and wellness today?  Assessment completed & no interventions identified at this time.  SDOH assessments and interventions completed:  Yes.  SDOH Interventions Today    Flowsheet Row Most Recent Value  SDOH Interventions   Food Insecurity Interventions Intervention Not Indicated  Housing Interventions Intervention Not Indicated  Transportation Interventions Intervention Not Indicated, Patient Resources (Friends/Family)  Utilities Interventions Intervention Not Indicated  Alcohol Usage Interventions Intervention Not Indicated (Score <7)  Financial Strain Interventions Intervention Not Indicated  Physical Activity Interventions Patient Declined  Stress Interventions Intervention Not Indicated  Social Connections Interventions Intervention Not Indicated  Health Literacy Interventions Intervention Not Indicated     Care Coordination Interventions:  No, not indicated.   Follow up plan: No further intervention required.   Encounter Outcome:  Patient Visit Completed.   Danford Bad, BSW, MSW, Printmaker Social Work Case Set designer Health  [facility], Population Health Direct Dial: [phone]  Fax: [phone] Email: [email or web address]

## 2023-02-15 NOTE — Patient Outreach (Signed)
  Care Coordination   02/15/2023  Name: LAUREL HIRST MRN: [id number] DOB: September 05, 1955   Care Coordination Outreach Attempts:  An unsuccessful telephone outreach was attempted today to offer the patient information about available care coordination services. HIPAA compliant message left on voicemail providing contact information for CSW, encouraging patient to return CSW's call at her earliest convenience.  Follow Up Plan:  Additional outreach attempts will be made to offer the patient care coordination information and services.   Encounter Outcome:  No Answer.   Care Coordination Interventions:  No, not indicated.    Danford Bad, BSW, MSW, Printmaker Social Work Case Set designer Health  [facility], Population Health Direct Dial: [phone]  Fax: [phone] Email: [email or web address]

## 2023-02-15 NOTE — Patient Instructions (Signed)
Visit Information  Thank you for taking time to visit with me today. Please don't hesitate to contact me if I can be of assistance to you.   Please call the care guide team at [phone] if you need to cancel or reschedule your appointment.   If you are experiencing a Mental Health or Behavioral Health Crisis or need someone to talk to, please call the Suicide and Crisis Lifeline: 988 call the [place] National Suicide Prevention Lifeline: 680-401-3306 or TTY: (860) 590-6130 TTY 9786516315) to talk to a trained counselor call 1-800-273-TALK (toll free, 24 hour hotline) go to [facility] [address] [place] [phone]) call the [facility] Crisis Line: [phone] call 911  Patient verbalizes understanding of instructions and care plan provided today and agrees to view in MyChart. Active MyChart status and patient understanding of how to access instructions and care plan via MyChart confirmed with patient.     No further follow up required.  Danford Bad, BSW, MSW, Printmaker Social Work Case Set designer Health  [facility], Population Health Direct Dial: [phone]  Fax: [phone] Email: [email or web address]

## 2023-02-28 DIAGNOSIS — D225 Melanocytic nevi of trunk: Secondary | ICD-10-CM | POA: Diagnosis not present

## 2023-02-28 DIAGNOSIS — L308 Other specified dermatitis: Secondary | ICD-10-CM | POA: Diagnosis not present

## 2023-02-28 DIAGNOSIS — L570 Actinic keratosis: Secondary | ICD-10-CM | POA: Diagnosis not present

## 2023-02-28 DIAGNOSIS — X32XXXD Exposure to sunlight, subsequent encounter: Secondary | ICD-10-CM | POA: Diagnosis not present

## 2023-03-24 ENCOUNTER — Other Ambulatory Visit: Payer: Self-pay | Admitting: Obstetrics

## 2023-03-24 DIAGNOSIS — Z0000 Encounter for general adult medical examination without abnormal findings: Secondary | ICD-10-CM

## 2023-04-13 ENCOUNTER — Ambulatory Visit: Payer: Medicare Other | Admitting: Gastroenterology

## 2023-04-26 ENCOUNTER — Ambulatory Visit
Admission: RE | Admit: 2023-04-26 | Discharge: 2023-04-26 | Disposition: A | Discharge status: Home / Self Care | Payer: HMO | Source: Ambulatory Visit | Attending: Obstetrics | Admitting: Obstetrics

## 2023-04-26 ENCOUNTER — Ambulatory Visit
Admission: RE | Admit: 2023-04-26 | Discharge: 2023-04-26 | Disposition: A | Discharge status: Home / Self Care | Payer: HMO | Source: Ambulatory Visit | Attending: Family Medicine | Admitting: Family Medicine

## 2023-04-26 DIAGNOSIS — N958 Other specified menopausal and perimenopausal disorders: Secondary | ICD-10-CM | POA: Diagnosis not present

## 2023-04-26 DIAGNOSIS — M810 Age-related osteoporosis without current pathological fracture: Secondary | ICD-10-CM

## 2023-04-26 DIAGNOSIS — E2839 Other primary ovarian failure: Secondary | ICD-10-CM | POA: Diagnosis not present

## 2023-04-26 DIAGNOSIS — M8588 Other specified disorders of bone density and structure, other site: Secondary | ICD-10-CM | POA: Diagnosis not present

## 2023-04-26 DIAGNOSIS — Z1231 Encounter for screening mammogram for malignant neoplasm of breast: Secondary | ICD-10-CM | POA: Diagnosis not present

## 2023-04-26 DIAGNOSIS — Z0000 Encounter for general adult medical examination without abnormal findings: Secondary | ICD-10-CM

## 2023-05-05 ENCOUNTER — Telehealth: Payer: Self-pay

## 2023-05-05 NOTE — Telephone Encounter (Signed)
-----   Message from Nurse Bosco Paparella P sent at 11/02/2022 12:01 PM EDT ----- Repeat RUQ Korea in 6 months

## 2023-05-05 NOTE — Telephone Encounter (Signed)
Previous Dr Russella Dar pt needs office visit appt.

## 2023-05-05 NOTE — Telephone Encounter (Signed)
I spoke with the pt and made her aware that she is due for an [place] with Dr Russella Dar.  Since he has retired and she has complaints of abd discomfort and questions regarding gallstones.  She has been set up for an appt with Dr Doy Hutching on 4/29.  The pt has been advised of the information and verbalized understanding.

## 2023-05-12 ENCOUNTER — Ambulatory Visit (INDEPENDENT_AMBULATORY_CARE_PROVIDER_SITE_OTHER): Payer: HMO | Admitting: Family Medicine

## 2023-05-12 ENCOUNTER — Encounter: Payer: Self-pay | Admitting: Family Medicine

## 2023-05-12 ENCOUNTER — Telehealth: Payer: Self-pay

## 2023-05-12 ENCOUNTER — Other Ambulatory Visit: Payer: Self-pay | Admitting: Family Medicine

## 2023-05-12 VITALS — BP 138/76 | HR 80 | Temp 98.3°F | Resp 16 | Ht 65.0 in | Wt 252.8 lb

## 2023-05-12 DIAGNOSIS — M810 Age-related osteoporosis without current pathological fracture: Secondary | ICD-10-CM | POA: Diagnosis not present

## 2023-05-12 MED ORDER — ALPRAZOLAM 0.5 MG PO TABS: 0.5000 mg | ORAL_TABLET | Freq: Two times a day (BID) | ORAL | 0 refills | Status: AC | PRN

## 2023-05-12 MED ORDER — ALENDRONATE SODIUM 70 MG PO TABS: 70.0000 mg | ORAL_TABLET | ORAL | 11 refills | Status: AC

## 2023-05-12 NOTE — Progress Notes (Signed)
 Subjective:    Patient ID: Amanda Hoover, female    DOB: July 02, 1955, 68 y.o.   MRN: [id number]  HPI   Patient recently had a bone density test that showed a T-score that had worsened in her hip to -3.2.  She is currently taking calcium 1200 mg a day and vitamin D 1000 units a day.  She has been hesitant to take Fosamax.  She would like to discuss this more.  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   Breast cancer, right (HCC)    ER 0 PR0 LN neg.   Chest pain    a. false positive NST in 10/2020 with cath in 11/2020 showing only minimal CAD with 10% mid-LAD stenosis.   Chronic headaches    Chronic ITP (idiopathic thrombocytopenia) (HCC)    mild thrombocytopenia, normal spleen on [place] 11/18, mild cirrhosis   Cirrhosis (HCC)    Dental crowns present    Depression    Fatty liver    GERD (gastroesophageal reflux disease)    Heart murmur    Hiatal hernia    History of colon polyps    History of esophageal stricture    Status post esophageal dilatation   History of radiation therapy 03/28/2012-05/17/2012   60.4 gray to right breast   Hyperlipidemia    Hypothyroid    Multiple environmental allergies    Multiple food allergies    Osteopenia    Osteoporosis    Palpitations    Personal history of radiation therapy 2013   Right Breast Cancer   Rash 01/06/2012   right elbow   Rhinitis, allergic    rhinitis   Past Surgical History:  Procedure Laterality Date   BREAST LUMPECTOMY Right 2013   COLONOSCOPY     COLONOSCOPY W/ POLYPECTOMY     ESOPHAGOGASTRODUODENOSCOPY     LEFT HEART CATH AND CORONARY ANGIOGRAPHY N/A 11/19/2020   Procedure: LEFT HEART CATH AND CORONARY ANGIOGRAPHY;  Surgeon: Lennette Bihari, MD;  Location: [facility];  Service: Cardiovascular;  Laterality: N/A;   MASTECTOMY Right 2013   partial mast   PARTIAL  MASTECTOMY WITH NEEDLE LOCALIZATION AND AXILLARY SENTINEL LYMPH NODE BX  01/12/2012   Procedure: PARTIAL MASTECTOMY WITH NEEDLE LOCALIZATION AND AXILLARY SENTINEL LYMPH NODE BX;  Surgeon: Ernestene Mention, MD;  Location: [facility];  Service: General;  Laterality: Right;   RE-EXCISION OF BREAST CANCER,SUPERIOR MARGINS  02/15/2012   Procedure: RE-EXCISION OF BREAST CANCER,SUPERIOR MARGINS;  Surgeon: Ernestene Mention, MD;  Location: [facility];  Service: General;  Laterality: Right;  Re-excision of right lumpectomy, close posterior margin.   Current Outpatient Medications on File Prior to Visit  Medication Sig Dispense Refill   atorvastatin (LIPITOR) 20 MG tablet Take 1 tablet (20 mg total) by mouth daily. 90 tablet 3   ALPRAZolam (XANAX) 0.5 MG tablet Take 1 tablet (0.5 mg total) by mouth 2 (two) times daily as needed for anxiety. 30 tablet 0   ascorbic acid (VITAMIN C) 500 MG tablet Take 500 mg by mouth daily.     levothyroxine (SYNTHROID) 100 MCG tablet Take 1 tablet (100 mcg total) by mouth daily. 90 tablet 3   Methylcobalamin (B12-ACTIVE PO) Take 1 tablet by mouth daily.     Multiple Vitamin (MULTIVITAMIN) tablet Take 1 tablet by mouth daily.  neomycin-polymyxin-hydrocortisone (CORTISPORIN) OTIC solution Place 3 drops into the right ear 4 (four) times daily. 10 mL 0   pantoprazole (PROTONIX) 40 MG tablet TAKE 1 TABLET(40 MG) BY MOUTH DAILY 90 tablet 3   phentermine 37.5 MG capsule Take 1 capsule (37.5 mg total) by mouth every morning. 30 capsule 2   VITAMIN D PO Take 1 capsule by mouth daily.     zinc gluconate 50 MG tablet Take 50 mg by mouth daily.     No current facility-administered medications on file prior to visit.   Allergies  Allergen Reactions   Macadamia Nut Oil Anaphylaxis   Other Swelling    POPCORN:  SWELLING THROAT   Peanut-Containing Drug Products Swelling and Other (See Comments)    THROAT TIGHTNESS; ALSO WALNUTS   Pistachio Nut  (Diagnostic) Anaphylaxis   Tomato Swelling    SWELLING UVULA   Adhesive [Tape] Other (See Comments)    BLISTERS   Flour Other (See Comments)    SNEEZING   Penicillins Hives   Azithromycin Swelling    Per pt lips swells up   Oxycodone     Unknown reaction   Sulfa Antibiotics Nausea Only   Vicodin [Hydrocodone-Acetaminophen] Hives and Swelling    Pt had flushing, then itching and swelling of lips   Soap Other (See Comments)    FRAGRANCES (SOAP/LOTION/PERFUME):  HEADACHE, RUNNY NOSE   Tramadol Nausea And Vomiting   Social History   Socioeconomic History   Marital status: Married    Spouse name: Torrie Namba   Number of children: 4   Years of education: 12   Highest education level: 12th grade  Occupational History    Employer: [employer]    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[employer]  Tobacco Use   Smoking status: Former    Current packs/day: 0.00    Average packs/day: 0.5 packs/day for 8.0 years (4.0 ttl pk-yrs)    Types: Cigarettes    Start date: 03/15/1996    Quit date: 03/15/2004    Years since quitting: 19.1    Passive exposure: Past   Smokeless tobacco: Never  Vaping Use   Vaping status: Never Used  Substance and Sexual Activity   Alcohol use: No   Drug use: No    Comment: quit smoking 11 years ago   Sexual activity: Yes    Birth control/protection: Post-menopausal  Other Topics Concern   Not on file  Social History Narrative   1 daughter and 3 sons   5 grandchildren   Social Drivers of Corporate investment banker Strain: Low Risk  (02/15/2023)   Overall Financial Resource Strain (CARDIA)    Difficulty of Paying Living Expenses: Not hard at all  Food Insecurity: No Food Insecurity (02/15/2023)   Hunger Vital Sign    Worried About Running Out of Food in the Last Year: Never true    Ran Out of Food in the Last Year: Never true  Transportation Needs: No Transportation Needs (02/15/2023)   PRAPARE - Administrator, Civil Service (Medical): No     Lack of Transportation (Non-Medical): No  Physical Activity: Inactive (02/15/2023)   Exercise Vital Sign    Days of Exercise per Week: 0 days    Minutes of Exercise per Session: 0 min  Stress: No Stress Concern Present (02/15/2023)   [employer] of Occupational Health - Occupational Stress Questionnaire    Feeling of Stress : Not at all  Social Connections: Socially Integrated (02/15/2023)   Social Connection and  Isolation Panel [NHANES]    Frequency of Communication with Friends and Family: More than three times a week    Frequency of Social Gatherings with Friends and Family: More than three times a week    Attends Religious Services: More than 4 times per year    Active Member of [employer] or Organizations: Yes    Attends Banker Meetings: More than 4 times per year    Marital Status: Married  Catering manager Violence: Not At Risk (02/15/2023)   Humiliation, Afraid, Rape, and Kick questionnaire    Fear of Current or Ex-Partner: No    Emotionally Abused: No    Physically Abused: No    Sexually Abused: No      Review of Systems     Objective:   Physical Exam Vitals reviewed.  Constitutional:      General: She is not in acute distress.    Appearance: She is obese. She is not toxic-appearing or diaphoretic.  HENT:     Nose: No mucosal edema.     Right Sinus: No maxillary sinus tenderness or frontal sinus tenderness.     Left Sinus: No maxillary sinus tenderness or frontal sinus tenderness.  Neck:     Thyroid: No thyroid mass, thyromegaly or thyroid tenderness.  Cardiovascular:     Rate and Rhythm: Normal rate and regular rhythm.     Heart sounds: Normal heart sounds. No murmur heard.    No friction rub. No gallop.  Pulmonary:     Effort: Pulmonary effort is normal. No respiratory distress.     Breath sounds: Normal breath sounds. No stridor. No wheezing or rales.  Musculoskeletal:     Cervical back: Full passive range of motion without pain. No edema or  erythema. No pain with movement or muscular tenderness. Normal range of motion.  Neurological:     Mental Status: She is alert.  Psychiatric:        Mood and Affect: Mood normal.        Behavior: Behavior normal.        Thought Content: Thought content normal.        Judgment: Judgment normal.           Assessment & Plan:  Age related osteoporosis, unspecified pathological fracture presence Patient has osteoporosis in the hip.  I recommended adding Fosamax 70 mg weekly and rechecking a bone density test in 2 years.  Continue calcium 1200 mg a day and vitamin D 1000 units a day.  Increase weightbearing exercise.  Spent approximately 15 minutes today with the patient discussing her bone density test results and her options.  If Fosamax is not beneficial to her or if she is unable to tolerate the medicine, consider Prolia versus Evenity

## 2023-05-12 NOTE — Telephone Encounter (Signed)
 Pt forgot to tell you before she left that she needs a refill on her Alprazolam sent to [facility] Thanks.

## 2023-06-01 ENCOUNTER — Other Ambulatory Visit: Payer: HMO

## 2023-06-01 DIAGNOSIS — E7800 Pure hypercholesterolemia, unspecified: Secondary | ICD-10-CM

## 2023-06-01 DIAGNOSIS — D693 Immune thrombocytopenic purpura: Secondary | ICD-10-CM

## 2023-06-01 DIAGNOSIS — E038 Other specified hypothyroidism: Secondary | ICD-10-CM | POA: Diagnosis not present

## 2023-06-01 DIAGNOSIS — E559 Vitamin D deficiency, unspecified: Secondary | ICD-10-CM

## 2023-06-01 DIAGNOSIS — G8929 Other chronic pain: Secondary | ICD-10-CM | POA: Diagnosis not present

## 2023-06-01 DIAGNOSIS — R748 Abnormal levels of other serum enzymes: Secondary | ICD-10-CM | POA: Diagnosis not present

## 2023-06-01 DIAGNOSIS — R519 Headache, unspecified: Secondary | ICD-10-CM | POA: Diagnosis not present

## 2023-06-02 LAB — CBC WITH DIFFERENTIAL/PLATELET
Absolute Lymphocytes: 1409 {cells}/uL (ref 850–3900)
Absolute Monocytes: 562 {cells}/uL (ref 200–950)
Basophils Absolute: 49 {cells}/uL (ref 0–200)
Basophils Relative: 0.9 %
Eosinophils Absolute: 221 {cells}/uL (ref 15–500)
Eosinophils Relative: 4.1 %
HCT: 42.7 % (ref 35.0–45.0)
Hemoglobin: 14 g/dL (ref 11.7–15.5)
MCH: 31.7 pg (ref 27.0–33.0)
MCHC: 32.8 g/dL (ref 32.0–36.0)
MCV: 96.6 fL (ref 80.0–100.0)
MPV: 11.7 fL (ref 7.5–12.5)
Monocytes Relative: 10.4 %
Neutro Abs: 3159 {cells}/uL (ref 1500–7800)
Neutrophils Relative %: 58.5 %
Platelets: 150 10*3/uL (ref 140–400)
RBC: 4.42 10*6/uL (ref 3.80–5.10)
RDW: 12.4 % (ref 11.0–15.0)
Total Lymphocyte: 26.1 %
WBC: 5.4 10*3/uL (ref 3.8–10.8)

## 2023-06-02 LAB — COMPLETE METABOLIC PANEL WITH GFR
AG Ratio: 1.7 (calc) (ref 1.0–2.5)
ALT: 39 U/L — ABNORMAL HIGH (ref 6–29)
AST: 45 U/L — ABNORMAL HIGH (ref 10–35)
Albumin: 4.3 g/dL (ref 3.6–5.1)
Alkaline phosphatase (APISO): 91 U/L (ref 37–153)
BUN: 12 mg/dL (ref 7–25)
CO2: 27 mmol/L (ref 20–32)
Calcium: 9.9 mg/dL (ref 8.6–10.4)
Chloride: 106 mmol/L (ref 98–110)
Creat: 0.98 mg/dL (ref 0.50–1.05)
Globulin: 2.6 g/dL (ref 1.9–3.7)
Glucose, Bld: 107 mg/dL — ABNORMAL HIGH (ref 65–99)
Potassium: 4.6 mmol/L (ref 3.5–5.3)
Sodium: 140 mmol/L (ref 135–146)
Total Bilirubin: 0.5 mg/dL (ref 0.2–1.2)
Total Protein: 6.9 g/dL (ref 6.1–8.1)

## 2023-06-02 LAB — LIPID PANEL
Cholesterol: 137 mg/dL (ref <200)
HDL: 46 mg/dL — ABNORMAL LOW (ref >=50)
LDL Cholesterol (Calc): 67 mg/dL
Non-HDL Cholesterol (Calc): 91 mg/dL (ref <130)
Total CHOL/HDL Ratio: 3 (calc) (ref <5.0)
Triglycerides: 163 mg/dL — ABNORMAL HIGH (ref <150)

## 2023-06-02 LAB — TSH: TSH: 1.27 m[IU]/L (ref 0.40–4.50)

## 2023-07-11 NOTE — Progress Notes (Signed)
 [facility] Return Visit   Referring Provider Austine Lefort, MD [address] [place],  [place] [address]  Primary Care Provider Pickard, Cisco Crest, MD  Patient Profile: Amanda Hoover is a 68 y.o. female with a past medical history noteworthy for breast cancer, chronic ITP, osteopenia, headaches who returns to the [facility] for follow-up of the problem(s) noted below.  Problem List: Compensated cirrhosis, secondary to MASH GERD History of colon polyps-tubular adenomas and sessile serrated polyps Cholelithiasis   History of Present Illness   Amanda Hoover was last seen in the GI office 04/22/2022 by Dr. Sandrea Cruel   Current GI Meds  Pantoprazole  40 mg p.o. daily  Interval History   Compensated cirrhosis 2/2 MASH -- Laboratory evaluation for chronic hepatitides 12/2018 negative; noted to be hepatitis B surface antibody nonreactive -- Denies jaundice, dark urine, easy bruising, confusion -- States that she had an episode of rectal bleeding after taking frequent doses of Aleve which resolved with cessation of NSAID -- Endorses shortness of breath on exertion as well as a 12 pound weight gain over the last month -- Notes increased lower extremity edema that is dependent in nature -- Denies chest pain or chronic heart issues  -- AUS 11/02/2022 -cholelithiasis without cholecystitis; increased echotexture of the liver which is nonspecific but can be seen in the setting of hepatic steatosis/cirrhosis -- Due for repeat ultrasound, AFP, PT/INR -- No varices on EGD 04/2022 -repeat 2026-2027  GERD -- EGD 04/2022 - variable Z-line, no varices, small HH, small gastric polyps -- GERD symptoms well-controlled on pantoprazole  40 mg p.o. daily -- Denies regurgitation, pyrosis, dysphagia  History of colon polyps -- Colonoscopy 04/2022 - four 6-7 mm polyps (TA, HP), IH - repeat 3 years 04/2025  Cholelithiasis -- Incidental finding of cholelithiasis on  most recent ultrasound -- No history of biliary colic -- Reviewed that this does not require any intervention  Last colonoscopy:  04/2022 -four 6-7 mm polyps (TA, HP), IH Last endoscopy: 04/2022 - variable Z-line, no varices, small HH, small gastric polyps  Last Abd CT/CTE/MRE: None recent  GI Review of Symptoms Significant for none. Otherwise negative.  General Review of Systems  Review of systems is significant for the pertinent positives and negatives as listed per the HPI.  Full ROS is otherwise negative.  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   Breast cancer, right (HCC)    ER 0 PR0 LN neg.   Chest pain    a. false positive NST in 10/2020 with cath in 11/2020 showing only minimal CAD with 10% mid-LAD stenosis.   Chronic headaches    Chronic ITP (idiopathic thrombocytopenia) (HCC)    mild thrombocytopenia, normal spleen on us  11/18, mild cirrhosis   Cirrhosis (HCC)    Dental crowns present    Depression    Fatty liver    GERD (gastroesophageal reflux disease)    Heart murmur    Hiatal hernia    History of colon polyps    History of esophageal stricture    Status post esophageal dilatation   History of radiation therapy 03/28/2012-05/17/2012   60.4 gray to right breast   Hyperlipidemia    Hypothyroid    Multiple environmental allergies    Multiple food allergies    Osteopenia  Osteoporosis    Palpitations    Personal history of radiation therapy 2013   Right Breast Cancer   Rash 01/06/2012   right elbow   Rhinitis, allergic    rhinitis     Past Surgical History   Past Surgical History:  Procedure Laterality Date   BREAST LUMPECTOMY Right 2013   COLONOSCOPY     COLONOSCOPY W/ POLYPECTOMY     ESOPHAGOGASTRODUODENOSCOPY     LEFT HEART CATH AND CORONARY ANGIOGRAPHY N/A 11/19/2020   Procedure:  LEFT HEART CATH AND CORONARY ANGIOGRAPHY;  Surgeon: Millicent Ally, MD;  Location: [facility];  Service: Cardiovascular;  Laterality: N/A;   MASTECTOMY Right 2013   partial mast   PARTIAL MASTECTOMY WITH NEEDLE LOCALIZATION AND AXILLARY SENTINEL LYMPH NODE BX  01/12/2012   Procedure: PARTIAL MASTECTOMY WITH NEEDLE LOCALIZATION AND AXILLARY SENTINEL LYMPH NODE BX;  Surgeon: Levert Ready, MD;  Location: [facility];  Service: General;  Laterality: Right;   RE-EXCISION OF BREAST CANCER,SUPERIOR MARGINS  02/15/2012   Procedure: RE-EXCISION OF BREAST CANCER,SUPERIOR MARGINS;  Surgeon: Levert Ready, MD;  Location: [facility];  Service: General;  Laterality: Right;  Re-excision of right lumpectomy, close posterior margin.     Allergies and Medications   Allergies  Allergen Reactions   Macadamia Nut Oil Anaphylaxis   Other Swelling    POPCORN:  SWELLING THROAT   Peanut-Containing Drug Products Swelling and Other (See Comments)    THROAT TIGHTNESS; ALSO WALNUTS   Pistachio Nut (Diagnostic) Anaphylaxis   Tomato Swelling    SWELLING UVULA   Adhesive [Tape] Other (See Comments)    BLISTERS   Flour Other (See Comments)    SNEEZING   Penicillins Hives   Azithromycin  Swelling    Per pt lips swells up   Oxycodone      Unknown reaction   Sulfa Antibiotics Nausea Only   Vicodin [Hydrocodone -Acetaminophen ] Hives and Swelling    Pt had flushing, then itching and swelling of lips   Soap Other (See Comments)    FRAGRANCES (SOAP/LOTION/PERFUME):  HEADACHE, RUNNY NOSE   Tramadol  Nausea And Vomiting    Current Meds  Medication Sig   alendronate  (FOSAMAX ) 70 MG tablet Take 1 tablet (70 mg total) by mouth every 7 (seven) days. Take with a full glass of water on an empty stomach.   ALPRAZolam  (XANAX ) 0.5 MG tablet Take 1 tablet (0.5 mg total) by mouth 2 (two) times daily as needed for anxiety.   ascorbic acid (VITAMIN C) 500 MG tablet Take 500 mg by mouth  daily.   atorvastatin  (LIPITOR) 20 MG tablet Take 1 tablet (20 mg total) by mouth daily.   levothyroxine  (SYNTHROID ) 100 MCG tablet Take 1 tablet (100 mcg total) by mouth daily.   Methylcobalamin (B12-ACTIVE PO) Take 1 tablet by mouth daily.   Multiple Vitamin (MULTIVITAMIN) tablet Take 1 tablet by mouth daily.   pantoprazole  (PROTONIX ) 40 MG tablet TAKE 1 TABLET(40 MG) BY MOUTH DAILY   VITAMIN D  PO Take 1 capsule by mouth daily.   zinc gluconate 50 MG tablet Take 50 mg by mouth daily.     Family History   Family History  Problem Relation Age of Onset   Crohn's disease Mother    Irritable bowel syndrome Mother    Hypertension Mother    Hyperlipidemia Mother    Stroke Mother    Thyroid  disease Mother    Depression Mother    Esophageal cancer Father 25   Cancer Father  Cancer Maternal Uncle        unknown form of cancer   Breast cancer Paternal Aunt 86   Diabetes Maternal Grandmother    Breast cancer Paternal Grandmother 40   Diabetes Paternal Grandmother    Cancer Paternal Grandmother    Melanoma Cousin        maternal cousin dx in her 36s   Breast cancer Other    Liver disease Other    Colon cancer Neg Hx    Rectal cancer Neg Hx    Stomach cancer Neg Hx    Colon polyps Neg Hx     Social History   Social History   Tobacco Use   Smoking status: Former    Current packs/day: 0.00    Average packs/day: 0.5 packs/day for 8.0 years (4.0 ttl pk-yrs)    Types: Cigarettes    Start date: 03/15/1996    Quit date: 03/15/2004    Years since quitting: 19.3    Passive exposure: Past   Smokeless tobacco: Never  Vaping Use   Vaping status: Never Used  Substance Use Topics   Alcohol use: No   Drug use: No    Comment: quit smoking 11 years ago   Khadija reports that she quit smoking about 19 years ago. Her smoking use included cigarettes. She started smoking about 27 years ago. She has a 4 pack-year smoking history. She has been exposed to tobacco smoke. She has never used  smokeless tobacco. She reports that she does not drink alcohol and does not use drugs.  Vital Signs and Physical Examination   Vitals:   07/12/23 1507  BP: 120/78  Pulse: 83   Body mass index is 42.57 kg/m. Weight: 248 lb (112.5 kg)  General: Well developed, well nourished, no acute distress Head: Normocephalic and atraumatic Eyes: Sclerae anicteric Lungs: Clear throughout to auscultation Heart: Regular rate and rhythm; No murmurs, rubs or bruits Abdomen: Soft, non tender and non distended. No masses, hepatosplenomegaly or hernias noted. Normal Bowel sounds, no ascites appreciated on physical exam Rectal: Deferred Musculoskeletal: Symmetrical with no gross deformities  Extremities: Trace edema in bilateral lower extremities Neurological: Alert oriented x 4, grossly nonfocal Psychological:  Alert and cooperative. Normal mood and affect   Review of Data  The following data was reviewed at the time of this encounter:  Laboratory Studies      Latest Ref Rng & Units 06/01/2023    9:09 AM 01/25/2023    9:25 AM 10/15/2022    8:10 AM  CBC  WBC 3.8 - 10.8 Thousand/uL 5.4  5.1  6.4   Hemoglobin 11.7 - 15.5 g/dL 47.8  29.5  62.1   Hematocrit 35.0 - 45.0 % 42.7  43.7  43.1   Platelets 140 - 400 Thousand/uL 150  151  162     No results found for: "LIPASE"    Latest Ref Rng & Units 06/01/2023    9:09 AM 01/25/2023    9:25 AM 10/15/2022    8:10 AM  CMP  Glucose 65 - 99 mg/dL 308  657  846   BUN 7 - 25 mg/dL 12  14  16    Creatinine 0.50 - 1.05 mg/dL 9.62  9.52  8.41   Sodium 135 - 146 mmol/L 140  140  137   Potassium 3.5 - 5.3 mmol/L 4.6  4.3  4.4   Chloride 98 - 110 mmol/L 106  106  102   CO2 20 - 32 mmol/L 27  20  24  Calcium  8.6 - 10.4 mg/dL 9.9  9.3  14.7   Total Protein 6.1 - 8.1 g/dL 6.9  7.1  7.4   Total Bilirubin 0.2 - 1.2 mg/dL 0.5  0.3  0.7   AST 10 - 35 U/L 45  45  58   ALT 6 - 29 U/L 39  41  52      Imaging Studies  AUS 10/2022 1. Cholelithiasis without  sonographic evidence for acute cholecystitis. 2. Increased echotexture of the liver. This is nonspecific but can be seen in the setting of hepatic steatosis/cirrhosis.  AUS 04/28/2022 1. Cholelithiasis without sonographic evidence of acute cholecystitis. 2. Diffuse increased echotexture of the liver this can be seen in cirrhosis of liver.  AUS 12/07/2019 1. Cirrhosis. No focal liver abnormality.   RUQ 05/29/2019 Stable appearance of the right upper quadrant of the abdomen with morphologic changes of hepatic cirrhosis. No acute findings or focal liver lesions identified.     GI Procedures and Studies  EGD/Colonoscopy 04/2022 EGD - variable Z-line, no varices, small HH, small gastric polyps Colonoscopy - four 6-7 mm polyps (TA, HP), IH Path: Reflux changes, fundic gland polyps, TA and HP  EGD 05/04/2019 Variable Z-line, multiple gastric polyps Path: Fundic gland polyps  Colonoscopy 03/03/2017 - Four 6 to 8 mm polyps in the sigmoid colon, in the descending colon and in the transverse colon, removed with a cold snare. Resected and retrieved.  - One 5 mm polyp in the ascending colon, removed with a cold biopsy forceps. Resected and retrieved.  - Internal hemorrhoids.  - The examination was otherwise normal on direct and retroflexion views. Path: TA, SSP, HP  EGD/Colonoscopy 12/14/2011 EGD -irregular Z-line, small HH Colonoscopy -2 sessile polyps in TC, sessile polyp in DC Path: Benign reflux changes, serrated adenoma and hyperplastic polyp  EGD 08/05/2004 Stricture in the distal esophagus Savary dilated to 17 mm Path: Reflux changes  Clinical Impression  It is my clinical impression that Amanda Hoover is a 68 y.o. female with;  Compensated cirrhosis, secondary to MASH GERD History of colon polyps-tubular adenomas and sessile serrated polyps Cholelithiasis  Amanda Hoover presents for follow-up of compensated cirrhosis most likely secondary to [facility]; laboratory evaluation for chronic  hepatitides in 12/2018 ruled out other etiologies.  During our interview today she endorsed increasing edema in her ankles bilaterally, shortness of breath with exertion and a 12 pound weight gain over the last month.  Laboratory studies obtained today are stable from a liver perspective.  Advised her to speak with her primary care doctor to ensure immune new cardiac issues have evolved.  At today's visit I advised updating laboratory tests and HCC screening with an AFP and abdominal ultrasound.  She is up-to-date for esophageal varices screening in 2024 with no varices noted  GERD is currently stable on pantoprazole  40 mg orally daily and no changes will be made today.  She has a history of colonic tubular adenomas and sessile serrated polyps-last colonoscopy was performed in 2024 with 1 tubular adenoma noted.  Next colonoscopy due in 2031.  Plan  Labs today: PT, INR, AFP HCC screening: Schedule AUS ultrasound and AFP as above Esophageal varices screening: No varices due 2024 -next EGD due 2020 08-2025 Colon polyp surveillance: Next colonoscopy due 04/2029 GERD: Continue pantoprazole  40 mg orally daily As above, patient referred to PCP to discuss symptoms of increasing peripheral edema and shortness of breath   Planned Follow Up 6 months  The patient or caregiver verbalized understanding of the material  covered, with no barriers to understanding. All questions were answered. Patient or caregiver is agreeable with the plan outlined above.    It was a pleasure to see Amanda Hoover.  If you have any questions or concerns regarding this evaluation, do not hesitate to contact me.  Eugenia Hess, MD [facility]

## 2023-07-12 ENCOUNTER — Ambulatory Visit: Payer: Medicare Other | Admitting: Pediatrics

## 2023-07-12 ENCOUNTER — Encounter: Payer: Self-pay | Admitting: Pediatrics

## 2023-07-12 ENCOUNTER — Other Ambulatory Visit (INDEPENDENT_AMBULATORY_CARE_PROVIDER_SITE_OTHER)

## 2023-07-12 VITALS — BP 120/78 | HR 83 | Ht 64.0 in | Wt 248.0 lb

## 2023-07-12 DIAGNOSIS — Z860100 Personal history of colon polyps, unspecified: Secondary | ICD-10-CM

## 2023-07-12 DIAGNOSIS — K219 Gastro-esophageal reflux disease without esophagitis: Secondary | ICD-10-CM | POA: Diagnosis not present

## 2023-07-12 DIAGNOSIS — K8020 Calculus of gallbladder without cholecystitis without obstruction: Secondary | ICD-10-CM

## 2023-07-12 DIAGNOSIS — Z860101 Personal history of adenomatous and serrated colon polyps: Secondary | ICD-10-CM

## 2023-07-12 DIAGNOSIS — K7460 Unspecified cirrhosis of liver: Secondary | ICD-10-CM | POA: Diagnosis not present

## 2023-07-12 DIAGNOSIS — K7581 Nonalcoholic steatohepatitis (NASH): Secondary | ICD-10-CM

## 2023-07-12 NOTE — Patient Instructions (Signed)
 Your provider has requested that you go to the basement level for lab work before leaving today. Press "B" on the elevator. The lab is located at the first door on the left as you exit the elevator.  Due to recent changes in healthcare laws, you may see the results of your imaging and laboratory studies on MyChart before your provider has had a chance to review them.  We understand that in some cases there may be results that are confusing or concerning to you. Not all laboratory results come back in the same time frame and the provider may be waiting for multiple results in order to interpret others.  Please give us  48 hours in order for your provider to thoroughly review all the results before contacting the office for clarification of your results.    You have been scheduled for an abdominal ultrasound at [facility] (1st floor of hospital) on Tuesday, 07/19/23 at 8:30am. Please arrive 30 minutes prior to your appointment for registration. Make certain not to have anything to eat or drink after midnight prior to your appointment. Should you need to reschedule your appointment, please contact radiology at [phone]. This test typically takes about 30 minutes to perform.  Follow up in 6 months.  Thank you for entrusting me with your care and for choosing [facility],  Dr. Eugenia Hess   _______________________________________________________  If your blood pressure at your visit was 140/90 or greater, please contact your primary care physician to follow up on this.  _______________________________________________________  If you are age 64 or older, your body mass index should be between 23-30. Your Body mass index is 42.57 kg/m. If this is out of the aforementioned range listed, please consider follow up with your Primary Care Provider.  If you are age 36 or younger, your body mass index should be between 19-25. Your Body mass index is 42.57 kg/m. If this is out of the  aformentioned range listed, please consider follow up with your Primary Care Provider.   ________________________________________________________  The [facility] providers would like to encourage you to use MYCHART to communicate with providers for non-urgent requests or questions.  Due to long hold times on the telephone, sending your provider a message by [facility] may be a faster and more efficient way to get a response.  Please allow 48 business hours for a response.  Please remember that this is for non-urgent requests.  _______________________________________________________

## 2023-07-13 ENCOUNTER — Encounter: Payer: Self-pay | Admitting: Pediatrics

## 2023-07-13 LAB — PROTIME-INR
INR: 0.8 ratio (ref 0.8–1.0)
Prothrombin Time: 9 s — ABNORMAL LOW (ref 9.6–13.1)

## 2023-07-14 ENCOUNTER — Ambulatory Visit (INDEPENDENT_AMBULATORY_CARE_PROVIDER_SITE_OTHER): Admitting: Family Medicine

## 2023-07-14 ENCOUNTER — Encounter: Payer: Self-pay | Admitting: Family Medicine

## 2023-07-14 VITALS — BP 124/72 | HR 109 | Temp 98.1°F | Ht 64.0 in | Wt 248.8 lb

## 2023-07-14 DIAGNOSIS — M7989 Other specified soft tissue disorders: Secondary | ICD-10-CM | POA: Diagnosis not present

## 2023-07-14 DIAGNOSIS — K7460 Unspecified cirrhosis of liver: Secondary | ICD-10-CM | POA: Diagnosis not present

## 2023-07-14 LAB — AFP TUMOR MARKER: AFP-Tumor Marker: 5.4 ng/mL

## 2023-07-14 MED ORDER — FUROSEMIDE 20 MG PO TABS: 20.0000 mg | ORAL_TABLET | Freq: Every day | ORAL | 3 refills | Status: AC

## 2023-07-14 NOTE — Progress Notes (Signed)
 Due to the length thank coverage you were tight  Subjective:    Patient ID: Amanda Hoover, female    DOB: [birth year], 68 y.o.   MRN: [id number]  HPI   Patient had a catheterization performed in 2022.  At that time she had mild nonobstructive coronary artery disease.  Ejection fraction was 55-60.  Patient states that she has worsening edema in the lower extremities.  She has +1 pitting edema up to the midshin bilaterally.  She also reports increasing dyspnea on exertion.  She has resting tachycardia.  She states that this has been getting worse over the last 5 months.  Patient weighed 236 pounds 1 year ago.  She is near 250 pounds now.  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   Breast cancer, right (HCC)    ER 0 PR0 LN neg.   Chest pain    a. false positive NST in 10/2020 with cath in 11/2020 showing only minimal CAD with 10% mid-LAD stenosis.   Chronic headaches    Chronic ITP (idiopathic thrombocytopenia) (HCC)    mild thrombocytopenia, normal spleen on us  11/18, mild cirrhosis   Cirrhosis (HCC)    Dental crowns present    Depression    Fatty liver    GERD (gastroesophageal reflux disease)    Heart murmur    Hiatal hernia    History of colon polyps    History of esophageal stricture    Status post esophageal dilatation   History of radiation therapy 03/28/2012-05/17/2012   60.4 gray to right breast   Hyperlipidemia    Hypothyroid    Multiple environmental allergies    Multiple food allergies    Osteopenia    Osteoporosis    Palpitations    Personal history of radiation therapy 2013   Right Breast Cancer   Rash 01/06/2012   right elbow   Rhinitis, allergic    rhinitis   Past Surgical History:  Procedure Laterality Date   BREAST LUMPECTOMY Right 2013   COLONOSCOPY      COLONOSCOPY W/ POLYPECTOMY     ESOPHAGOGASTRODUODENOSCOPY     LEFT HEART CATH AND CORONARY ANGIOGRAPHY N/A 11/19/2020   Procedure: LEFT HEART CATH AND CORONARY ANGIOGRAPHY;  Surgeon: Millicent Ally, MD;  Location: [facility];  Service: Cardiovascular;  Laterality: N/A;   MASTECTOMY Right 2013   partial mast   PARTIAL MASTECTOMY WITH NEEDLE LOCALIZATION AND AXILLARY SENTINEL LYMPH NODE BX  01/12/2012   Procedure: PARTIAL MASTECTOMY WITH NEEDLE LOCALIZATION AND AXILLARY SENTINEL LYMPH NODE BX;  Surgeon: Levert Ready, MD;  Location: [facility];  Service: General;  Laterality: Right;   RE-EXCISION OF BREAST CANCER,SUPERIOR MARGINS  02/15/2012   Procedure: RE-EXCISION OF BREAST CANCER,SUPERIOR MARGINS;  Surgeon: Levert Ready, MD;  Location: [facility];  Service: General;  Laterality: Right;  Re-excision of right lumpectomy, close posterior margin.   Current Outpatient Medications on File Prior to Visit  Medication Sig Dispense Refill   alendronate  (FOSAMAX ) 70 MG tablet Take 1 tablet (70 mg total) by mouth every 7 (seven) days. Take with a full glass of water on an empty stomach. 4 tablet 11   ALPRAZolam  (XANAX ) 0.5 MG tablet Take 1 tablet (0.5 mg total) by mouth 2 (two) times  daily as needed for anxiety. 30 tablet 0   ascorbic acid (VITAMIN C) 500 MG tablet Take 500 mg by mouth daily.     atorvastatin  (LIPITOR) 20 MG tablet Take 1 tablet (20 mg total) by mouth daily. 90 tablet 3   levothyroxine  (SYNTHROID ) 100 MCG tablet Take 1 tablet (100 mcg total) by mouth daily. 90 tablet 3   Methylcobalamin (B12-ACTIVE PO) Take 1 tablet by mouth daily.     Multiple Vitamin (MULTIVITAMIN) tablet Take 1 tablet by mouth daily.     pantoprazole  (PROTONIX ) 40 MG tablet TAKE 1 TABLET(40 MG) BY MOUTH DAILY 90 tablet 3   VITAMIN D  PO Take 1 capsule by mouth daily.     zinc gluconate 50 MG tablet Take 50 mg by mouth daily.     No current facility-administered medications  on file prior to visit.   Allergies  Allergen Reactions   Macadamia Nut Oil Anaphylaxis   Other Swelling    POPCORN:  SWELLING THROAT   Peanut-Containing Drug Products Swelling and Other (See Comments)    THROAT TIGHTNESS; ALSO WALNUTS   Pistachio Nut (Diagnostic) Anaphylaxis   Tomato Swelling    SWELLING UVULA   Adhesive [Tape] Other (See Comments)    BLISTERS   Flour Other (See Comments)    SNEEZING   Penicillins Hives   Azithromycin  Swelling    Per pt lips swells up   Oxycodone      Unknown reaction   Sulfa Antibiotics Nausea Only   Vicodin [Hydrocodone -Acetaminophen ] Hives and Swelling    Pt had flushing, then itching and swelling of lips   Soap Other (See Comments)    FRAGRANCES (SOAP/LOTION/PERFUME):  HEADACHE, RUNNY NOSE   Tramadol  Nausea And Vomiting   Social History   Socioeconomic History   Marital status: Married    Spouse name: Lakeysa Fitting   Number of children: 4   Years of education: 12   Highest education level: 12th grade  Occupational History    Employer: [employer]    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[employer]  Tobacco Use   Smoking status: Former    Current packs/day: 0.00    Average packs/day: 0.5 packs/day for 8.0 years (4.0 ttl pk-yrs)    Types: Cigarettes    Start date: 03/15/1996    Quit date: 03/15/2004    Years since quitting: 19.3    Passive exposure: Past   Smokeless tobacco: Never  Vaping Use   Vaping status: Never Used  Substance and Sexual Activity   Alcohol use: No   Drug use: No    Comment: quit smoking 11 years ago   Sexual activity: Yes    Birth control/protection: Post-menopausal  Other Topics Concern   Not on file  Social History Narrative   1 daughter and 3 sons   5 grandchildren   Social Drivers of Corporate investment banker Strain: Low Risk  (02/15/2023)   Overall Financial Resource Strain (CARDIA)    Difficulty of Paying Living Expenses: Not hard at all  Food Insecurity: No Food Insecurity (02/15/2023)   Hunger  Vital Sign    Worried About Running Out of Food in the Last Year: Never true    Ran Out of Food in the Last Year: Never true  Transportation Needs: No Transportation Needs (02/15/2023)   PRAPARE - Administrator, Civil Service (Medical): No    Lack of Transportation (Non-Medical): No  Physical Activity: Inactive (02/15/2023)   Exercise Vital Sign    Days of Exercise  per Week: 0 days    Minutes of Exercise per Session: 0 min  Stress: No Stress Concern Present (02/15/2023)   [employer] of Occupational Health - Occupational Stress Questionnaire    Feeling of Stress : Not at all  Social Connections: Socially Integrated (02/15/2023)   Social Connection and Isolation Panel [NHANES]    Frequency of Communication with Friends and Family: More than three times a week    Frequency of Social Gatherings with Friends and Family: More than three times a week    Attends Religious Services: More than 4 times per year    Active Member of [employer] or Organizations: Yes    Attends Banker Meetings: More than 4 times per year    Marital Status: Married  Catering manager Violence: Not At Risk (02/15/2023)   Humiliation, Afraid, Rape, and Kick questionnaire    Fear of Current or Ex-Partner: No    Emotionally Abused: No    Physically Abused: No    Sexually Abused: No      Review of Systems     Objective:   Physical Exam Vitals reviewed.  Constitutional:      General: She is not in acute distress.    Appearance: She is obese. She is not toxic-appearing or diaphoretic.  HENT:     Head: Normocephalic.     Nose: No mucosal edema, congestion or rhinorrhea.     Right Sinus: No maxillary sinus tenderness or frontal sinus tenderness.     Left Sinus: No maxillary sinus tenderness or frontal sinus tenderness.     Mouth/Throat:     Mouth: Mucous membranes are moist.     Pharynx: Oropharynx is clear. No oropharyngeal exudate.  Eyes:     Extraocular Movements: Extraocular  movements intact.     Conjunctiva/sclera: Conjunctivae normal.     Pupils: Pupils are equal, round, and reactive to light.  Neck:     Thyroid : No thyroid  mass, thyromegaly or thyroid  tenderness.  Cardiovascular:     Rate and Rhythm: Regular rhythm. Tachycardia present.     Heart sounds: Normal heart sounds. No murmur heard.    No friction rub. No gallop.  Pulmonary:     Effort: Pulmonary effort is normal. No respiratory distress.     Breath sounds: Normal breath sounds. No stridor. No wheezing or rales.  Abdominal:     General: Abdomen is flat. Bowel sounds are normal. There is no distension.     Palpations: Abdomen is soft.     Tenderness: There is no abdominal tenderness. There is no guarding or rebound.  Musculoskeletal:     Cervical back: Full passive range of motion without pain, normal range of motion and neck supple. No edema or erythema. No pain with movement or muscular tenderness. Normal range of motion.     Right lower leg: Edema present.     Left lower leg: Edema present.  Lymphadenopathy:     Cervical: No cervical adenopathy.  Skin:    Findings: No rash.  Neurological:     General: No focal deficit present.     Mental Status: She is alert and oriented to person, place, and time. Mental status is at baseline.     Cranial Nerves: No cranial nerve deficit.     Motor: No weakness.     Coordination: Coordination normal.     Gait: Gait normal.  Psychiatric:        Mood and Affect: Mood normal.        Behavior:  Behavior normal.        Thought Content: Thought content normal.        Judgment: Judgment normal.           Assessment & Plan:  Leg swelling - Plan: CBC with Differential/Platelet, COMPLETE METABOLIC PANEL WITHOUT GFR, Brain natriuretic peptide Patient has significant leg swelling.  She also has numerous varicose veins on the leg.  She has been told that she has cirrhosis secondary to fatty liver disease.  I am concerned that the fluid retention could be due  to cirrhosis although I cannot exclude congestive heart failure.  Start by checking a BNP.  Also check a CMP and a CBC evaluate for evidence of cirrhosis such as low albumin, elevated bilirubin, etc.  BNP is elevated, I would recommend checking an echocardiogram to determine the cause and to help direct treatment.  If BNP is normal, I would recommend obtaining a FibroScan to evaluate for the presence of cirrhosis and depending upon the level, institute therapy with spironolactone and Lasix .  Also recommend aggressive weight loss and consider Wegovy.  Meanwhile start Lasix  20 mg a day while awaiting lab work

## 2023-07-15 ENCOUNTER — Other Ambulatory Visit: Payer: Self-pay | Admitting: Family Medicine

## 2023-07-15 DIAGNOSIS — K7581 Nonalcoholic steatohepatitis (NASH): Secondary | ICD-10-CM

## 2023-07-15 LAB — BRAIN NATRIURETIC PEPTIDE: Brain Natriuretic Peptide: 4 pg/mL (ref <100)

## 2023-07-15 LAB — COMPLETE METABOLIC PANEL WITHOUT GFR
AG Ratio: 1.5 (calc) (ref 1.0–2.5)
ALT: 45 U/L — ABNORMAL HIGH (ref 6–29)
AST: 59 U/L — ABNORMAL HIGH (ref 10–35)
Albumin: 4.5 g/dL (ref 3.6–5.1)
Alkaline phosphatase (APISO): 95 U/L (ref 37–153)
BUN/Creatinine Ratio: 10 (calc) (ref 6–22)
BUN: 11 mg/dL (ref 7–25)
CO2: 23 mmol/L (ref 20–32)
Calcium: 9.6 mg/dL (ref 8.6–10.4)
Chloride: 105 mmol/L (ref 98–110)
Creat: 1.07 mg/dL — ABNORMAL HIGH (ref 0.50–1.05)
Globulin: 3.1 g/dL (ref 1.9–3.7)
Glucose, Bld: 92 mg/dL (ref 65–99)
Potassium: 3.9 mmol/L (ref 3.5–5.3)
Sodium: 139 mmol/L (ref 135–146)
Total Bilirubin: 0.7 mg/dL (ref 0.2–1.2)
Total Protein: 7.6 g/dL (ref 6.1–8.1)

## 2023-07-15 LAB — CBC WITH DIFFERENTIAL/PLATELET
Absolute Lymphocytes: 2072 {cells}/uL (ref 850–3900)
Absolute Monocytes: 736 {cells}/uL (ref 200–950)
Basophils Absolute: 56 {cells}/uL (ref 0–200)
Basophils Relative: 0.7 %
Eosinophils Absolute: 200 {cells}/uL (ref 15–500)
Eosinophils Relative: 2.5 %
HCT: 43.4 % (ref 35.0–45.0)
Hemoglobin: 14.4 g/dL (ref 11.7–15.5)
MCH: 31.9 pg (ref 27.0–33.0)
MCHC: 33.2 g/dL (ref 32.0–36.0)
MCV: 96.2 fL (ref 80.0–100.0)
MPV: 11.6 fL (ref 7.5–12.5)
Monocytes Relative: 9.2 %
Neutro Abs: 4936 {cells}/uL (ref 1500–7800)
Neutrophils Relative %: 61.7 %
Platelets: 166 10*3/uL (ref 140–400)
RBC: 4.51 10*6/uL (ref 3.80–5.10)
RDW: 12.6 % (ref 11.0–15.0)
Total Lymphocyte: 25.9 %
WBC: 8 10*3/uL (ref 3.8–10.8)

## 2023-07-17 ENCOUNTER — Encounter: Payer: Self-pay | Admitting: Pediatrics

## 2023-07-19 ENCOUNTER — Ambulatory Visit (HOSPITAL_COMMUNITY)
Admission: RE | Admit: 2023-07-19 | Discharge: 2023-07-19 | Disposition: A | Discharge status: Home / Self Care | Source: Ambulatory Visit | Attending: Family Medicine | Admitting: Family Medicine

## 2023-07-19 ENCOUNTER — Ambulatory Visit (HOSPITAL_COMMUNITY)
Admission: RE | Admit: 2023-07-19 | Discharge: 2023-07-19 | Disposition: A | Discharge status: Home / Self Care | Source: Ambulatory Visit | Attending: Pediatrics | Admitting: Pediatrics

## 2023-07-19 ENCOUNTER — Encounter: Payer: Self-pay | Admitting: Pediatrics

## 2023-07-19 DIAGNOSIS — K7581 Nonalcoholic steatohepatitis (NASH): Secondary | ICD-10-CM

## 2023-07-19 DIAGNOSIS — K769 Liver disease, unspecified: Secondary | ICD-10-CM | POA: Diagnosis not present

## 2023-07-19 DIAGNOSIS — K7460 Unspecified cirrhosis of liver: Secondary | ICD-10-CM | POA: Diagnosis not present

## 2023-07-19 DIAGNOSIS — K709 Alcoholic liver disease, unspecified: Secondary | ICD-10-CM | POA: Diagnosis not present

## 2023-07-19 DIAGNOSIS — K759 Inflammatory liver disease, unspecified: Secondary | ICD-10-CM | POA: Diagnosis not present

## 2023-07-19 DIAGNOSIS — K8020 Calculus of gallbladder without cholecystitis without obstruction: Secondary | ICD-10-CM | POA: Diagnosis not present

## 2023-07-19 DIAGNOSIS — K7689 Other specified diseases of liver: Secondary | ICD-10-CM | POA: Diagnosis not present

## 2023-07-26 ENCOUNTER — Other Ambulatory Visit: Payer: Self-pay | Admitting: Family Medicine

## 2023-07-28 NOTE — Telephone Encounter (Signed)
 Requested Prescriptions  Pending Prescriptions Disp Refills   pantoprazole  (PROTONIX ) 40 MG tablet [Pharmacy Med Name: PANTOPRAZOLE  40MG  TABLETS] 90 tablet 1    Sig: TAKE 1 TABLET(40 MG) BY MOUTH DAILY     Gastroenterology: Proton Pump Inhibitors Passed - 07/28/2023  8:56 AM      Passed - Valid encounter within last 12 months    Recent Outpatient Visits           2 weeks ago Leg swelling   [place] [facility] Pickard, Cisco Crest, MD   2 months ago Age related osteoporosis, unspecified pathological fracture presence   [place] [facility] Cheril Cork, Cisco Crest, MD   6 months ago Pure hypercholesterolemia   [place] [facility] Cheril Cork, Cisco Crest, MD   8 months ago Otitis externa of right ear, unspecified chronicity, unspecified type   [place] [facility] Jenelle Mis, FNP   9 months ago Age related osteoporosis, unspecified pathological fracture presence   [place] [facility] Pickard, Cisco Crest, MD               levothyroxine  (SYNTHROID ) 100 MCG tablet [Pharmacy Med Name: LEVOTHYROXINE  0.100MG  ( ) TAB] 90 tablet 1    Sig: TAKE 1 TABLET(100 MCG) BY MOUTH DAILY     Endocrinology:  Hypothyroid Agents Passed - 07/28/2023  8:56 AM      Passed - TSH in normal range and within 360 days    TSH  Date Value Ref Range Status  06/01/2023 1.27 0.40 - 4.50 mIU/L Final         Passed - Valid encounter within last 12 months    Recent Outpatient Visits           2 weeks ago Leg swelling   [place] [facility] Austine Lefort, MD   2 months ago Age related osteoporosis, unspecified pathological fracture presence   [place] [facility] Cheril Cork, Cisco Crest, MD   6 months ago Pure hypercholesterolemia   [place] [facility] Cheril Cork, Cisco Crest, MD   8 months ago Otitis externa of right ear, unspecified chronicity, unspecified type    [place] [facility] Jenelle Mis, FNP   9 months ago Age related osteoporosis, unspecified pathological fracture presence   [place] [facility] Pickard, Cisco Crest, MD

## 2023-08-17 ENCOUNTER — Ambulatory Visit (INDEPENDENT_AMBULATORY_CARE_PROVIDER_SITE_OTHER): Admitting: Family Medicine

## 2023-08-17 ENCOUNTER — Encounter: Payer: Self-pay | Admitting: Family Medicine

## 2023-08-17 VITALS — BP 124/82 | HR 83 | Temp 98.4°F | Ht 64.0 in | Wt 245.1 lb

## 2023-08-17 DIAGNOSIS — J0100 Acute maxillary sinusitis, unspecified: Secondary | ICD-10-CM | POA: Diagnosis not present

## 2023-08-17 DIAGNOSIS — H1032 Unspecified acute conjunctivitis, left eye: Secondary | ICD-10-CM

## 2023-08-17 DIAGNOSIS — J029 Acute pharyngitis, unspecified: Secondary | ICD-10-CM

## 2023-08-17 DIAGNOSIS — R0982 Postnasal drip: Secondary | ICD-10-CM | POA: Diagnosis not present

## 2023-08-17 MED ORDER — MOXIFLOXACIN HCL 0.5 % OP SOLN: 1.0000 [drp] | Freq: Three times a day (TID) | OPHTHALMIC | 1 refills | Status: DC

## 2023-08-17 MED ORDER — DOXYCYCLINE HYCLATE 100 MG PO TABS: 100.0000 mg | ORAL_TABLET | Freq: Two times a day (BID) | ORAL | 0 refills | Status: AC

## 2023-08-17 MED ORDER — PREDNISONE 10 MG (21) PO TBPK: ORAL_TABLET | ORAL | 0 refills | Status: DC

## 2023-08-17 NOTE — Progress Notes (Signed)
 Patient Office Visit  Assessment & Plan:  Acute conjunctivitis of left eye, unspecified acute conjunctivitis type -     Moxifloxacin HCl; Place 1 drop into the left eye 3 (three) times daily.  Dispense: 3 mL; Refill: 1  Postnasal drip -     predniSONE ; Use as directed.  Dispense: 21 each; Refill: 0  Acute non-recurrent maxillary sinusitis -     Doxycycline  Hyclate; Take 1 tablet (100 mg total) by mouth 2 (two) times daily for 7 days.  Dispense: 14 tablet; Refill: 0 -     predniSONE ; Use as directed.  Dispense: 21 each; Refill: 0  Sore throat -     STREP GROUP A AG, W/REFLEX TO CULT -     predniSONE ; Use as directed.  Dispense: 21 each; Refill: 0  Other orders -     Culture, Group A Strep   Assessment and Plan    Sinusitis Symptoms consistent with sinusitis, including facial pain, jaw pain, and post-nasal drip. Symptoms worsened yesterday. Allergic to penicillin and azithromycin . - Prescribed doxycycline . - Performed strep test to rule out streptococcal infection.  Conjunctivitis Acute conjunctivitis in the left eye with swelling and drainage. Prefers eye drops over ointment. - Prescribed eye drops three times daily for one week. - Advised warm washcloths in the morning to clean the eye.      Patient will start the prednisone  if she is not improved in the next few days.  Patient will continue over-the-counter Flonase.  She will let us  know if not improving. Strep negative- patient aware  Return if symptoms worsen or fail to improve.   Subjective:     Patient ID: Amanda Hoover, female    DOB: May 11, 1955  Age: 68 y.o. MRN: [id number]  Chief Complaint  Patient presents with   Facial Pain    Jaw pain, headache, facial pain x 2 days. Pt states that her throat is raw from drainage.    Eye Drainage    Left eye drainage since Sunday.     HPI Discussed the use of AI scribe software for clinical note transcription with the patient, who gave verbal consent to  proceed.  History of Present Illness         History of Present Illness Amanda Hoover is a 68 year old female who presents with left eye pain, swelling, and drainage.  Symptoms began on Sunday with pain in the left eye, followed by swelling and drainage. She used an old eye ointment, possibly Neosporin, which provided some relief. The eye was significantly swollen, and she experienced discharge in the eye area. She has been using warm compresses and notes that her eye was not matted shut.  She is experiencing significant sleep disturbances, unable to sleep more than twenty minutes at a time due to a raw throat, which she attributes to drainage. She has been gargling with warm salt water and using an over-the-counter antihistamine decongestant every four hours, as she finds the twelve-hour formulations less effective.  She reports facial pain, particularly on the right side, which started extremely bad the day before yesterday. The pain is described as a toothache affecting her whole jaw and face. Patient has also used OTC flonase. patient may grind or clench her jaw but not sure. She has been taking ibuprofen  for relief but is cautious due to previous rectal bleeding associated with its use. She denies any recent dental issues but mentions a past dental implant which has not been problematic  She has  a history of multiple allergies, including hives with penicillin, lip swelling with azithromycin , and nausea with sulfa drugs. She recalls taking clindamycin in the past without issues. She has been exposed to many people recently, including at her granddaughter's dance recital and graduation party, and mentions a sick relative from [place]  who hugged her. Patient did COVID test at home which was negative. Patient has never had strep before.   No shortness of breath or wheezing. She is unsure if she has been exposed to strep but notes her throat is very sore and feels like 'razor blades'. She has a  history of breast cancer in 2013 and a family history of breast cancer on her paternal side, with her grandmother and her father's twin sister having had the disease.  Physical Exam HEENT: Throat with drainage, appears irritated.  Results Procedure: Throat Swab Description: Throat swabbed to test for Streptococcus.  Assessment and Plan Sinusitis Symptoms consistent with sinusitis, including facial pain, jaw pain, and post-nasal drip. Symptoms worsened yesterday. Allergic to penicillin and azithromycin . - Prescribed doxycycline . - Performed strep test to rule out streptococcal infection.  Conjunctivitis Acute conjunctivitis in the left eye with swelling and drainage. Prefers eye drops over ointment. - Prescribed eye drops three times daily for one week. - Advised warm washcloths in the morning to clean the eye.    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   Breast cancer, right (HCC)    ER 0 PR0 LN neg.   Chest pain    a. false positive NST in 10/2020 with cath in 11/2020 showing only minimal CAD with 10% mid-LAD stenosis.   Chronic headaches    Chronic ITP (idiopathic thrombocytopenia) (HCC)    mild thrombocytopenia, normal spleen on us  11/18, mild cirrhosis   Cirrhosis (HCC)    Dental crowns present    Depression    Fatty liver    GERD (gastroesophageal reflux disease)    Heart murmur    Hiatal hernia    History of colon polyps    History of esophageal stricture    Status post esophageal dilatation   History of radiation therapy 03/28/2012-05/17/2012   60.4 gray to right breast   Hyperlipidemia    Hypothyroid    Multiple environmental allergies    Multiple food allergies    Osteopenia    Osteoporosis    Palpitations    Personal history of radiation therapy 2013    Right Breast Cancer   Rash 01/06/2012   right elbow   Rhinitis, allergic    rhinitis   Past Surgical History:  Procedure Laterality Date   BREAST LUMPECTOMY Right 2013   COLONOSCOPY     COLONOSCOPY W/ POLYPECTOMY     ESOPHAGOGASTRODUODENOSCOPY     LEFT HEART CATH AND CORONARY ANGIOGRAPHY N/A 11/19/2020   Procedure: LEFT HEART CATH AND CORONARY ANGIOGRAPHY;  Surgeon: Millicent Ally, MD;  Location: [facility];  Service: Cardiovascular;  Laterality: N/A;   MASTECTOMY Right 2013   partial mast   PARTIAL MASTECTOMY WITH NEEDLE LOCALIZATION AND AXILLARY SENTINEL LYMPH NODE BX  01/12/2012   Procedure: PARTIAL MASTECTOMY WITH NEEDLE LOCALIZATION AND AXILLARY SENTINEL LYMPH NODE BX;  Surgeon: Levert Ready, MD;  Location: [facility];  Service: General;  Laterality: Right;  RE-EXCISION OF BREAST CANCER,SUPERIOR MARGINS  02/15/2012   Procedure: RE-EXCISION OF BREAST CANCER,SUPERIOR MARGINS;  Surgeon: Levert Ready, MD;  Location: [facility];  Service: General;  Laterality: Right;  Re-excision of right lumpectomy, close posterior margin.   Social History   Tobacco Use   Smoking status: Former    Current packs/day: 0.00    Average packs/day: 0.5 packs/day for 8.0 years (4.0 ttl pk-yrs)    Types: Cigarettes    Start date: 03/15/1996    Quit date: 03/15/2004    Years since quitting: 19.4    Passive exposure: Past   Smokeless tobacco: Never  Vaping Use   Vaping status: Never Used  Substance Use Topics   Alcohol use: No   Drug use: No    Comment: quit smoking 11 years ago   Family History  Problem Relation Age of Onset   Crohn's disease Mother    Irritable bowel syndrome Mother    Hypertension Mother    Hyperlipidemia Mother    Stroke Mother    Thyroid  disease Mother    Depression Mother    Esophageal cancer Father 67   Cancer Father    Cancer Maternal Uncle        unknown form of cancer   Breast cancer Paternal Aunt 40   Diabetes  Maternal Grandmother    Breast cancer Paternal Grandmother 23   Diabetes Paternal Grandmother    Cancer Paternal Grandmother    Melanoma Cousin        maternal cousin dx in her 58s   Breast cancer Other    Liver disease Other    Colon cancer Neg Hx    Rectal cancer Neg Hx    Stomach cancer Neg Hx    Colon polyps Neg Hx    Allergies  Allergen Reactions   Macadamia Nut Oil Anaphylaxis   Other Swelling    POPCORN:  SWELLING THROAT   Peanut-Containing Drug Products Swelling and Other (See Comments)    THROAT TIGHTNESS; ALSO WALNUTS   Pistachio Nut (Diagnostic) Anaphylaxis   Tomato Swelling    SWELLING UVULA   Adhesive [Tape] Other (See Comments)    BLISTERS   Flour Other (See Comments)    SNEEZING   Penicillins Hives   Azithromycin  Swelling    Per pt lips swells up   Oxycodone      Unknown reaction   Sulfa Antibiotics Nausea Only   Vicodin [Hydrocodone -Acetaminophen ] Hives and Swelling    Pt had flushing, then itching and swelling of lips   Soap Other (See Comments)    FRAGRANCES (SOAP/LOTION/PERFUME):  HEADACHE, RUNNY NOSE   Tramadol  Nausea And Vomiting    ROS    Objective:    BP 124/82   Pulse 83   Temp 98.4 F (36.9 C)   Ht 5\' 4"  (1.626 m)   Wt 245 lb 2 oz (111.2 kg)   SpO2 99%   BMI 42.08 kg/m  BP Readings from Last 3 Encounters:  08/17/23 124/82  07/14/23 124/72  07/12/23 120/78   Wt Readings from Last 3 Encounters:  08/17/23 245 lb 2 oz (111.2 kg)  07/14/23 248 lb 12.8 oz (112.9 kg)  07/12/23 248 lb (112.5 kg)    Physical Exam Vitals and nursing note reviewed.  Constitutional:      Appearance: Normal appearance.  HENT:     Head: Normocephalic.     Right Ear: Tympanic membrane, ear canal and external ear normal.     Left Ear: Tympanic membrane, ear canal  and external ear normal.  Eyes:     Extraocular Movements: Extraocular movements intact.     Conjunctiva/sclera: Conjunctivae normal.     Pupils: Pupils are equal, round, and reactive to  light.  Cardiovascular:     Rate and Rhythm: Normal rate and regular rhythm.     Heart sounds: Normal heart sounds.  Pulmonary:     Effort: Pulmonary effort is normal.     Breath sounds: Normal breath sounds.  Musculoskeletal:     Right lower leg: No edema.     Left lower leg: No edema.  Neurological:     General: No focal deficit present.     Mental Status: She is alert and oriented to person, place, and time.  Psychiatric:        Mood and Affect: Mood normal.        Behavior: Behavior normal.        Thought Content: Thought content normal.        Judgment: Judgment normal.      Results for orders placed or performed in visit on 08/17/23  STREP GROUP A AG, W/REFLEX TO CULT   Specimen: Throat  Result Value Ref Range   Streptococcus Group A AG NOT DETECTED NOT DETECTED

## 2023-08-20 LAB — CULTURE, GROUP A STREP
Micro Number: 16537711
SPECIMEN QUALITY:: ADEQUATE

## 2023-08-20 LAB — STREP GROUP A AG, W/REFLEX TO CULT: Streptococcus Group A AG: NOT DETECTED

## 2023-08-22 ENCOUNTER — Ambulatory Visit: Payer: Self-pay | Admitting: Family Medicine

## 2023-08-22 DIAGNOSIS — L570 Actinic keratosis: Secondary | ICD-10-CM | POA: Diagnosis not present

## 2023-08-22 DIAGNOSIS — L920 Granuloma annulare: Secondary | ICD-10-CM | POA: Diagnosis not present

## 2023-08-22 DIAGNOSIS — L905 Scar conditions and fibrosis of skin: Secondary | ICD-10-CM | POA: Diagnosis not present

## 2023-08-22 DIAGNOSIS — X32XXXD Exposure to sunlight, subsequent encounter: Secondary | ICD-10-CM | POA: Diagnosis not present

## 2023-08-26 ENCOUNTER — Ambulatory Visit (INDEPENDENT_AMBULATORY_CARE_PROVIDER_SITE_OTHER): Admitting: Family Medicine

## 2023-08-26 VITALS — BP 138/78 | HR 80 | Temp 98.3°F | Resp 18 | Wt 238.0 lb

## 2023-08-26 DIAGNOSIS — J069 Acute upper respiratory infection, unspecified: Secondary | ICD-10-CM | POA: Diagnosis not present

## 2023-08-26 MED ORDER — HYDROCOD POLI-CHLORPHE POLI ER 10-8 MG/5ML PO SUER: 5.0000 mL | Freq: Two times a day (BID) | ORAL | 0 refills | Status: DC | PRN

## 2023-08-26 NOTE — Progress Notes (Signed)
 Due to the length thank coverage you were tight  Subjective:    Patient ID: Amanda Hoover, female    DOB: 23-May-1955, 68 y.o.   MRN: [id number]  HPI   Patient reports a 2-week history of cough, postnasal drip, head congestion.  She is coughing constantly during our encounter.  She was seen by my partner who recently gave her doxycycline  and prednisone .  She states that she is feeling better.  The cough is no longer productive of purulent sputum.  She denies any chest pain or shortness of breath or dyspnea on exertion.  She continues to have an irritant cough and drainage.  She denies any headache or sinus pain.  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   Breast cancer, right (HCC)    ER 0 PR0 LN neg.   Chest pain    a. false positive NST in 10/2020 with cath in 11/2020 showing only minimal CAD with 10% mid-LAD stenosis.   Chronic headaches    Chronic ITP (idiopathic thrombocytopenia) (HCC)    mild thrombocytopenia, normal spleen on us  11/18, mild cirrhosis   Cirrhosis (HCC)    Dental crowns present    Depression    Fatty liver    GERD (gastroesophageal reflux disease)    Heart murmur    Hiatal hernia    History of colon polyps    History of esophageal stricture    Status post esophageal dilatation   History of radiation therapy 03/28/2012-05/17/2012   60.4 gray to right breast   Hyperlipidemia    Hypothyroid    Multiple environmental allergies    Multiple food allergies    Osteopenia    Osteoporosis    Palpitations    Personal history of radiation therapy 2013   Right Breast Cancer   Rash 01/06/2012   right elbow   Rhinitis, allergic    rhinitis   Past Surgical History:  Procedure Laterality Date   BREAST LUMPECTOMY Right 2013   COLONOSCOPY     COLONOSCOPY W/ POLYPECTOMY     ESOPHAGOGASTRODUODENOSCOPY     LEFT HEART CATH AND  CORONARY ANGIOGRAPHY N/A 11/19/2020   Procedure: LEFT HEART CATH AND CORONARY ANGIOGRAPHY;  Surgeon: Millicent Ally, MD;  Location: [facility];  Service: Cardiovascular;  Laterality: N/A;   MASTECTOMY Right 2013   partial mast   PARTIAL MASTECTOMY WITH NEEDLE LOCALIZATION AND AXILLARY SENTINEL LYMPH NODE BX  01/12/2012   Procedure: PARTIAL MASTECTOMY WITH NEEDLE LOCALIZATION AND AXILLARY SENTINEL LYMPH NODE BX;  Surgeon: Levert Ready, MD;  Location: [facility];  Service: General;  Laterality: Right;   RE-EXCISION OF BREAST CANCER,SUPERIOR MARGINS  02/15/2012   Procedure: RE-EXCISION OF BREAST CANCER,SUPERIOR MARGINS;  Surgeon: Levert Ready, MD;  Location: [facility];  Service: General;  Laterality: Right;  Re-excision of right lumpectomy, close posterior margin.   Current Outpatient Medications on File Prior to Visit  Medication Sig Dispense Refill   alendronate  (FOSAMAX ) 70 MG tablet Take 1 tablet (70 mg total) by mouth every 7 (seven) days. Take with a full glass of water on an empty stomach. 4 tablet 11   ALPRAZolam  (XANAX ) 0.5 MG tablet Take 1 tablet (0.5 mg total) by mouth 2 (two) times daily as needed for anxiety. 30 tablet 0   ascorbic acid (VITAMIN C) 500 MG tablet  Take 500 mg by mouth daily.     atorvastatin  (LIPITOR) 20 MG tablet Take 1 tablet (20 mg total) by mouth daily. 90 tablet 3   CALCIUM -VITAMIN D  PO Take by mouth.     furosemide  (LASIX ) 20 MG tablet Take 1 tablet (20 mg total) by mouth daily. 30 tablet 3   levothyroxine  (SYNTHROID ) 100 MCG tablet TAKE 1 TABLET(100 MCG) BY MOUTH DAILY 90 tablet 1   Methylcobalamin (B12-ACTIVE PO) Take 1 tablet by mouth daily.     MILK THISTLE PO Take by mouth.     moxifloxacin  (VIGAMOX ) 0.5 % ophthalmic solution Place 1 drop into the left eye 3 (three) times daily. 3 mL 1   Multiple Vitamin (MULTIVITAMIN) tablet Take 1 tablet by mouth daily.     pantoprazole  (PROTONIX ) 40 MG tablet TAKE 1 TABLET(40  MG) BY MOUTH DAILY 90 tablet 1   predniSONE  (STERAPRED UNI-PAK 21 TAB) 10 MG (21) TBPK tablet Use as directed. 21 each 0   VITAMIN D  PO Take 1 capsule by mouth daily.     zinc gluconate 50 MG tablet Take 50 mg by mouth daily. (Patient not taking: Reported on 08/17/2023)     No current facility-administered medications on file prior to visit.   Allergies  Allergen Reactions   Macadamia Nut Oil Anaphylaxis   Other Swelling    POPCORN:  SWELLING THROAT   Peanut-Containing Drug Products Swelling and Other (See Comments)    THROAT TIGHTNESS; ALSO WALNUTS   Pistachio Nut (Diagnostic) Anaphylaxis   Tomato Swelling    SWELLING UVULA   Adhesive [Tape] Other (See Comments)    BLISTERS   Flour Other (See Comments)    SNEEZING   Penicillins Hives   Azithromycin  Swelling    Per pt lips swells up   Oxycodone      Unknown reaction   Sulfa Antibiotics Nausea Only   Vicodin [Hydrocodone -Acetaminophen ] Hives and Swelling    Pt had flushing, then itching and swelling of lips   Soap Other (See Comments)    FRAGRANCES (SOAP/LOTION/PERFUME):  HEADACHE, RUNNY NOSE   Tramadol  Nausea And Vomiting   Social History   Socioeconomic History   Marital status: Married    Spouse name: Nikea Settle   Number of children: 4   Years of education: 12   Highest education level: 12th grade  Occupational History    Employer: [employer]    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[employer]  Tobacco Use   Smoking status: Former    Current packs/day: 0.00    Average packs/day: 0.5 packs/day for 8.0 years (4.0 ttl pk-yrs)    Types: Cigarettes    Start date: 03/15/1996    Quit date: 03/15/2004    Years since quitting: 19.4    Passive exposure: Past   Smokeless tobacco: Never  Vaping Use   Vaping status: Never Used  Substance and Sexual Activity   Alcohol use: No   Drug use: No    Comment: quit smoking 11 years ago   Sexual activity: Yes    Birth control/protection: Post-menopausal  Other Topics Concern   Not on  file  Social History Narrative   1 daughter and 3 sons   5 grandchildren   Social Drivers of Corporate investment banker Strain: Low Risk  (02/15/2023)   Overall Financial Resource Strain (CARDIA)    Difficulty of Paying Living Expenses: Not hard at all  Food Insecurity: No Food Insecurity (02/15/2023)   Hunger Vital Sign    Worried About Running  Out of Food in the Last Year: Never true    Ran Out of Food in the Last Year: Never true  Transportation Needs: No Transportation Needs (02/15/2023)   PRAPARE - Administrator, Civil Service (Medical): No    Lack of Transportation (Non-Medical): No  Physical Activity: Inactive (02/15/2023)   Exercise Vital Sign    Days of Exercise per Week: 0 days    Minutes of Exercise per Session: 0 min  Stress: No Stress Concern Present (02/15/2023)   [employer] of Occupational Health - Occupational Stress Questionnaire    Feeling of Stress : Not at all  Social Connections: Socially Integrated (02/15/2023)   Social Connection and Isolation Panel    Frequency of Communication with Friends and Family: More than three times a week    Frequency of Social Gatherings with Friends and Family: More than three times a week    Attends Religious Services: More than 4 times per year    Active Member of [employer] or Organizations: Yes    Attends Banker Meetings: More than 4 times per year    Marital Status: Married  Catering manager Violence: Not At Risk (02/15/2023)   Humiliation, Afraid, Rape, and Kick questionnaire    Fear of Current or Ex-Partner: No    Emotionally Abused: No    Physically Abused: No    Sexually Abused: No      Review of Systems     Objective:   Physical Exam Vitals reviewed.  Constitutional:      General: She is not in acute distress.    Appearance: She is obese. She is not toxic-appearing or diaphoretic.  HENT:     Head: Normocephalic.     Right Ear: Tympanic membrane and ear canal normal.     Left Ear:  Tympanic membrane and ear canal normal.     Nose: Congestion present. No mucosal edema or rhinorrhea.     Right Sinus: No maxillary sinus tenderness or frontal sinus tenderness.     Left Sinus: No maxillary sinus tenderness or frontal sinus tenderness.     Mouth/Throat:     Mouth: Mucous membranes are moist.     Pharynx: Oropharynx is clear. No oropharyngeal exudate.   Eyes:     Extraocular Movements: Extraocular movements intact.     Conjunctiva/sclera: Conjunctivae normal.     Pupils: Pupils are equal, round, and reactive to light.   Neck:     Thyroid : No thyroid  mass, thyromegaly or thyroid  tenderness.   Cardiovascular:     Rate and Rhythm: Regular rhythm. Tachycardia present.     Heart sounds: Normal heart sounds. No murmur heard.    No friction rub. No gallop.  Pulmonary:     Effort: Pulmonary effort is normal. No respiratory distress.     Breath sounds: Normal breath sounds. No stridor. No wheezing or rales.  Abdominal:     General: Abdomen is flat. Bowel sounds are normal. There is no distension.     Palpations: Abdomen is soft.     Tenderness: There is no abdominal tenderness. There is no guarding or rebound.   Musculoskeletal:     Cervical back: Full passive range of motion without pain, normal range of motion and neck supple. No edema or erythema. No pain with movement or muscular tenderness. Normal range of motion.     Right lower leg: Edema present.     Left lower leg: Edema present.  Lymphadenopathy:     Cervical: No cervical  adenopathy.   Skin:    Findings: No rash.   Neurological:     General: No focal deficit present.     Mental Status: She is alert and oriented to person, place, and time. Mental status is at baseline.     Cranial Nerves: No cranial nerve deficit.     Motor: No weakness.     Coordination: Coordination normal.     Gait: Gait normal.   Psychiatric:        Mood and Affect: Mood normal.        Behavior: Behavior normal.        Thought  Content: Thought content normal.        Judgment: Judgment normal.           Assessment & Plan:  Viral upper respiratory tract infection Patient appears to be recovering from a viral upper respiratory infection.  She can use Tussionex 1 teaspoon every 12 hours as needed for cough.  Patient has not had a true allergic reaction to hydrocodone .  I feel that she can safely take this cough medicine.  Continue to use decongestants as needed for head congestion.

## 2023-10-10 DIAGNOSIS — H25813 Combined forms of age-related cataract, bilateral: Secondary | ICD-10-CM | POA: Diagnosis not present

## 2023-10-10 DIAGNOSIS — H01001 Unspecified blepharitis right upper eyelid: Secondary | ICD-10-CM | POA: Diagnosis not present

## 2023-10-10 DIAGNOSIS — H01004 Unspecified blepharitis left upper eyelid: Secondary | ICD-10-CM | POA: Diagnosis not present

## 2023-10-10 DIAGNOSIS — H524 Presbyopia: Secondary | ICD-10-CM | POA: Diagnosis not present

## 2023-10-10 DIAGNOSIS — B880 Other acariasis: Secondary | ICD-10-CM | POA: Diagnosis not present

## 2023-10-25 ENCOUNTER — Ambulatory Visit: Admitting: Family Medicine

## 2023-11-22 ENCOUNTER — Ambulatory Visit (INDEPENDENT_AMBULATORY_CARE_PROVIDER_SITE_OTHER): Admitting: Family Medicine

## 2023-11-22 ENCOUNTER — Encounter: Payer: Self-pay | Admitting: Family Medicine

## 2023-11-22 VITALS — BP 124/72 | HR 77 | Temp 98.1°F | Ht 64.0 in | Wt 246.6 lb

## 2023-11-22 DIAGNOSIS — E038 Other specified hypothyroidism: Secondary | ICD-10-CM | POA: Diagnosis not present

## 2023-11-22 DIAGNOSIS — K7581 Nonalcoholic steatohepatitis (NASH): Secondary | ICD-10-CM | POA: Diagnosis not present

## 2023-11-22 DIAGNOSIS — E7800 Pure hypercholesterolemia, unspecified: Secondary | ICD-10-CM

## 2023-11-22 MED ORDER — PHENTERMINE HCL 37.5 MG PO TABS: 37.5000 mg | ORAL_TABLET | Freq: Every day | ORAL | 2 refills | Status: AC

## 2023-11-22 NOTE — Progress Notes (Signed)
 Subjective:    Patient ID: Amanda Hoover, female    DOB: [birth year], 68 y.o.   MRN: [id number]  HPI  Due to recheck thyroid  and cholesterol.  Unable to lose weight.  Has NASH.   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   Breast cancer, right (HCC)    ER 0 PR0 LN neg.   Chest pain    a. false positive NST in 10/2020 with cath in 11/2020 showing only minimal CAD with 10% mid-LAD stenosis.   Chronic headaches    Chronic ITP (idiopathic thrombocytopenia) (HCC)    mild thrombocytopenia, normal spleen on us  11/18, mild cirrhosis   Cirrhosis (HCC)    Dental crowns present    Depression    Fatty liver    GERD (gastroesophageal reflux disease)    Heart murmur    Hiatal hernia    History of colon polyps    History of esophageal stricture    Status post esophageal dilatation   History of radiation therapy 03/28/2012-05/17/2012   60.4 gray to right breast   Hyperlipidemia    Hypothyroid    Multiple environmental allergies    Multiple food allergies    Osteopenia    Osteoporosis    Palpitations    Personal history of radiation therapy 2013   Right Breast Cancer   Rash 01/06/2012   right elbow   Rhinitis, allergic    rhinitis   Past Surgical History:  Procedure Laterality Date   BREAST LUMPECTOMY Right 2013   COLONOSCOPY     COLONOSCOPY W/ POLYPECTOMY     ESOPHAGOGASTRODUODENOSCOPY     LEFT HEART CATH AND CORONARY ANGIOGRAPHY N/A 11/19/2020   Procedure: LEFT HEART CATH AND CORONARY ANGIOGRAPHY;  Surgeon: Burnard Debby LABOR, MD;  Location: [facility];  Service: Cardiovascular;  Laterality: N/A;   MASTECTOMY Right 2013   partial mast   PARTIAL  MASTECTOMY WITH NEEDLE LOCALIZATION AND AXILLARY SENTINEL LYMPH NODE BX  01/12/2012   Procedure: PARTIAL MASTECTOMY WITH NEEDLE LOCALIZATION AND AXILLARY SENTINEL LYMPH NODE BX;  Surgeon: Elon CHRISTELLA Pacini, MD;  Location: [facility];  Service: General;  Laterality: Right;   RE-EXCISION OF BREAST CANCER,SUPERIOR MARGINS  02/15/2012   Procedure: RE-EXCISION OF BREAST CANCER,SUPERIOR MARGINS;  Surgeon: Elon CHRISTELLA Pacini, MD;  Location: [facility];  Service: General;  Laterality: Right;  Re-excision of right lumpectomy, close posterior margin.   Current Outpatient Medications on File Prior to Visit  Medication Sig Dispense Refill   alendronate  (FOSAMAX ) 70 MG tablet Take 1 tablet (70 mg total) by mouth every 7 (seven) days. Take with a full glass of water on an empty stomach. 4 tablet 11   ALPRAZolam  (XANAX ) 0.5 MG tablet Take 1 tablet (0.5 mg total) by mouth 2 (two) times daily as needed for anxiety. 30 tablet 0   ascorbic acid (VITAMIN C) 500 MG tablet Take 500 mg by mouth daily.     atorvastatin  (LIPITOR) 20 MG tablet Take 1 tablet (20 mg total) by mouth daily. 90 tablet 3   CALCIUM -VITAMIN D  PO Take by mouth.  chlorpheniramine-HYDROcodone  (TUSSIONEX) 10-8 MG/5ML Take 5 mLs by mouth every 12 (twelve) hours as needed. 120 mL 0   furosemide  (LASIX ) 20 MG tablet Take 1 tablet (20 mg total) by mouth daily. 30 tablet 3   levothyroxine  (SYNTHROID ) 100 MCG tablet TAKE 1 TABLET(100 MCG) BY MOUTH DAILY 90 tablet 1   Methylcobalamin (B12-ACTIVE PO) Take 1 tablet by mouth daily.     MILK THISTLE PO Take by mouth.     moxifloxacin  (VIGAMOX ) 0.5 % ophthalmic solution Place 1 drop into the left eye 3 (three) times daily. 3 mL 1   Multiple Vitamin (MULTIVITAMIN) tablet Take 1 tablet by mouth daily.     pantoprazole  (PROTONIX ) 40 MG tablet TAKE 1 TABLET(40 MG) BY MOUTH DAILY 90 tablet 1   predniSONE  (STERAPRED UNI-PAK 21 TAB) 10 MG (21) TBPK tablet Use as directed. 21 each 0    VITAMIN D  PO Take 1 capsule by mouth daily.     zinc gluconate 50 MG tablet Take 50 mg by mouth daily. (Patient not taking: Reported on 08/17/2023)     No current facility-administered medications on file prior to visit.   Allergies  Allergen Reactions   Macadamia Nut Oil Anaphylaxis   Other Swelling    POPCORN:  SWELLING THROAT   Peanut-Containing Drug Products Swelling and Other (See Comments)    THROAT TIGHTNESS; ALSO WALNUTS   Pistachio Nut (Diagnostic) Anaphylaxis   Tomato Swelling    SWELLING UVULA   Adhesive [Tape] Other (See Comments)    BLISTERS   Flour Other (See Comments)    SNEEZING   Penicillins Hives   Azithromycin  Swelling    Per pt lips swells up   Oxycodone      Unknown reaction   Sulfa Antibiotics Nausea Only   Vicodin [Hydrocodone -Acetaminophen ] Hives and Swelling    Pt had flushing, then itching and swelling of lips   Soap Other (See Comments)    FRAGRANCES (SOAP/LOTION/PERFUME):  HEADACHE, RUNNY NOSE   Tramadol  Nausea And Vomiting   Social History   Socioeconomic History   Marital status: Married    Spouse name: Belenda Alviar   Number of children: 4   Years of education: 12   Highest education level: 12th grade  Occupational History    Employer: [employer]    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[employer]  Tobacco Use   Smoking status: Former    Current packs/day: 0.00    Average packs/day: 0.5 packs/day for 8.0 years (4.0 ttl pk-yrs)    Types: Cigarettes    Start date: 03/15/1996    Quit date: 03/15/2004    Years since quitting: 19.7    Passive exposure: Past   Smokeless tobacco: Never  Vaping Use   Vaping status: Never Used  Substance and Sexual Activity   Alcohol use: No   Drug use: No    Comment: quit smoking 11 years ago   Sexual activity: Yes    Birth control/protection: Post-menopausal  Other Topics Concern   Not on file  Social History Narrative   1 daughter and 3 sons   5 grandchildren   Social Drivers of Research scientist (physical sciences) Strain: Low Risk  (02/15/2023)   Overall Financial Resource Strain (CARDIA)    Difficulty of Paying Living Expenses: Not hard at all  Food Insecurity: No Food Insecurity (02/15/2023)   Hunger Vital Sign    Worried About Running Out of Food in the Last Year: Never true    Ran Out of Food in the Last  Year: Never true  Transportation Needs: No Transportation Needs (02/15/2023)   PRAPARE - Administrator, Civil Service (Medical): No    Lack of Transportation (Non-Medical): No  Physical Activity: Inactive (02/15/2023)   Exercise Vital Sign    Days of Exercise per Week: 0 days    Minutes of Exercise per Session: 0 min  Stress: No Stress Concern Present (02/15/2023)   [employer] of Occupational Health - Occupational Stress Questionnaire    Feeling of Stress : Not at all  Social Connections: Socially Integrated (02/15/2023)   Social Connection and Isolation Panel    Frequency of Communication with Friends and Family: More than three times a week    Frequency of Social Gatherings with Friends and Family: More than three times a week    Attends Religious Services: More than 4 times per year    Active Member of [employer] or Organizations: Yes    Attends Banker Meetings: More than 4 times per year    Marital Status: Married  Catering manager Violence: Not At Risk (02/15/2023)   Humiliation, Afraid, Rape, and Kick questionnaire    Fear of Current or Ex-Partner: No    Emotionally Abused: No    Physically Abused: No    Sexually Abused: No      Review of Systems     Objective:   Physical Exam Vitals reviewed.  Constitutional:      General: She is not in acute distress.    Appearance: She is obese. She is not toxic-appearing or diaphoretic.  HENT:     Nose: No mucosal edema.     Right Sinus: No maxillary sinus tenderness or frontal sinus tenderness.     Left Sinus: No maxillary sinus tenderness or frontal sinus tenderness.  Neck:     Thyroid : No thyroid   mass, thyromegaly or thyroid  tenderness.  Cardiovascular:     Rate and Rhythm: Normal rate and regular rhythm.     Heart sounds: Normal heart sounds. No murmur heard.    No friction rub. No gallop.  Pulmonary:     Effort: Pulmonary effort is normal. No respiratory distress.     Breath sounds: Normal breath sounds. No stridor. No wheezing or rales.  Musculoskeletal:     Cervical back: Full passive range of motion without pain. No edema or erythema. No pain with movement or muscular tenderness. Normal range of motion.  Neurological:     Mental Status: She is alert.  Psychiatric:        Mood and Affect: Mood normal.        Behavior: Behavior normal.        Thought Content: Thought content normal.        Judgment: Judgment normal.           Assessment & Plan:  Pure hypercholesterolemia - Plan: Comprehensive metabolic panel with GFR, CBC with Differential/Platelet, TSH, Lipid panel  Other specified hypothyroidism - Plan: Comprehensive metabolic panel with GFR, CBC with Differential/Platelet, TSH, Lipid panel  NASH (nonalcoholic steatohepatitis) Recommend 30-60 minutes per day of aerobic exercise.  Recommend low fat diet and 1500 cal/day.  Try phentermine  37.5 mg poqday for 90 days.  Check cmp, flp, tsh.  BP is at goal.  Stressed importance of weight loss for NASH.

## 2023-11-23 LAB — CBC WITH DIFFERENTIAL/PLATELET
Absolute Lymphocytes: 1155 {cells}/uL (ref 850–3900)
Absolute Monocytes: 683 {cells}/uL (ref 200–950)
Basophils Absolute: 53 {cells}/uL (ref 0–200)
Basophils Relative: 0.7 %
Eosinophils Absolute: 368 {cells}/uL (ref 15–500)
Eosinophils Relative: 4.9 %
HCT: 44.4 % (ref 35.0–45.0)
Hemoglobin: 14.5 g/dL (ref 11.7–15.5)
MCH: 32.2 pg (ref 27.0–33.0)
MCHC: 32.7 g/dL (ref 32.0–36.0)
MCV: 98.4 fL (ref 80.0–100.0)
MPV: 11.7 fL (ref 7.5–12.5)
Monocytes Relative: 9.1 %
Neutro Abs: 5243 {cells}/uL (ref 1500–7800)
Neutrophils Relative %: 69.9 %
Platelets: 149 Thousand/uL (ref 140–400)
RBC: 4.51 Million/uL (ref 3.80–5.10)
RDW: 13.4 % (ref 11.0–15.0)
Total Lymphocyte: 15.4 %
WBC: 7.5 Thousand/uL (ref 3.8–10.8)

## 2023-11-23 LAB — COMPREHENSIVE METABOLIC PANEL WITH GFR
AG Ratio: 1.5 (calc) (ref 1.0–2.5)
ALT: 39 U/L — ABNORMAL HIGH (ref 6–29)
AST: 46 U/L — ABNORMAL HIGH (ref 10–35)
Albumin: 4.3 g/dL (ref 3.6–5.1)
Alkaline phosphatase (APISO): 64 U/L (ref 37–153)
BUN: 15 mg/dL (ref 7–25)
CO2: 27 mmol/L (ref 20–32)
Calcium: 9.5 mg/dL (ref 8.6–10.4)
Chloride: 105 mmol/L (ref 98–110)
Creat: 1.01 mg/dL (ref 0.50–1.05)
Globulin: 2.9 g/dL (ref 1.9–3.7)
Glucose, Bld: 101 mg/dL — ABNORMAL HIGH (ref 65–99)
Potassium: 4.4 mmol/L (ref 3.5–5.3)
Sodium: 140 mmol/L (ref 135–146)
Total Bilirubin: 0.8 mg/dL (ref 0.2–1.2)
Total Protein: 7.2 g/dL (ref 6.1–8.1)
eGFR: 60 mL/min/1.73m2 (ref >=60)

## 2023-11-23 LAB — LIPID PANEL
Cholesterol: 147 mg/dL (ref <200)
HDL: 49 mg/dL — ABNORMAL LOW (ref >=50)
LDL Cholesterol (Calc): 74 mg/dL
Non-HDL Cholesterol (Calc): 98 mg/dL (ref <130)
Total CHOL/HDL Ratio: 3 (calc) (ref <5.0)
Triglycerides: 162 mg/dL — ABNORMAL HIGH (ref <150)

## 2023-11-23 LAB — TSH: TSH: 2.83 m[IU]/L (ref 0.40–4.50)

## 2023-11-24 ENCOUNTER — Ambulatory Visit: Payer: Self-pay | Admitting: Family Medicine

## 2023-12-06 ENCOUNTER — Ambulatory Visit: Admitting: Family Medicine

## 2023-12-12 DIAGNOSIS — H01001 Unspecified blepharitis right upper eyelid: Secondary | ICD-10-CM | POA: Diagnosis not present

## 2023-12-12 DIAGNOSIS — B880 Other acariasis: Secondary | ICD-10-CM | POA: Diagnosis not present

## 2023-12-12 DIAGNOSIS — H01004 Unspecified blepharitis left upper eyelid: Secondary | ICD-10-CM | POA: Diagnosis not present

## 2023-12-28 ENCOUNTER — Ambulatory Visit (INDEPENDENT_AMBULATORY_CARE_PROVIDER_SITE_OTHER)

## 2023-12-28 DIAGNOSIS — Z23 Encounter for immunization: Secondary | ICD-10-CM

## 2023-12-28 NOTE — Progress Notes (Signed)
 Patient is in office today for a nurse visit for HD flu Immunization. Patient Injection was given in the  Left deltoid. Patient tolerated injection well.  Elida GORMAN Manila, CMA

## 2024-01-03 ENCOUNTER — Other Ambulatory Visit: Payer: Self-pay | Admitting: Family Medicine

## 2024-01-03 DIAGNOSIS — E7800 Pure hypercholesterolemia, unspecified: Secondary | ICD-10-CM

## 2024-01-11 ENCOUNTER — Telehealth: Payer: Self-pay | Admitting: *Deleted

## 2024-01-11 NOTE — Telephone Encounter (Signed)
 Called to confirm appointment for Amanda Hoover tomorrow is a Telephone visit .

## 2024-01-12 ENCOUNTER — Ambulatory Visit (INDEPENDENT_AMBULATORY_CARE_PROVIDER_SITE_OTHER): Payer: Medicare Other | Admitting: *Deleted

## 2024-01-12 VITALS — Ht 64.0 in | Wt 246.0 lb

## 2024-01-12 DIAGNOSIS — Z0000 Encounter for general adult medical examination without abnormal findings: Secondary | ICD-10-CM

## 2024-01-12 NOTE — Progress Notes (Signed)
 Subjective:   Amanda Hoover is a 68 y.o. female who presents for Medicare Annual (Subsequent) preventive examination.  Visit Complete: Virtual I connected with  Channing VEAR Roof on 01/12/24 by a audio enabled telemedicine application and verified that I am speaking with the correct person using two identifiers.  Patient Location: Home  Provider Location: Home Office  I discussed the limitations of evaluation and management by telemedicine. The patient expressed understanding and agreed to proceed.  Vital Signs: Because this visit was a virtual/telehealth visit, some criteria may be missing or patient reported. Any vitals not documented were not able to be obtained and vitals that have been documented are patient reported.  Cardiac Risk Factors include: advanced age (>52men, >49 women);obesity (BMI >30kg/m2)     Objective:    Today's Vitals   01/12/24 0946  Weight: 246 lb (111.6 kg)  Height: 5' 4 (1.626 m)   Body mass index is 42.23 kg/m.     01/12/2024    9:45 AM 02/15/2023    2:29 PM 01/06/2023    9:38 AM 08/31/2022    7:48 PM 01/04/2022    3:37 PM 12/18/2020    9:17 AM 11/19/2020    6:52 AM  Advanced Directives  Does Patient Have a Medical Advance Directive? No Yes No No Yes No No  Type of Special Educational Needs Teacher of [place];Living will       Does patient want to make changes to medical advance directive?  No - Patient declined       Copy of Healthcare Power of Attorney in Chart?  No - copy requested       Would patient like information on creating a medical advance directive? No - Patient declined  Yes (MAU/Ambulatory/Procedural Areas - Information given) No - Patient declined  No - Patient declined No - Patient declined    Current Medications (verified) Outpatient Encounter Medications as of 01/12/2024  Medication Sig   alendronate  (FOSAMAX ) 70 MG tablet Take 1 tablet (70 mg total) by mouth every 7 (seven) days. Take with a full glass of water on an  empty stomach.   ALPRAZolam  (XANAX ) 0.5 MG tablet Take 1 tablet (0.5 mg total) by mouth 2 (two) times daily as needed for anxiety.   ascorbic acid (VITAMIN C) 500 MG tablet Take 500 mg by mouth daily.   atorvastatin  (LIPITOR) 20 MG tablet TAKE 1 TABLET(20 MG) BY MOUTH DAILY   CALCIUM -VITAMIN D  PO Take by mouth.   furosemide  (LASIX ) 20 MG tablet Take 1 tablet (20 mg total) by mouth daily.   levothyroxine  (SYNTHROID ) 100 MCG tablet TAKE 1 TABLET(100 MCG) BY MOUTH DAILY   Methylcobalamin (B12-ACTIVE PO) Take 1 tablet by mouth daily.   MILK THISTLE PO Take by mouth.   moxifloxacin  (VIGAMOX ) 0.5 % ophthalmic solution Place 1 drop into the left eye 3 (three) times daily.   Multiple Vitamin (MULTIVITAMIN) tablet Take 1 tablet by mouth daily.   pantoprazole  (PROTONIX ) 40 MG tablet TAKE 1 TABLET(40 MG) BY MOUTH DAILY   phentermine  (ADIPEX-P ) 37.5 MG tablet Take 1 tablet (37.5 mg total) by mouth daily before breakfast.   VITAMIN D  PO Take 1 capsule by mouth daily.   zinc gluconate 50 MG tablet Take 50 mg by mouth daily.   No facility-administered encounter medications on file as of 01/12/2024.    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   Breast cancer, right (HCC)    ER 0 PR0 LN neg.   Chest pain    a. false positive NST in 10/2020 with cath in 11/2020 showing only minimal CAD with 10% mid-LAD stenosis.   Chronic headaches    Chronic ITP (idiopathic thrombocytopenia) (HCC)    mild thrombocytopenia, normal spleen on us  11/18, mild cirrhosis   Cirrhosis (HCC)    Dental crowns present     Depression    Fatty liver    GERD (gastroesophageal reflux disease)    Heart murmur    Hiatal hernia    History of colon polyps    History of esophageal stricture    Status post esophageal dilatation   History of radiation therapy 03/28/2012-05/17/2012   60.4 gray to right breast   Hyperlipidemia    Hypothyroid    Multiple environmental allergies    Multiple food allergies    Osteopenia    Osteoporosis    Palpitations    Personal history of radiation therapy 2013   Right Breast Cancer   Rash 01/06/2012   right elbow   Rhinitis, allergic    rhinitis   Past Surgical History:  Procedure Laterality Date   BREAST LUMPECTOMY Right 2013   COLONOSCOPY     COLONOSCOPY W/ POLYPECTOMY     ESOPHAGOGASTRODUODENOSCOPY     LEFT HEART CATH AND CORONARY ANGIOGRAPHY N/A 11/19/2020   Procedure: LEFT HEART CATH AND CORONARY ANGIOGRAPHY;  Surgeon: Burnard Debby LABOR, MD;  Location: [facility];  Service: Cardiovascular;  Laterality: N/A;   MASTECTOMY Right 2013   partial mast   PARTIAL MASTECTOMY WITH NEEDLE LOCALIZATION AND AXILLARY SENTINEL LYMPH NODE BX  01/12/2012   Procedure: PARTIAL MASTECTOMY WITH NEEDLE LOCALIZATION AND AXILLARY SENTINEL LYMPH NODE BX;  Surgeon: Elon CHRISTELLA Pacini, MD;  Location: [facility];  Service: General;  Laterality: Right;   RE-EXCISION OF BREAST CANCER,SUPERIOR MARGINS  02/15/2012   Procedure: RE-EXCISION OF BREAST CANCER,SUPERIOR MARGINS;  Surgeon: Elon CHRISTELLA Pacini, MD;  Location: [facility];  Service: General;  Laterality: Right;  Re-excision of right lumpectomy, close posterior margin.   Family History  Problem Relation Age of Onset   Crohn's disease Mother    Irritable bowel syndrome Mother    Hypertension Mother    Hyperlipidemia Mother    Stroke Mother    Thyroid  disease Mother    Depression Mother    Esophageal cancer Father 61   Cancer Father    Cancer Maternal Uncle        unknown form of cancer   Breast cancer  Paternal Aunt 35   Diabetes Maternal Grandmother    Breast cancer Paternal Grandmother 71   Diabetes Paternal Grandmother    Cancer Paternal Grandmother    Melanoma Cousin        maternal cousin dx in her 28s   Breast cancer Other    Liver disease Other    Colon cancer Neg Hx    Rectal cancer Neg Hx    Stomach cancer Neg Hx    Colon polyps Neg Hx    Social History   Socioeconomic History   Marital status: Married    Spouse name: Milynn Quirion   Number of children: 4   Years of education: 12   Highest  education level: 12th grade  Occupational History    Employer: [employer]    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[employer]  Tobacco Use   Smoking status: Former    Current packs/day: 0.00    Average packs/day: 0.5 packs/day for 8.0 years (4.0 ttl pk-yrs)    Types: Cigarettes    Start date: 03/15/1996    Quit date: 03/15/2004    Years since quitting: 19.8    Passive exposure: Past   Smokeless tobacco: Never  Vaping Use   Vaping status: Never Used  Substance and Sexual Activity   Alcohol use: No   Drug use: No    Comment: quit smoking 11 years ago   Sexual activity: Yes    Birth control/protection: Post-menopausal  Other Topics Concern   Not on file  Social History Narrative   1 daughter and 3 sons   5 grandchildren   Social Drivers of Corporate Investment Banker Strain: Low Risk  (01/12/2024)   Overall Financial Resource Strain (CARDIA)    Difficulty of Paying Living Expenses: Not hard at all  Food Insecurity: No Food Insecurity (01/12/2024)   Hunger Vital Sign    Worried About Running Out of Food in the Last Year: Never true    Ran Out of Food in the Last Year: Never true  Transportation Needs: No Transportation Needs (01/12/2024)   PRAPARE - Administrator, Civil Service (Medical): No    Lack of Transportation (Non-Medical): No  Physical Activity: Inactive (01/12/2024)   Exercise Vital Sign    Days of Exercise per Week: 0 days    Minutes of Exercise  per Session: 0 min  Stress: No Stress Concern Present (01/12/2024)   [employer] of Occupational Health - Occupational Stress Questionnaire    Feeling of Stress: Not at all  Social Connections: Socially Integrated (01/12/2024)   Social Connection and Isolation Panel    Frequency of Communication with Friends and Family: More than three times a week    Frequency of Social Gatherings with Friends and Family: More than three times a week    Attends Religious Services: More than 4 times per year    Active Member of [employer] or Organizations: Yes    Attends Engineer, Structural: More than 4 times per year    Marital Status: Married    Tobacco Counseling Counseling given: Not Answered   Clinical Intake:  Pre-visit preparation completed: Yes  Pain : No/denies pain     Diabetes: No  How often do you need to have someone help you when you read instructions, pamphlets, or other written materials from your doctor or pharmacy?: 1 - Never  Interpreter Needed?: No  Information entered by :: Mliss Graff LPN   Activities of Daily Living    01/12/2024    9:46 AM 02/15/2023    2:30 PM  In your present state of health, do you have any difficulty performing the following activities:  Hearing? 0 0  Vision? 0 0  Difficulty concentrating or making decisions? 0 0  Walking or climbing stairs? 0 0  Dressing or bathing? 0 0  Doing errands, shopping? 0 0  Preparing Food and eating ? N N  Using the Toilet? N N  In the past six months, have you accidently leaked urine? Y N  Do you have problems with loss of bowel control? N N  Managing your Medications? N N  Managing your Finances? N N  Housekeeping or managing your Housekeeping? N N  Patient Care Team: Duanne Butler DASEN, MD as PCP - General (Family Medicine) Debera Jayson MATSU, MD as PCP - Cardiology (Cardiology)  Indicate any recent Medical Services you may have received from other than [facility] providers in the past year  (date may be approximate).     Assessment:   This is a routine wellness examination for [place].  Hearing/Vision screen Hearing Screening - Comments:: No trouble hearing Vision Screening - Comments:: Up to date [facility]        Goals Addressed             This Visit's Progress    DIET - EAT MORE FRUITS AND VEGETABLES   On track    Exercise 3x per week (30 min per time)   Not on track    Attend local Y, has membership.     Weight (lb) < 200 lb (90.7 kg)   246 lb (111.6 kg)      Depression Screen    01/12/2024    9:41 AM 11/22/2023    8:38 AM 05/12/2023   11:38 AM 02/15/2023    2:26 PM 01/06/2023    9:44 AM 10/19/2022    2:36 PM 08/31/2022    7:46 PM  PHQ 2/9 Scores  PHQ - 2 Score 0 0 0 0 0 0 0  PHQ- 9 Score 6          Fall Risk    01/12/2024    9:45 AM 01/12/2024    9:43 AM 11/22/2023    8:38 AM 05/12/2023   11:38 AM 02/15/2023    2:30 PM  Fall Risk   Falls in the past year? 0 0 0 0 0  Number falls in past yr: 0 0 0 0 0  Injury with Fall? 0 0 0 0 0  Risk for fall due to :   No Fall Risks No Fall Risks No Fall Risks  Follow up Falls evaluation completed;Education provided;Falls prevention discussed Falls evaluation completed;Education provided;Falls prevention discussed Falls evaluation completed Falls prevention discussed;Falls evaluation completed Falls prevention discussed;Falls evaluation completed;Education provided    MEDICARE RISK AT HOME: Medicare Risk at Home Any stairs in or around the home?: No If so, are there any without handrails?: No Home free of loose throw rugs in walkways, pet beds, electrical cords, etc?: Yes Adequate lighting in your home to reduce risk of falls?: Yes Life alert?: No Use of a cane, walker or w/c?: No Grab bars in the bathroom?: No Shower chair or bench in shower?: No Elevated toilet seat or a handicapped toilet?: Yes  TIMED UP AND GO:  Was the test performed?  No    Cognitive Function:        01/12/2024    9:48 AM  01/06/2023    9:38 AM 01/04/2022    3:43 PM 12/18/2020    9:23 AM  6CIT Screen  What Year? 0 points 0 points 0 points 0 points  What month? 0 points 0 points 0 points 0 points  What time? 0 points 0 points  0 points  Count back from 20 0 points 0 points 0 points 0 points  Months in reverse 0 points 0 points 0 points 0 points  Repeat phrase 0 points 0 points 2 points 0 points  Total Score 0 points 0 points  0 points    Immunizations Immunization History  Administered Date(s) Administered   Fluad Quad(high Dose 65+) 01/08/2021   Fluad Trivalent(High Dose 65+) 12/28/2022   Hep A / Hep B  01/08/2019, 02/16/2019, 07/09/2019   INFLUENZA, HIGH DOSE SEASONAL PF 12/28/2023   Influenza, Seasonal, Injecte, Preservative Fre 01/05/2016   Influenza,inj,Quad PF,6+ Mos 03/13/2013, 11/26/2013, 01/12/2017, 12/21/2018, 01/04/2022   Influenza-Unspecified 12/13/2017   PFIZER(Purple Top)SARS-COV-2 Vaccination 04/04/2019, 04/25/2019   Pneumococcal Polysaccharide-23 10/19/2022   Tdap 10/09/2019    TDAP status: Up to date  Flu Vaccine status: Up to date  Pneumococcal vaccine status: Up to date  Covid-19 vaccine status: Declined, Education has been provided regarding the importance of this vaccine but patient still declined. Advised may receive this vaccine at local pharmacy or Health Dept.or vaccine clinic. Aware to provide a copy of the vaccination record if obtained from local pharmacy or Health Dept. Verbalized acceptance and understanding.  Qualifies for Shingles Vaccine? Yes   Zostavax completed No   Shingrix Completed?: No.    Education has been provided regarding the importance of this vaccine. Patient has been advised to call insurance company to determine out of pocket expense if they have not yet received this vaccine. Advised may also receive vaccine at local pharmacy or Health Dept. Verbalized acceptance and understanding.  Screening Tests Health Maintenance  Topic Date Due   COVID-19  Vaccine (3 - Pfizer risk series) 01/28/2024 (Originally 05/23/2019)   Zoster Vaccines- Shingrix (1 of 2) 02/21/2024 (Originally 01/30/1975)   Pneumococcal Vaccine: 50+ Years (2 of 2 - PCV) 11/21/2024 (Originally 10/19/2023)   Mammogram  04/25/2024   Medicare Annual Wellness (AWV)  01/11/2025   Colonoscopy  05/06/2029   DTaP/Tdap/Td (2 - Td or Tdap) 10/08/2029   Influenza Vaccine  Completed   DEXA SCAN  Completed   Hepatitis C Screening  Completed   Meningococcal B Vaccine  Aged Out   Hepatitis B Vaccines 19-59 Average Risk  Discontinued    Health Maintenance  There are no preventive care reminders to display for this patient.   Colorectal cancer screening: Type of screening: Colonoscopy. Completed 2024. Repeat every 7 years  Mammogram status: Completed  . Repeat every year  Bone Density status: Completed 2025. Results reflect: Bone density results: OSTEOPOROSIS. Repeat every 2 years.  Lung Cancer Screening: (Low Dose CT Chest recommended if Age 66-80 years, 20 pack-year currently smoking OR have quit w/in 15years.) does not qualify.   Lung Cancer Screening Referral:   Additional Screening:  Hepatitis C Screening: does not qualify; Completed 2020  Vision Screening: Recommended annual ophthalmology exams for early detection of glaucoma and other disorders of the eye. Is the patient up to date with their annual eye exam?  Yes  Who is the provider or what is the name of the office in which the patient attends annual eye exams? [facility] If pt is not established with a provider, would they like to be referred to a provider to establish care? No .   Dental Screening: Recommended annual dental exams for proper oral hygiene    Community Resource Referral / Chronic Care Management: CRR required this visit?  No   CCM required this visit?  No     Plan:     I have personally reviewed and noted the following in the patient's chart:   Medical and social history Use of alcohol, tobacco  or illicit drugs  Current medications and supplements including opioid prescriptions. Patient is not currently taking opioid prescriptions. Functional ability and status Nutritional status Physical activity Advanced directives List of other physicians Hospitalizations, surgeries, and ER visits in previous 12 months Vitals Screenings to include cognitive, depression, and falls Referrals and appointments  In addition,  I have reviewed and discussed with patient certain preventive protocols, quality metrics, and best practice recommendations. A written personalized care plan for preventive services as well as general preventive health recommendations were provided to patient.     Mliss Graff, LPN   [id number]   After Visit Summary: (MyChart) Due to this being a telephonic visit, the after visit summary with patients personalized plan was offered to patient via MyChart   Nurse Notes:

## 2024-01-12 NOTE — Patient Instructions (Signed)
 Amanda Hoover , Thank you for taking time to come for your Medicare Wellness Visit. I appreciate your ongoing commitment to your health goals. Please review the following plan we discussed and let me know if I can assist you in the future.   Screening recommendations/referrals: Colonoscopy: up to date Mammogram: up to date Bone Density: up to date Recommended yearly ophthalmology/optometry visit for glaucoma screening and checkup Recommended yearly dental visit for hygiene and checkup  Vaccinations: Influenza vaccine: up to date Pneumococcal vaccine: up to date Tdap vaccine: up to date      Preventive Care 65 Years and Older, Female Preventive care refers to lifestyle choices and visits with your health care provider that can promote health and wellness. What does preventive care include? A yearly physical exam. This is also called an annual well check. Dental exams once or twice a year. Routine eye exams. Ask your health care provider how often you should have your eyes checked. Personal lifestyle choices, including: Daily care of your teeth and gums. Regular physical activity. Eating a healthy diet. Avoiding tobacco and drug use. Limiting alcohol use. Practicing safe sex. Taking low-dose aspirin  every day. Taking vitamin and mineral supplements as recommended by your health care provider. What happens during an annual well check? The services and screenings done by your health care provider during your annual well check will depend on your age, overall health, lifestyle risk factors, and family history of disease. Counseling  Your health care provider may ask you questions about your: Alcohol use. Tobacco use. Drug use. Emotional well-being. Home and relationship well-being. Sexual activity. Eating habits. History of falls. Memory and ability to understand (cognition). Work and work astronomer. Reproductive health. Screening  You may have the following tests or  measurements: Height, weight, and BMI. Blood pressure. Lipid and cholesterol levels. These may be checked every 5 years, or more frequently if you are over 29 years old. Skin check. Lung cancer screening. You may have this screening every year starting at age 21 if you have a 30-pack-year history of smoking and currently smoke or have quit within the past 15 years. Fecal occult blood test (FOBT) of the stool. You may have this test every year starting at age 76. Flexible sigmoidoscopy or colonoscopy. You may have a sigmoidoscopy every 5 years or a colonoscopy every 10 years starting at age 73. Hepatitis C blood test. Hepatitis B blood test. Sexually transmitted disease (STD) testing. Diabetes screening. This is done by checking your blood sugar (glucose) after you have not eaten for a while (fasting). You may have this done every 1-3 years. Bone density scan. This is done to screen for osteoporosis. You may have this done starting at age 3. Mammogram. This may be done every 1-2 years. Talk to your health care provider about how often you should have regular mammograms. Talk with your health care provider about your test results, treatment options, and if necessary, the need for more tests. Vaccines  Your health care provider may recommend certain vaccines, such as: Influenza vaccine. This is recommended every year. Tetanus, diphtheria, and acellular pertussis (Tdap, Td) vaccine. You may need a Td booster every 10 years. Zoster vaccine. You may need this after age 54. Pneumococcal 13-valent conjugate (PCV13) vaccine. One dose is recommended after age 51. Pneumococcal polysaccharide (PPSV23) vaccine. One dose is recommended after age 32. Talk to your health care provider about which screenings and vaccines you need and how often you need them. This information is not intended to  replace advice given to you by your health care provider. Make sure you discuss any questions you have with your  health care provider. Document Released: 03/28/2015 Document Revised: 11/19/2015 Document Reviewed: 12/31/2014 Elsevier Interactive Patient Education  2017 [employer].  Fall Prevention in the Home Falls can cause injuries. They can happen to people of all ages. There are many things you can do to make your home safe and to help prevent falls. What can I do on the outside of my home? Regularly fix the edges of walkways and driveways and fix any cracks. Remove anything that might make you trip as you walk through a door, such as a raised step or threshold. Trim any bushes or trees on the path to your home. Use bright outdoor lighting. Clear any walking paths of anything that might make someone trip, such as rocks or tools. Regularly check to see if handrails are loose or broken. Make sure that both sides of any steps have handrails. Any raised decks and porches should have guardrails on the edges. Have any leaves, snow, or ice cleared regularly. Use sand or salt on walking paths during winter. Clean up any spills in your garage right away. This includes oil or grease spills. What can I do in the bathroom? Use night lights. Install grab bars by the toilet and in the tub and shower. Do not use towel bars as grab bars. Use non-skid mats or decals in the tub or shower. If you need to sit down in the shower, use a plastic, non-slip stool. Keep the floor dry. Clean up any water that spills on the floor as soon as it happens. Remove soap buildup in the tub or shower regularly. Attach bath mats securely with double-sided non-slip rug tape. Do not have throw rugs and other things on the floor that can make you trip. What can I do in the bedroom? Use night lights. Make sure that you have a light by your bed that is easy to reach. Do not use any sheets or blankets that are too big for your bed. They should not hang down onto the floor. Have a firm chair that has side arms. You can use this for  support while you get dressed. Do not have throw rugs and other things on the floor that can make you trip. What can I do in the kitchen? Clean up any spills right away. Avoid walking on wet floors. Keep items that you use a lot in easy-to-reach places. If you need to reach something above you, use a strong step stool that has a grab bar. Keep electrical cords out of the way. Do not use floor polish or wax that makes floors slippery. If you must use wax, use non-skid floor wax. Do not have throw rugs and other things on the floor that can make you trip. What can I do with my stairs? Do not leave any items on the stairs. Make sure that there are handrails on both sides of the stairs and use them. Fix handrails that are broken or loose. Make sure that handrails are as long as the stairways. Check any carpeting to make sure that it is firmly attached to the stairs. Fix any carpet that is loose or worn. Avoid having throw rugs at the top or bottom of the stairs. If you do have throw rugs, attach them to the floor with carpet tape. Make sure that you have a light switch at the top of the stairs and the  bottom of the stairs. If you do not have them, ask someone to add them for you. What else can I do to help prevent falls? Wear shoes that: Do not have high heels. Have rubber bottoms. Are comfortable and fit you well. Are closed at the toe. Do not wear sandals. If you use a stepladder: Make sure that it is fully opened. Do not climb a closed stepladder. Make sure that both sides of the stepladder are locked into place. Ask someone to hold it for you, if possible. Clearly mark and make sure that you can see: Any grab bars or handrails. First and last steps. Where the edge of each step is. Use tools that help you move around (mobility aids) if they are needed. These include: Canes. Walkers. Scooters. Crutches. Turn on the lights when you go into a dark area. Replace any light bulbs as soon  as they burn out. Set up your furniture so you have a clear path. Avoid moving your furniture around. If any of your floors are uneven, fix them. If there are any pets around you, be aware of where they are. Review your medicines with your doctor. Some medicines can make you feel dizzy. This can increase your chance of falling. Ask your doctor what other things that you can do to help prevent falls. This information is not intended to replace advice given to you by your health care provider. Make sure you discuss any questions you have with your health care provider. Document Released: 12/26/2008 Document Revised: 08/07/2015 Document Reviewed: 04/05/2014 Elsevier Interactive Patient Education  2017 [employer].

## 2024-01-26 ENCOUNTER — Other Ambulatory Visit: Payer: Self-pay | Admitting: Genetic Counselor

## 2024-01-26 ENCOUNTER — Inpatient Hospital Stay: Attending: Genetic Counselor | Admitting: Genetic Counselor

## 2024-01-26 ENCOUNTER — Inpatient Hospital Stay

## 2024-01-26 ENCOUNTER — Encounter: Payer: Self-pay | Admitting: Genetic Counselor

## 2024-01-26 DIAGNOSIS — Z803 Family history of malignant neoplasm of breast: Secondary | ICD-10-CM | POA: Diagnosis present

## 2024-01-26 DIAGNOSIS — C50311 Malignant neoplasm of lower-inner quadrant of right female breast: Secondary | ICD-10-CM

## 2024-01-26 DIAGNOSIS — Z853 Personal history of malignant neoplasm of breast: Secondary | ICD-10-CM | POA: Insufficient documentation

## 2024-01-26 DIAGNOSIS — Z1379 Encounter for other screening for genetic and chromosomal anomalies: Secondary | ICD-10-CM | POA: Diagnosis present

## 2024-01-26 DIAGNOSIS — Z808 Family history of malignant neoplasm of other organs or systems: Secondary | ICD-10-CM | POA: Insufficient documentation

## 2024-01-26 LAB — GENETIC SCREENING ORDER

## 2024-01-26 NOTE — Progress Notes (Signed)
 REFERRING PROVIDER: Duanne Butler DASEN, MD [address] [place],  [place] [address]  PRIMARY PROVIDER:  Duanne Butler DASEN, MD  PRIMARY REASON FOR VISIT:  1. Family history of breast cancer   2. Family history of melanoma   3. 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 female, was seen for a [facility] cancer genetics consultation due to a personal and family history of breast cancer.  Amanda Hoover presents to clinic today to discuss the possibility of a hereditary predisposition to cancer, genetic testing, and to further clarify her future cancer risks, as well as potential cancer risks for family members.   In 2013, at the age of 89, Amanda Hoover was diagnosed with cancer of the left breast. The treatment plan lumpectomy and radiation. She had genetic testing at that time which was negative.   CANCER HISTORY:  Oncology History   No history exists.     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   Breast cancer, right (HCC)    ER 0 PR0 LN neg.   Chest pain    a. false positive NST in 10/2020 with cath in 11/2020 showing only minimal CAD with 10% mid-LAD stenosis.   Chronic headaches    Chronic ITP (idiopathic thrombocytopenia) (HCC)    mild thrombocytopenia, normal spleen on us  11/18, mild cirrhosis   Cirrhosis (HCC)    Dental crowns present    Depression    Family history of breast cancer    Family history of melanoma    Fatty liver    GERD (gastroesophageal reflux disease)    Heart murmur    Hiatal hernia    History of colon polyps    History of esophageal stricture    Status post esophageal dilatation   History of radiation therapy 03/28/2012-05/17/2012   60.4 gray to right breast   Hyperlipidemia    Hypothyroid    Multiple environmental  allergies    Multiple food allergies    Osteopenia    Osteoporosis    Palpitations    Personal history of radiation therapy 2013   Right Breast Cancer   Rash 01/06/2012   right elbow   Rhinitis, allergic    rhinitis    Past Surgical History:  Procedure Laterality Date   BREAST LUMPECTOMY Right 2013   COLONOSCOPY     COLONOSCOPY W/ POLYPECTOMY     ESOPHAGOGASTRODUODENOSCOPY     LEFT HEART CATH AND CORONARY ANGIOGRAPHY N/A 11/19/2020   Procedure: LEFT HEART CATH AND CORONARY ANGIOGRAPHY;  Surgeon: Burnard Debby LABOR, MD;  Location: [facility];  Service: Cardiovascular;  Laterality: N/A;   MASTECTOMY Right 2013   partial mast   PARTIAL MASTECTOMY WITH NEEDLE LOCALIZATION AND AXILLARY SENTINEL LYMPH NODE BX  01/12/2012   Procedure: PARTIAL MASTECTOMY WITH NEEDLE LOCALIZATION AND AXILLARY SENTINEL LYMPH NODE BX;  Surgeon: Elon CHRISTELLA Pacini, MD;  Location: [facility];  Service: General;  Laterality: Right;   RE-EXCISION OF BREAST CANCER,SUPERIOR MARGINS  02/15/2012   Procedure: RE-EXCISION OF BREAST CANCER,SUPERIOR MARGINS;  Surgeon: Elon CHRISTELLA Pacini, MD;  Location:  SURGERY CENTER;  Service: General;  Laterality: Right;  Re-excision of right lumpectomy, close posterior margin.    Social History  Socioeconomic History   Marital status: Married    Spouse name: Danisha Brassfield   Number of children: 4   Years of education: 12   Highest education level: 12th grade  Occupational History    Employer: [employer]    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[employer]  Tobacco Use   Smoking status: Former    Current packs/day: 0.00    Average packs/day: 0.5 packs/day for 8.0 years (4.0 ttl pk-yrs)    Types: Cigarettes    Start date: 03/15/1996    Quit date: 03/15/2004    Years since quitting: 19.8    Passive exposure: Past   Smokeless tobacco: Never  Vaping Use   Vaping status: Never Used  Substance and Sexual Activity   Alcohol use: No   Drug use: No    Comment:  quit smoking 11 years ago   Sexual activity: Yes    Birth control/protection: Post-menopausal  Other Topics Concern   Not on file  Social History Narrative   1 daughter and 3 sons   5 grandchildren   Social Drivers of Corporate Investment Banker Strain: Low Risk  (01/12/2024)   Overall Financial Resource Strain (CARDIA)    Difficulty of Paying Living Expenses: Not hard at all  Food Insecurity: No Food Insecurity (01/12/2024)   Hunger Vital Sign    Worried About Running Out of Food in the Last Year: Never true    Ran Out of Food in the Last Year: Never true  Transportation Needs: No Transportation Needs (01/12/2024)   PRAPARE - Administrator, Civil Service (Medical): No    Lack of Transportation (Non-Medical): No  Physical Activity: Inactive (01/12/2024)   Exercise Vital Sign    Days of Exercise per Week: 0 days    Minutes of Exercise per Session: 0 min  Stress: No Stress Concern Present (01/12/2024)   [employer] of Occupational Health - Occupational Stress Questionnaire    Feeling of Stress: Not at all  Social Connections: Socially Integrated (01/12/2024)   Social Connection and Isolation Panel    Frequency of Communication with Friends and Family: More than three times a week    Frequency of Social Gatherings with Friends and Family: More than three times a week    Attends Religious Services: More than 4 times per year    Active Member of [employer] or Organizations: Yes    Attends Engineer, Structural: More than 4 times per year    Marital Status: Married     FAMILY HISTORY:  We obtained a detailed, 4-generation family history.  Significant diagnoses are listed below: Family History  Problem Relation Age of Onset   Crohn's disease Mother    Irritable bowel syndrome Mother    Hypertension Mother    Hyperlipidemia Mother    Stroke Mother    Thyroid  disease Mother    Depression Mother    Esophageal cancer Father 56   Cancer Father    Cancer  Maternal Uncle        unknown form of cancer   Breast cancer Paternal Aunt 22   Diabetes Maternal Grandmother    Breast cancer Paternal Grandmother 69   Diabetes Paternal Grandmother    Melanoma Maternal Cousin        maternal cousin dx in her 97s   Breast cancer Other        PGM's sister   Liver disease Other    Colon cancer Neg Hx    Rectal cancer Neg  Hx    Stomach cancer Neg Hx    Colon polyps Neg Hx      The significant family history of cancer reported included:  Personal history of breast cancer at 50 Paternal aunt with breast cancer in her early 2's Paternal grandmother with breast cancer at 67 Paternal grandmother's sister with breast cancer Maternal cousin with melanoma  Amanda Hoover is unaware of previous family history of genetic testing for hereditary cancer risks.  There is no reported Ashkenazi Jewish ancestry. There is no known consanguinity.  GENETIC COUNSELING ASSESSMENT: Amanda Hoover is a 68 y.o. female with a personal and family history of breast cancer which is somewhat suggestive of a hereditary cancer syndrome and predisposition to cancer given the number of women in the family with breast cancer. We, therefore, discussed and recommended the following at today's visit.   DISCUSSION: We discussed that, in general, most cancer is not inherited in families, but instead is sporadic or familial. Sporadic cancers occur by chance and typically happen at older ages (>50 years) as this type of cancer is caused by genetic changes acquired during an individual's lifetime. Some families have more cancers than would be expected by chance; however, the ages or types of cancer are not consistent with a known genetic mutation or known genetic mutations have been ruled out. This type of familial cancer is thought to be due to a combination of multiple genetic, environmental, hormonal, and lifestyle factors. While this combination of factors likely increases the risk of cancer, the  exact source of this risk is not currently identifiable or testable.  We discussed that 5 - 10% of breast cancer is hereditary, with most cases associated with BRCA mutations.  There are other genes that can be associated with hereditary breast cancer syndromes.  These include ATM, CHEK2 and PALB2.  The patient underwent genetic testing greater than 10 years ago and it was negative at that time.  We discussed that there are additional genes associated with breast cancer that we now regularly test for.  Additionally, we did not test for genes associated with melanoma at that time.  Based on the length of time from when she originally underwent genetic testing, we can repeat this testing for more updated testing.  We discussed that testing is beneficial for several reasons including knowing how to follow individuals after completing their treatment, identifying whether potential treatment options such as PARP inhibitors would be beneficial, and understand if other family members could be at risk for cancer and allow them to undergo genetic testing.   We reviewed the characteristics, features and inheritance patterns of hereditary cancer syndromes. We also discussed genetic testing, including the appropriate family members to test, the process of testing, insurance coverage and turn-around-time for results. We discussed the implications of a negative, positive, carrier and/or variant of uncertain significant result. Amanda Hoover  was offered a common hereditary cancer panel (36+ genes) and an expanded pan-cancer panel (70+ genes). Amanda Hoover was informed of the benefits and limitations of each panel, including that expanded pan-cancer panels contain genes that do not have clear management guidelines at this point in time.  We also discussed that as the number of genes included on a panel increases, the chances of variants of uncertain significance increases. Amanda Hoover decided to pursue genetic testing for the  CancerNext-Expanded+RNAinsight gene panel.   The CancerNext-Expanded gene panel offered by [facility] and includes sequencing, rearrangement, and RNA analysis for the following 77 genes: AIP, ALK, APC, ATM,  BAP1, BARD1, BMPR1A, BRCA1, BRCA2, BRIP1, CDC73, CDH1, CDK4, CDKN1B, CDKN2A, CEBPA, CHEK2, CTNNA1, DDX41, DICER1, ETV6, FH, FLCN, GATA2, LZTR1, MAX, MBD4, MEN1, MET, MLH1, MSH2, MSH3, MSH6, MUTYH, NF1, NF2, NTHL1, PALB2, PHOX2B, PMS2, POT1, PRKAR1A, PTCH1, PTEN, RAD51C, RAD51D, RB1, RET, RPS20, RUNX1, SDHA, SDHAF2, SDHB, SDHC, SDHD, SMAD4, SMARCA4, SMARCB1, SMARCE1, STK11, SUFU, TMEM127, TP53, TSC1, TSC2, VHL, and WT1 (sequencing and deletion/duplication); AXIN2, CTNNA1, DDX41, EGFR, HOXB13, KIT, MBD4, MITF, MSH3, PDGFRA, POLD1 and POLE (sequencing only); EPCAM and GREM1 (deletion/duplication only). RNA data is routinely analyzed for use in variant interpretation for all genes.   Based on Amanda Hoover's personal and family history of cancer, she meets medical criteria for genetic testing.  Despite that she meets criteria, she may still have an out of pocket cost. We discussed that if her out of pocket cost for testing is over $100, the laboratory will call and confirm whether she wants to proceed with testing.  If the out of pocket cost of testing is less than $100 she will be billed by the genetic testing laboratory.   PLAN: After considering the risks, benefits, and limitations, Amanda Hoover provided informed consent to pursue genetic testing and the blood sample was sent to [facility] for analysis of the CancerNext-Expanded+RNAinsight. Results should be available within approximately 2-3 weeks' time, at which point they will be disclosed by telephone to Amanda Hoover, as will any additional recommendations warranted by these results. Amanda Hoover will receive a summary of her genetic counseling visit and a copy of her results once available. This information will also be available in Epic.    Lastly, we encouraged Amanda Hoover to remain in contact with cancer genetics annually so that we can continuously update the family history and inform her of any changes in cancer genetics and testing that may be of benefit for this family.   Amanda Hoover questions were answered to her satisfaction today. Our contact information was provided should additional questions or concerns arise. Thank you for the referral and allowing us  to share in the care of your patient.   Jocelyn Nold P. Perri, MS, CGC Licensed, Patent Attorney [email or web address] phone: [phone]  I personally spent a total of 20 minutes in the care of the patient today including getting/reviewing separately obtained history, counseling and educating, placing orders, and documenting clinical information in the EHR. The patient brought her husband. Drs. Lanny Stalls, and/or Gudena were available for questions, if needed..    _______________________________________________________________________ For Office Staff:  Number of people involved in session: 2 Was an Intern/ student involved with case: no

## 2024-02-02 ENCOUNTER — Ambulatory Visit: Payer: Self-pay

## 2024-02-02 NOTE — Telephone Encounter (Signed)
 FYI Only or Action Required?: FYI only for provider: appointment scheduled on 02/20/24.  Patient was last seen in primary care on 11/22/2023 by Duanne Butler DASEN, MD.  Called Nurse Triage reporting Rectal Bleeding.  Symptoms began Unsure.  Interventions attempted: Other: n/a.  Symptoms are: stable.  Triage Disposition: No disposition on file.  Patient/caregiver understands and will follow disposition?:   Patient/caller partially completed triage.  Reason for refusal: Wanted to schedule appointment . Reason for Disposition  Requesting regular office appointment  Answer Assessment - Initial Assessment Questions 1. REASON FOR CALL: What is the main reason for your call? or How can I best help you?     Patient looking to make an appointment, denies triage  2. SYMPTOMS : Do you have any symptoms?      Osteoporosis medication causing some issues , wrist, ankles and hip hurting, blood in rectum when going to the bathroom. Purple spots on skin  Protocols used: Information Only Call - No Triage-A-AH  Copied from CRM [id number]. Topic: Clinical - Red Word Triage >> Feb 02, 2024  2:49 PM Ivette P wrote: [facility] that prompted transfer to Nurse Triage: medication prescribed isnt agreeing  tkaing for osteoporosis   wrist ankles and hip   had blood in rectum when going to restroom

## 2024-02-06 ENCOUNTER — Telehealth: Payer: Self-pay

## 2024-02-06 ENCOUNTER — Telehealth: Payer: Self-pay | Admitting: Genetic Counselor

## 2024-02-06 DIAGNOSIS — Z1379 Encounter for other screening for genetic and chromosomal anomalies: Secondary | ICD-10-CM | POA: Insufficient documentation

## 2024-02-06 NOTE — Telephone Encounter (Signed)
 Copied from CRM #[id number]. Topic: Clinical - Lab/Test Results >> Feb 06, 2024 12:37 PM Gustabo D wrote: Pt is returning a missed call

## 2024-02-06 NOTE — Telephone Encounter (Signed)
 I contacted  Amanda Hoover to discuss her genetic testing results. No pathogenic variants were identified in the 77 genes analyzed. A VUS in ETV6 was identified. Discussed that we do not know why she had breast cancer or why there is cancer in the family. It could be due to a different gene that we are not testing, or maybe our current technology may not be able to pick something up.  It will be important for her to keep in contact with genetics to keep up with whether additional testing may be needed. Detailed clinic note to follow.   The test report will be scanned into EPIC and will be located under the Molecular Pathology section of the Results Review tab.  A portion of the result report is included below for reference.

## 2024-02-06 NOTE — Telephone Encounter (Signed)
 Called to give Pt. Dr. Clara advice, no answer, and mailbox was full. Will try again later.

## 2024-02-06 NOTE — Telephone Encounter (Signed)
 Spoke to pt. By phone and gave Dr. Clara advice. She understood and will follow up at next appointment.

## 2024-02-07 ENCOUNTER — Other Ambulatory Visit: Payer: Self-pay | Admitting: Family Medicine

## 2024-02-08 ENCOUNTER — Ambulatory Visit: Payer: Self-pay | Admitting: Genetic Counselor

## 2024-02-08 DIAGNOSIS — Z1379 Encounter for other screening for genetic and chromosomal anomalies: Secondary | ICD-10-CM

## 2024-02-08 NOTE — Telephone Encounter (Signed)
 Requested Prescriptions  Pending Prescriptions Disp Refills   pantoprazole  (PROTONIX ) 40 MG tablet [Pharmacy Med Name: PANTOPRAZOLE  40MG  TABLETS] 90 tablet 0    Sig: TAKE 1 TABLET(40 MG) BY MOUTH DAILY     Gastroenterology: Proton Pump Inhibitors Passed - 02/08/2024 11:24 AM      Passed - Valid encounter within last 12 months    Recent Outpatient Visits           2 months ago Pure hypercholesterolemia   [place] [facility] Duanne Butler DASEN, MD   5 months ago Viral upper respiratory tract infection   [place] [facility] Duanne Butler DASEN, MD   5 months ago Acute conjunctivitis of left eye, unspecified acute conjunctivitis type   [place] [facility] Aletha Bene, MD   6 months ago Leg swelling   [place] [facility] Duanne, Butler DASEN, MD   9 months ago Age related osteoporosis, unspecified pathological fracture presence   [place] [facility] Pickard, Butler DASEN, MD               levothyroxine  (SYNTHROID ) 100 MCG tablet [Pharmacy Med Name: LEVOTHYROXINE  0.100MG  ( ) TAB] 90 tablet 0    Sig: TAKE 1 TABLET(100 MCG) BY MOUTH DAILY     Endocrinology:  Hypothyroid Agents Passed - 02/08/2024 11:24 AM      Passed - TSH in normal range and within 360 days    TSH  Date Value Ref Range Status  11/22/2023 2.83 0.40 - 4.50 mIU/L Final         Passed - Valid encounter within last 12 months    Recent Outpatient Visits           2 months ago Pure hypercholesterolemia   [place] [facility] Duanne Butler DASEN, MD   5 months ago Viral upper respiratory tract infection   [place] [facility] Duanne Butler DASEN, MD   5 months ago Acute conjunctivitis of left eye, unspecified acute conjunctivitis type   [place] [facility] Aletha Bene, MD   6 months ago Leg swelling   [place] [facility] Duanne,  Butler DASEN, MD   9 months ago Age related osteoporosis, unspecified pathological fracture presence   [place] [facility] Pickard, Butler DASEN, MD

## 2024-02-08 NOTE — Progress Notes (Signed)
 HPI:  [name] was previously seen in the [facility] due to a personal and family history of cancer and concerns regarding a hereditary predisposition to cancer. Please refer to our prior cancer genetics clinic note for more information regarding our discussion, assessment and recommendations, at the time. [name] recent genetic test results were disclosed to her, as were recommendations warranted by these results. These results and recommendations are discussed in more detail below.  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 Report Date is 02/01/2024 ETV6 p.R163M VUS  The CancerNext-Expanded gene panel offered by [facility] and includes sequencing, rearrangement, and RNA analysis for the following 77 genes: AIP, ALK, APC, ATM, BAP1, BARD1, BMPR1A, BRCA1, BRCA2, BRIP1, CDC73, CDH1, CDK4, CDKN1B, CDKN2A, CEBPA, CHEK2, CTNNA1, DDX41, DICER1, ETV6, FH, FLCN, GATA2, LZTR1, MAX, MBD4, MEN1, MET, MLH1, MSH2, MSH3, MSH6, MUTYH, NF1, NF2, NTHL1, PALB2, PHOX2B, PMS2, POT1, PRKAR1A, PTCH1, PTEN, RAD51C, RAD51D, RB1, RET, RPS20, RUNX1, SDHA, SDHAF2, SDHB, SDHC, SDHD, SMAD4, SMARCA4, SMARCB1, SMARCE1, STK11, SUFU, TMEM127, TP53, TSC1, TSC2, VHL, and WT1 (sequencing and deletion/duplication); AXIN2, CTNNA1, DDX41, EGFR, HOXB13, KIT, MBD4, MITF, MSH3, PDGFRA, POLD1 and POLE (sequencing only); EPCAM and GREM1 (deletion/duplication only). RNA data is routinely analyzed for use in variant interpretation for all genes.      FAMILY HISTORY:  We obtained a detailed, 4-generation family history.  Significant diagnoses are listed below: Family History  Problem Relation Age of Onset   Crohn's disease Mother    Irritable bowel syndrome Mother    Hypertension Mother     Hyperlipidemia Mother    Stroke Mother    Thyroid  disease Mother    Depression Mother    Esophageal cancer Father 69   Cancer Father    Cancer Maternal Uncle        unknown form of cancer   Breast cancer Paternal Aunt 89   Diabetes Maternal Grandmother    Breast cancer Paternal Grandmother 54   Diabetes Paternal Grandmother    Melanoma Maternal Cousin        maternal cousin dx in her 31s   Breast cancer Other        PGM's sister   Liver disease Other    Colon cancer Neg Hx    Rectal cancer Neg Hx    Stomach cancer Neg Hx    Colon polyps Neg Hx        The significant family history of cancer reported included:   Personal history of breast cancer at 18 Paternal aunt with breast cancer in her early 62's Paternal grandmother with breast cancer at 54 Paternal grandmother's sister with breast cancer Maternal cousin with melanoma   [name] is unaware of previous family history of genetic testing for hereditary cancer risks.  There is no reported Ashkenazi Jewish ancestry. There is no known consanguinity.  GENETIC TEST RESULTS: Genetic testing reported out on February 01, 2024 through the CancerNext-Expanded+RNAinsight cancer panel found no pathogenic mutations. The CancerNext-Expanded gene panel offered by [facility] and includes sequencing, rearrangement, and RNA analysis for the following 77 genes: AIP, ALK, APC, ATM, BAP1, BARD1, BMPR1A, BRCA1, BRCA2, BRIP1, CDC73, CDH1, CDK4, CDKN1B, CDKN2A, CEBPA, CHEK2, CTNNA1, DDX41, DICER1, ETV6, FH, FLCN, GATA2, LZTR1, MAX, MBD4, MEN1, MET, MLH1, MSH2, MSH3, MSH6, MUTYH, NF1, NF2, NTHL1, PALB2, PHOX2B,  PMS2, POT1, PRKAR1A, PTCH1, PTEN, RAD51C, RAD51D, RB1, RET, RPS20, RUNX1, SDHA, SDHAF2, SDHB, SDHC, SDHD, SMAD4, SMARCA4, SMARCB1, SMARCE1, STK11, SUFU, TMEM127, TP53, TSC1, TSC2, VHL, and WT1 (sequencing and deletion/duplication); AXIN2, CTNNA1, DDX41, EGFR, HOXB13, KIT, MBD4, MITF, MSH3, PDGFRA, POLD1 and POLE (sequencing only); EPCAM and  GREM1 (deletion/duplication only). RNA data is routinely analyzed for use in variant interpretation for all genes. The test report has been scanned into EPIC and is located under the Molecular Pathology section of the Results Review tab.  A portion of the result report is included below for reference.     We discussed with [name] that because current genetic testing is not perfect, it is possible there may be a gene mutation in one of these genes that current testing cannot detect, but that chance is small.  We also discussed, that there could be another gene that has not yet been discovered, or that we have not yet tested, that is responsible for the cancer diagnoses in the family. It is also possible there is a hereditary cause for the cancer in the family that [name] did not inherit and therefore was not identified in her testing.  Therefore, it is important to remain in touch with cancer genetics in the future so that we can continue to offer [name] the most up to date genetic testing.   Genetic testing did identify a variant of uncertain significance (VUS) was identified in the ETV6 gene called p.R163M (c.488G>T).  At this time, it is unknown if this variant is associated with increased cancer risk or if this is a normal finding, but most variants such as this get reclassified to being inconsequential. It should not be used to make medical management decisions. With time, we suspect the lab will determine the significance of this variant, if any. If we do learn more about it, we will try to contact [name] to discuss it further. However, it is important to stay in touch with us  periodically and keep the address and phone number up to date.  ADDITIONAL GENETIC TESTING: We discussed with [name] that her genetic testing was fairly extensive.  If there are genes identified to increase cancer risk that can be analyzed in the future, we would be happy to discuss and coordinate this testing  at that time.    CANCER SCREENING RECOMMENDATIONS: [name]'s test result is considered negative (normal).  This means that we have not identified a hereditary cause for her personal and family history of cancer at this time. Most cancers happen by chance and this negative test suggests that her personal and family history of cancer may fall into this category.    Possible reasons for [name]'s negative genetic test include:  1. There may be a gene mutation in one of these genes that current testing methods cannot detect but that chance is small.  2. There could be another gene that has not yet been discovered, or that we have not yet tested, that is responsible for the cancer diagnoses in the family.  3.  There may be no hereditary risk for cancer in the family. The cancers in [name] and/or her family may be sporadic/familial or due to other genetic and environmental factors. 4. It is also possible there is a hereditary cause for the cancer in the family that [name] did not inherit.  Therefore, it is recommended she continue to follow the cancer management and screening guidelines provided by her oncology and primary  healthcare provider. An individual's cancer risk and medical management are not determined by genetic test results alone. Overall cancer risk assessment incorporates additional factors, including personal medical history, family history, and any available genetic information that may result in a personalized plan for cancer prevention and surveillance  RECOMMENDATIONS FOR FAMILY MEMBERS:   Since she did not inherit a identifiable mutation in a cancer predisposition gene included on this panel, her children could not have inherited a known mutation from her in one of these genes. Individuals in this family might be at some increased risk of developing cancer, over the general population risk, simply due to the family history of cancer.  We recommended women in this family have  a yearly mammogram beginning at age 100, or 62 years younger than the earliest onset of cancer, an annual clinical breast exam, and perform monthly breast self-exams. Women in this family should also have a gynecological exam as recommended by their primary provider. All family members should be referred for colonoscopy starting at age 52, or 60 years younger than the earliest onset of cancer.  FOLLOW-UP: Lastly, we discussed with [name] that cancer genetics is a rapidly advancing field and it is possible that new genetic tests will be appropriate for her and/or her family members in the future. We encouraged her to remain in contact with cancer genetics on an annual basis so we can update her personal and family histories and let her know of advances in cancer genetics that may benefit this family.   Our contact number was provided. [name] questions were answered to her satisfaction, and she knows she is welcome to call us  at anytime with additional questions or concerns.   Amanda Monte, MS, [facility] Licensed, Certified Genetic Counselor [email or web address]

## 2024-02-14 ENCOUNTER — Ambulatory Visit: Admitting: Family Medicine

## 2024-02-14 ENCOUNTER — Encounter: Payer: Self-pay | Admitting: Family Medicine

## 2024-02-14 VITALS — BP 132/70 | HR 93 | Temp 98.3°F | Ht 64.0 in

## 2024-02-14 DIAGNOSIS — L920 Granuloma annulare: Secondary | ICD-10-CM

## 2024-02-14 DIAGNOSIS — K625 Hemorrhage of anus and rectum: Secondary | ICD-10-CM

## 2024-02-14 NOTE — Progress Notes (Signed)
 Subjective:    Patient ID: Amanda Hoover, female    DOB: Jan 23, 1956, 68 y.o.   MRN: [id number]  HPI  Had colonoscopy 2/24: Findings: - The perianal and digital rectal examinations were normal. - Four sessile polyps were found in the rectum ( 1) and transverse colon ( 3) . The polyps were 6 to 7 mm in size. These polyps were removed with a cold snare. Resection and retrieval were complete. - Internal hemorrhoids were found during retroflexion. The hemorrhoids were small and Grade I ( internal hemorrhoids that do not prolapse) . - The exam was otherwise normal throughout the examined colon.  Biopsies revealed 1 tubular adenoma (repeat in 7 years).  Patient states that every 2 to 3 months she will have bright red blood per rectum.  She states that is painless.  It will turn the water red inside the commode.  It will happen once and then it will not occur again.  She denies any pain with defecation.  My nurse was a chaperone with me for a rectal exam today.  There were no external hemorrhoids.  There was no evidence of a fissure.  On rectal exam there was no rectal masses appreciated.  There was no pain.  I did not appreciate any large internal hemorrhoid.  Rectal exam was normal.  Patient also has a serpiginous red macular rash on her abdomen and on her thighs.  She states that has been there for quite some time.  It does not itch.  It does not change.  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   Breast cancer, right (HCC)    ER 0 PR0 LN neg.   Chest pain    a. false positive NST in 10/2020 with cath in 11/2020 showing only minimal CAD with 10% mid-LAD stenosis.   Chronic headaches    Chronic ITP (idiopathic thrombocytopenia) (HCC)    mild thrombocytopenia, normal spleen on us  11/18, mild cirrhosis   Cirrhosis (HCC)     Dental crowns present    Depression    Family history of breast cancer    Family history of melanoma    Fatty liver    Genetic testing 02/06/2024   Negative testing. Report Date is 02/01/2024 ETV6 p.R163M VUS  The CancerNext-Expanded gene panel offered by [facility] and includes sequencing, rearrangement, and RNA analysis for the following 77 genes: AIP, ALK, APC, ATM, BAP1, BARD1, BMPR1A, BRCA1, BRCA2, BRIP1, CDC73, CDH1, CDK4, CDKN1B, CDKN2A, CEBPA, CHEK2, CTNNA1, DDX41, DICER1, ETV6, FH, FLCN, GATA2, LZTR1, MAX, MBD4, MEN1, MET, ML*   GERD (gastroesophageal reflux disease)    Heart murmur    Hiatal hernia    History of colon polyps    History of esophageal stricture    Status post esophageal dilatation   History of radiation therapy 03/28/2012-05/17/2012   60.4 gray to right breast   Hyperlipidemia    Hypothyroid    Multiple environmental allergies    Multiple food allergies    Osteopenia    Osteoporosis    Palpitations    Personal history of radiation therapy 2013   Right Breast Cancer   Rash 01/06/2012   right elbow   Rhinitis, allergic    rhinitis   Past Surgical History:  Procedure Laterality Date   BREAST LUMPECTOMY Right 2013   COLONOSCOPY  COLONOSCOPY W/ POLYPECTOMY     ESOPHAGOGASTRODUODENOSCOPY     LEFT HEART CATH AND CORONARY ANGIOGRAPHY N/A 11/19/2020   Procedure: LEFT HEART CATH AND CORONARY ANGIOGRAPHY;  Surgeon: Burnard Debby LABOR, MD;  Location: [facility];  Service: Cardiovascular;  Laterality: N/A;   MASTECTOMY Right 2013   partial mast   PARTIAL MASTECTOMY WITH NEEDLE LOCALIZATION AND AXILLARY SENTINEL LYMPH NODE BX  01/12/2012   Procedure: PARTIAL MASTECTOMY WITH NEEDLE LOCALIZATION AND AXILLARY SENTINEL LYMPH NODE BX;  Surgeon: Elon CHRISTELLA Pacini, MD;  Location: [facility];  Service: General;  Laterality: Right;   RE-EXCISION OF BREAST CANCER,SUPERIOR MARGINS  02/15/2012   Procedure: RE-EXCISION OF BREAST CANCER,SUPERIOR  MARGINS;  Surgeon: Elon CHRISTELLA Pacini, MD;  Location: [facility];  Service: General;  Laterality: Right;  Re-excision of right lumpectomy, close posterior margin.   Current Outpatient Medications on File Prior to Visit  Medication Sig Dispense Refill   alendronate  (FOSAMAX ) 70 MG tablet Take 1 tablet (70 mg total) by mouth every 7 (seven) days. Take with a full glass of water on an empty stomach. 4 tablet 11   ALPRAZolam  (XANAX ) 0.5 MG tablet Take 1 tablet (0.5 mg total) by mouth 2 (two) times daily as needed for anxiety. 30 tablet 0   ascorbic acid (VITAMIN C) 500 MG tablet Take 500 mg by mouth daily.     atorvastatin  (LIPITOR) 20 MG tablet TAKE 1 TABLET(20 MG) BY MOUTH DAILY 90 tablet 3   CALCIUM -VITAMIN D  PO Take by mouth.     furosemide  (LASIX ) 20 MG tablet Take 1 tablet (20 mg total) by mouth daily. 30 tablet 3   levothyroxine  (SYNTHROID ) 100 MCG tablet TAKE 1 TABLET(100 MCG) BY MOUTH DAILY 90 tablet 0   Methylcobalamin (B12-ACTIVE PO) Take 1 tablet by mouth daily.     MILK THISTLE PO Take by mouth.     moxifloxacin  (VIGAMOX ) 0.5 % ophthalmic solution Place 1 drop into the left eye 3 (three) times daily. 3 mL 1   Multiple Vitamin (MULTIVITAMIN) tablet Take 1 tablet by mouth daily.     pantoprazole  (PROTONIX ) 40 MG tablet TAKE 1 TABLET(40 MG) BY MOUTH DAILY 90 tablet 0   phentermine  (ADIPEX-P ) 37.5 MG tablet Take 1 tablet (37.5 mg total) by mouth daily before breakfast. 30 tablet 2   VITAMIN D  PO Take 1 capsule by mouth daily.     zinc gluconate 50 MG tablet Take 50 mg by mouth daily.     No current facility-administered medications on file prior to visit.   Allergies  Allergen Reactions   Macadamia Nut Oil Anaphylaxis   Other Swelling    POPCORN:  SWELLING THROAT   Peanut-Containing Drug Products Swelling and Other (See Comments)    THROAT TIGHTNESS; ALSO WALNUTS   Pistachio Nut (Diagnostic) Anaphylaxis   Tomato Swelling    SWELLING UVULA   Adhesive [Tape] Other (See  Comments)    BLISTERS   Flour Other (See Comments)    SNEEZING   Penicillins Hives   Azithromycin  Swelling    Per pt lips swells up   Oxycodone      Unknown reaction   Sulfa Antibiotics Nausea Only   Vicodin [Hydrocodone -Acetaminophen ] Hives and Swelling    Pt had flushing, then itching and swelling of lips   Soap Other (See Comments)    FRAGRANCES (SOAP/LOTION/PERFUME):  HEADACHE, RUNNY NOSE   Tramadol  Nausea And Vomiting   Social History   Socioeconomic History   Marital status: Married  Spouse name: Sheriece Jefcoat   Number of children: 4   Years of education: 12   Highest education level: Some college, no degree  Occupational History    Employer: [employer]    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[employer]  Tobacco Use   Smoking status: Former    Current packs/day: 0.00    Average packs/day: 0.5 packs/day for 8.0 years (4.0 ttl pk-yrs)    Types: Cigarettes    Start date: 03/15/1996    Quit date: 03/15/2004    Years since quitting: 19.9    Passive exposure: Past   Smokeless tobacco: Never  Vaping Use   Vaping status: Never Used  Substance and Sexual Activity   Alcohol use: No   Drug use: No    Comment: quit smoking 11 years ago   Sexual activity: Yes    Birth control/protection: Post-menopausal  Other Topics Concern   Not on file  Social History Narrative   1 daughter and 3 sons   5 grandchildren   Social Drivers of Corporate Investment Banker Strain: Low Risk  (02/13/2024)   Overall Financial Resource Strain (CARDIA)    Difficulty of Paying Living Expenses: Not hard at all  Food Insecurity: No Food Insecurity (02/13/2024)   Hunger Vital Sign    Worried About Running Out of Food in the Last Year: Never true    Ran Out of Food in the Last Year: Never true  Transportation Needs: No Transportation Needs (02/13/2024)   PRAPARE - Administrator, Civil Service (Medical): No    Lack of Transportation (Non-Medical): No  Physical Activity: Insufficiently Active  (02/13/2024)   Exercise Vital Sign    Days of Exercise per Week: 2 days    Minutes of Exercise per Session: 30 min  Stress: Stress Concern Present (02/13/2024)   [employer] of Occupational Health - Occupational Stress Questionnaire    Feeling of Stress: To some extent  Social Connections: Socially Integrated (02/13/2024)   Social Connection and Isolation Panel    Frequency of Communication with Friends and Family: More than three times a week    Frequency of Social Gatherings with Friends and Family: More than three times a week    Attends Religious Services: More than 4 times per year    Active Member of [employer] or Organizations: Yes    Attends Engineer, Structural: More than 4 times per year    Marital Status: Married  Catering Manager Violence: Not At Risk (01/12/2024)   Humiliation, Afraid, Rape, and Kick questionnaire    Fear of Current or Ex-Partner: No    Emotionally Abused: No    Physically Abused: No    Sexually Abused: No      Review of Systems     Objective:   Physical Exam Vitals reviewed.  Constitutional:      General: She is not in acute distress.    Appearance: She is obese. She is not toxic-appearing or diaphoretic.  HENT:     Nose: No mucosal edema.     Right Sinus: No maxillary sinus tenderness or frontal sinus tenderness.     Left Sinus: No maxillary sinus tenderness or frontal sinus tenderness.  Neck:     Thyroid : No thyroid  mass, thyromegaly or thyroid  tenderness.  Cardiovascular:     Rate and Rhythm: Normal rate and regular rhythm.     Heart sounds: Normal heart sounds. No murmur heard.    No friction rub. No gallop.  Pulmonary:  Effort: Pulmonary effort is normal. No respiratory distress.     Breath sounds: Normal breath sounds. No stridor. No wheezing or rales.  Genitourinary:    Rectum: No mass, tenderness, anal fissure, external hemorrhoid or internal hemorrhoid. Normal anal tone.  Musculoskeletal:     Cervical back: Full  passive range of motion without pain. No edema or erythema. No pain with movement or muscular tenderness. Normal range of motion.  Skin:    Findings: Erythema and rash present. Rash is macular.      Neurological:     Mental Status: She is alert.           Assessment & Plan:  Granuloma annulare  BRBPR (bright red blood per rectum) Given her normal colonoscopy last year, I suspect this is likely due to the internal hemorrhoids.  It is very sporadic.  If it becomes more consistent I would recommend a repeat colonoscopy but at the present time I reassured the patient that her rectal exam today is normal.  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  I would like to get a second opinion with dermatology

## 2024-02-17 ENCOUNTER — Telehealth: Payer: Self-pay | Admitting: Pediatrics

## 2024-02-17 ENCOUNTER — Other Ambulatory Visit: Payer: Self-pay

## 2024-02-17 DIAGNOSIS — R748 Abnormal levels of other serum enzymes: Secondary | ICD-10-CM

## 2024-02-17 DIAGNOSIS — K7460 Unspecified cirrhosis of liver: Secondary | ICD-10-CM

## 2024-02-17 NOTE — Telephone Encounter (Signed)
 Orders for labs are placed come any time or day Monday through Friday 7:30 am until 5:00 pm. No appointment needed. No fasting needed. Radiology Scheduling will call to set up the ultrasound. She may also call [phone] if she would like to schedule instead of waiting for the call.  No answer. Voicemail is full and cannot accept any messages.

## 2024-02-17 NOTE — Telephone Encounter (Signed)
 Dr Suzann pt POD B

## 2024-02-17 NOTE — Telephone Encounter (Signed)
 Patient calling in regards to 6 mons follow up on liver ultrasound and labs. Please advise.   Thank you

## 2024-02-17 NOTE — Telephone Encounter (Signed)
 Patient is due ultrasound of the liver and labs (?AFP). If that's okay, I will get her scheduled. Thanks

## 2024-02-17 NOTE — Telephone Encounter (Signed)
 Spoke with the patient. Moved her appointment with provider to 03/01/24 from 02/24/24 to allow time to receive imaging and lab results.

## 2024-02-20 ENCOUNTER — Ambulatory Visit: Admitting: Family Medicine

## 2024-02-21 ENCOUNTER — Ambulatory Visit (HOSPITAL_BASED_OUTPATIENT_CLINIC_OR_DEPARTMENT_OTHER)

## 2024-02-23 ENCOUNTER — Ambulatory Visit (HOSPITAL_BASED_OUTPATIENT_CLINIC_OR_DEPARTMENT_OTHER)

## 2024-02-24 ENCOUNTER — Ambulatory Visit: Admitting: Pediatrics

## 2024-02-24 ENCOUNTER — Other Ambulatory Visit

## 2024-02-24 ENCOUNTER — Ambulatory Visit (HOSPITAL_COMMUNITY)
Admission: RE | Admit: 2024-02-24 | Discharge: 2024-02-24 | Disposition: A | Source: Ambulatory Visit | Attending: Pediatrics | Admitting: Pediatrics

## 2024-02-24 DIAGNOSIS — R748 Abnormal levels of other serum enzymes: Secondary | ICD-10-CM

## 2024-02-24 DIAGNOSIS — K7460 Unspecified cirrhosis of liver: Secondary | ICD-10-CM | POA: Diagnosis not present

## 2024-02-24 LAB — CBC WITH DIFFERENTIAL/PLATELET
Basophils Absolute: 0 K/uL (ref 0.0–0.1)
Basophils Relative: 0.7 % (ref 0.0–3.0)
Eosinophils Absolute: 0.2 K/uL (ref 0.0–0.7)
Eosinophils Relative: 3 % (ref 0.0–5.0)
HCT: 42.5 % (ref 36.0–46.0)
Hemoglobin: 14.3 g/dL (ref 12.0–15.0)
Lymphocytes Relative: 27.1 % (ref 12.0–46.0)
Lymphs Abs: 1.4 K/uL (ref 0.7–4.0)
MCHC: 33.8 g/dL (ref 30.0–36.0)
MCV: 95.2 fl (ref 78.0–100.0)
Monocytes Absolute: 0.6 K/uL (ref 0.1–1.0)
Monocytes Relative: 11.3 % (ref 3.0–12.0)
Neutro Abs: 3 K/uL (ref 1.4–7.7)
Neutrophils Relative %: 57.9 % (ref 43.0–77.0)
Platelets: 152 K/uL (ref 150.0–400.0)
RBC: 4.47 Mil/uL (ref 3.87–5.11)
RDW: 14.7 % (ref 11.5–15.5)
WBC: 5.2 K/uL (ref 4.0–10.5)

## 2024-02-24 LAB — HEPATIC FUNCTION PANEL
ALT: 37 U/L — ABNORMAL HIGH (ref 0–35)
AST: 49 U/L — ABNORMAL HIGH (ref 0–37)
Albumin: 4.6 g/dL (ref 3.5–5.2)
Alkaline Phosphatase: 66 U/L (ref 39–117)
Bilirubin, Direct: 0.2 mg/dL (ref 0.0–0.3)
Total Bilirubin: 1 mg/dL (ref 0.2–1.2)
Total Protein: 7.9 g/dL (ref 6.0–8.3)

## 2024-02-24 LAB — PROTIME-INR
INR: 1.3 ratio — ABNORMAL HIGH (ref 0.8–1.0)
Prothrombin Time: 13.3 s — ABNORMAL HIGH (ref 9.6–13.1)

## 2024-02-26 ENCOUNTER — Ambulatory Visit: Payer: Self-pay | Admitting: Pediatrics

## 2024-02-27 LAB — AFP TUMOR MARKER: AFP-Tumor Marker: 6.1 ng/mL — ABNORMAL HIGH

## 2024-02-29 NOTE — Progress Notes (Unsigned)
 [facility] Return Visit   Referring Provider Amanda Amanda DASEN, MD [address] [place],  [place] [address]  Primary Care Provider Pickard, Amanda DASEN, MD  Patient Profile: Amanda Hoover is a 68 y.o. female with a past medical history noteworthy for breast cancer, chronic ITP, osteopenia, headaches who returns to the [facility] for follow-up of the problem(s) noted below.  Problem List: Compensated cirrhosis, secondary to MASH GERD History of colon polyps-tubular adenomas and sessile serrated polyps Cholelithiasis   History of Present Illness   Amanda Hoover was last seen in the GI office 04/22/2022 by Dr. Aneita   Current GI Meds  Pantoprazole  40 mg p.o. daily  Interval History   Compensated cirrhosis 2/2 MASH -- Laboratory evaluation for chronic hepatitides 12/2018 negative; noted to be hepatitis B surface antibody nonreactive -- Denies jaundice, dark urine, easy bruising, confusion -- States that she had an episode of rectal bleeding after taking frequent doses of Aleve which resolved with cessation of NSAID -- Endorses shortness of breath on exertion as well as a 12 pound weight gain over the last month -- Notes increased lower extremity edema that is dependent in nature -- Denies chest pain or chronic heart issues  -- AUS 11/02/2022 -cholelithiasis without cholecystitis; increased echotexture of the liver which is nonspecific but can be seen in the setting of hepatic steatosis/cirrhosis -- Due for repeat ultrasound, AFP, PT/INR -- No varices on EGD 04/2022 -repeat 2026-2027  GERD -- EGD 04/2022 - variable Z-line, no varices, small HH, small gastric polyps -- GERD symptoms well-controlled on pantoprazole  40 mg p.o. daily -- Denies regurgitation, pyrosis, dysphagia  History of colon polyps -- Colonoscopy 04/2022 - four 6-7 mm polyps (TA, HP), IH - repeat 3 years 04/2025  Cholelithiasis -- Incidental finding of cholelithiasis on  most recent ultrasound -- No history of biliary colic -- Reviewed that this does not require any intervention  GI Review of Symptoms Significant for none. Otherwise negative.  General Review of Systems  Review of systems is significant for the pertinent positives and negatives as listed per the HPI.  Full ROS is otherwise negative.  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   Breast cancer, right (HCC)    ER 0 PR0 LN neg.   Chest pain    a. false positive NST in 10/2020 with cath in 11/2020 showing only minimal CAD with 10% mid-LAD stenosis.   Chronic headaches    Chronic ITP (idiopathic thrombocytopenia) (HCC)    mild thrombocytopenia, normal spleen on us  11/18, mild cirrhosis   Cirrhosis (HCC)    Dental crowns present    Depression    Family history of breast cancer    Family history of melanoma    Fatty liver    Genetic testing 02/06/2024   Negative testing. Report Date is 02/01/2024 ETV6 [id number] VUS  The CancerNext-Expanded gene panel offered by [facility] and includes sequencing, rearrangement, and RNA analysis for the following 77 genes: AIP, ALK, APC, ATM, BAP1, BARD1, BMPR1A, BRCA1, BRCA2, BRIP1, CDC73, CDH1, CDK4, CDKN1B, CDKN2A, CEBPA, CHEK2, CTNNA1, DDX41, DICER1, ETV6, FH, FLCN, GATA2, LZTR1, MAX, MBD4, MEN1, MET, ML*   GERD (gastroesophageal reflux disease)    Heart murmur    Hiatal hernia    History of colon polyps  History of esophageal stricture    Status post esophageal dilatation   History of radiation therapy 03/28/2012-05/17/2012   60.4 gray to right breast   Hyperlipidemia    Hypothyroid    Multiple environmental allergies    Multiple food allergies    Osteopenia    Osteoporosis    Palpitations    Personal history of radiation therapy 2013   Right Breast Cancer   Rash 01/06/2012    right elbow   Rhinitis, allergic    rhinitis     Past Surgical History   Past Surgical History:  Procedure Laterality Date   BREAST LUMPECTOMY Right 2013   COLONOSCOPY     COLONOSCOPY W/ POLYPECTOMY     ESOPHAGOGASTRODUODENOSCOPY     LEFT HEART CATH AND CORONARY ANGIOGRAPHY N/A 11/19/2020   Procedure: LEFT HEART CATH AND CORONARY ANGIOGRAPHY;  Surgeon: Burnard Debby LABOR, MD;  Location: [facility];  Service: Cardiovascular;  Laterality: N/A;   MASTECTOMY Right 2013   partial mast   PARTIAL MASTECTOMY WITH NEEDLE LOCALIZATION AND AXILLARY SENTINEL LYMPH NODE BX  01/12/2012   Procedure: PARTIAL MASTECTOMY WITH NEEDLE LOCALIZATION AND AXILLARY SENTINEL LYMPH NODE BX;  Surgeon: Elon Hoover Pacini, MD;  Location: [facility];  Service: General;  Laterality: Right;   RE-EXCISION OF BREAST CANCER,SUPERIOR MARGINS  02/15/2012   Procedure: RE-EXCISION OF BREAST CANCER,SUPERIOR MARGINS;  Surgeon: Elon Hoover Pacini, MD;  Location:  SURGERY CENTER;  Service: General;  Laterality: Right;  Re-excision of right lumpectomy, close posterior margin.     Allergies and Medications   Allergies  Allergen Reactions   Macadamia Nut Oil Anaphylaxis   Other Swelling    POPCORN:  SWELLING THROAT   Peanut-Containing Drug Products Swelling and Other (See Comments)    THROAT TIGHTNESS; ALSO WALNUTS   Pistachio Nut (Diagnostic) Anaphylaxis   Tomato Swelling    SWELLING UVULA   Adhesive [Tape] Other (See Comments)    BLISTERS   Flour Other (See Comments)    SNEEZING   Penicillins Hives   Azithromycin  Swelling    Per pt lips swells up   Oxycodone      Unknown reaction   Sulfa Antibiotics Nausea Only   Vicodin [Hydrocodone -Acetaminophen ] Hives and Swelling    Pt had flushing, then itching and swelling of lips   Soap Other (See Comments)    FRAGRANCES (SOAP/LOTION/PERFUME):  HEADACHE, RUNNY NOSE   Tramadol  Nausea And Vomiting    No outpatient medications have been marked as  taking for the 03/01/24 encounter (Appointment) with Amanda Hoover, Amanda CHRISTELLA, MD.     Family History   Family History  Problem Relation Age of Onset   Crohn's disease Mother    Irritable bowel syndrome Mother    Hypertension Mother    Hyperlipidemia Mother    Stroke Mother    Thyroid  disease Mother    Depression Mother    Esophageal cancer Father 71   Cancer Father    Cancer Maternal Uncle        unknown form of cancer   Breast cancer Paternal Aunt 24   Diabetes Maternal Grandmother    Breast cancer Paternal Grandmother 47   Diabetes Paternal Grandmother    Melanoma Maternal Cousin        maternal cousin dx in her 19s   Breast cancer Other        PGM's sister   Liver disease Other    Colon cancer Neg Hx    Rectal cancer Neg Hx  Stomach cancer Neg Hx    Colon polyps Neg Hx     Social History   Social History   Tobacco Use   Smoking status: Former    Current packs/day: 0.00    Average packs/day: 0.5 packs/day for 8.0 years (4.0 ttl pk-yrs)    Types: Cigarettes    Start date: 03/15/1996    Quit date: 03/15/2004    Years since quitting: 19.9    Passive exposure: Past   Smokeless tobacco: Never  Vaping Use   Vaping status: Never Used  Substance Use Topics   Alcohol use: No   Drug use: No    Comment: quit smoking 11 years ago   Zoanne reports that she quit smoking about 19 years ago. Her smoking use included cigarettes. She started smoking about 27 years ago. She has a 4 pack-year smoking history. She has been exposed to tobacco smoke. She has never used smokeless tobacco. She reports that she does not drink alcohol and does not use drugs.  Vital Signs and Physical Examination   There were no vitals filed for this visit.  There is no height or weight on file to calculate BMI.    General: Well developed, well nourished, no acute distress Head: Normocephalic and atraumatic Eyes: Sclerae anicteric Lungs: Clear throughout to auscultation Heart: Regular rate and  rhythm; No murmurs, rubs or bruits Abdomen: Soft, non tender and non distended. No masses, hepatosplenomegaly or hernias noted. Normal Bowel sounds, no ascites appreciated on physical exam Rectal: Deferred Musculoskeletal: Symmetrical with no gross deformities  Extremities: Trace edema in bilateral lower extremities Neurological: Alert oriented x 4, grossly nonfocal Psychological:  Alert and cooperative. Normal mood and affect   Review of Data  The following data was reviewed at the time of this encounter:  Laboratory Studies      Latest Ref Rng & Units 02/24/2024    2:14 PM 11/22/2023    8:35 AM 07/14/2023    4:39 PM  CBC  WBC 4.0 - 10.5 K/uL 5.2  7.5  8.0   Hemoglobin 12.0 - 15.0 g/dL 85.6  85.4  85.5   Hematocrit 36.0 - 46.0 % 42.5  44.4  43.4   Platelets 150.0 - 400.0 K/uL 152.0  149  166     No results found for: LIPASE    Latest Ref Rng & Units 02/24/2024    2:14 PM 11/22/2023    8:35 AM 07/14/2023    4:39 PM  CMP  Glucose 65 - 99 mg/dL  898  92   BUN 7 - 25 mg/dL  15  11   Creatinine 9.49 - 1.05 mg/dL  8.98  8.92   Sodium 864 - 146 mmol/L  140  139   Potassium 3.5 - 5.3 mmol/L  4.4  3.9   Chloride 98 - 110 mmol/L  105  105   CO2 20 - 32 mmol/L  27  23   Calcium  8.6 - 10.4 mg/dL  9.5  9.6   Total Protein 6.0 - 8.3 g/dL 7.9  7.2  7.6   Total Bilirubin 0.2 - 1.2 mg/dL 1.0  0.8  0.7   Alkaline Phos 39 - 117 U/L 66     AST 0 - 37 U/L 49  46  59   ALT 0 - 35 U/L 37  39  45      Imaging Studies  AUS 02/27/2024 1. Cholelithiasis. No signs of cholecystitis. 2. Cirrhosis. No focal hepatic abnormality by ultrasound.  AUS 10/2022 1. Cholelithiasis  without sonographic evidence for acute cholecystitis. 2. Increased echotexture of the liver. This is nonspecific but can be seen in the setting of hepatic steatosis/cirrhosis.  AUS 04/28/2022 1. Cholelithiasis without sonographic evidence of acute cholecystitis. 2. Diffuse increased echotexture of the liver this can be seen in  cirrhosis of liver.  AUS 12/07/2019 1. Cirrhosis. No focal liver abnormality.   RUQ 05/29/2019 Stable appearance of the right upper quadrant of the abdomen with morphologic changes of hepatic cirrhosis. No acute findings or focal liver lesions identified.     GI Procedures and Studies  EGD/Colonoscopy 04/2022 EGD - variable Z-line, no varices, small HH, small gastric polyps Colonoscopy - four 6-7 mm polyps (TA, HP), IH Path: Reflux changes, fundic gland polyps, TA and HP  EGD 05/04/2019 Variable Z-line, multiple gastric polyps Path: Fundic gland polyps  Colonoscopy 03/03/2017 - Four 6 to 8 mm polyps in the sigmoid colon, in the descending colon and in the transverse colon, removed with a cold snare. Resected and retrieved.  - One 5 mm polyp in the ascending colon, removed with a cold biopsy forceps. Resected and retrieved.  - Internal hemorrhoids.  - The examination was otherwise normal on direct and retroflexion views. Path: TA, SSP, HP  EGD/Colonoscopy 12/14/2011 EGD -irregular Z-line, small HH Colonoscopy -2 sessile polyps in TC, sessile polyp in DC Path: Benign reflux changes, serrated adenoma and hyperplastic polyp  EGD 08/05/2004 Stricture in the distal esophagus Savary dilated to 17 mm Path: Reflux changes  Clinical Impression  It is my clinical impression that Ms. Urbas is a 68 y.o. female with;  Compensated cirrhosis, secondary to MASH GERD History of colon polyps-tubular adenomas and sessile serrated polyps Cholelithiasis  Ms. Gopaul presents for follow-up of compensated cirrhosis most likely secondary to [facility]; laboratory evaluation for chronic hepatitides in 12/2018 ruled out other etiologies.  During our interview today she endorsed increasing edema in her ankles bilaterally, shortness of breath with exertion and a 12 pound weight gain over the last month.  Laboratory studies obtained today are stable from a liver perspective.  Advised her to speak with her primary  care doctor to ensure immune new cardiac issues have evolved.  At today's visit I advised updating laboratory tests and HCC screening with an AFP and abdominal ultrasound.  She is up-to-date for esophageal varices screening in 2024 with no varices noted  GERD is currently stable on pantoprazole  40 mg orally daily and no changes will be made today.  She has a history of colonic tubular adenomas and sessile serrated polyps-last colonoscopy was performed in 2024 with 1 tubular adenoma noted.  Next colonoscopy due in 2031.  Plan  Labs today: PT, INR, AFP HCC screening: Schedule AUS ultrasound and AFP as above Esophageal varices screening: No varices due 2024 -next EGD due 2020 08-2025 Colon polyp surveillance: Next colonoscopy due 04/2029 GERD: Continue pantoprazole  40 mg orally daily As above, patient referred to PCP to discuss symptoms of increasing peripheral edema and shortness of breath   Planned Follow Up 6 months  The patient or caregiver verbalized understanding of the material covered, with no barriers to understanding. All questions were answered. Patient or caregiver is agreeable with the plan outlined above.    It was a pleasure to see [place].  If you have any questions or concerns regarding this evaluation, do not hesitate to contact me.  Amanda Hausen, MD [facility]

## 2024-03-01 ENCOUNTER — Encounter: Payer: Self-pay | Admitting: Pediatrics

## 2024-03-01 ENCOUNTER — Other Ambulatory Visit (INDEPENDENT_AMBULATORY_CARE_PROVIDER_SITE_OTHER)

## 2024-03-01 ENCOUNTER — Ambulatory Visit: Admitting: Pediatrics

## 2024-03-01 VITALS — BP 126/78 | HR 75 | Ht 64.0 in | Wt 239.4 lb

## 2024-03-01 DIAGNOSIS — K219 Gastro-esophageal reflux disease without esophagitis: Secondary | ICD-10-CM

## 2024-03-01 DIAGNOSIS — K7581 Nonalcoholic steatohepatitis (NASH): Secondary | ICD-10-CM

## 2024-03-01 DIAGNOSIS — K7460 Unspecified cirrhosis of liver: Secondary | ICD-10-CM

## 2024-03-01 DIAGNOSIS — K8020 Calculus of gallbladder without cholecystitis without obstruction: Secondary | ICD-10-CM | POA: Diagnosis not present

## 2024-03-01 DIAGNOSIS — Z860100 Personal history of colon polyps, unspecified: Secondary | ICD-10-CM

## 2024-03-01 DIAGNOSIS — Z860101 Personal history of adenomatous and serrated colon polyps: Secondary | ICD-10-CM | POA: Diagnosis not present

## 2024-03-01 DIAGNOSIS — K7469 Other cirrhosis of liver: Secondary | ICD-10-CM | POA: Diagnosis not present

## 2024-03-01 NOTE — Patient Instructions (Signed)
 Your provider has requested that you go to the basement level for lab work before leaving today. Press B on the elevator. The lab is located at the first door on the left as you exit the elevator.  Due to recent changes in healthcare laws, you may see the results of your imaging and laboratory studies on MyChart before your provider has had a chance to review them.  We understand that in some cases there may be results that are confusing or concerning to you. Not all laboratory results come back in the same time frame and the provider may be waiting for multiple results in order to interpret others.  Please give us  48 hours in order for your provider to thoroughly review all the results before contacting the office for clarification of your results.   Thank you for entrusting me with your care and for choosing [facility], Dr. Inocente Hausen  _______________________________________________________  If your blood pressure at your visit was 140/90 or greater, please contact your primary care physician to follow up on this.  _______________________________________________________  If you are age 70 or older, your body mass index should be between 23-30. Your Body mass index is 41.09 kg/m. If this is out of the aforementioned range listed, please consider follow up with your Primary Care Provider.  If you are age 70 or younger, your body mass index should be between 19-25. Your Body mass index is 41.09 kg/m. If this is out of the aformentioned range listed, please consider follow up with your Primary Care Provider.   ________________________________________________________  The [facility] providers would like to encourage you to use MYCHART to communicate with providers for non-urgent requests or questions.  Due to long hold times on the telephone, sending your provider a message by [facility] may be a faster and more efficient way to get a response.  Please allow 48 business hours for a  response.  Please remember that this is for non-urgent requests.  _______________________________________________________  [facility] is using a team-based approach to care.  Your team is made up of your doctor and two to three APPS. Our APPS (Nurse Practitioners and Physician Assistants) work with your physician to ensure care continuity for you. They are fully qualified to address your health concerns and develop a treatment plan. They communicate directly with your gastroenterologist to care for you. Seeing the Advanced Practice Practitioners on your physician's team can help you by facilitating care more promptly, often allowing for earlier appointments, access to diagnostic testing, procedures, and other specialty referrals.

## 2024-03-02 ENCOUNTER — Telehealth: Payer: Self-pay

## 2024-03-02 ENCOUNTER — Other Ambulatory Visit: Payer: Self-pay

## 2024-03-02 DIAGNOSIS — K7460 Unspecified cirrhosis of liver: Secondary | ICD-10-CM

## 2024-03-02 NOTE — Telephone Encounter (Signed)
-----   Message from Inocente Hausen, MD sent at 03/01/2024  8:16 PM EST ----- Regarding: Hepatitis A antibody Pod B -  Amanda Hoover had labs in clinic 03/01/2024 when I checked a hepatitis B surface antibody.  Please call the lab to see if there is the possibility of adding a hepatitis A antibody test to the same specimen.  If not, that is okay we can check it at a later date.  Thanks,  Inocente

## 2024-03-02 NOTE — Telephone Encounter (Signed)
 Spoke with [facility] & they've added on lab. Results should return in 3 days.

## 2024-03-02 NOTE — Telephone Encounter (Signed)
 Add on lab ordered & faxed to lab.

## 2024-03-04 ENCOUNTER — Ambulatory Visit: Payer: Self-pay | Admitting: Pediatrics

## 2024-03-04 LAB — HEPATITIS A ANTIBODY, TOTAL: Hepatitis A AB,Total: REACTIVE — AB

## 2024-03-04 LAB — TEST AUTHORIZATION

## 2024-03-04 LAB — HEPATITIS B SURFACE ANTIBODY,QUALITATIVE: Hep B S Ab: REACTIVE — AB

## 2024-03-06 ENCOUNTER — Ambulatory Visit: Payer: Self-pay | Admitting: Pediatrics

## 2024-03-20 ENCOUNTER — Ambulatory Visit: Payer: Self-pay

## 2024-03-20 NOTE — Telephone Encounter (Signed)
" °  FYI Only or Action Required?: FYI only for provider: appointment scheduled on 1/8.  Patient was last seen in primary care on 02/14/2024 by Duanne Butler DASEN, MD.  Called Nurse Triage reporting Sinusitis.  Symptoms began several weeks ago.  Interventions attempted: OTC medications: antihistamines.  Symptoms are: gradually worsening.  Triage Disposition: See PCP When Office is Open (Within 3 Days)  Patient/caregiver understands and will follow disposition?: Yes   Copied from CRM [id number]. Topic: Clinical - Red Word Triage >> Mar 20, 2024 11:51 AM Wess RAMAN wrote: Red Word that prompted transfer to Nurse Triage: Patient would like a virtual visit or medication called in. She believes she has a sinus infection. It hurts under her eyes, ears feeling full and is dizzy. She has been taking her old medication for vertigo. Has been experiencing this for 2 weeks   Pharmacy: [facility] - [place], [place] - [address] [place] [address] Phone: [phone] Fax: [phone] Hours: Not open 24 hours Reason for Disposition  [1] Sinus congestion (pressure, fullness) AND [2] present > 10 days  Answer Assessment - Initial Assessment Questions 1. LOCATION: Where does it hurt?      Forehead cheek 2. ONSET: When did the sinus pain start?  (e.g., hours, days)      12/26 3. SEVERITY: How bad is the pain?   (Scale 0-10; or none, mild, moderate or severe)     moderate 4. RECURRENT SYMPTOM: Have you ever had sinus problems before? If Yes, ask: When was the last time? and What happened that time?       5. NASAL CONGESTION: Is the nose blocked? If Yes, ask: Can you open it or must you breathe through your mouth?     yes 6. NASAL DISCHARGE: Do you have discharge from your nose? If so ask, What color?     Post nasal drip 7. FEVER: Do you have a fever? If Yes, ask: What is it, how was it measured, and when did it start?      denies 8. OTHER  SYMPTOMS: Do you have any other symptoms? (e.g., sore throat, cough, earache, difficulty breathing)     Mild dizziness, walking normally  Protocols used: Sinus Pain or Congestion-A-AH  "

## 2024-03-21 ENCOUNTER — Telehealth: Payer: Self-pay

## 2024-03-21 ENCOUNTER — Ambulatory Visit: Admitting: Family Medicine

## 2024-03-21 NOTE — Progress Notes (Deleted)
 "  Virtual Visit via Video Note  Assessment and Plan: No diagnosis found.  Follow Up Instructions: *** No follow-ups on file.   I discussed the assessment and treatment plan with the patient. The patient was provided an opportunity to ask questions, and all were answered. The patient agreed with the plan and demonstrated an understanding of the instructions.   The patient was advised to call back or seek an in-person evaluation if the symptoms worsen or if the condition fails to improve as anticipated.  The above assessment and management plan was discussed with the patient. The patient verbalized understanding of and has agreed to the management plan.   Connie Emperor, MD  I connected with Channing VEAR Roof on 03/21/2024 at  3:40 PM EST by a video enabled telemedicine application and verified that I am speaking with the correct person using two identifiers.  {Patient Location:[phone]::Home} {Provider Location:[phone]::Home Office}  I discussed the limitations, risks, security, and privacy concerns of performing an evaluation and management service by video and the availability of in person appointments. I also discussed with the patient that there may be a patient responsible charge related to this service. The patient expressed understanding and agreed to proceed.  Subjective: PCP: Duanne Butler DASEN, MD  No chief complaint on file.   ***   ROS: Per HPI Current Medications[1]  Social History   Socioeconomic History   Marital status: Married    Spouse name: Pensions Consultant   Number of children: 4   Years of education: 12   Highest education level: Some college, no degree  Occupational History    Employer: [employer]    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[employer]  Tobacco Use   Smoking status: Former    Current packs/day: 0.00    Average packs/day: 0.5 packs/day for 8.0 years (4.0 ttl pk-yrs)    Types: Cigarettes    Start date: 03/15/1996    Quit date: 03/15/2004     Years since quitting: 20.0    Passive exposure: Past   Smokeless tobacco: Never  Vaping Use   Vaping status: Never Used  Substance and Sexual Activity   Alcohol use: No   Drug use: No    Comment: quit smoking 11 years ago   Sexual activity: Yes    Birth control/protection: Post-menopausal  Other Topics Concern   Not on file  Social History Narrative   1 daughter and 3 sons   5 grandchildren   Social Drivers of Health   Tobacco Use: Medium Risk (03/01/2024)   Patient History    Smoking Tobacco Use: Former    Smokeless Tobacco Use: Never    Passive Exposure: Past  Physicist, Medical Strain: Low Risk (02/13/2024)   Overall Financial Resource Strain (CARDIA)    Difficulty of Paying Living Expenses: Not hard at all  Food Insecurity: No Food Insecurity (02/13/2024)   Epic    Worried About Programme Researcher, Broadcasting/film/video in the Last Year: Never true    Ran Out of Food in the Last Year: Never true  Transportation Needs: No Transportation Needs (02/13/2024)   Epic    Lack of Transportation (Medical): No    Lack of Transportation (Non-Medical): No  Physical Activity: Insufficiently Active (02/13/2024)   Exercise Vital Sign    Days of Exercise per Week: 2 days    Minutes of Exercise per Session: 30 min  Stress: Stress Concern Present (02/13/2024)   [employer] of Occupational Health - Occupational Stress Questionnaire    Feeling of  Stress: To some extent  Social Connections: Socially Integrated (02/13/2024)   Social Connection and Isolation Panel    Frequency of Communication with Friends and Family: More than three times a week    Frequency of Social Gatherings with Friends and Family: More than three times a week    Attends Religious Services: More than 4 times per year    Active Member of Clubs or Organizations: Yes    Attends Banker Meetings: More than 4 times per year    Marital Status: Married  Catering Manager Violence: Not At Risk (01/12/2024)   Epic    Fear of  Current or Ex-Partner: No    Emotionally Abused: No    Physically Abused: No    Sexually Abused: No  Depression (PHQ2-9): Low Risk (02/14/2024)   Depression (PHQ2-9)    PHQ-2 Score: 0  Recent Concern: Depression (PHQ2-9) - Medium Risk (01/12/2024)   Depression (PHQ2-9)    PHQ-2 Score: 6  Alcohol Screen: Low Risk (01/12/2024)   Alcohol Screen    Last Alcohol Screening Score (AUDIT): 0  Housing: Low Risk (02/13/2024)   Epic    Unable to Pay for Housing in the Last Year: No    Number of Times Moved in the Last Year: 0    Homeless in the Last Year: No  Utilities: Not At Risk (01/12/2024)   Epic    Threatened with loss of utilities: No  Health Literacy: Adequate Health Literacy (01/12/2024)   B1300 Health Literacy    Frequency of need for help with medical instructions: Never     Observations/Objective: There were no vitals filed for this visit. Physical Exam    [1]  Current Outpatient Medications:    alendronate  (FOSAMAX ) 70 MG tablet, Take 1 tablet (70 mg total) by mouth every 7 (seven) days. Take with a full glass of water on an empty stomach. (Patient not taking: Reported on 03/01/2024), Disp: 4 tablet, Rfl: 11   ALPRAZolam  (XANAX ) 0.5 MG tablet, Take 1 tablet (0.5 mg total) by mouth 2 (two) times daily as needed for anxiety., Disp: 30 tablet, Rfl: 0   ascorbic acid (VITAMIN C) 500 MG tablet, Take 500 mg by mouth daily., Disp: , Rfl:    atorvastatin  (LIPITOR) 20 MG tablet, TAKE 1 TABLET(20 MG) BY MOUTH DAILY, Disp: 90 tablet, Rfl: 3   CALCIUM -VITAMIN D  PO, Take by mouth., Disp: , Rfl:    furosemide  (LASIX ) 20 MG tablet, Take 1 tablet (20 mg total) by mouth daily. (Patient not taking: Reported on 03/01/2024), Disp: 30 tablet, Rfl: 3   levothyroxine  (SYNTHROID ) 100 MCG tablet, TAKE 1 TABLET(100 MCG) BY MOUTH DAILY, Disp: 90 tablet, Rfl: 0   Methylcobalamin (B12-ACTIVE PO), Take 1 tablet by mouth daily., Disp: , Rfl:    MILK THISTLE PO, Take by mouth., Disp: , Rfl:    Multiple  Vitamin (MULTIVITAMIN) tablet, Take 1 tablet by mouth daily., Disp: , Rfl:    pantoprazole  (PROTONIX ) 40 MG tablet, TAKE 1 TABLET(40 MG) BY MOUTH DAILY, Disp: 90 tablet, Rfl: 0   phentermine  (ADIPEX-P ) 37.5 MG tablet, Take 1 tablet (37.5 mg total) by mouth daily before breakfast., Disp: 30 tablet, Rfl: 2   VITAMIN D  PO, Take 1 capsule by mouth daily., Disp: , Rfl:    zinc gluconate 50 MG tablet, Take 50 mg by mouth daily., Disp: , Rfl:   "

## 2024-03-21 NOTE — Telephone Encounter (Signed)
 Copied from CRM [id number]. Topic: General - Other >> Mar 21, 2024  4:10 PM Tobias L wrote: Reason for CRM: Patient was signed into mychart video visit link but was unable to connect with office. Spoke to CAL, patient would have to come in person for visit as medicare is not covering virtual or tele visits.   Patient informed and states she would not like to come in due to the flu. Patient inquiring if something could be sent in for her without visit.

## 2024-03-22 ENCOUNTER — Other Ambulatory Visit: Payer: Self-pay | Admitting: Family Medicine

## 2024-03-22 ENCOUNTER — Other Ambulatory Visit: Payer: Self-pay

## 2024-03-22 DIAGNOSIS — R42 Dizziness and giddiness: Secondary | ICD-10-CM

## 2024-03-22 DIAGNOSIS — H6091 Unspecified otitis externa, right ear: Secondary | ICD-10-CM

## 2024-03-22 DIAGNOSIS — J329 Chronic sinusitis, unspecified: Secondary | ICD-10-CM

## 2024-03-22 MED ORDER — CEFDINIR 300 MG PO CAPS: 300.0000 mg | ORAL_CAPSULE | Freq: Two times a day (BID) | ORAL | 0 refills | Status: AC

## 2024-03-22 MED ORDER — MECLIZINE HCL 25 MG PO TABS: 25.0000 mg | ORAL_TABLET | Freq: Three times a day (TID) | ORAL | 0 refills | Status: AC | PRN

## 2024-04-12 ENCOUNTER — Telehealth: Payer: Self-pay

## 2024-04-12 DIAGNOSIS — D128 Benign neoplasm of rectum: Secondary | ICD-10-CM

## 2024-04-12 DIAGNOSIS — K7460 Unspecified cirrhosis of liver: Secondary | ICD-10-CM

## 2024-04-12 DIAGNOSIS — R748 Abnormal levels of other serum enzymes: Secondary | ICD-10-CM

## 2024-04-12 DIAGNOSIS — D123 Benign neoplasm of transverse colon: Secondary | ICD-10-CM

## 2024-04-12 NOTE — Telephone Encounter (Signed)
-----   Message from [facility] Oswin Griffith S sent at 03/01/2024 12:02 PM EST ----- Regarding: Future labs Order labs for 6-8 weeks: AFP  For 6 months order:  RUQ US  for cirrhosis, Labs; cbc, hepatic function panel, pt/ inr, AFP,

## 2024-11-05 ENCOUNTER — Ambulatory Visit: Admitting: Physician Assistant

## 2025-01-17 ENCOUNTER — Ambulatory Visit
